# Patient Record
Sex: Female | Born: 1965 | Race: Black or African American | Hispanic: No | Marital: Married | Smoking: Current some day smoker
Health system: Southern US, Community
[De-identification: ages and names within clinical notes are randomized; demographics above are authoritative.]

## PROBLEM LIST (undated history)

## (undated) DIAGNOSIS — E559 Vitamin D deficiency, unspecified: Secondary | ICD-10-CM

## (undated) DIAGNOSIS — F32A Depression, unspecified: Secondary | ICD-10-CM

## (undated) DIAGNOSIS — T7840XA Allergy, unspecified, initial encounter: Secondary | ICD-10-CM

## (undated) DIAGNOSIS — F419 Anxiety disorder, unspecified: Secondary | ICD-10-CM

## (undated) DIAGNOSIS — G43909 Migraine, unspecified, not intractable, without status migrainosus: Secondary | ICD-10-CM

## (undated) DIAGNOSIS — N029 Recurrent and persistent hematuria with unspecified morphologic changes: Secondary | ICD-10-CM

## (undated) DIAGNOSIS — J45909 Unspecified asthma, uncomplicated: Secondary | ICD-10-CM

## (undated) DIAGNOSIS — E785 Hyperlipidemia, unspecified: Secondary | ICD-10-CM

## (undated) DIAGNOSIS — I639 Cerebral infarction, unspecified: Secondary | ICD-10-CM

## (undated) HISTORY — PX: TONSILECTOMY/ADENOIDECTOMY WITH MYRINGOTOMY: SHX6125

## (undated) HISTORY — DX: Allergy, unspecified, initial encounter: T78.40XA

## (undated) HISTORY — DX: Migraine, unspecified, not intractable, without status migrainosus: G43.909

## (undated) HISTORY — DX: Anxiety disorder, unspecified: F41.9

## (undated) HISTORY — DX: Recurrent and persistent hematuria with unspecified morphologic changes: N02.9

## (undated) HISTORY — DX: Unspecified asthma, uncomplicated: J45.909

## (undated) HISTORY — DX: Vitamin D deficiency, unspecified: E55.9

## (undated) HISTORY — DX: Hyperlipidemia, unspecified: E78.5

## (undated) HISTORY — PX: POLYPECTOMY: SHX149

## (undated) HISTORY — PX: LEEP: SHX91

## (undated) HISTORY — DX: Depression, unspecified: F32.A

## (undated) HISTORY — DX: Cerebral infarction, unspecified: I63.9

---

## 1996-07-25 HISTORY — PX: ABDOMINAL HYSTERECTOMY: SHX81

## 2009-07-25 DIAGNOSIS — N029 Recurrent and persistent hematuria with unspecified morphologic changes: Secondary | ICD-10-CM

## 2009-07-25 HISTORY — DX: Recurrent and persistent hematuria with unspecified morphologic changes: N02.9

## 2010-04-23 ENCOUNTER — Other Ambulatory Visit: Admission: RE | Admit: 2010-04-23 | Discharge: 2010-04-23 | Payer: Self-pay | Admitting: Internal Medicine

## 2010-05-25 ENCOUNTER — Ambulatory Visit (HOSPITAL_COMMUNITY): Admission: RE | Admit: 2010-05-25 | Discharge: 2010-05-25 | Payer: Self-pay | Admitting: Internal Medicine

## 2010-05-25 IMAGING — MG MM DIGITAL SCREENING BILAT W/ CAD
4 series · 4 of 4 positions shown · non-contrast
Comparison: none

DG SCREEN MAMMOGRAM BILATERAL
Bilateral CC and MLO view(s) were taken.
Technologist: PETER.(PETER)(PETER)

DIGITAL SCREENING MAMMOGRAM WITH CAD:
There are scattered fibroglandular densities.  Possible abnormality is noted in the right axilla 
and palpable abnormality noted on screening exam.  Spot compression views and possibly sonography 
are recommended for further evaluation.  In the left breast, no masses or malignant type 
calcifications are identified.
Images were processed with CAD.

[R CC]
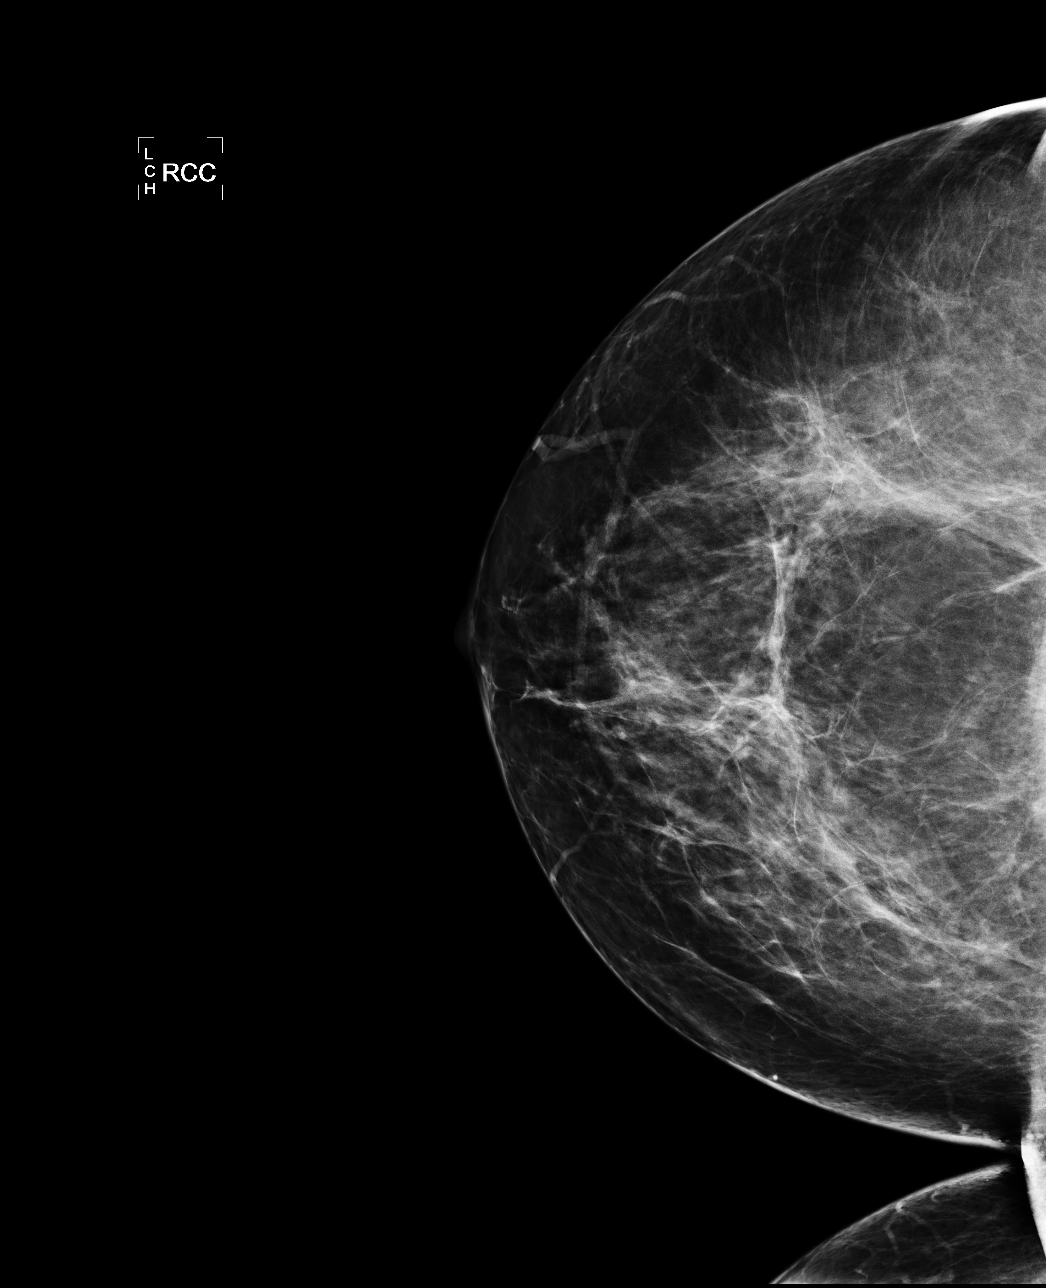

[R MLO]
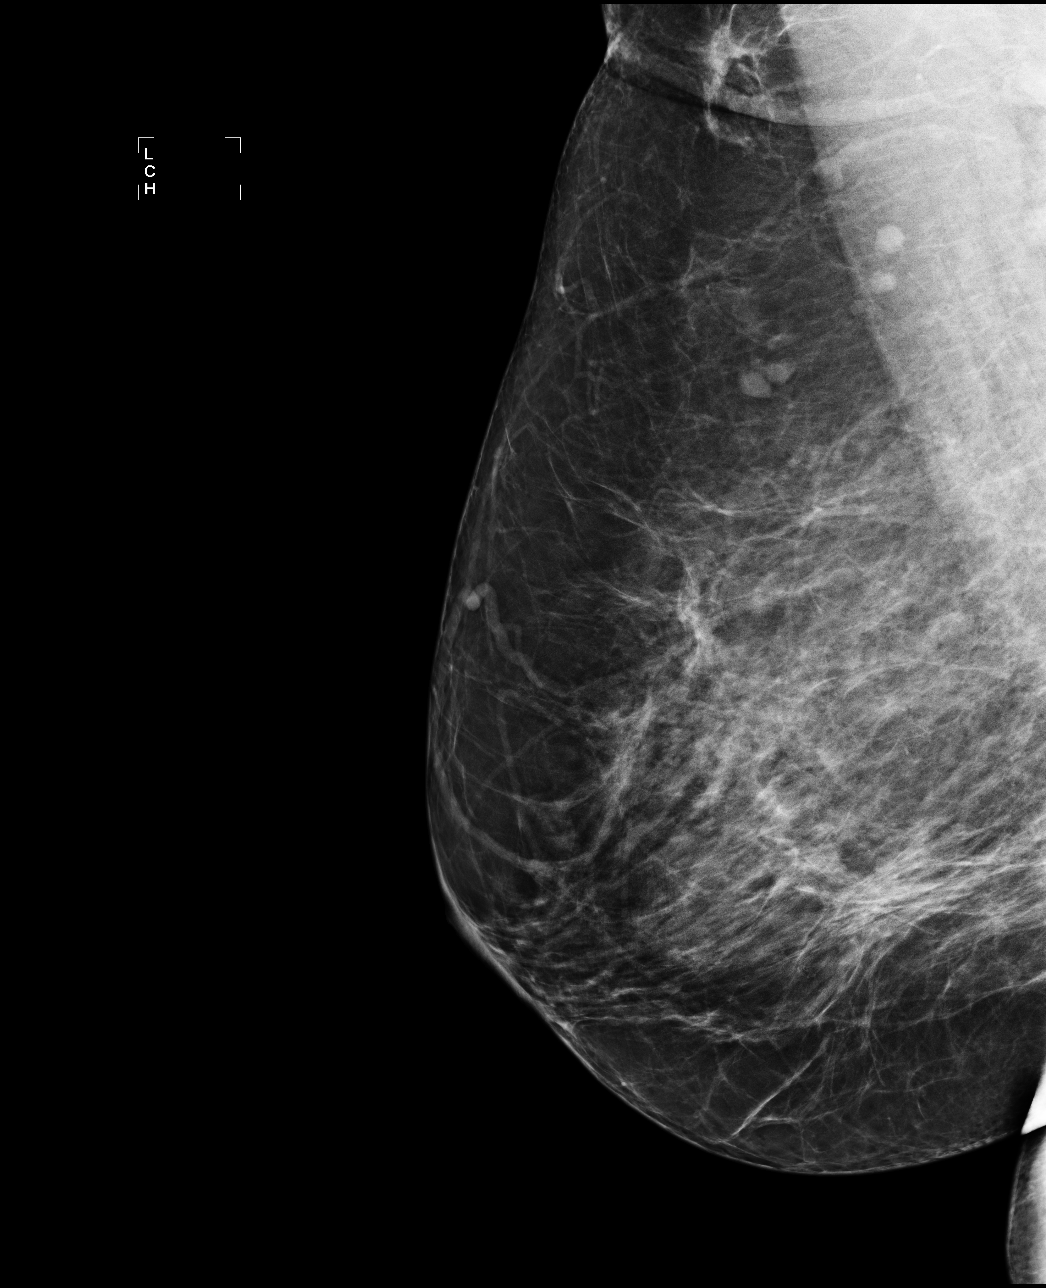

[L CC]
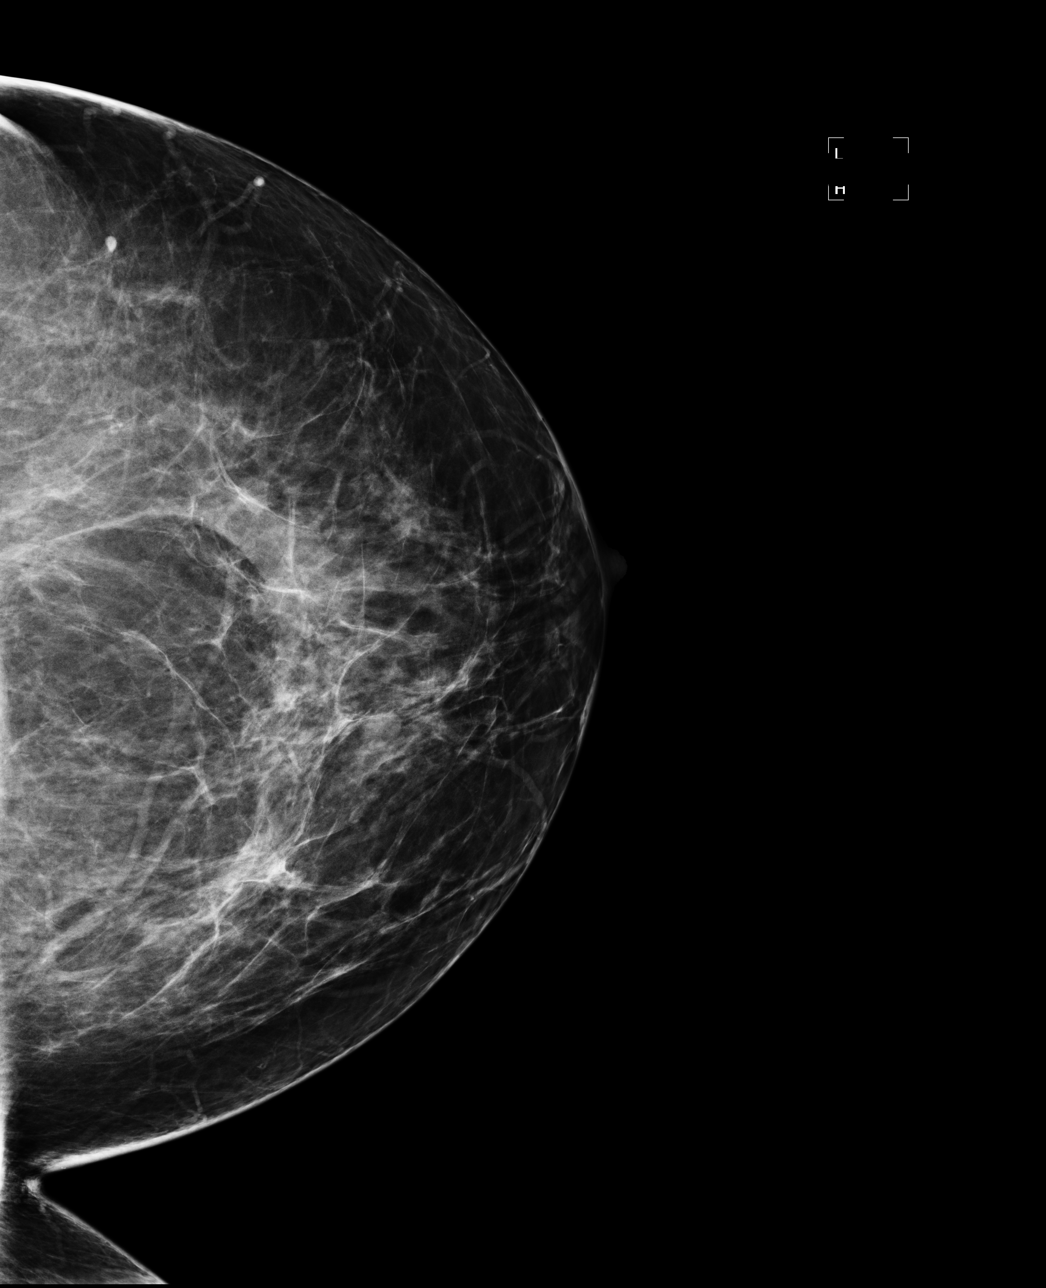

[L MLO]
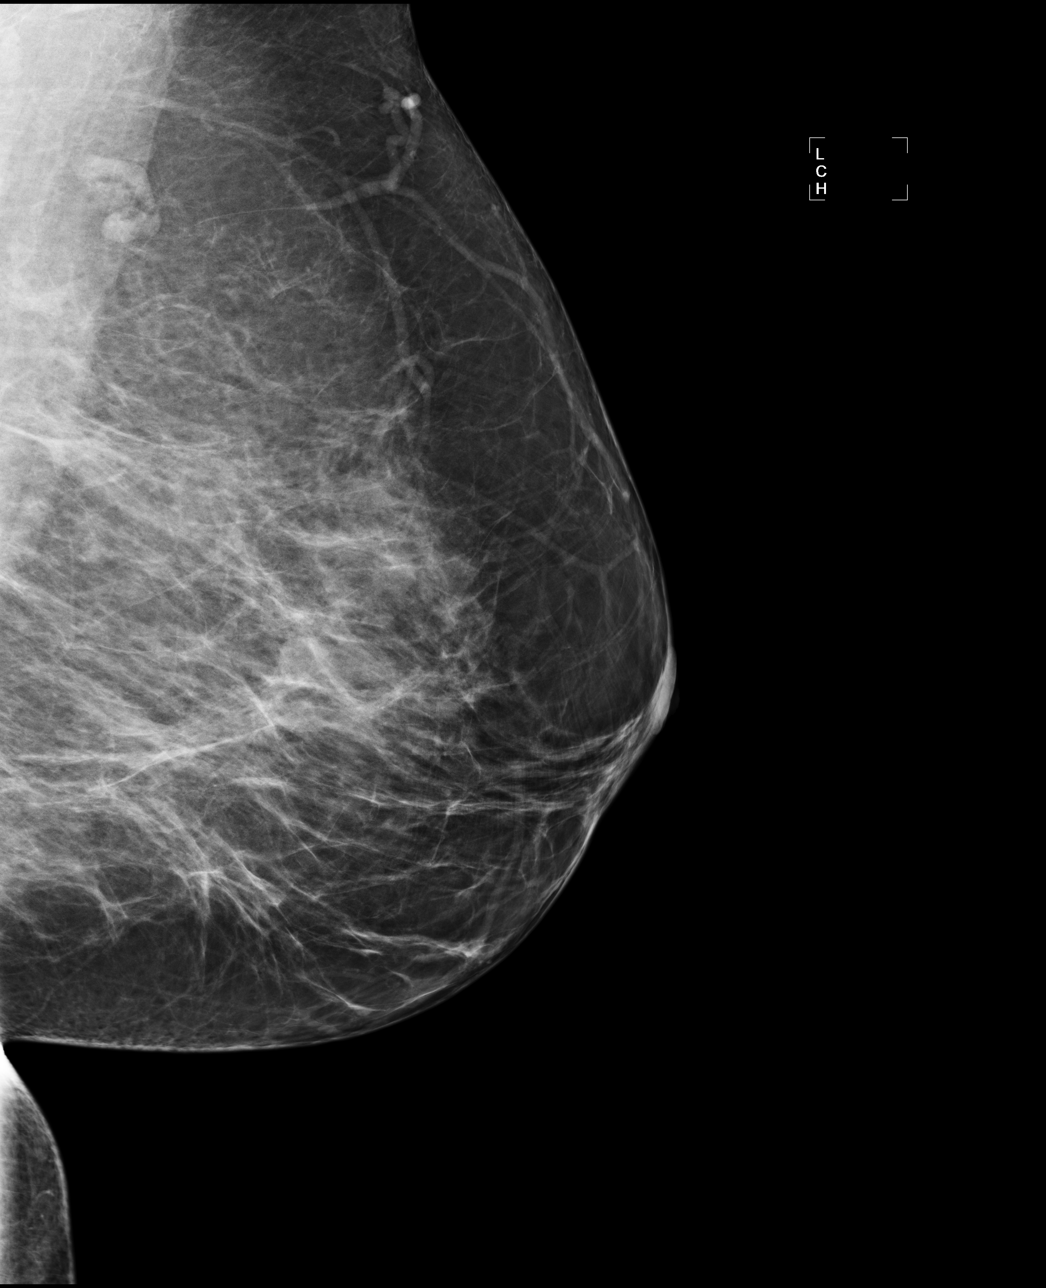

[4 of 4 positions shown; findings below may reference images not displayed]

IMPRESSION: Possible palpable abnormality in the right axilla.  Additional evaluation is indicated.  The 
patient will be contacted for additional studies and a supplementary report will follow.  No 
specific mammographic evidence of malignancy, left breast.

ASSESSMENT: Need additional imaging evaluation and/or prior mammograms for comparison - BI-RADS 0

Further imaging of the right breast.
,

## 2010-06-09 ENCOUNTER — Encounter: Admission: RE | Admit: 2010-06-09 | Discharge: 2010-06-09 | Payer: Self-pay | Admitting: Internal Medicine

## 2010-06-09 IMAGING — MG MM DIGITAL DIAGNOSTIC LIMITED*R*
1 series · 1 of 1 positions shown · non-contrast
Comparison: [DATE]

CLINICAL DATA: Screening call back, right axillary mass.  The
patient states she has had fullness and her right axilla for
multiple years.  She says the area becomes lumpy times.  She
reports no lump is palpated today.

DIGITAL DIAGNOSTIC RIGHT MAMMOGRAM WITHOUT CAD AND RIGHT BREAST
ULTRASOUND:

[R TAN]
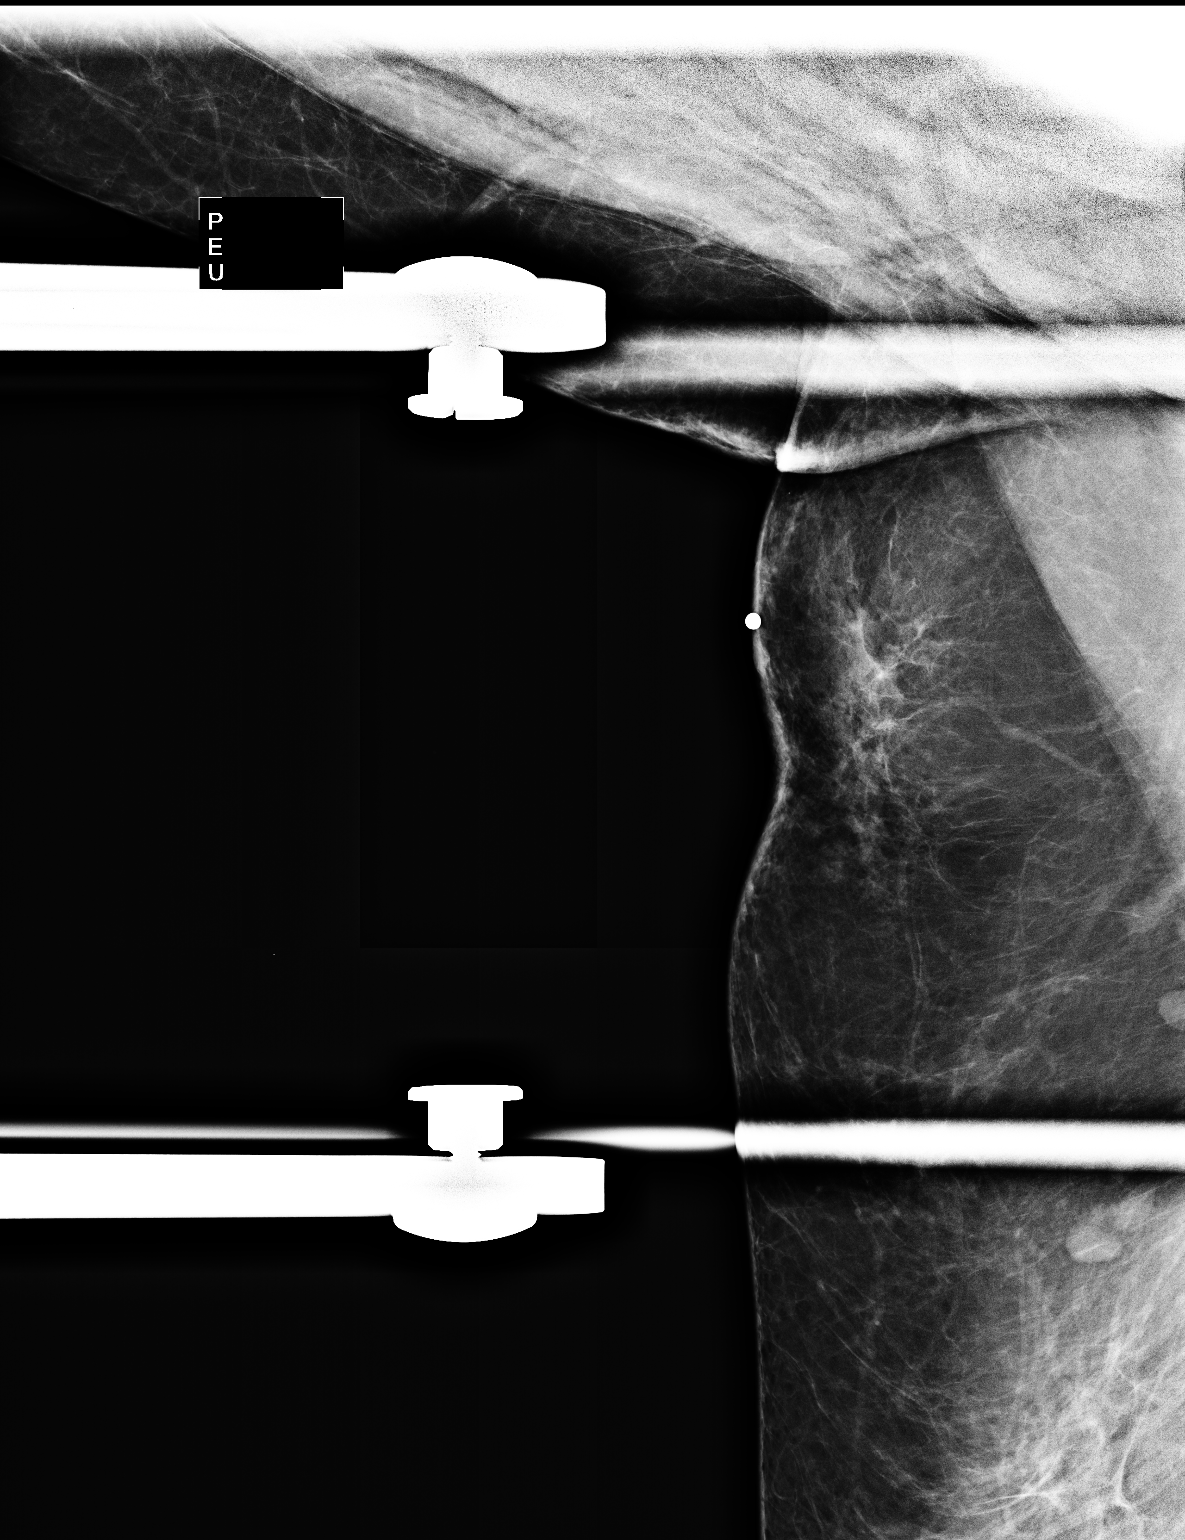

[1 of 1 positions shown; findings below may reference images not displayed]

FINDINGS: Spot compression views in the right axilla confirm the
presence of glandular tissue.  No mass is seen.

On physical exam, I see fullness in the right axilla.  No mass is
palpated in the right axilla.

Ultrasound is performed, showing accessory glandular tissue.
IMPRESSION: Accessory glandular tissue, right axilla.  Recommend routine
screening mammography in 1 year.

BI-RADS CATEGORY 2:  Benign finding(s).

## 2010-06-24 LAB — FECAL OCCULT BLOOD, GUAIAC: Fecal Occult Blood: NEGATIVE

## 2012-04-11 ENCOUNTER — Encounter (HOSPITAL_COMMUNITY): Payer: Self-pay | Admitting: Emergency Medicine

## 2012-04-11 ENCOUNTER — Emergency Department (HOSPITAL_COMMUNITY): Payer: No Typology Code available for payment source

## 2012-04-11 ENCOUNTER — Emergency Department (HOSPITAL_COMMUNITY)
Admission: EM | Admit: 2012-04-11 | Discharge: 2012-04-11 | Disposition: A | Discharge status: Home / Self Care | Payer: No Typology Code available for payment source | Attending: Emergency Medicine | Admitting: Emergency Medicine

## 2012-04-11 DIAGNOSIS — M542 Cervicalgia: Secondary | ICD-10-CM | POA: Insufficient documentation

## 2012-04-11 DIAGNOSIS — S0990XA Unspecified injury of head, initial encounter: Secondary | ICD-10-CM | POA: Insufficient documentation

## 2012-04-11 DIAGNOSIS — Y92410 Unspecified street and highway as the place of occurrence of the external cause: Secondary | ICD-10-CM | POA: Insufficient documentation
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
## 2012-04-11 MED ORDER — IBUPROFEN 800 MG PO TABS: 800.0000 mg | ORAL_TABLET | Freq: Three times a day (TID) | ORAL | Status: DC

## 2012-04-11 MED ORDER — IBUPROFEN 800 MG PO TABS
800.0000 mg | ORAL_TABLET | Freq: Once | ORAL | Status: AC
  Administered 2012-04-11: 800 mg via ORAL
  Filled 2012-04-11: qty 1
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
## 2012-04-11 NOTE — ED Notes (Signed)
Pt taken off backboard with MD, 2 RNs, and 2 NTs.
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
## 2012-04-11 NOTE — ED Notes (Signed)
Bed:WA12<BR> Expected date:<BR> Expected time:<BR> Means of arrival:<BR> Comments:<BR> ems 

## 2012-04-11 NOTE — ED Notes (Signed)
MD at bedside. Collar removed.

## 2012-06-14 ENCOUNTER — Other Ambulatory Visit (HOSPITAL_COMMUNITY)
Admission: RE | Admit: 2012-06-14 | Discharge: 2012-06-14 | Disposition: A | Discharge status: Home / Self Care | Payer: BC Managed Care – PPO | Source: Ambulatory Visit | Attending: Internal Medicine | Admitting: Internal Medicine

## 2012-06-14 ENCOUNTER — Other Ambulatory Visit: Payer: Self-pay | Admitting: Emergency Medicine

## 2012-06-14 DIAGNOSIS — Z01419 Encounter for gynecological examination (general) (routine) without abnormal findings: Secondary | ICD-10-CM | POA: Insufficient documentation

## 2013-05-27 ENCOUNTER — Other Ambulatory Visit: Payer: Self-pay | Admitting: Internal Medicine

## 2013-05-27 LAB — HEPATIC FUNCTION PANEL
AST: 12 U/L (ref 0–37)
Alkaline Phosphatase: 89 U/L (ref 39–117)
Bilirubin, Direct: 0.1 mg/dL (ref 0.0–0.3)
Indirect Bilirubin: 0.5 mg/dL (ref 0.0–0.9)

## 2013-05-27 LAB — CBC WITH DIFFERENTIAL/PLATELET
Eosinophils Absolute: 0.2 10*3/uL (ref 0.0–0.7)
Eosinophils Relative: 2 % (ref 0–5)
HCT: 39.9 % (ref 36.0–46.0)
Hemoglobin: 13.5 g/dL (ref 12.0–15.0)
Lymphs Abs: 3.8 10*3/uL (ref 0.7–4.0)
MCH: 28.6 pg (ref 26.0–34.0)
MCV: 84.5 fL (ref 78.0–100.0)
Monocytes Relative: 9 % (ref 3–12)
Platelets: 262 10*3/uL (ref 150–400)
RBC: 4.72 MIL/uL (ref 3.87–5.11)

## 2013-05-27 LAB — BASIC METABOLIC PANEL WITH GFR
CO2: 25 mEq/L (ref 19–32)
Calcium: 8.9 mg/dL (ref 8.4–10.5)
Creat: 0.65 mg/dL (ref 0.50–1.10)
GFR, Est Non African American: >89 mL/min
Glucose, Bld: 78 mg/dL (ref 70–99)
Potassium: 3.7 mEq/L (ref 3.5–5.3)

## 2013-05-27 LAB — LIPID PANEL
LDL Cholesterol: 151 mg/dL — ABNORMAL HIGH (ref 0–99)
VLDL: 16 mg/dL (ref 0–40)

## 2013-06-16 ENCOUNTER — Encounter: Payer: Self-pay | Admitting: Internal Medicine

## 2013-06-16 DIAGNOSIS — E559 Vitamin D deficiency, unspecified: Secondary | ICD-10-CM | POA: Insufficient documentation

## 2013-06-16 DIAGNOSIS — E785 Hyperlipidemia, unspecified: Secondary | ICD-10-CM | POA: Insufficient documentation

## 2013-06-16 DIAGNOSIS — G43909 Migraine, unspecified, not intractable, without status migrainosus: Secondary | ICD-10-CM | POA: Insufficient documentation

## 2013-06-16 DIAGNOSIS — F419 Anxiety disorder, unspecified: Secondary | ICD-10-CM | POA: Insufficient documentation

## 2013-06-16 DIAGNOSIS — J45909 Unspecified asthma, uncomplicated: Secondary | ICD-10-CM | POA: Insufficient documentation

## 2013-06-17 ENCOUNTER — Encounter: Payer: Self-pay | Admitting: Emergency Medicine

## 2013-08-19 ENCOUNTER — Other Ambulatory Visit: Payer: Self-pay | Admitting: Physician Assistant

## 2013-08-19 ENCOUNTER — Other Ambulatory Visit: Payer: Self-pay | Admitting: Emergency Medicine

## 2013-08-27 ENCOUNTER — Encounter: Payer: Self-pay | Admitting: Emergency Medicine

## 2013-08-27 ENCOUNTER — Ambulatory Visit (INDEPENDENT_AMBULATORY_CARE_PROVIDER_SITE_OTHER): Payer: BC Managed Care – PPO | Admitting: Emergency Medicine

## 2013-08-27 VITALS — BP 122/78 | HR 72 | Temp 98.0°F | Resp 18 | Ht 64.0 in | Wt 170.0 lb

## 2013-08-27 DIAGNOSIS — Z111 Encounter for screening for respiratory tuberculosis: Secondary | ICD-10-CM

## 2013-08-27 DIAGNOSIS — R5381 Other malaise: Secondary | ICD-10-CM

## 2013-08-27 DIAGNOSIS — Z0000 Encounter for general adult medical examination without abnormal findings: Secondary | ICD-10-CM

## 2013-08-27 DIAGNOSIS — R5383 Other fatigue: Secondary | ICD-10-CM

## 2013-08-27 DIAGNOSIS — Z79899 Other long term (current) drug therapy: Secondary | ICD-10-CM

## 2013-08-27 DIAGNOSIS — Z1212 Encounter for screening for malignant neoplasm of rectum: Secondary | ICD-10-CM

## 2013-08-27 DIAGNOSIS — Z23 Encounter for immunization: Secondary | ICD-10-CM

## 2013-08-27 DIAGNOSIS — E559 Vitamin D deficiency, unspecified: Secondary | ICD-10-CM

## 2013-08-27 LAB — HEMOGLOBIN A1C
Hgb A1c MFr Bld: 5.5 % (ref <5.7)
Mean Plasma Glucose: 111 mg/dL (ref <117)

## 2013-08-27 LAB — CBC WITH DIFFERENTIAL/PLATELET
Basophils Absolute: 0 10*3/uL (ref 0.0–0.1)
Basophils Relative: 1 % (ref 0–1)
EOS PCT: 1 % (ref 0–5)
Eosinophils Absolute: 0.1 10*3/uL (ref 0.0–0.7)
HEMATOCRIT: 40 % (ref 36.0–46.0)
Hemoglobin: 13.8 g/dL (ref 12.0–15.0)
LYMPHS ABS: 2 10*3/uL (ref 0.7–4.0)
Lymphocytes Relative: 32 % (ref 12–46)
MCH: 29 pg (ref 26.0–34.0)
MCHC: 34.5 g/dL (ref 30.0–36.0)
MCV: 84 fL (ref 78.0–100.0)
MONO ABS: 0.7 10*3/uL (ref 0.1–1.0)
Monocytes Relative: 11 % (ref 3–12)
Neutro Abs: 3.5 10*3/uL (ref 1.7–7.7)
Neutrophils Relative %: 55 % (ref 43–77)
PLATELETS: 243 10*3/uL (ref 150–400)
RBC: 4.76 MIL/uL (ref 3.87–5.11)
RDW: 13 % (ref 11.5–15.5)
WBC: 6.4 10*3/uL (ref 4.0–10.5)

## 2013-08-27 MED ORDER — ALPRAZOLAM 0.25 MG PO TABS: 0.2500 mg | ORAL_TABLET | Freq: Two times a day (BID) | ORAL | Status: DC | PRN

## 2013-08-27 NOTE — Patient Instructions (Addendum)
Cough, Adult  A cough is a reflex. It helps you clear your throat and airways. A cough can help heal your body. A cough can last 2 or 3 weeks (acute) or may last more than 8 weeks (chronic). Some common causes of a cough can include an infection, allergy, or a cold. HOME CARE  Only take medicine as told by your doctor.  If given, take your medicines (antibiotics) as told. Finish them even if you start to feel better.  Use a cold steam vaporizer or humidier in your home. This can help loosen thick spit (secretions).  Sleep so you are almost sitting up (semi-upright). Use pillows to do this. This helps reduce coughing.  Rest as needed.  Stop smoking if you smoke. GET HELP RIGHT AWAY IF:  You have yellowish-white fluid (pus) in your thick spit.  Your cough gets worse.  Your medicine does not reduce coughing, and you are losing sleep.  You cough up blood.  You have trouble breathing.  Your pain gets worse and medicine does not help.  You have a fever. MAKE SURE YOU:   Understand these instructions.  Will watch your condition.  Will get help right away if you are not doing well or get worse. Document Released: 03/24/2011 Document Revised: 10/03/2011 Document Reviewed: 03/24/2011 [facility] Patient Information 2014 [place].  Pneumococcal Vaccine, Polyvalent solution for injection What is this medicine? PNEUMOCOCCAL VACCINE, POLYVALENT (NEU mo KOK al vak SEEN, pol ee VEY luhnt) is a vaccine to prevent pneumococcus bacteria infection. These bacteria are a major cause of ear infections, Strep throat infections, and serious pneumonia, meningitis, or blood infections worldwide. These vaccines help the body to produce antibodies (protective substances) that help your body defend against these bacteria. This vaccine is recommended for people 13 years of age and older with health problems. It is also recommended for all adults over 51 years old. This vaccine will not treat an  infection. This medicine may be used for other purposes; ask your health care provider or pharmacist if you have questions. COMMON BRAND NAME(S): Pneumovax 23 What should I tell my health care provider before I take this medicine? They need to know if you have any of these conditions: -bleeding problems -bone marrow or organ transplant -cancer, Hodgkin's disease -fever -infection -immune system problems -low platelet count in the blood -seizures -an unusual or allergic reaction to pneumococcal vaccine, diphtheria toxoid, other vaccines, latex, other medicines, foods, dyes, or preservatives -pregnant or trying to get pregnant -breast-feeding How should I use this medicine? This vaccine is for injection into a muscle or under the skin. It is given by a health care professional. A copy of Vaccine Information Statements will be given before each vaccination. Read this sheet carefully each time. The sheet may change frequently. Talk to your pediatrician regarding the use of this medicine in children. While this drug may be prescribed for children as young as 21 years of age for selected conditions, precautions do apply. Overdosage: If you think you have taken too much of this medicine contact a poison control center or emergency room at once. NOTE: This medicine is only for you. Do not share this medicine with others. What if I miss a dose? It is important not to miss your dose. Call your doctor or health care professional if you are unable to keep an appointment. What may interact with this medicine? -medicines for cancer chemotherapy -medicines that suppress your immune function -medicines that treat or prevent blood clots like  warfarin, enoxaparin, and dalteparin -steroid medicines like prednisone or cortisone This list may not describe all possible interactions. Give your health care provider a list of all the medicines, herbs, non-prescription drugs, or dietary supplements you use. Also  tell them if you smoke, drink alcohol, or use illegal drugs. Some items may interact with your medicine. What should I watch for while using this medicine? Mild fever and pain should go away in 3 days or less. Report any unusual symptoms to your doctor or health care professional. What side effects may I notice from receiving this medicine? Side effects that you should report to your doctor or health care professional as soon as possible: -allergic reactions like skin rash, itching or hives, swelling of the face, lips, or tongue -breathing problems -confused -fever over 102 degrees F -pain, tingling, numbness in the hands or feet -seizures -unusual bleeding or bruising -unusual muscle weakness Side effects that usually do not require medical attention (report to your doctor or health care professional if they continue or are bothersome): -aches and pains -diarrhea -fever of 102 degrees F or less -headache -irritable -loss of appetite -pain, tender at site where injected -trouble sleeping This list may not describe all possible side effects. Call your doctor for medical advice about side effects. You may report side effects to FDA at 1-800-FDA-1088. Where should I keep my medicine? This does not apply. This vaccine is given in a clinic, pharmacy, doctor's office, or other health care setting and will not be stored at home. NOTE: This sheet is a summary. It may not cover all possible information. If you have questions about this medicine, talk to your doctor, pharmacist, or health care provider.  2014, [employer]vier/Gold Standard. (2008-02-15 14:32:37) Insomnia Insomnia is frequent trouble falling and/or staying asleep. Insomnia can be a long term problem or a short term problem. Both are common. Insomnia can be a short term problem when the wakefulness is related to a certain stress or worry. Long term insomnia is often related to ongoing stress during waking hours and/or poor sleeping habits.  Overtime, sleep deprivation itself can make the problem worse. Every little thing feels more severe because you are overtired and your ability to cope is decreased. CAUSES   Stress, anxiety, and depression.  Poor sleeping habits.  Distractions such as TV in the bedroom.  Naps close to bedtime.  Engaging in emotionally charged conversations before bed.  Technical reading before sleep.  Alcohol and other sedatives. They may make the problem worse. They can hurt normal sleep patterns and normal dream activity.  Stimulants such as caffeine for several hours prior to bedtime.  Pain syndromes and shortness of breath can cause insomnia.  Exercise late at night.  Changing time zones may cause sleeping problems (jet lag). It is sometimes helpful to have someone observe your sleeping patterns. They should look for periods of not breathing during the night (sleep apnea). They should also look to see how long those periods last. If you live alone or observers are uncertain, you can also be observed at a sleep clinic where your sleep patterns will be professionally monitored. Sleep apnea requires a checkup and treatment. Give your caregivers your medical history. Give your caregivers observations your family has made about your sleep.  SYMPTOMS   Not feeling rested in the morning.  Anxiety and restlessness at bedtime.  Difficulty falling and staying asleep. TREATMENT   Your caregiver may prescribe treatment for an underlying medical disorders. Your caregiver can give advice or  help if you are using alcohol or other drugs for self-medication. Treatment of underlying problems will usually eliminate insomnia problems.  Medications can be prescribed for short time use. They are generally not recommended for lengthy use.  Over-the-counter sleep medicines are not recommended for lengthy use. They can be habit forming.  You can promote easier sleeping by making lifestyle changes such as:  Using  relaxation techniques that help with breathing and reduce muscle tension.  Exercising earlier in the day.  Changing your diet and the time of your last meal. No night time snacks.  Establish a regular time to go to bed.  Counseling can help with stressful problems and worry.  Soothing music and white noise may be helpful if there are background noises you cannot remove.  Stop tedious detailed work at least one hour before bedtime. HOME CARE INSTRUCTIONS   Keep a diary. Inform your caregiver about your progress. This includes any medication side effects. See your caregiver regularly. Take note of:  Times when you are asleep.  Times when you are awake during the night.  The quality of your sleep.  How you feel the next day. This information will help your caregiver care for you.  Get out of bed if you are still awake after 15 minutes. Read or do some quiet activity. Keep the lights down. Wait until you feel sleepy and go back to bed.  Keep regular sleeping and waking hours. Avoid naps.  Exercise regularly.  Avoid distractions at bedtime. Distractions include watching television or engaging in any intense or detailed activity like attempting to balance the household checkbook.  Develop a bedtime ritual. Keep a familiar routine of bathing, brushing your teeth, climbing into bed at the same time each night, listening to soothing music. Routines increase the success of falling to sleep faster.  Use relaxation techniques. This can be using breathing and muscle tension release routines. It can also include visualizing peaceful scenes. You can also help control troubling or intruding thoughts by keeping your mind occupied with boring or repetitive thoughts like the old concept of counting sheep. You can make it more creative like imagining planting one beautiful flower after another in your backyard garden.  During your day, work to eliminate stress. When this is not possible use some of  the previous suggestions to help reduce the anxiety that accompanies stressful situations. MAKE SURE YOU:   Understand these instructions.  Will watch your condition.  Will get help right away if you are not doing well or get worse. Document Released: 07/08/2000 Document Revised: 10/03/2011 Document Reviewed: 08/08/2007 [facility] Patient Information 2014 [place].

## 2013-08-27 NOTE — Progress Notes (Signed)
Subjective:    Patient ID: Kimberly Zhang, female    DOB: 01/27/1966, 48 y.o.   MRN: [id number]  HPI Comments: 48 yo female CPE She is eating healthy, she is eating small portions. She is active. She has been trying to lose weight with short trial of Phentermine x 3 months but has not seen a lot of success. 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 She has had negative workup per Dr.Ottelin in past for Hematuria  And denies any current symptoms.   She has had dry cough x couple weeks with sick babies. She notes mild drainage all clear. She has not tried any OTC.  She is not sleeping well. She wakes 3a.m. and cannot go back to sleep. She notes this has been happening for a long time. She notes mild fatigue and thinks this is the reason. She notes migraines have been better with Topamax and does not think they come from lack of sleep.   She notes anxiety is controlled even though she is mildly stress. She only uses Xanax PRN.   Hyperlipidemia  Anxiety Symptoms include nervous/anxious behavior.     Current Outpatient Prescriptions on File Prior to Visit  Medication Sig Dispense Refill  . Biotin 10 MG CAPS Take by mouth daily.      . Magnesium 500 MG CAPS Take by mouth daily.      . phentermine 37.5 MG capsule Take 37.5 mg by mouth daily.      . rosuvastatin (CRESTOR) 10 MG tablet Take 10 mg by mouth daily. Takes 1/2 daily = 5 mg      . SUMAtriptan (IMITREX) 25 MG tablet Take 25 mg by mouth every 2 (two) hours as needed for migraine or headache. May repeat in 2 hours if headache persists or recurs.      . topiramate (TOPAMAX) 25 MG capsule Take 25 mg by mouth 2 (two) times daily.      Marland Kitchen topiramate (TOPAMAX) 25 MG tablet TAKE 1 TABLET BY MOUTH TWICE DAILY  180 tablet  1  . Vitamin D,  Cholecalciferol, 1000 UNITS TABS Take by mouth daily.      Marland Kitchen albuterol (PROVENTIL HFA;VENTOLIN HFA) 108 (90 BASE) MCG/ACT inhaler Inhale 2 puffs into the lungs every 6 (six) hours as needed.      Marland Kitchen ibuprofen (ADVIL,MOTRIN) 200 MG tablet Take 600 mg by mouth every 6 (six) hours as needed. For pain      . ibuprofen (ADVIL,MOTRIN) 800 MG tablet Take 1 tablet (800 mg total) by mouth 3 (three) times daily.  21 tablet  0  . Multiple Vitamin (MULTIVITAMIN WITH MINERALS) TABS Take 1 tablet by mouth daily.       No current facility-administered medications on file prior to visit.   Allergies  Allergen Reactions  . Shellfish Allergy Anaphylaxis   Past Medical History  Diagnosis Date  . Hyperlipidemia   . Anxiety   . Asthma   . Migraine   . Vitamin D deficiency    Past Surgical History  Procedure Laterality Date  . Leep    . Abdominal hysterectomy  1998  . Tonsilectomy/adenoidectomy with myringotomy     History  Substance Use Topics  . Smoking status: Current Some  Day Smoker  . Smokeless tobacco: Not on file  . Alcohol Use: Not on file   Family History  Problem Relation Age of Onset  . Hyperlipidemia Mother   . Diabetes Paternal Grandmother   . Heart disease Maternal Aunt   . Cancer Maternal Aunt   . Cancer Maternal Uncle   . Depression Other   . Alzheimer's disease Other        Review of Systems  Constitutional: Positive for fatigue.  HENT: Positive for postnasal drip.   Eyes:       [facility] 2014 WNL  Respiratory: Positive for cough.   Genitourinary:       Ottelin NEG workup for hematuria in the past 1st PREG 22 1st Menses 13-14 ANB PAP at age 76  Psychiatric/Behavioral: Positive for sleep disturbance. The patient is nervous/anxious.        Counseling [facility] PRN  All other systems reviewed and are negative.   BP 122/78  Pulse 72  Temp(Src) 98 F (36.7 C) (Temporal)  Resp 18  Ht 5\' 4"  (1.626 m)  Wt 170 lb (77.111 kg)  BMI 29.17  kg/m2     Objective:   Physical Exam  Nursing note and vitals reviewed. Constitutional: She is oriented to person, place, and time. She appears well-developed and well-nourished. No distress.  HENT:  Head: Normocephalic and atraumatic.  Right Ear: External ear normal.  Left Ear: External ear normal.  Nose: Nose normal.  Mouth/Throat: Oropharynx is clear and moist. No oropharyngeal exudate.  Cloudy TM's bilaterally    Eyes: Conjunctivae and EOM are normal. Pupils are equal, round, and reactive to light. Right eye exhibits no discharge. Left eye exhibits no discharge. No scleral icterus.  Neck: Normal range of motion. Neck supple. No JVD present. No tracheal deviation present. No thyromegaly present.  Cardiovascular: Normal rate, regular rhythm, normal heart sounds and intact distal pulses.   Pulmonary/Chest: Effort normal and breath sounds normal.  Abdominal: Soft. Bowel sounds are normal. She exhibits no distension and no mass. There is no tenderness. There is no rebound and no guarding.  Genitourinary:  Breasts WNL GYN def til 2016  Musculoskeletal: Normal range of motion. She exhibits no edema and no tenderness.  Lymphadenopathy:    She has no cervical adenopathy.  Neurological: She is alert and oriented to person, place, and time. She has normal reflexes. She exhibits normal muscle tone. Coordination normal.  Skin: Skin is warm and dry. No rash noted. No erythema. No pallor.  Psychiatric: She has a normal mood and affect. Her behavior is normal. Judgment and thought content normal.      EKG WNL NSCSPT    Assessment & Plan:  1. CPE- Update screening labs/ History/ Immunizations/ Testing as needed. Advised healthy diet, QD exercise, increase H20 and continue RX/ Vitamins AD. Advised needs to update mammogram 2. Insomnia vs Fatigue- check labs, increase activity and H2O, sleep hygiene discussed, consider sleep study if no improvement 3. Cholesterol/ Vit D HX- recheck labs 4.  Anxiety- controlled, continue same, advised increase activity level 5. Mildly overweight- D/c Phentermine, continue to improve diet/ exercise 6. Cough vs Allergic rhinitis- Allegra OTC, increase H2o, allergy hygiene explained, check labs

## 2013-08-28 LAB — HEPATIC FUNCTION PANEL
ALT: 14 U/L (ref 0–35)
AST: 13 U/L (ref 0–37)
Albumin: 3.6 g/dL (ref 3.5–5.2)
Alkaline Phosphatase: 79 U/L (ref 39–117)
Bilirubin, Direct: 0.1 mg/dL (ref 0.0–0.3)
Indirect Bilirubin: 0.3 mg/dL (ref 0.2–1.2)
TOTAL PROTEIN: 5.9 g/dL — AB (ref 6.0–8.3)
Total Bilirubin: 0.4 mg/dL (ref 0.2–1.2)

## 2013-08-28 LAB — VITAMIN B12: VITAMIN B 12: 374 pg/mL (ref 211–911)

## 2013-08-28 LAB — LIPID PANEL
CHOLESTEROL: 186 mg/dL (ref 0–200)
HDL: 34 mg/dL — ABNORMAL LOW (ref >39)
LDL Cholesterol: 127 mg/dL — ABNORMAL HIGH (ref 0–99)
TRIGLYCERIDES: 127 mg/dL (ref <150)
Total CHOL/HDL Ratio: 5.5 Ratio
VLDL: 25 mg/dL (ref 0–40)

## 2013-08-28 LAB — FOLATE RBC: RBC FOLATE: 536 ng/mL (ref >280)

## 2013-08-28 LAB — URINALYSIS, ROUTINE W REFLEX MICROSCOPIC
Bilirubin Urine: NEGATIVE
Glucose, UA: NEGATIVE mg/dL
Hgb urine dipstick: NEGATIVE
KETONES UR: NEGATIVE mg/dL
Leukocytes, UA: NEGATIVE
Nitrite: NEGATIVE
Protein, ur: NEGATIVE mg/dL
SPECIFIC GRAVITY, URINE: 1.022 (ref 1.005–1.030)
UROBILINOGEN UA: 1 mg/dL (ref 0.0–1.0)
pH: 8 (ref 5.0–8.0)

## 2013-08-28 LAB — IRON AND TIBC
%SAT: 42 % (ref 20–55)
IRON: 121 ug/dL (ref 42–145)
TIBC: 289 ug/dL (ref 250–470)
UIBC: 168 ug/dL (ref 125–400)

## 2013-08-28 LAB — BASIC METABOLIC PANEL WITH GFR
BUN: 6 mg/dL (ref 6–23)
CHLORIDE: 103 meq/L (ref 96–112)
CO2: 26 meq/L (ref 19–32)
CREATININE: 0.71 mg/dL (ref 0.50–1.10)
Calcium: 8.7 mg/dL (ref 8.4–10.5)
GFR, Est African American: >89 mL/min
GFR, Est Non African American: >89 mL/min
Glucose, Bld: 79 mg/dL (ref 70–99)
Potassium: 3.8 mEq/L (ref 3.5–5.3)
Sodium: 139 mEq/L (ref 135–145)

## 2013-08-28 LAB — MAGNESIUM: Magnesium: 1.8 mg/dL (ref 1.5–2.5)

## 2013-08-28 LAB — VITAMIN D 25 HYDROXY (VIT D DEFICIENCY, FRACTURES): Vit D, 25-Hydroxy: 40 ng/mL (ref 30–89)

## 2013-08-28 LAB — TSH: TSH: 0.77 u[IU]/mL (ref 0.350–4.500)

## 2013-08-28 LAB — MICROALBUMIN / CREATININE URINE RATIO
CREATININE, URINE: 187.1 mg/dL
MICROALB UR: 1.12 mg/dL (ref 0.00–1.89)
Microalb Creat Ratio: 6 mg/g (ref 0.0–30.0)

## 2013-08-28 LAB — INSULIN, FASTING: Insulin fasting, serum: 7 u[IU]/mL (ref 3–28)

## 2013-08-29 ENCOUNTER — Ambulatory Visit: Payer: Self-pay

## 2013-08-29 ENCOUNTER — Ambulatory Visit (INDEPENDENT_AMBULATORY_CARE_PROVIDER_SITE_OTHER): Payer: BC Managed Care – PPO | Admitting: Emergency Medicine

## 2013-08-29 DIAGNOSIS — L0291 Cutaneous abscess, unspecified: Secondary | ICD-10-CM

## 2013-08-29 DIAGNOSIS — L0390 Cellulitis, unspecified: Secondary | ICD-10-CM

## 2013-08-29 MED ORDER — CEFTRIAXONE SODIUM 1 G IJ SOLR
1.0000 g | Freq: Once | INTRAMUSCULAR | Status: AC
  Administered 2013-08-29: 1 g via INTRAMUSCULAR

## 2013-08-29 MED ORDER — DEXAMETHASONE SODIUM PHOSPHATE 100 MG/10ML IJ SOLN
10.0000 mg | Freq: Once | INTRAMUSCULAR | Status: AC
  Administered 2013-08-29: 10 mg via INTRAMUSCULAR

## 2013-08-29 MED ORDER — PROMETHAZINE-CODEINE 6.25-10 MG/5ML PO SYRP
5.0000 mL | ORAL_SOLUTION | Freq: Three times a day (TID) | ORAL | Status: DC | PRN
Start: 2013-08-29 — End: 2014-09-23

## 2013-08-29 MED ORDER — DOXYCYCLINE HYCLATE 100 MG PO TABS: 100.0000 mg | ORAL_TABLET | Freq: Two times a day (BID) | ORAL | Status: DC

## 2013-08-29 NOTE — Patient Instructions (Signed)
Spider Bite Most spider bites do not cause serious problems. HOME CARE  Do not scratch the bite.  Keep the bite clean and dry. Wash the bite with soap and water as told by your doctor.  Put ice on the bite.  Put ice in a plastic bag.  Place a towel between your skin and the bag.  Leave the ice on for 20 minutes. Do this 4 times a day for the first 2 to 3 days or as told by your doctor.  Raise (elevate) the bite above your heart.  Only take medicine as told by your doctor.  If you are given medicines (antibiotics), take them as told. Finish them even if you start to feel better. You may need a tetanus shot if:  You cannot remember when you had your last tetanus shot.  You have never had a tetanus shot.  The bite broke your skin. If you need a tetanus shot and you choose not to have one, you may get tetanus. Sickness from tetanus can be serious. GET HELP RIGHT AWAY IF:  Your bite turns purple.  Your bite gets more puffy (swollen), painful, or red.  You are short of breath or have chest pain.  You have muscle cramps or painful muscle spasms.  You have belly (abdominal) pain.  You feel sick to your stomach (nauseous) or throw up (vomit).  You feel very tired or sleepy.  Your bite is not better after 3 days of treatment. MAKE SURE YOU:  Understand these instructions.  Will watch your condition.  Will get help right away if you are not doing well or get worse. Document Released: 08/13/2010 Document Revised: 10/03/2011 Document Reviewed: 02/09/2011 [facility] Patient Information 2014 [place], [place]. Cellulitis Cellulitis is an infection of the skin and the tissue under the skin. The infected area is usually red and tender. This happens most often in the arms and lower legs. HOME CARE   Take your antibiotic medicine as told. Finish the medicine even if you start to feel better.  Keep the infected arm or leg raised (elevated).  Put a warm cloth on the area up to 4  times per day.  Only take medicines as told by your doctor.  Keep all doctor visits as told. GET HELP RIGHT AWAY IF:   You have a fever.  You feel very sleepy.  You throw up (vomit) or have watery poop (diarrhea).  You feel sick and have muscle aches and pains.  You see red streaks on the skin coming from the infected area.  Your red area gets bigger or turns a dark color.  Your bone or joint under the infected area is painful after the skin heals.  Your infection comes back in the same area or different area.  You have a puffy (swollen) bump in the infected area.  You have new symptoms. MAKE SURE YOU:   Understand these instructions.  Will watch your condition.  Will get help right away if you are not doing well or get worse. Document Released: 12/28/2007 Document Revised: 01/10/2012 Document Reviewed: 09/26/2011 [facility] Patient Information 2014 [place], [place].

## 2013-08-29 NOTE — Progress Notes (Signed)
Patient ID: Kimberly Zhang, female   DOB: 10/20/1965, 48 y.o.   MRN: [id number]  Patient had Pneumovax in right arm yesterday and she was also exposed to sick kid in dirty house. She has been feeling nauseated/ feverish and fatigued since yesterday evening. She noted redness and swelling around vaccine area this morning.   On exam 2x3 inch area red/ warm/ tender/ edema. Away from immunization dime size area appears to have circular ring with 2 small dots in center  Concern for reaction vs spider bite.  Rocephin 1 GM and Dexamethasone 10 mg given/ Doxy 100 BID sent in. w/c if SX increase or ER.  Warm moist compresses to area

## 2013-08-30 LAB — TB SKIN TEST
Induration: 0 mm
TB SKIN TEST: NEGATIVE

## 2013-09-27 ENCOUNTER — Other Ambulatory Visit (INDEPENDENT_AMBULATORY_CARE_PROVIDER_SITE_OTHER): Payer: BC Managed Care – PPO

## 2013-09-27 DIAGNOSIS — Z1212 Encounter for screening for malignant neoplasm of rectum: Secondary | ICD-10-CM

## 2013-09-27 LAB — POC HEMOCCULT BLD/STL (HOME/3-CARD/SCREEN)
Card #3 Fecal Occult Blood, POC: NEGATIVE
FECAL OCCULT BLD: NEGATIVE
Fecal Occult Blood, POC: NEGATIVE
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
## 2014-06-24 ENCOUNTER — Encounter: Payer: Self-pay | Admitting: Emergency Medicine

## 2014-08-27 ENCOUNTER — Encounter: Payer: Self-pay | Admitting: Emergency Medicine

## 2014-09-23 ENCOUNTER — Ambulatory Visit (INDEPENDENT_AMBULATORY_CARE_PROVIDER_SITE_OTHER): Payer: BC Managed Care – PPO | Admitting: Emergency Medicine

## 2014-09-23 ENCOUNTER — Encounter: Payer: Self-pay | Admitting: Emergency Medicine

## 2014-09-23 VITALS — BP 122/84 | HR 68 | Temp 98.0°F | Resp 18 | Ht 63.0 in | Wt 182.0 lb

## 2014-09-23 DIAGNOSIS — R059 Cough, unspecified: Secondary | ICD-10-CM

## 2014-09-23 DIAGNOSIS — Z79899 Other long term (current) drug therapy: Secondary | ICD-10-CM

## 2014-09-23 DIAGNOSIS — Z0000 Encounter for general adult medical examination without abnormal findings: Secondary | ICD-10-CM

## 2014-09-23 DIAGNOSIS — R6889 Other general symptoms and signs: Secondary | ICD-10-CM

## 2014-09-23 DIAGNOSIS — E559 Vitamin D deficiency, unspecified: Secondary | ICD-10-CM

## 2014-09-23 DIAGNOSIS — R05 Cough: Secondary | ICD-10-CM

## 2014-09-23 DIAGNOSIS — E785 Hyperlipidemia, unspecified: Secondary | ICD-10-CM

## 2014-09-23 DIAGNOSIS — Z0001 Encounter for general adult medical examination with abnormal findings: Secondary | ICD-10-CM

## 2014-09-23 DIAGNOSIS — N029 Recurrent and persistent hematuria with unspecified morphologic changes: Secondary | ICD-10-CM | POA: Insufficient documentation

## 2014-09-23 DIAGNOSIS — Z111 Encounter for screening for respiratory tuberculosis: Secondary | ICD-10-CM

## 2014-09-23 DIAGNOSIS — I10 Essential (primary) hypertension: Secondary | ICD-10-CM

## 2014-09-23 DIAGNOSIS — Z1212 Encounter for screening for malignant neoplasm of rectum: Secondary | ICD-10-CM

## 2014-09-23 LAB — CBC WITH DIFFERENTIAL/PLATELET
BASOS ABS: 0 10*3/uL (ref 0.0–0.1)
Basophils Relative: 0 % (ref 0–1)
EOS ABS: 0.2 10*3/uL (ref 0.0–0.7)
EOS PCT: 2 % (ref 0–5)
HCT: 44.6 % (ref 36.0–46.0)
Hemoglobin: 14.6 g/dL (ref 12.0–15.0)
Lymphocytes Relative: 50 % — ABNORMAL HIGH (ref 12–46)
Lymphs Abs: 4.7 10*3/uL — ABNORMAL HIGH (ref 0.7–4.0)
MCH: 28.8 pg (ref 26.0–34.0)
MCHC: 32.7 g/dL (ref 30.0–36.0)
MCV: 88 fL (ref 78.0–100.0)
MPV: 9.1 fL (ref 8.6–12.4)
Monocytes Absolute: 0.8 10*3/uL (ref 0.1–1.0)
Monocytes Relative: 8 % (ref 3–12)
Neutro Abs: 3.8 10*3/uL (ref 1.7–7.7)
Neutrophils Relative %: 40 % — ABNORMAL LOW (ref 43–77)
PLATELETS: 312 10*3/uL (ref 150–400)
RBC: 5.07 MIL/uL (ref 3.87–5.11)
RDW: 13 % (ref 11.5–15.5)
WBC: 9.4 10*3/uL (ref 4.0–10.5)

## 2014-09-23 MED ORDER — TOPIRAMATE 25 MG PO TABS: 25.0000 mg | ORAL_TABLET | Freq: Two times a day (BID) | ORAL | Status: DC

## 2014-09-23 MED ORDER — ALBUTEROL SULFATE HFA 108 (90 BASE) MCG/ACT IN AERS: 2.0000 | INHALATION_SPRAY | Freq: Four times a day (QID) | RESPIRATORY_TRACT | Status: DC | PRN

## 2014-09-23 MED ORDER — ROSUVASTATIN CALCIUM 10 MG PO TABS
10.0000 mg | ORAL_TABLET | Freq: Every day | ORAL | Status: DC
Start: 2014-09-23 — End: 2014-12-05

## 2014-09-23 MED ORDER — ALPRAZOLAM 0.25 MG PO TABS: 0.2500 mg | ORAL_TABLET | Freq: Two times a day (BID) | ORAL | Status: DC | PRN

## 2014-09-23 NOTE — Patient Instructions (Signed)
Fat and Cholesterol Control Diet Fat and cholesterol levels in your blood and organs are influenced by your diet. High levels of fat and cholesterol may lead to diseases of the heart, small and large blood vessels, gallbladder, liver, and pancreas. CONTROLLING FAT AND CHOLESTEROL WITH DIET Although exercise and lifestyle factors are important, your diet is key. That is because certain foods are known to raise cholesterol and others to lower it. The goal is to balance foods for their effect on cholesterol and more importantly, to replace saturated and trans fat with other types of fat, such as monounsaturated fat, polyunsaturated fat, and omega-3 fatty acids. On average, a person should consume no more than 15 to 17 g of saturated fat daily. Saturated and trans fats are considered "bad" fats, and they will raise LDL cholesterol. Saturated fats are primarily found in animal products such as meats, butter, and cream. However, that does not mean you need to give up all your favorite foods. Today, there are good tasting, low-fat, low-cholesterol substitutes for most of the things you like to eat. Choose low-fat or nonfat alternatives. Choose round or loin cuts of red meat. These types of cuts are lowest in fat and cholesterol. Chicken (without the skin), fish, veal, and ground turkey breast are great choices. Eliminate fatty meats, such as hot dogs and salami. Even shellfish have little or no saturated fat. Have a 3 oz (85 g) portion when you eat lean meat, poultry, or fish. Trans fats are also called "partially hydrogenated oils." They are oils that have been scientifically manipulated so that they are solid at room temperature resulting in a longer shelf life and improved taste and texture of foods in which they are added. Trans fats are found in stick margarine, some tub margarines, cookies, crackers, and baked goods.  When baking and cooking, oils are a great substitute for butter. The monounsaturated oils are  especially beneficial since it is believed they lower LDL and raise HDL. The oils you should avoid entirely are saturated tropical oils, such as coconut and palm.  Remember to eat a lot from food groups that are naturally free of saturated and trans fat, including fish, fruit, vegetables, beans, grains (barley, rice, couscous, bulgur wheat), and pasta (without cream sauces).  IDENTIFYING FOODS THAT LOWER FAT AND CHOLESTEROL  Soluble fiber may lower your cholesterol. This type of fiber is found in fruits such as apples, vegetables such as broccoli, potatoes, and carrots, legumes such as beans, peas, and lentils, and grains such as barley. Foods fortified with plant sterols (phytosterol) may also lower cholesterol. You should eat at least 2 g per day of these foods for a cholesterol lowering effect.  Read package labels to identify low-saturated fats, trans fat free, and low-fat foods at the supermarket. Select cheeses that have only 2 to 3 g saturated fat per ounce. Use a heart-healthy tub margarine that is free of trans fats or partially hydrogenated oil. When buying baked goods (cookies, crackers), avoid partially hydrogenated oils. Breads and muffins should be made from whole grains (whole-wheat or whole oat flour, instead of "flour" or "enriched flour"). Buy non-creamy canned soups with reduced salt and no added fats.  FOOD PREPARATION TECHNIQUES  Never deep-fry. If you must fry, either stir-fry, which uses very little fat, or use non-stick cooking sprays. When possible, broil, bake, or roast meats, and steam vegetables. Instead of putting butter or margarine on vegetables, use lemon and herbs, applesauce, and cinnamon (for squash and sweet potatoes). Use nonfat   yogurt, salsa, and low-fat dressings for salads.  LOW-SATURATED FAT / LOW-FAT FOOD SUBSTITUTES Meats / Saturated Fat (g)  Avoid: Steak, marbled (3 oz/85 g) / 11 g  Choose: Steak, lean (3 oz/85 g) / 4 g  Avoid: Hamburger (3 oz/85 g) / 7  g  Choose: Hamburger, lean (3 oz/85 g) / 5 g  Avoid: Ham (3 oz/85 g) / 6 g  Choose: Ham, lean cut (3 oz/85 g) / 2.4 g  Avoid: Chicken, with skin, dark meat (3 oz/85 g) / 4 g  Choose: Chicken, skin removed, dark meat (3 oz/85 g) / 2 g  Avoid: Chicken, with skin, light meat (3 oz/85 g) / 2.5 g  Choose: Chicken, skin removed, light meat (3 oz/85 g) / 1 g Dairy / Saturated Fat (g)  Avoid: Whole milk (1 cup) / 5 g  Choose: Low-fat milk, 2% (1 cup) / 3 g  Choose: Low-fat milk, 1% (1 cup) / 1.5 g  Choose: Skim milk (1 cup) / 0.3 g  Avoid: Hard cheese (1 oz/28 g) / 6 g  Choose: Skim milk cheese (1 oz/28 g) / 2 to 3 g  Avoid: Cottage cheese, 4% fat (1 cup) / 6.5 g  Choose: Low-fat cottage cheese, 1% fat (1 cup) / 1.5 g  Avoid: Ice cream (1 cup) / 9 g  Choose: Sherbet (1 cup) / 2.5 g  Choose: Nonfat frozen yogurt (1 cup) / 0.3 g  Choose: Frozen fruit bar / trace  Avoid: Whipped cream (1 tbs) / 3.5 g  Choose: Nondairy whipped topping (1 tbs) / 1 g Condiments / Saturated Fat (g)  Avoid: Mayonnaise (1 tbs) / 2 g  Choose: Low-fat mayonnaise (1 tbs) / 1 g  Avoid: Butter (1 tbs) / 7 g  Choose: Extra light margarine (1 tbs) / 1 g  Avoid: Coconut oil (1 tbs) / 11.8 g  Choose: Olive oil (1 tbs) / 1.8 g  Choose: Corn oil (1 tbs) / 1.7 g  Choose: Safflower oil (1 tbs) / 1.2 g  Choose: Sunflower oil (1 tbs) / 1.4 g  Choose: Soybean oil (1 tbs) / 2.4 g  Choose: Canola oil (1 tbs) / 1 g Document Released: 07/11/2005 Document Revised: 11/05/2012 Document Reviewed: 10/09/2013 [employer] Patient Information 2015 [place], [place]. This information is not intended to replace advice given to you by your health care provider. Make sure you discuss any questions you have with your health care provider. Tuberculin Skin Test The PPD skin test is a method used to help with the diagnosis of a disease called tuberculosis (TB). HOW THE TEST IS DONE  The test site (usually the forearm)  is cleansed. The PPD extract is then injected under the top layer of skin, causing a blister to form on the skin. The reaction will take 48 - 72 hours to develop. You must return to your health care provider within that time to have the area checked. This will determine whether you have had a significant reaction to the PPD test. A reaction is measured in millimeters of hard swelling (induration) at the site. PREPARATION FOR TEST  There is no special preparation for this test. People with a skin rash or other skin irritations on their arms may need to have the test performed at a different spot on the body. Tell your health care provider if you have ever had a positive PPD skin test. If so, you should not have a repeat PPD test. Tell your doctor if you have a  medical condition or if you take certain drugs, such as steroids, that can affect your immune system. These situations may lead to inaccurate test results. NORMAL FINDINGS A negative reaction (no induration) or a level of hard swelling that falls below a certain cutoff may mean that a person has not been infected with the bacteria that cause TB. There are different cutoffs for children, people with HIV, and other risk groups. Unfortunately, this is not a perfect test, and up to 20% of people infected with tuberculosis may not have a reaction on the PPD skin test. In addition, certain conditions that affect the immune system (cancer, recent chemotherapy, late-stage AIDS) may cause a false-negative test result.  The reaction will take 48 - 72 hours to develop. You must return to your health care provider within that time to have the area checked. Follow your caregiver's instructions as to where and when to report for this to be done. Ranges for normal findings may vary among different laboratories and hospitals. You should always check with your doctor after having lab work or other tests done to discuss the meaning of your test results and whether  your values are considered within normal limits. WHAT ABNORMAL RESULTS MEAN  The results of the test depend on the size of the skin reaction and on the person being tested.  A small reaction (5 mm of hard swelling at the site) is considered to be positive in people who have HIV, who are taking steroid therapy, or who have been in close contact with a person who has active tuberculosis. Larger reactions (greater than or equal to 10 mm) are considered positive in people with diabetes or kidney failure, and in health care workers, among others. In people with no known risks for tuberculosis, a positive reaction requires 15 mm or more of hard swelling at the site. RISKS AND COMPLICATIONS There is a very small risk of severe redness and swelling of the arm in people who have had a previous positive PPD test and who have the test again. There also have been a few rare cases of this reaction in people who have not been tested before. CONSIDERATIONS  A positive skin test does not necessarily mean that a person has active tuberculosis. More tests will be done to check whether active disease is present. Many people who were born outside the [place] may have had a vaccine called "BCG," which can lead to a false-positive test result. MEANING OF TEST  Your caregiver will go over the test results with you and discuss the importance and meaning of your results, as well as treatment options and the need for additional tests if necessary. OBTAINING THE TEST RESULTS It is your responsibility to obtain your test results. Ask the lab or department performing the test when and how you will get your results. Document Released: 04/20/2005 Document Revised: 10/03/2011 Document Reviewed: 10/18/2013 [facility] Patient Information 2015 [place], [place]. This information is not intended to replace advice given to you by your health care provider. Make sure you discuss any questions you have with your health care provider.

## 2014-09-23 NOTE — Progress Notes (Signed)
Subjective:    Patient ID: Kimberly Zhang, female    DOB: 1966/04/10, 49 y.o.   MRN: [id number]  HPI Comments: 49yo AAF CPE and cholesterol follow up. She did not f/u AD for recheck of labs. She has been out of Crestor x several months. She is eating healthier with less meat. She has not been exercising routinely with busy schedule. She has gained weight.  She notes only has asthma flares with weather changes. She does not use maintenance medication.  She notes migraines were improved with TOPAMAX but has been out of prescription x several weeks and HA have increased again. She would like to restart prescription. She notes HA is her "normal".  She has had increased stress with work and with old partner showing up. She is dealing with both and rarely uses xanax but would like refill in case of emergency.  She has history of Benign Hematuria with NEG CT abd/ pelvis but did not f/u for Cystoscopy per Dr Karsten Ro AD. She rarely smokes but does have + tobacco history in past.   She is overdue for mammogram and CXR.  Lab Results      Component                Value               Date                      WBC                      6.4                 08/27/2013                HGB                      13.8                08/27/2013                HCT                      40.0                08/27/2013                PLT                      243                 08/27/2013                GLUCOSE                  79                  08/27/2013                CHOL                     186                 08/27/2013                TRIG                     127  08/27/2013                HDL                      34*                 08/27/2013                LDLCALC                  127*                08/27/2013                ALT                      14                  08/27/2013                AST                      13                  08/27/2013                NA                        139                 08/27/2013                K                        3.8                 08/27/2013                CL                       103                 08/27/2013                CREATININE               0.71                08/27/2013                BUN                      6                   08/27/2013                CO2                      26                  08/27/2013                TSH                      0.770  08/27/2013                HGBA1C                   5.5                 08/27/2013                MICROALBUR               1.12                08/27/2013               Medication List       This list is accurate as of: 09/23/14 11:59 PM.  Always use your most recent med list.               albuterol 108 (90 BASE) MCG/ACT inhaler  Commonly known as:  PROVENTIL HFA;VENTOLIN HFA  Inhale 2 puffs into the lungs every 6 (six) hours as needed.     ALPRAZolam 0.25 MG tablet  Commonly known as:  XANAX  Take 1 tablet (0.25 mg total) by mouth 2 (two) times daily as needed for anxiety.     Biotin 10 MG Caps  Take by mouth daily.     ibuprofen 800 MG tablet  Commonly known as:  ADVIL,MOTRIN  Take 1 tablet (800 mg total) by mouth 3 (three) times daily.     rosuvastatin 10 MG tablet  Commonly known as:  CRESTOR  Take 1 tablet (10 mg total) by mouth daily. Takes 1/2 daily = 5 mg     topiramate 25 MG tablet  Commonly known as:  TOPAMAX  Take 1 tablet (25 mg total) by mouth 2 (two) times daily.       Allergies  Allergen Reactions  . Shellfish Allergy Anaphylaxis   Past Medical History  Diagnosis Date  . Hyperlipidemia   . Anxiety   . Asthma   . Migraine   . Vitamin D deficiency   . Benign hematuria 2011    Ottelin   Past Surgical History  Procedure Laterality Date  . Leep    . Abdominal hysterectomy  1998  . Tonsilectomy/adenoidectomy with myringotomy     History  Substance Use Topics  . Smoking status: Current Some Day Smoker   . Smokeless tobacco: Not on file  . Alcohol Use: Not on file   Family History  Problem Relation Age of Onset  . Hyperlipidemia Mother   . Diabetes Paternal Grandmother   . Heart disease Maternal Aunt   . Cancer Maternal Aunt   . Cancer Maternal Uncle   . Depression Other   . Alzheimer's disease Other    MAINTENANCE: Colonoscopy:n/a Mammo:OVERDUE BMD:n/a Pap/ Pelvic:2013 WNL due 2018 Manual ZDG:LOVFIEP due Dentist:Q 6 month CXR: Overdue  IMMUNIZATIONS: Tdap:4//1/12 Pneumovax:2015  Zostavax:n/a Influenza:2015 at work  Patient Care Team: Unk Pinto, MD as PCP - General (Internal Medicine) Claybon Jabs, MD as Consulting Physician (Urology) [facility], (Eye)   Review of Systems  Constitutional: Positive for fatigue.  Respiratory: Negative for shortness of breath.   Cardiovascular: Negative for chest pain.  Neurological: Positive for headaches.  Psychiatric/Behavioral: Negative for suicidal ideas. The patient is not nervous/anxious.   All other systems reviewed and are negative.  BP 122/84 mmHg  Pulse 68  Temp(Src) 98 F (36.7 C) (Temporal)  Resp 18  Ht 5\' 3"  (1.6 m)  Wt 182 lb (82.555 kg)  BMI 32.25 kg/m2     Objective:   Physical Exam  Constitutional: She is oriented to person, place, and time. She appears well-developed and well-nourished. No distress.  HENT:  Head: Normocephalic.  Nose: Nose normal.  Mouth/Throat: Oropharynx is clear and moist.  Eyes: Conjunctivae and EOM are normal. Pupils are equal, round, and reactive to light. No scleral icterus.  Neck: Normal range of motion. Neck supple. No JVD present. No tracheal deviation present. No thyromegaly present.  Cardiovascular: Normal rate, regular rhythm, normal heart sounds and intact distal pulses.   Pulmonary/Chest: Effort normal and breath sounds normal.  Abdominal: Soft. Bowel sounds are normal. She exhibits no distension and no mass. There is no tenderness.  Genitourinary:  Breast  WNL GYN-deferred til 2018  Musculoskeletal: Normal range of motion. She exhibits no edema or tenderness.  Lymphadenopathy:    She has no cervical adenopathy.  Neurological: She is alert and oriented to person, place, and time. She has normal reflexes. No cranial nerve deficit. She exhibits normal muscle tone. Coordination normal.  Skin: Skin is warm and dry. No rash noted. No erythema.  Psychiatric: She has a normal mood and affect. Her behavior is normal. Judgment and thought content normal.  Nursing note and vitals reviewed.   EKG NSCSPT WNL     Assessment & Plan:  1. CPE- Update screening labs/ History/ Immunizations/ Testing as needed. Advised healthy diet, QD exercise, increase H20 and continue RX/ Vitamins AD.  2.Cholesterol- recheck labs, Need to eat healthier and exercise AD. Need to refill and restart cholesterol RX  3. Migraine history- Restart Topamax 25 mg QD x 1 week, then increase to BID  4. Anxiety- Controlled currently, continue RX AD w/c if SX increase or ER, recommend counseling if symptoms continue   5. Asthma- controlled without recent flares, continue to monitor  6. PMH Hematuria with tobacco HX- Check labs if moderate hematuria may need referral to Urology since did not have cystoscopy AD in past

## 2014-09-24 LAB — LIPID PANEL
CHOL/HDL RATIO: 8 ratio
CHOLESTEROL: 312 mg/dL — AB (ref 0–200)
HDL: 39 mg/dL — ABNORMAL LOW (ref >=46)
LDL Cholesterol: 237 mg/dL — ABNORMAL HIGH (ref 0–99)
Triglycerides: 180 mg/dL — ABNORMAL HIGH (ref <150)
VLDL: 36 mg/dL (ref 0–40)

## 2014-09-24 LAB — URINALYSIS, ROUTINE W REFLEX MICROSCOPIC
Bilirubin Urine: NEGATIVE
Glucose, UA: NEGATIVE mg/dL
KETONES UR: NEGATIVE mg/dL
Leukocytes, UA: NEGATIVE
NITRITE: NEGATIVE
PH: 6 (ref 5.0–8.0)
Protein, ur: NEGATIVE mg/dL
SPECIFIC GRAVITY, URINE: 1.011 (ref 1.005–1.030)
UROBILINOGEN UA: 0.2 mg/dL (ref 0.0–1.0)

## 2014-09-24 LAB — HEPATIC FUNCTION PANEL
ALK PHOS: 79 U/L (ref 39–117)
ALT: 12 U/L (ref 0–35)
AST: 13 U/L (ref 0–37)
Albumin: 4.1 g/dL (ref 3.5–5.2)
BILIRUBIN TOTAL: 0.3 mg/dL (ref 0.2–1.2)
Bilirubin, Direct: 0.1 mg/dL (ref 0.0–0.3)
Indirect Bilirubin: 0.2 mg/dL (ref 0.2–1.2)
Total Protein: 6.7 g/dL (ref 6.0–8.3)

## 2014-09-24 LAB — VITAMIN D 25 HYDROXY (VIT D DEFICIENCY, FRACTURES): Vit D, 25-Hydroxy: 19 ng/mL — ABNORMAL LOW (ref 30–100)

## 2014-09-24 LAB — BASIC METABOLIC PANEL WITH GFR
BUN: 10 mg/dL (ref 6–23)
CHLORIDE: 103 meq/L (ref 96–112)
CO2: 27 mEq/L (ref 19–32)
CREATININE: 0.64 mg/dL (ref 0.50–1.10)
Calcium: 9.2 mg/dL (ref 8.4–10.5)
GFR, Est African American: >89 mL/min
GFR, Est Non African American: >89 mL/min
Glucose, Bld: 75 mg/dL (ref 70–99)
Potassium: 4.2 mEq/L (ref 3.5–5.3)
Sodium: 140 mEq/L (ref 135–145)

## 2014-09-24 LAB — URINALYSIS, MICROSCOPIC ONLY
CRYSTALS: NONE SEEN
Casts: NONE SEEN

## 2014-09-24 LAB — HEMOGLOBIN A1C
Hgb A1c MFr Bld: 5.6 % (ref <5.7)
Mean Plasma Glucose: 114 mg/dL (ref <117)

## 2014-09-24 LAB — INSULIN, FASTING: Insulin fasting, serum: 3.2 u[IU]/mL (ref 2.0–19.6)

## 2014-09-24 LAB — MAGNESIUM: Magnesium: 1.8 mg/dL (ref 1.5–2.5)

## 2014-09-24 LAB — TSH: TSH: 0.665 u[IU]/mL (ref 0.350–4.500)

## 2014-09-26 LAB — TB SKIN TEST
Induration: 0 mm
TB SKIN TEST: NEGATIVE

## 2014-10-06 ENCOUNTER — Other Ambulatory Visit: Payer: BC Managed Care – PPO

## 2014-10-06 DIAGNOSIS — R319 Hematuria, unspecified: Secondary | ICD-10-CM

## 2014-10-06 DIAGNOSIS — R899 Unspecified abnormal finding in specimens from other organs, systems and tissues: Secondary | ICD-10-CM

## 2014-10-06 LAB — CBC WITH DIFFERENTIAL/PLATELET
BASOS ABS: 0.1 10*3/uL (ref 0.0–0.1)
BASOS PCT: 1 % (ref 0–1)
Eosinophils Absolute: 0.3 10*3/uL (ref 0.0–0.7)
Eosinophils Relative: 3 % (ref 0–5)
HCT: 41.3 % (ref 36.0–46.0)
Hemoglobin: 14.2 g/dL (ref 12.0–15.0)
Lymphocytes Relative: 42 % (ref 12–46)
Lymphs Abs: 3.7 10*3/uL (ref 0.7–4.0)
MCH: 29.8 pg (ref 26.0–34.0)
MCHC: 34.4 g/dL (ref 30.0–36.0)
MCV: 86.6 fL (ref 78.0–100.0)
MPV: 8.7 fL (ref 8.6–12.4)
Monocytes Absolute: 0.9 10*3/uL (ref 0.1–1.0)
Monocytes Relative: 10 % (ref 3–12)
NEUTROS PCT: 44 % (ref 43–77)
Neutro Abs: 3.9 10*3/uL (ref 1.7–7.7)
Platelets: 249 10*3/uL (ref 150–400)
RBC: 4.77 MIL/uL (ref 3.87–5.11)
RDW: 12.8 % (ref 11.5–15.5)
WBC: 8.8 10*3/uL (ref 4.0–10.5)

## 2014-10-06 LAB — URINALYSIS, ROUTINE W REFLEX MICROSCOPIC
BILIRUBIN URINE: NEGATIVE
Glucose, UA: NEGATIVE mg/dL
Ketones, ur: NEGATIVE mg/dL
LEUKOCYTES UA: NEGATIVE
Nitrite: NEGATIVE
PH: 6 (ref 5.0–8.0)
PROTEIN: NEGATIVE mg/dL
SPECIFIC GRAVITY, URINE: 1.012 (ref 1.005–1.030)
Urobilinogen, UA: 0.2 mg/dL (ref 0.0–1.0)

## 2014-10-07 LAB — URINE CULTURE
Colony Count: NO GROWTH
Organism ID, Bacteria: NO GROWTH

## 2014-10-07 LAB — URINALYSIS, MICROSCOPIC ONLY
BACTERIA UA: NONE SEEN
Casts: NONE SEEN
Crystals: NONE SEEN

## 2014-10-08 ENCOUNTER — Other Ambulatory Visit (INDEPENDENT_AMBULATORY_CARE_PROVIDER_SITE_OTHER): Payer: BC Managed Care – PPO

## 2014-10-08 DIAGNOSIS — Z1212 Encounter for screening for malignant neoplasm of rectum: Secondary | ICD-10-CM

## 2014-10-08 DIAGNOSIS — Z0000 Encounter for general adult medical examination without abnormal findings: Secondary | ICD-10-CM

## 2014-10-08 LAB — POC HEMOCCULT BLD/STL (HOME/3-CARD/SCREEN)
Card #2 Fecal Occult Blod, POC: NEGATIVE
Card #3 Fecal Occult Blood, POC: NEGATIVE
Fecal Occult Blood, POC: NEGATIVE

## 2014-10-20 DIAGNOSIS — R319 Hematuria, unspecified: Secondary | ICD-10-CM | POA: Insufficient documentation

## 2014-10-20 DIAGNOSIS — N393 Stress incontinence (female) (male): Secondary | ICD-10-CM | POA: Insufficient documentation

## 2014-12-05 ENCOUNTER — Other Ambulatory Visit: Payer: Self-pay | Admitting: Emergency Medicine

## 2015-02-20 ENCOUNTER — Ambulatory Visit (INDEPENDENT_AMBULATORY_CARE_PROVIDER_SITE_OTHER): Payer: BC Managed Care – PPO | Admitting: Physician Assistant

## 2015-02-20 ENCOUNTER — Encounter: Payer: Self-pay | Admitting: Physician Assistant

## 2015-02-20 VITALS — BP 118/68 | HR 60 | Temp 98.6°F | Resp 18 | Ht 63.0 in | Wt 190.0 lb

## 2015-02-20 DIAGNOSIS — E785 Hyperlipidemia, unspecified: Secondary | ICD-10-CM

## 2015-02-20 DIAGNOSIS — G43809 Other migraine, not intractable, without status migrainosus: Secondary | ICD-10-CM

## 2015-02-20 DIAGNOSIS — M791 Myalgia, unspecified site: Secondary | ICD-10-CM

## 2015-02-20 DIAGNOSIS — Z79899 Other long term (current) drug therapy: Secondary | ICD-10-CM

## 2015-02-20 DIAGNOSIS — E669 Obesity, unspecified: Secondary | ICD-10-CM

## 2015-02-20 DIAGNOSIS — E559 Vitamin D deficiency, unspecified: Secondary | ICD-10-CM

## 2015-02-20 LAB — MAGNESIUM: Magnesium: 1.8 mg/dL (ref 1.5–2.5)

## 2015-02-20 LAB — BASIC METABOLIC PANEL WITH GFR
BUN: 6 mg/dL — ABNORMAL LOW (ref 7–25)
CO2: 25 mmol/L (ref 20–31)
Calcium: 8.5 mg/dL — ABNORMAL LOW (ref 8.6–10.2)
Chloride: 105 mmol/L (ref 98–110)
Creat: 0.53 mg/dL (ref 0.50–1.10)
GFR, Est Non African American: >89 mL/min (ref >=60)
GLUCOSE: 84 mg/dL (ref 65–99)
Potassium: 3.9 mmol/L (ref 3.5–5.3)
SODIUM: 138 mmol/L (ref 135–146)

## 2015-02-20 LAB — LIPID PANEL
CHOL/HDL RATIO: 7.9 ratio — AB (ref <=5.0)
Cholesterol: 275 mg/dL — ABNORMAL HIGH (ref 125–200)
HDL: 35 mg/dL — ABNORMAL LOW (ref >=46)
LDL Cholesterol: 211 mg/dL — ABNORMAL HIGH (ref <130)
TRIGLYCERIDES: 146 mg/dL (ref <150)
VLDL: 29 mg/dL (ref <30)

## 2015-02-20 LAB — HEPATIC FUNCTION PANEL
ALT: 11 U/L (ref 6–29)
AST: 12 U/L (ref 10–35)
Albumin: 3.6 g/dL (ref 3.6–5.1)
Alkaline Phosphatase: 65 U/L (ref 33–115)
Bilirubin, Direct: <0.1 mg/dL (ref <=0.2)
TOTAL PROTEIN: 5.8 g/dL — AB (ref 6.1–8.1)
Total Bilirubin: 0.3 mg/dL (ref 0.2–1.2)

## 2015-02-20 LAB — IRON AND TIBC
%SAT: 18 % — ABNORMAL LOW (ref 20–55)
Iron: 57 ug/dL (ref 42–145)
TIBC: 323 ug/dL (ref 250–470)
UIBC: 266 ug/dL (ref 125–400)

## 2015-02-20 LAB — CBC WITH DIFFERENTIAL/PLATELET
Basophils Absolute: 0.1 10*3/uL (ref 0.0–0.1)
Basophils Relative: 1 % (ref 0–1)
Eosinophils Absolute: 0.3 10*3/uL (ref 0.0–0.7)
Eosinophils Relative: 3 % (ref 0–5)
HCT: 39.7 % (ref 36.0–46.0)
Hemoglobin: 13.5 g/dL (ref 12.0–15.0)
LYMPHS PCT: 49 % — AB (ref 12–46)
Lymphs Abs: 4.2 10*3/uL — ABNORMAL HIGH (ref 0.7–4.0)
MCH: 28.8 pg (ref 26.0–34.0)
MCHC: 34 g/dL (ref 30.0–36.0)
MCV: 84.6 fL (ref 78.0–100.0)
MONO ABS: 0.8 10*3/uL (ref 0.1–1.0)
MONOS PCT: 9 % (ref 3–12)
MPV: 8.5 fL — ABNORMAL LOW (ref 8.6–12.4)
Neutro Abs: 3.2 10*3/uL (ref 1.7–7.7)
Neutrophils Relative %: 38 % — ABNORMAL LOW (ref 43–77)
Platelets: 251 10*3/uL (ref 150–400)
RBC: 4.69 MIL/uL (ref 3.87–5.11)
RDW: 13.5 % (ref 11.5–15.5)
WBC: 8.5 10*3/uL (ref 4.0–10.5)

## 2015-02-20 LAB — FERRITIN: Ferritin: 81 ng/mL (ref 10–291)

## 2015-02-20 LAB — HEMOGLOBIN A1C
HEMOGLOBIN A1C: 5.5 % (ref <5.7)
MEAN PLASMA GLUCOSE: 111 mg/dL (ref <117)

## 2015-02-20 LAB — TSH: TSH: 0.728 u[IU]/mL (ref 0.350–4.500)

## 2015-02-20 LAB — VITAMIN B12: Vitamin B-12: 252 pg/mL (ref 211–911)

## 2015-02-20 MED ORDER — ALPRAZOLAM 0.25 MG PO TABS: 0.2500 mg | ORAL_TABLET | Freq: Two times a day (BID) | ORAL | Status: DC | PRN

## 2015-02-20 MED ORDER — TOPIRAMATE 25 MG PO TABS: 25.0000 mg | ORAL_TABLET | Freq: Two times a day (BID) | ORAL | Status: DC

## 2015-02-20 MED ORDER — VITAMIN D (ERGOCALCIFEROL) 1.25 MG (50000 UNIT) PO CAPS: ORAL_CAPSULE | ORAL | Status: DC

## 2015-02-20 MED ORDER — ROSUVASTATIN CALCIUM 20 MG PO TABS: 10.0000 mg | ORAL_TABLET | Freq: Every day | ORAL | Status: DC

## 2015-02-20 NOTE — Patient Instructions (Addendum)
Start on the vitamin D 50,000 IU daily for 7 days then do 3 x a week.  Do crestor 1/2 pill every other day   Vitamin D goal is between 60-80  Please make sure that you are taking your Vitamin D as directed.   It is very important as a natural anti-inflammatory   helping hair, skin, and nails, as well as reducing stroke and heart attack risk.   It helps your bones and helps with mood.  It also decreases numerous cancer risks so please take it as directed.   Low Vit D is associated with a 200-300% higher risk for CANCER   and 200-300% higher risk for HEART   ATTACK  &  STROKE.    .....................................Marland Kitchen  It is also associated with higher death rate at younger ages,   autoimmune diseases like Rheumatoid arthritis, Lupus, Multiple Sclerosis.     Also many other serious conditions, like depression, Alzheimer's  Dementia, infertility, muscle aches, fatigue, fibromyalgia - just to name a few.  +++++++++++++++++++  What is the TMJ? The temporomandibular (tem-PUH-ro-man-DIB-yoo-ler) joint, or the TMJ, connects the upper and lower jawbones. This joint allows the jaw to open wide and move back and forth when you chew, talk, or yawn.There are also several muscles that help this joint move. There can be muscle tightness and pain in the muscle that can cause several symptoms.  What causes TMJ pain? There are many causes of TMJ pain. Repeated chewing (for example, chewing gum) and clenching your teeth can cause pain in the joint. Some TMJ pain has no obvious cause. What can I do to ease the pain? There are many things you can do to help your pain get better. When you have pain:  Eat soft foods and stay away from chewy foods (for example, taffy) Try to use both sides of your mouth to chew Don't chew gum Massage Don't open your mouth wide (for example, during yawning or singing) Don't bite your cheeks or fingernails Lower your amount of stress and worry Applying a warm,  damp washcloth to the joint may help. Over-the-counter pain medicines such as ibuprofen (one brand: Advil) or acetaminophen (one brand: Tylenol) might also help. Do not use these medicines if you are allergic to them or if your doctor told you not to use them. How can I stop the pain from coming back? When your pain is better, you can do these exercises to make your muscles stronger and to keep the pain from coming back:  Resisted mouth opening: Place your thumb or two fingers under your chin and open your mouth slowly, pushing up lightly on your chin with your thumb. Hold for three to six seconds. Close your mouth slowly. Resisted mouth closing: Place your thumbs under your chin and your two index fingers on the ridge between your mouth and the bottom of your chin. Push down lightly on your chin as you close your mouth. Tongue up: Slowly open and close your mouth while keeping the tongue touching the roof of the mouth. Side-to-side jaw movement: Place an object about one fourth of an inch thick (for example, two tongue depressors) between your front teeth. Slowly move your jaw from side to side. Increase the thickness of the object as the exercise becomes easier Forward jaw movement: Place an object about one fourth of an inch thick between your front teeth and move the bottom jaw forward so that the bottom teeth are in front of the top teeth. Increase the  thickness of the object as the exercise becomes easier. These exercises should not be painful. If it hurts to do these exercises, stop doing them and talk to your family doctor.     Bad carbs also include fruit juice, alcohol, and sweet tea. These are empty calories that do not signal to your brain that you are full.   Please remember the good carbs are still carbs which convert into sugar. So please measure them out no more than 1/2-1 cup of rice, oatmeal, pasta, and beans  Veggies are however free foods! Pile them on.   Not all fruit is  created equal. Please see the list below, the fruit at the bottom is higher in sugars than the fruit at the top. Please avoid all dried fruits.    Before you even begin to attack a weight-loss plan, it pays to remember this: You are not fat. You have fat. Losing weight isn't about blame or shame; it's simply another achievement to accomplish. Dieting is like any other skill-you have to buckle down and work at it. As long as you act in a smart, reasonable way, you'll ultimately get where you want to be. Here are some weight loss pearls for you.  1. It's Not a Diet. It's a Lifestyle Thinking of a diet as something you're on and suffering through only for the short term doesn't work. To shed weight and keep it off, you need to make permanent changes to the way you eat. It's OK to indulge occasionally, of course, but if you cut calories temporarily and then revert to your old way of eating, you'll gain back the weight quicker than you can say yo-yo. Use it to lose it. Research shows that one of the best predictors of long-term weight loss is how many pounds you drop in the first month. For that reason, nutritionists often suggest being stricter for the first two weeks of your new eating strategy to build momentum. Cut out added sugar and alcohol and avoid unrefined carbs. After that, figure out how you can reincorporate them in a way that's healthy and maintainable.  2. There's a Right Way to Exercise Working out burns calories and fat and boosts your metabolism by building muscle. But those trying to lose weight are notorious for overestimating the number of calories they burn and underestimating the amount they take in. Unfortunately, your system is biologically programmed to hold on to extra pounds and that means when you start exercising, your body senses the deficit and ramps up its hunger signals. If you're not diligent, you'll eat everything you burn and then some. Use it to lose it. Cardio gets all the  exercise glory, but strength and interval training are the real heroes. They help you build lean muscle, which in turn increases your metabolism and calorie-burning ability 3. Don't Overreact to Mild Hunger Some people have a hard time losing weight because of hunger anxiety. To them, being hungry is bad-something to be avoided at all costs-so they carry snacks with them and eat when they don't need to. Others eat because they're stressed out or bored. While you never want to get to the point of being ravenous (that's when bingeing is likely to happen), a hunger pang, a craving, or the fact that it's 3:00 p.m. should not send you racing for the vending machine or obsessing about the energy bar in your purse. Ideally, you should put off eating until your stomach is growling and it's difficult to concentrate.  Use it to lose it. When you feel the urge to eat, use the HALT method. Ask yourself, Am I really hungry? Or am I angry or anxious, lonely or bored, or tired? If you're still not certain, try the apple test. If you're truly hungry, an apple should seem delicious; if it doesn't, something else is going on. Or you can try drinking water and making yourself busy, if you are still hungry try a healthy snack.  4. Not All Calories Are Created Equal The mechanics of weight loss are pretty simple: Take in fewer calories than you use for energy. But the kind of food you eat makes all the difference. Processed food that's high in saturated fat and refined starch or sugar can cause inflammation that disrupts the hormone signals that tell your brain you're full. The result: You eat a lot more.  Use it to lose it. Clean up your diet. Swap in whole, unprocessed foods, including vegetables, lean protein, and healthy fats that will fill you up and give you the biggest nutritional bang for your calorie buck. In a few weeks, as your brain starts receiving regular hunger and fullness signals once again, you'll notice that you  feel less hungry overall and naturally start cutting back on the amount you eat.  5. Protein, Produce, and Plant-Based Fats Are Your Weight-Loss Trinity Here's why eating the three Ps regularly will help you drop pounds. Protein fills you up. You need it to build lean muscle, which keeps your metabolism humming so that you can torch more fat. People in a weight-loss program who ate double the recommended daily allowance for protein (about 110 grams for a 150-pound woman) lost 70 percent of their weight from fat, while people who ate the RDA lost only about 40 percent, one study found. Produce is packed with filling fiber. "It's very difficult to consume too many calories if you're eating a lot of vegetables. Example: Three cups of broccoli is a lot of food, yet only 93 calories. (Fruit is another story. It can be easy to overeat and can contain a lot of calories from sugar, so be sure to monitor your intake.) Plant-based fats like olive oil and those in avocados and nuts are healthy and extra satiating.  Use it to lose it. Aim to incorporate each of the three Ps into every meal and snack. People who eat protein throughout the day are able to keep weight off, according to a study in the [employer] of Clinical [employer]. In addition to meat, poultry and seafood, good sources are beans, lentils, eggs, tofu, and yogurt. As for fat, keep portion sizes in check by measuring out salad dressing, oil, and nut butters (shoot for one to two tablespoons). Finally, eat veggies or a little fruit at every meal. People who did that consumed 308 fewer calories but didn't feel any hungrier than when they didn't eat more produce.  7. How You Eat Is As Important As What You Eat In order for your brain to register that you're full, you need to focus on what you're eating. Sit down whenever you eat, preferably at a table. Turn off the TV or computer, put down your phone, and look at your food. Smell it. Chew slowly, and  don't put another bite on your fork until you swallow. When women ate lunch this attentively, they consumed 30 percent less when snacking later than those who listened to an audiobook at lunchtime, according to a study in the [employer] of  Nutrition. 8. Weighing Yourself Really Works The scale provides the best evidence about whether your efforts are paying off. Seeing the numbers tick up or down or stagnate is motivation to keep going-or to rethink your approach. A 2015 study at [facility] found that daily weigh-ins helped people lose more weight, keep it off, and maintain that loss, even after two years. Use it to lose it. Step on the scale at the same time every day for the best results. If your weight shoots up several pounds from one weigh-in to the next, don't freak out. Eating a lot of salt the night before or having your period is the likely culprit. The number should return to normal in a day or two. It's a steady climb that you need to do something about. 9. Too Much Stress and Too Little Sleep Are Your Enemies When you're tired and frazzled, your body cranks up the production of cortisol, the stress hormone that can cause carb cravings. Not getting enough sleep also boosts your levels of ghrelin, a hormone associated with hunger, while suppressing leptin, a hormone that signals fullness and satiety. People on a diet who slept only five and a half hours a night for two weeks lost 55 percent less fat and were hungrier than those who slept eight and a half hours, according to a study in the [place]. Use it to lose it. Prioritize sleep, aiming for seven hours or more a night, which research shows helps lower stress. And make sure you're getting quality zzz's. If a snoring spouse or a fidgety cat wakes you up frequently throughout the night, you may end up getting the equivalent of just four hours of sleep, according to a study from [facility]. Keep pets  out of the bedroom, and use a white-noise app to drown out snoring. 10. You Will Hit a plateau-And You Can Bust Through It As you slim down, your body releases much less leptin, the fullness hormone.  If you're not strength training, start right now. Building muscle can raise your metabolism to help you overcome a plateau. To keep your body challenged and burning calories, incorporate new moves and more intense intervals into your workouts or add another sweat session to your weekly routine. Alternatively, cut an extra 100 calories or so a day from your diet. Now that you've lost weight, your body simply doesn't need as much fuel.  Ways to cut 100 calories  1. Eat your eggs with hot sauce OR salsa instead of cheese.  Eggs are great for breakfast, but many people consider eggs and cheese to be BFFs. Instead of cheese-1 oz. of cheddar has 114 calories-top your eggs with hot sauce, which contains no calories and helps with satiety and metabolism. Salsa is also a great option!!  2. Top your toast, waffles or pancakes with mashed berries instead of jelly or syrup. Half a cup of berries-fresh, frozen or thawed-has about 40 calories, compared with 2 tbsp. of maple syrup or jelly, which both have about 100 calories. The berries will also give you a good punch of fiber, which helps keep you full and satisfied and won't spike blood sugar quickly like the jelly or syrup. 3. Swap the non-fat latte for black coffee with a splash of half-and-half. Contrary to its name, that non-fat latte has 130 calories and a startling 19g of carbohydrates per 16 oz. serving. Replacing that 'light' drinkable dessert with a black coffee with a splash of half-and-half saves you  more than 100 calories per 16 oz. serving. 4. Sprinkle salads with freeze-dried raspberries instead of dried cranberries. If you want a sweet addition to your nutritious salad, stay away from dried cranberries. They have a whopping 130 calories per  cup and 30g  carbohydrates. Instead, sprinkle freeze-dried raspberries guilt-free and save more than 100 calories per  cup serving, adding 3g of belly-filling fiber. 5. Go for mustard in place of mayo on your sandwich. Mustard can add really nice flavor to any sandwich, and there are tons of varieties, from spicy to honey. A serving of mayo is 95 calories, versus 10 calories in a serving of mustard. 6. Choose a DIY salad dressing instead of the store-bought kind. Mix Dijon or whole grain mustard with low-fat Kefir or red wine vinegar and garlic. 7. Use hummus as a spread instead of a dip. Use hummus as a spread on a high-fiber cracker or tortilla with a sandwich and save on calories without sacrificing taste. 8. Pick just one salad "accessory." Salad isn't automatically a calorie winner. It's easy to over-accessorize with toppings. Instead of topping your salad with nuts, avocado and cranberries (all three will clock in at 313 calories), just pick one. The next day, choose a different accessory, which will also keep your salad interesting. You don't wear all your jewelry every day, right? 9. Ditch the white pasta in favor of spaghetti squash. One cup of cooked spaghetti squash has about 40 calories, compared with traditional spaghetti, which comes with more than 200. Spaghetti squash is also nutrient-dense. It's a good source of fiber and Vitamins A and C, and it can be eaten just like you would eat pasta-with a great tomato sauce and [place] meatballs or with pesto, tofu and spinach, for example. 10. Dress up your chili, soups and stews with non-fat [place] yogurt instead of sour cream. Just a 'dollop' of sour cream can set you back 115 calories and a whopping 12g of fat-seven of which are of the artery-clogging variety. Added bonus: [place] yogurt is packed with muscle-building protein, calcium and B Vitamins. 11. Mash cauliflower instead of mashed potatoes. One cup of traditional mashed potatoes-in all their creamy  goodness-has more than 200 calories, compared to mashed cauliflower, which you can typically eat for less than 100 calories per 1 cup serving. Cauliflower is a great source of the antioxidant indole-3-carbinol (I3C), which may help reduce the risk of some cancers, like breast cancer. 12. Ditch the ice cream sundae in favor of a [place] yogurt parfait. Instead of a cup of ice cream or fro-yo for dessert, try 1 cup of nonfat Greek yogurt topped with fresh berries and a sprinkle of cacao nibs. Both toppings are packed with antioxidants, which can help reduce cellular inflammation and oxidative damage. And the comparison is a no-brainer: One cup of ice cream has about 275 calories; one cup of frozen yogurt has about 230; and a cup of Greek yogurt has just 130, plus twice the protein, so you're less likely to return to the freezer for a second helping. 13. Put olive oil in a spray container instead of using it directly from the bottle. Each tablespoon of olive oil is 120 calories and 15g of fat. Use a mister instead of pouring it straight into the pan or onto a salad. This allows for portion control and will save you more than 100 calories. 14. When baking, substitute canned pumpkin for butter or oil. Canned pumpkin-not pumpkin pie mix-is loaded with Vitamin A, which is  important for skin and eye health, as well as immunity. And the comparisons are pretty crazy:  cup of canned pumpkin has about 40 calories, compared to butter or oil, which has more than 800 calories. Yes, 800 calories. Applesauce and mashed banana can also serve as good substitutions for butter or oil, usually in a 1:1 ratio. 15. Top casseroles with high-fiber cereal instead of breadcrumbs. Breadcrumbs are typically made with white bread, while breakfast cereals contain 5-9g of fiber per serving. Not only will you save more than 150 calories per  cup serving, the swap will also keep you more full and you'll get a metabolism boost from the added  fiber. 16. Snack on pistachios instead of macadamia nuts. Believe it or not, you get the same amount of calories from 35 pistachios (100 calories) as you would from only five macadamia nuts. 17. Chow down on kale chips rather than potato chips. This is my favorite 'don't knock it 'till you try it' swap. Kale chips are so easy to make at home, and you can spice them up with a little grated parmesan or chili powder. Plus, they're a mere fraction of the calories of potato chips, but with the same crunch factor we crave so often. 18. Add seltzer and some fruit slices to your cocktail instead of soda or fruit juice. One cup of soda or fruit juice can pack on as much as 140 calories. Instead, use seltzer and fruit slices. The fruit provides valuable phytochemicals, such as flavonoids and anthocyanins, which help to combat cancer and stave off the aging process.

## 2015-02-20 NOTE — Progress Notes (Signed)
Assessment and Plan:  1. Hypertension -Continue medication, monitor blood pressure at home. Continue DASH diet.  Reminder to go to the ER if any CP, SOB, nausea, dizziness, severe HA, changes vision/speech, left arm numbness and tingling and jaw pain.  2. Cholesterol -Continue diet and exercise. Check cholesterol.   3. RLS versus myalgias Check iron, ferritin, magnesium, will recheck chol, lfts, and cpk on crestor 4 weeks.  4. Vitamin D Def - check level and continue medications.   5. Migraines Going to headache clinic  Continue diet and meds as discussed. Further disposition pending results of labs. Over 30 minutes of exam, counseling, chart review, and critical decision making was performed  HPI 49 y.o. female  presents for 4 month follow up on hypertension, cholesterol, migraines, and vitamin D deficiency.   Her blood pressure has been controlled at home, today their BP is BP: 118/68 mmHg  She does not workout. She denies chest pain, shortness of breath, dizziness.  She is not on cholesterol medication but was suppose to restart last visit, started back on crestor 10mg  at night QD but has been out x 1 month, has been having achy legs and tingling at night in her feet. Her cholesterol is not at goal. The cholesterol last visit was:   Lab Results  Component Value Date   CHOL 312* 09/23/2014   HDL 39* 09/23/2014   LDLCALC 237* 09/23/2014   TRIG 180* 09/23/2014   CHOLHDL 8.0 09/23/2014    Last A1C in the office was:  Lab Results  Component Value Date   HGBA1C 5.6 09/23/2014   She is off topamax for migraine prevention, had had low grade HA all of July, going to headache clinic on the 16th and has been off all her meds due ot this.   Patient is not on Vitamin D supplement.   Lab Results  Component Value Date   VD25OH 22* 09/23/2014     In addition she has smoking history with hematuria, sent to urology for referral, with normal evaluation.  BMI is Body mass index is 33.67  kg/(m^2)., she is working on diet and exercise. Wt Readings from Last 3 Encounters:  02/20/15 190 lb (86.183 kg)  09/23/14 182 lb (82.555 kg)  08/27/13 170 lb (77.111 kg)      Current Medications:  Current Outpatient Prescriptions on File Prior to Visit  Medication Sig Dispense Refill  . albuterol (PROVENTIL HFA;VENTOLIN HFA) 108 (90 BASE) MCG/ACT inhaler Inhale 2 puffs into the lungs every 6 (six) hours as needed. 18 g 1  . ALPRAZolam (XANAX) 0.25 MG tablet Take 1 tablet (0.25 mg total) by mouth 2 (two) times daily as needed for anxiety. 60 tablet 1  . Biotin 10 MG CAPS Take by mouth daily.    . CRESTOR 10 MG tablet TAKE 1 TABLET BY MOUTH EVERY DAY. 30 tablet 3  . ibuprofen (ADVIL,MOTRIN) 800 MG tablet Take 1 tablet (800 mg total) by mouth 3 (three) times daily. 21 tablet 0  . topiramate (TOPAMAX) 25 MG tablet Take 1 tablet (25 mg total) by mouth 2 (two) times daily. 180 tablet 1   No current facility-administered medications on file prior to visit.   Medical History:  Past Medical History  Diagnosis Date  . Hyperlipidemia   . Anxiety   . Asthma   . Migraine   . Vitamin D deficiency   . Benign hematuria 2011    [place]   Allergies:  Allergies  Allergen Reactions  . Shellfish Allergy Anaphylaxis  Review of Systems:  Review of Systems  Constitutional: Negative.   HENT: Negative for congestion, ear discharge, ear pain, hearing loss, nosebleeds, sore throat and tinnitus.   Eyes: Negative.   Respiratory: Negative.  Negative for shortness of breath and stridor.   Cardiovascular: Negative.  Negative for chest pain.  Gastrointestinal: Positive for constipation. Negative for heartburn, nausea, vomiting, abdominal pain, diarrhea, blood in stool and melena.  Genitourinary: Negative.   Musculoskeletal: Positive for myalgias. Negative for back pain, joint pain, falls and neck pain.  Skin: Negative.   Neurological: Negative.   Psychiatric/Behavioral: Negative.  Negative for  suicidal ideas. The patient is not nervous/anxious.   All other systems reviewed and are negative.   Family history- Review and unchanged Social history- Review and unchanged Physical Exam: BP 118/68 mmHg  Pulse 60  Temp(Src) 98.6 F (37 C) (Temporal)  Resp 18  Ht 5\' 3"  (1.6 m)  Wt 190 lb (86.183 kg)  BMI 33.67 kg/m2 Wt Readings from Last 3 Encounters:  02/20/15 190 lb (86.183 kg)  09/23/14 182 lb (82.555 kg)  08/27/13 170 lb (77.111 kg)   General Appearance: Well nourished, in no apparent distress. Eyes: PERRLA, EOMs, conjunctiva no swelling or erythema Sinuses: No Frontal/maxillary tenderness ENT/Mouth: Ext aud canals clear, TMs without erythema, bulging. No erythema, swelling, or exudate on post pharynx.  Tonsils not swollen or erythematous. Hearing normal.  Neck: Supple, thyroid normal.  Respiratory: Respiratory effort normal, BS equal bilaterally without rales, rhonchi, wheezing or stridor.  Cardio: RRR with no MRGs. Brisk peripheral pulses without edema.  Abdomen: Soft, + BS,  Non tender, no guarding, rebound, hernias, masses. Lymphatics: Non tender without lymphadenopathy.  Musculoskeletal: Full ROM, 5/5 strength, Normal gait Skin: Warm, dry without rashes, lesions, ecchymosis.  Neuro: Cranial nerves intact. Normal muscle tone, no cerebellar symptoms. Psych: Awake and oriented X 3, normal affect, Insight and Judgment appropriate.    Vicie Mutters, PA-C 8:46 AM [facility]

## 2015-02-21 LAB — VITAMIN D 25 HYDROXY (VIT D DEFICIENCY, FRACTURES): Vit D, 25-Hydroxy: 23 ng/mL — ABNORMAL LOW (ref 30–100)

## 2015-03-23 ENCOUNTER — Encounter: Payer: BC Managed Care – PPO | Admitting: Physician Assistant

## 2015-03-23 DIAGNOSIS — E785 Hyperlipidemia, unspecified: Secondary | ICD-10-CM | POA: Diagnosis not present

## 2015-03-23 DIAGNOSIS — G2581 Restless legs syndrome: Secondary | ICD-10-CM

## 2015-03-23 LAB — HEPATIC FUNCTION PANEL
ALT: 12 U/L (ref 6–29)
AST: 10 U/L (ref 10–35)
Albumin: 3.9 g/dL (ref 3.6–5.1)
Alkaline Phosphatase: 62 U/L (ref 33–115)
Bilirubin, Direct: 0.1 mg/dL (ref <=0.2)
Indirect Bilirubin: 0.2 mg/dL (ref 0.2–1.2)
TOTAL PROTEIN: 6.1 g/dL (ref 6.1–8.1)
Total Bilirubin: 0.3 mg/dL (ref 0.2–1.2)

## 2015-03-23 LAB — LIPID PANEL
CHOLESTEROL: 225 mg/dL — AB (ref 125–200)
HDL: 33 mg/dL — ABNORMAL LOW (ref >=46)
LDL Cholesterol: 154 mg/dL — ABNORMAL HIGH (ref <130)
TRIGLYCERIDES: 190 mg/dL — AB (ref <150)
Total CHOL/HDL Ratio: 6.8 Ratio — ABNORMAL HIGH (ref <=5.0)
VLDL: 38 mg/dL — ABNORMAL HIGH (ref <30)

## 2015-03-23 LAB — CK: Total CK: 51 U/L (ref 7–177)

## 2015-03-24 NOTE — Progress Notes (Signed)
This encounter was created in error - please disregard.

## 2015-06-23 ENCOUNTER — Encounter: Payer: Self-pay | Admitting: Physician Assistant

## 2015-06-23 ENCOUNTER — Ambulatory Visit (INDEPENDENT_AMBULATORY_CARE_PROVIDER_SITE_OTHER): Payer: BC Managed Care – PPO | Admitting: Physician Assistant

## 2015-06-23 VITALS — BP 128/72 | HR 67 | Temp 97.7°F | Resp 16 | Ht 63.0 in | Wt 194.0 lb

## 2015-06-23 DIAGNOSIS — M79671 Pain in right foot: Secondary | ICD-10-CM

## 2015-06-23 DIAGNOSIS — Z79899 Other long term (current) drug therapy: Secondary | ICD-10-CM | POA: Diagnosis not present

## 2015-06-23 DIAGNOSIS — E559 Vitamin D deficiency, unspecified: Secondary | ICD-10-CM | POA: Diagnosis not present

## 2015-06-23 DIAGNOSIS — E785 Hyperlipidemia, unspecified: Secondary | ICD-10-CM | POA: Diagnosis not present

## 2015-06-23 DIAGNOSIS — E669 Obesity, unspecified: Secondary | ICD-10-CM | POA: Diagnosis not present

## 2015-06-23 DIAGNOSIS — G43809 Other migraine, not intractable, without status migrainosus: Secondary | ICD-10-CM | POA: Diagnosis not present

## 2015-06-23 DIAGNOSIS — M79672 Pain in left foot: Secondary | ICD-10-CM | POA: Diagnosis not present

## 2015-06-23 LAB — BASIC METABOLIC PANEL WITH GFR
BUN: 9 mg/dL (ref 7–25)
CHLORIDE: 104 mmol/L (ref 98–110)
CO2: 25 mmol/L (ref 20–31)
Calcium: 9 mg/dL (ref 8.6–10.2)
Creat: 0.66 mg/dL (ref 0.50–1.10)
GFR, Est African American: >89 mL/min (ref >=60)
GFR, Est Non African American: >89 mL/min (ref >=60)
Glucose, Bld: 83 mg/dL (ref 65–99)
POTASSIUM: 4.3 mmol/L (ref 3.5–5.3)
Sodium: 137 mmol/L (ref 135–146)

## 2015-06-23 LAB — CBC WITH DIFFERENTIAL/PLATELET
Basophils Absolute: 0.1 10*3/uL (ref 0.0–0.1)
Basophils Relative: 1 % (ref 0–1)
Eosinophils Absolute: 0.3 10*3/uL (ref 0.0–0.7)
Eosinophils Relative: 4 % (ref 0–5)
HEMATOCRIT: 41.3 % (ref 36.0–46.0)
HEMOGLOBIN: 14.3 g/dL (ref 12.0–15.0)
LYMPHS ABS: 2.8 10*3/uL (ref 0.7–4.0)
LYMPHS PCT: 42 % (ref 12–46)
MCH: 29.2 pg (ref 26.0–34.0)
MCHC: 34.6 g/dL (ref 30.0–36.0)
MCV: 84.5 fL (ref 78.0–100.0)
MONO ABS: 0.7 10*3/uL (ref 0.1–1.0)
MPV: 9 fL (ref 8.6–12.4)
Monocytes Relative: 10 % (ref 3–12)
NEUTROS ABS: 2.8 10*3/uL (ref 1.7–7.7)
Neutrophils Relative %: 43 % (ref 43–77)
Platelets: 286 10*3/uL (ref 150–400)
RBC: 4.89 MIL/uL (ref 3.87–5.11)
RDW: 13.1 % (ref 11.5–15.5)
WBC: 6.6 10*3/uL (ref 4.0–10.5)

## 2015-06-23 LAB — HEPATIC FUNCTION PANEL
ALBUMIN: 3.8 g/dL (ref 3.6–5.1)
ALK PHOS: 76 U/L (ref 33–115)
ALT: 27 U/L (ref 6–29)
AST: 15 U/L (ref 10–35)
BILIRUBIN DIRECT: 0.1 mg/dL (ref <=0.2)
BILIRUBIN TOTAL: 0.4 mg/dL (ref 0.2–1.2)
Indirect Bilirubin: 0.3 mg/dL (ref 0.2–1.2)
Total Protein: 6.2 g/dL (ref 6.1–8.1)

## 2015-06-23 LAB — LIPID PANEL
CHOL/HDL RATIO: 7.1 ratio — AB (ref <=5.0)
Cholesterol: 292 mg/dL — ABNORMAL HIGH (ref 125–200)
HDL: 41 mg/dL — AB (ref >=46)
LDL CALC: 226 mg/dL — AB (ref <130)
Triglycerides: 126 mg/dL (ref <150)
VLDL: 25 mg/dL (ref <30)

## 2015-06-23 LAB — MAGNESIUM: MAGNESIUM: 1.9 mg/dL (ref 1.5–2.5)

## 2015-06-23 LAB — TSH: TSH: 0.782 u[IU]/mL (ref 0.350–4.500)

## 2015-06-23 MED ORDER — ALPRAZOLAM 0.25 MG PO TABS
0.2500 mg | ORAL_TABLET | Freq: Two times a day (BID) | ORAL | Status: DC | PRN
Start: 2015-06-23 — End: 2016-01-06

## 2015-06-23 MED ORDER — SUMATRIPTAN SUCCINATE 25 MG PO TABS: ORAL_TABLET | ORAL | Status: DC

## 2015-06-23 MED ORDER — PHENTERMINE HCL 37.5 MG PO TABS: 37.5000 mg | ORAL_TABLET | Freq: Every day | ORAL | Status: DC

## 2015-06-23 MED ORDER — ROSUVASTATIN CALCIUM 20 MG PO TABS: 20.0000 mg | ORAL_TABLET | Freq: Every day | ORAL | Status: DC

## 2015-06-23 NOTE — Progress Notes (Signed)
Assessment and Plan:  1. Hypertension -Continue medication, monitor blood pressure at home. Continue DASH diet.  Reminder to go to the ER if any CP, SOB, nausea, dizziness, severe HA, changes vision/speech, left arm numbness and tingling and jaw pain.  2. Cholesterol -Continue diet and exercise. Check cholesterol.   3. Feet pain Likely due to flat feet/falling arch Get arch support, stretches given, better shoes, RICE and aleve If not better will send to ortho  4. Vitamin D Def - check level and continue medications.   5. Migraines Going to headache clinic  6. Obesity with co morbidities - long discussion about weight loss, diet, and exercise, will start the patient on phentermine- hand out given and AE's discussed, will do close follow up.    Continue diet and meds as discussed. Further disposition pending results of labs. Over 30 minutes of exam, counseling, chart review, and critical decision making was performed  HPI 49 y.o. female  presents for 4 month follow up on hypertension, cholesterol, migraines, and vitamin D deficiency.   Her blood pressure has been controlled at home, today their BP is BP: 128/72 mmHg  She does not workout. She denies chest pain, shortness of breath, dizziness.  She is on cholesterol medication crestor 10mg  with negative CPK last visit, but has not had since 1 month, needs generic.   Her cholesterol is not at goal. The cholesterol last visit was:   Lab Results  Component Value Date   CHOL 225* 03/23/2015   HDL 33* 03/23/2015   LDLCALC 154* 03/23/2015   TRIG 190* 03/23/2015   CHOLHDL 6.8* 03/23/2015    Last A1C in the office was:  Lab Results  Component Value Date   HGBA1C 5.5 02/20/2015   She is off topamax for migraine prevention going to headache clinic on the 16th and has been off all her meds due ot this. Has not had any headaches, has been doing well.   Patient is on Vitamin D supplement, 5000 IU 2 a day.    Lab Results  Component  Value Date   VD25OH 23* 02/20/2015     In addition she has smoking history with hematuria, sent to urology for referral, with normal evaluation.  BMI is Body mass index is 34.37 kg/(m^2)., she is working on diet and exercise. Wt Readings from Last 3 Encounters:  06/23/15 194 lb (87.998 kg)  02/20/15 190 lb (86.183 kg)  09/23/14 182 lb (82.555 kg)     Current Medications:  Current Outpatient Prescriptions on File Prior to Visit  Medication Sig Dispense Refill  . ALPRAZolam (XANAX) 0.25 MG tablet Take 1 tablet (0.25 mg total) by mouth 2 (two) times daily as needed for anxiety. 60 tablet 1  . Biotin 10 MG CAPS Take by mouth daily.    Marland Kitchen ibuprofen (ADVIL,MOTRIN) 800 MG tablet Take 1 tablet (800 mg total) by mouth 3 (three) times daily. 21 tablet 0  . topiramate (TOPAMAX) 25 MG tablet Take 1 tablet (25 mg total) by mouth 2 (two) times daily. 180 tablet 1   No current facility-administered medications on file prior to visit.   Medical History:  Past Medical History  Diagnosis Date  . Hyperlipidemia   . Anxiety   . Asthma   . Migraine   . Vitamin D deficiency   . Benign hematuria 2011    [place]   Allergies:  Allergies  Allergen Reactions  . Shellfish Allergy Anaphylaxis     Review of Systems:  Review of Systems  Constitutional: Negative.   HENT: Negative for congestion, ear discharge, ear pain, hearing loss, nosebleeds, sore throat and tinnitus.   Eyes: Negative.   Respiratory: Negative.  Negative for shortness of breath and stridor.   Cardiovascular: Negative.  Negative for chest pain.  Gastrointestinal: Positive for constipation. Negative for heartburn, nausea, vomiting, abdominal pain, diarrhea, blood in stool and melena.  Genitourinary: Negative.   Musculoskeletal: Positive for myalgias and back pain. Negative for joint pain, falls and neck pain.  Skin: Negative.   Neurological: Negative.   Psychiatric/Behavioral: Negative.  Negative for suicidal ideas. The patient is  not nervous/anxious.   All other systems reviewed and are negative.   Family history- Review and unchanged Social history- Review and unchanged Physical Exam: BP 128/72 mmHg  Pulse 67  Temp(Src) 97.7 F (36.5 C) (Temporal)  Resp 16  Ht 5\' 3"  (1.6 m)  Wt 194 lb (87.998 kg)  BMI 34.37 kg/m2  SpO2 98% Wt Readings from Last 3 Encounters:  06/23/15 194 lb (87.998 kg)  02/20/15 190 lb (86.183 kg)  09/23/14 182 lb (82.555 kg)   General Appearance: Well nourished, in no apparent distress. Eyes: PERRLA, EOMs, conjunctiva no swelling or erythema Sinuses: No Frontal/maxillary tenderness ENT/Mouth: Ext aud canals clear, TMs without erythema, bulging. No erythema, swelling, or exudate on post pharynx.  Tonsils not swollen or erythematous. Hearing normal.  Neck: Supple, thyroid normal.  Respiratory: Respiratory effort normal, BS equal bilaterally without rales, rhonchi, wheezing or stridor.  Cardio: RRR with no MRGs. Brisk peripheral pulses without edema.  Abdomen: Soft, + BS,  Non tender, no guarding, rebound, hernias, masses. Lymphatics: Non tender without lymphadenopathy.  Musculoskeletal: Full ROM, 5/5 strength, Normal gait, flat feet, bilateral bunions, normal foot exam otherwise.  Skin: Warm, dry without rashes, lesions, ecchymosis.  Neuro: Cranial nerves intact. Normal muscle tone, no cerebellar symptoms. Psych: Awake and oriented X 3, normal affect, Insight and Judgment appropriate.    Vicie Mutters, PA-C 8:41 AM [facility]

## 2015-06-23 NOTE — Patient Instructions (Addendum)
Drink 100 oz of water a day Eat a consistent healthy breakfast, protein shake, yogurt, eggs Avoid nuts or calculate out.   Phentermine  While taking the medication we may ask that you come into the office once a month or once every 2-3 months to monitor your weight, blood pressure, and heart rate. In addition we can help answer your questions about diet, exercise, and help you every step of the way with your weight loss journey. Sometime it is helpful if you bring in a food diary or use an app on your phone such as myfitnesspal to record your calorie intake, especially in the beginning.   You can start out on 1/3 to 1/2 a pill in the morning and if you are tolerating it well you can increase to one pill daily. I also have some patients that take 1/3 or 1/2 at lunch to help prevent night time eating.  This medication is cheapest CASH pay at [facility] is 14-17 dollars and you do NOT need a membership to get meds from there.    What is this medicine? PHENTERMINE (FEN ter meen) decreases your appetite. This medicine is intended to be used in addition to a healthy reduced calorie diet and exercise. The best results are achieved this way. This medicine is only indicated for short-term use. Eventually your weight loss may level out and the medication will no longer be needed.   How should I use this medicine? Take this medicine by mouth. Follow the directions on the prescription label. The tablets should stay in the bottle until immediately before you take your dose. Take your doses at regular intervals. Do not take your medicine more often than directed.  Overdosage: If you think you have taken too much of this medicine contact a poison control center or emergency room at once. NOTE: This medicine is only for you. Do not share this medicine with others.  What if I miss a dose? If you miss a dose, take it as soon as you can. If it is almost time for your next dose, take only that  dose. Do not take double or extra doses. Do not increase or in any way change your dose without consulting your doctor.  What should I watch for while using this medicine? Notify your physician immediately if you become short of breath while doing your normal activities. Do not take this medicine within 6 hours of bedtime. It can keep you from getting to sleep. Avoid drinks that contain caffeine and try to stick to a regular bedtime every night. Do not stand or sit up quickly, especially if you are an older patient. This reduces the risk of dizzy or fainting spells. Avoid alcoholic drinks.  What side effects may I notice from receiving this medicine? Side effects that you should report to your doctor or health care professional as soon as possible: -chest pain, palpitations -depression or severe changes in mood -increased blood pressure -irritability -nervousness or restlessness -severe dizziness -shortness of breath -problems urinating -unusual swelling of the legs -vomiting  Side effects that usually do not require medical attention (report to your doctor or health care professional if they continue or are bothersome): -blurred vision or other eye problems -changes in sexual ability or desire -constipation or diarrhea -difficulty sleeping -dry mouth or unpleasant taste -headache -nausea This list may not describe all possible side effects. Call your doctor for medical advice about side effects. You may report side effects to FDA  at 1-800-FDA-1088.    We want weight loss that will last so you should lose 1-2 pounds a week.  THAT IS IT! Please pick THREE things a month to change. Once it is a habit check off the item. Then pick another three items off the list to become habits.  If you are already doing a habit on the list GREAT!  Cross that item off! o Don't drink your calories. Ie, alcohol, soda, fruit juice, and sweet tea.  o Drink more water. Drink a glass when you feel hungry or  before each meal.  o Eat breakfast - Complex carb and protein (likeDannon light and fit yogurt, oatmeal, fruit, eggs, [place] bacon). o Measure your cereal.  Eat no more than one cup a day. (ie [place]) o Eat an apple a day. o Add a vegetable a day. o Try a new vegetable a month. o Use Pam! Stop using oil or butter to cook. o Don't finish your plate or use smaller plates. o Share your dessert. o Eat sugar free Jello for dessert or frozen grapes. o Don't eat 2-3 hours before bed. o Switch to whole wheat bread, pasta, and brown rice. o Make healthier choices when you eat out. No fries! o Pick baked chicken, NOT fried. o Don't forget to SLOW DOWN when you eat. It is not going anywhere.  o Take the stairs. o Park far away in the parking lot o [employer] (or weights) for 10 minutes while watching TV. o Walk at work for 10 minutes during break. o Walk outside 1 time a week with your friend, kids, dog, or significant other. o Start a walking group at [place] the mall as much as you can tolerate.  o Keep a food diary. o Weigh yourself daily. o Walk for 15 minutes 3 days per week. o Cook at home more often and eat out less.  If life happens and you go back to old habits, it is okay.  Just start over. You can do it!   If you experience chest pain, get short of breath, or tired during the exercise, please stop immediately and inform your doctor.   Before you even begin to attack a weight-loss plan, it pays to remember this: You are not fat. You have fat. Losing weight isn't about blame or shame; it's simply another achievement to accomplish. Dieting is like any other skill-you have to buckle down and work at it. As long as you act in a smart, reasonable way, you'll ultimately get where you want to be. Here are some weight loss pearls for you.  1. It's Not a Diet. It's a Lifestyle Thinking of a diet as something you're on and suffering through only for the short term doesn't work. To shed  weight and keep it off, you need to make permanent changes to the way you eat. It's OK to indulge occasionally, of course, but if you cut calories temporarily and then revert to your old way of eating, you'll gain back the weight quicker than you can say yo-yo. Use it to lose it. Research shows that one of the best predictors of long-term weight loss is how many pounds you drop in the first month. For that reason, nutritionists often suggest being stricter for the first two weeks of your new eating strategy to build momentum. Cut out added sugar and alcohol and avoid unrefined carbs. After that, figure out how you can reincorporate them in a way that's healthy and  maintainable.  2. There's a Right Way to Exercise Working out burns calories and fat and boosts your metabolism by building muscle. But those trying to lose weight are notorious for overestimating the number of calories they burn and underestimating the amount they take in. Unfortunately, your system is biologically programmed to hold on to extra pounds and that means when you start exercising, your body senses the deficit and ramps up its hunger signals. If you're not diligent, you'll eat everything you burn and then some. Use it to lose it. Cardio gets all the exercise glory, but strength and interval training are the real heroes. They help you build lean muscle, which in turn increases your metabolism and calorie-burning ability 3. Don't Overreact to Mild Hunger Some people have a hard time losing weight because of hunger anxiety. To them, being hungry is bad-something to be avoided at all costs-so they carry snacks with them and eat when they don't need to. Others eat because they're stressed out or bored. While you never want to get to the point of being ravenous (that's when bingeing is likely to happen), a hunger pang, a craving, or the fact that it's 3:00 p.m. should not send you racing for the vending machine or obsessing about the energy  bar in your purse. Ideally, you should put off eating until your stomach is growling and it's difficult to concentrate.  Use it to lose it. When you feel the urge to eat, use the HALT method. Ask yourself, Am I really hungry? Or am I angry or anxious, lonely or bored, or tired? If you're still not certain, try the apple test. If you're truly hungry, an apple should seem delicious; if it doesn't, something else is going on. Or you can try drinking water and making yourself busy, if you are still hungry try a healthy snack.  4. Not All Calories Are Created Equal The mechanics of weight loss are pretty simple: Take in fewer calories than you use for energy. But the kind of food you eat makes all the difference. Processed food that's high in saturated fat and refined starch or sugar can cause inflammation that disrupts the hormone signals that tell your brain you're full. The result: You eat a lot more.  Use it to lose it. Clean up your diet. Swap in whole, unprocessed foods, including vegetables, lean protein, and healthy fats that will fill you up and give you the biggest nutritional bang for your calorie buck. In a few weeks, as your brain starts receiving regular hunger and fullness signals once again, you'll notice that you feel less hungry overall and naturally start cutting back on the amount you eat.  5. Protein, Produce, and Plant-Based Fats Are Your Weight-Loss [place] Here's why eating the three Ps regularly will help you drop pounds. Protein fills you up. You need it to build lean muscle, which keeps your metabolism humming so that you can torch more fat. People in a weight-loss program who ate double the recommended daily allowance for protein (about 110 grams for a 150-pound woman) lost 70 percent of their weight from fat, while people who ate the RDA lost only about 40 percent, one study found. Produce is packed with filling fiber. "It's very difficult to consume too many calories if you're eating  a lot of vegetables. Example: Three cups of broccoli is a lot of food, yet only 93 calories. (Fruit is another story. It can be easy to overeat and can contain a lot of calories  from sugar, so be sure to monitor your intake.) Plant-based fats like olive oil and those in avocados and nuts are healthy and extra satiating.  Use it to lose it. Aim to incorporate each of the three Ps into every meal and snack. People who eat protein throughout the day are able to keep weight off, according to a study in the [employer]. In addition to meat, poultry and seafood, good sources are beans, lentils, eggs, tofu, and yogurt. As for fat, keep portion sizes in check by measuring out salad dressing, oil, and nut butters (shoot for one to two tablespoons). Finally, eat veggies or a little fruit at every meal. People who did that consumed 308 fewer calories but didn't feel any hungrier than when they didn't eat more produce.  7. How You Eat Is As Important As What You Eat In order for your brain to register that you're full, you need to focus on what you're eating. Sit down whenever you eat, preferably at a table. Turn off the TV or computer, put down your phone, and look at your food. Smell it. Chew slowly, and don't put another bite on your fork until you swallow. When women ate lunch this attentively, they consumed 30 percent less when snacking later than those who listened to an audiobook at lunchtime, according to a study in the [place] of Nutrition. 8. Weighing Yourself Really Works The scale provides the best evidence about whether your efforts are paying off. Seeing the numbers tick up or down or stagnate is motivation to keep going-or to rethink your approach. A 2015 study at [facility] found that daily weigh-ins helped people lose more weight, keep it off, and maintain that loss, even after two years. Use it to lose it. Step on the scale at the same time every day for the  best results. If your weight shoots up several pounds from one weigh-in to the next, don't freak out. Eating a lot of salt the night before or having your period is the likely culprit. The number should return to normal in a day or two. It's a steady climb that you need to do something about. 9. Too Much Stress and Too Little Sleep Are Your Enemies When you're tired and frazzled, your body cranks up the production of cortisol, the stress hormone that can cause carb cravings. Not getting enough sleep also boosts your levels of ghrelin, a hormone associated with hunger, while suppressing leptin, a hormone that signals fullness and satiety. People on a diet who slept only five and a half hours a night for two weeks lost 55 percent less fat and were hungrier than those who slept eight and a half hours, according to a study in the [place]. Use it to lose it. Prioritize sleep, aiming for seven hours or more a night, which research shows helps lower stress. And make sure you're getting quality zzz's. If a snoring spouse or a fidgety cat wakes you up frequently throughout the night, you may end up getting the equivalent of just four hours of sleep, according to a study from [facility]. Keep pets out of the bedroom, and use a white-noise app to drown out snoring. 10. You Will Hit a plateau-And You Can Bust Through It As you slim down, your body releases much less leptin, the fullness hormone.  If you're not strength training, start right now. Building muscle can raise your metabolism to help you overcome a plateau.  To keep your body challenged and burning calories, incorporate new moves and more intense intervals into your workouts or add another sweat session to your weekly routine. Alternatively, cut an extra 100 calories or so a day from your diet. Now that you've lost weight, your body simply doesn't need as much fuel.   Tarsal Tunnel Syndrome With Rehab Tarsal tunnel  syndrome is a condition that involves pressure (compression) on the nerve in the ankle (posterior tibial nerve) and results in pain and loss of feeling on the bottom of the foot. The nerve is usually compressed by other structures within the ankle. SYMPTOMS   Signs of nerve damage: pain, numbness, tingling, and loss of feeling along the bottom of the foot.  Pain that worsens with activity.  Feeling a lack of stability in the ankle. CAUSES  Tarsal tunnel syndrome is caused by structures within the ankle placing pressure on the nerve inside the ankle, which causes sensations in the bottom of the foot. Common sources of pressure include:  Ligament-like tissue (retinaculum) that covers the nerve area in the ankle (tarsal tunnel).  Bony spurs or bumps.  Inflamed tendons (tendonitis). RISK INCREASES WITH:  Stretched ankle ligaments, which create a loose joint.  Flat feet.  Arthritis of the ankle.  Inflammation of tendons in the foot and ankle.  Previous foot or ankle injury. PREVENTION  Warm up and stretch properly before activity.  Maintain physical fitness:  Strength, flexibility, and endurance.  Cardiovascular fitness (increases heart rate).  Wear properly fitted shoes.  Wear arch supports (orthotics), if you have flat feet.  Protect the ankle with taping, braces, or compression bandages. PROGNOSIS  If treated properly, the symptoms of tarsal tunnel syndrome usually go away with non-surgical treatment. Occasionally, surgery is necessary to free the compressed nerve.  RELATED COMPLICATIONS  Permanent nerve damage, including pain, numbness, tingling, or weakness in the ankle. TREATMENT Treatment first involves resting from any activities that aggravate the symptoms, as well as the use of ice and medicine to reduce pain and inflammation. The use of strengthening and stretching exercises may help reduce pain from activity. Other treatments include wearing arch supports, if you  have flat feet, and cross training (training in various physical activities) to reduce stress on the foot and ankle. If symptoms persist, despite non-surgical treatment, then surgery may be recommended. Surgery usually provides full relief from symptoms.  MEDICATION   If pain medicine is necessary, nonsteroidal anti-inflammatory medicines (aspirin and ibuprofen), or other minor pain relievers (acetaminophen), are often recommended.  Do not take pain medication for 7 days before surgery.  Prescription pain relievers may be given if your caregiver thinks they are necessary. Use only as directed and only as much as you need. HEAT AND COLD  Cold treatment (icing) relieves pain and reduces inflammation. Cold treatment should be applied for 10 to 15 minutes every 2 to 3 hours, and immediately after any activity that aggravates your symptoms. Use ice packs or an ice massage.  Heat treatment may be used prior to performing stretching and strengthening activities prescribed by your caregiver, physical therapist, or athletic trainer. Use a heat pack or a warm water soak. SEEK MEDICAL CARE IF:  Treatment does not seem to help, or the condition worsens.  Any medicines produce negative side effects.  Any complications from surgery occur:  Pain, numbness, or coldness in the affected foot.  Discoloration beneath the toenails (blue or gray) of the affected foot.  Signs of infections (fever, pain, inflammation, redness, or  persistent bleeding). EXERCISES RANGE OF MOTION (ROM) AND STRETCHING EXERCISES - Tarsal Tunnel Syndrome (Posterior Tibial Nerve Compression) These exercises may help you when beginning to restore activity to your injured foot. Complete these exercises with caution. Nerves are very sensitive tissue. They must be exercised gently. Never force a motion and do not push through discomfort. Notify your caregiver of any exercises which increase your pain or worsen your symptoms. Your symptoms  may go away with or without further involvement from your physician, physical therapist or athletic trainer. While completing these exercises, remember:   Restoring tissue flexibility helps normal motion to return to the joints. This allows healthier, less painful movement and activity.  An effective stretch should be held for at least 30 seconds.  A stretch should never be painful. You should only feel a gentle lengthening or release in the stretched tissue. STRETCH - Gastroc, Standing  Place hands on wall.  Extend right / left leg behind you and place a folded washcloth under the arch of your foot for support. Keep the front knee somewhat bent.  Slightly point your toes inward on your back foot.  Keeping your right / left heel on the floor and your knee straight, shift your weight toward the wall, not allowing your back to arch.  You should feel a gentle stretch in the right / left calf. Hold this position for __________ seconds. Repeat __________ times. Complete this stretch __________ times per day. STRETCH - Soleus, Standing  Place hands on wall.  Extend right / left leg behind you and place a folded washcloth under the arch of your foot for support. Keep the front knee somewhat bent.  Slightly point your toes inward on your back foot.  Keep your right / left heel on the floor, bend your back knee, and slightly shift your weight over the back leg so that you feel a gentle stretch deep in your back calf.  Hold this position for __________ seconds. Repeat __________ times. Complete this stretch __________ times per day. RANGE OF MOTION - Toe Extension, Flexion  Sit with your right / left leg crossed over your opposite knee.  Grasp your toes and gently pull them back toward the top of your foot. You should feel a stretch on the bottom of your toes and foot.  Hold this stretch for __________ seconds.  Now, gently pull your toes toward the bottom of your foot. You should feel a  stretch on the top of your toes and foot.  Hold this stretch for __________ seconds. Repeat __________ times. Complete this stretch __________ times per day.  RANGE OF MOTION - Ankle Eversion  Sit with your right / left ankle crossed over your opposite knee.  Grip your foot with your opposite hand, placing your thumb on the top of your foot and your fingers across the bottom of your foot.  Gently push your foot downward with a slight rotation so your littlest toes rise slightly toward the ceiling.  You should feel a gentle stretch on the inside of your ankle. Hold the stretch for __________ seconds. Repeat __________ times. Complete this exercise __________ times per day.  RANGE OF MOTION - Ankle Dorsiflexion, Active Assisted  Remove shoes and sit on a chair, preferably not on a carpeted surface.  Place right / left foot on the floor, directly under knee. Extend your opposite leg for support.  Keeping your heel down, slide your right / left foot back toward the chair until you feel a stretch  at your ankle or calf. If you do not feel a stretch, slide your bottom forward to the edge of the chair while still keeping your heel down.  Hold this stretch for __________ seconds. Repeat __________ times. Complete this stretch __________ times per day.  STRETCH - Hamstrings, Supine  Lie on your back. Loop a belt or towel over the ball of your right / left foot.  Straighten your right / left knee and slowly pull on the belt to raise your leg. Do not allow the right / left knee to bend. Keep your opposite leg flat on the floor.  Raise the leg until you feel a gentle stretch behind your right / left knee or thigh. Hold this position for __________ seconds. Repeat __________ times. Complete this stretch __________ times per day.  STRETCH - Hamstrings, Doorway  Lie on your back with your right / left leg extended and resting on the wall and the opposite leg flat on the ground through the door.  Initially, position your bottom farther away from the wall than the illustration shows.  Keep your right / left knee straight. If you feel a stretch behind your knee or thigh, hold this position for __________ seconds.  If you do not feel a stretch, scoot your bottom closer to the door and hold __________ seconds. Repeat __________ times. Complete this stretch __________ times per day.  STRETCH - Hamstrings, Standing  Stand or sit, and extend your right / left leg, placing your foot on a chair or foot stool  Keep a slight arch in your low back, and keep your hips straight forward.  Lead with your chest and lean forward at the waist, until you feel a gentle stretch in the back of your right / left knee or thigh. (When done correctly, this exercise requires leaning only a small distance.)  Hold this position for __________ seconds. Repeat __________ times. Complete this stretch __________ times per day. STRENGTHENING EXERCISES - Tarsal Tunnel Syndrome (Posterior Tibial Nerve Compression) These exercises may help you when beginning to restore activity to your injured foot. Your symptoms may go away with or without further involvement from your physician, physical therapist or athletic trainer. While completing these exercises, remember:   Muscles can gain both the endurance and the strength needed for everyday activities through controlled exercises.  Complete these exercises as instructed by your physician, physical therapist or athletic trainer. Increase the resistance and repetitions only as guided.  You may experience muscle soreness or fatigue, but the pain or discomfort you are trying to eliminate should never worsen during these exercises. If this pain does worsen, stop and make certain you are following the directions exactly. If the pain is still present after adjustments, discontinue the exercise until you can discuss the trouble with your caregiver. STRENGTH - Dorsiflexors  Secure a  rubber exercise band or tubing to a fixed object (table, pole) and loop the other end around your right / left foot.  Sit on the floor facing the fixed object. The band should be slightly tense when your foot is relaxed.  Slowly draw your foot back toward you, using your ankle and toes.  Hold this position for __________ seconds. Slowly release the tension in the band and return your foot to the starting position. Repeat __________ times. Complete this exercise __________ times per day.  STRENGTH - Plantar-flexors  Sit with your right / left leg extended. Holding onto both ends of a rubber exercise band or tubing, loop it  around the ball of your foot. Keep a slight tension in the band.  Slowly push your toes away from you, pointing them downward.  Hold this position for __________ seconds. Return to the starting position slowly, controlling the tension in the band. Repeat __________ times. Complete this exercise __________ times per day.  STRENGTH - Plantar-flexors, Standing  Stand with your feet shoulder width apart. Place your hands on a wall or table to steady yourself, using as little support as needed.  Keeping your weight evenly spread over the width of your feet, rise up on your toes.*  Hold this position for __________ seconds. Repeat __________ times. Complete this exercise __________ times per day.  *If this is too easy, shift your weight toward your right / left leg until you feel challenged. Ultimately, you may be asked to do this exercise while standing on your right / left foot only. STRENGTH - Towel Curls  Sit in a chair, on a non-carpeted surface.  Place your foot on a towel, keeping your heel on the floor.  Pull the towel toward your heel only by curling your toes. Keep your heel on the floor.  If instructed by your physician, physical therapist or athletic trainer, add ____________________ at the end of the towel. Repeat __________ times. Complete this exercise  __________ times per day. STRENGTH - Ankle Eversion  Secure one end of a rubber exercise band or tubing to a fixed object (table, pole). Loop the other end around your foot, just before your toes.  Place your fists between your knees. This will focus your strengthening at your ankle.  Drawing the band across your opposite foot, away from the pole, slowly pull your little toe out and up. Make sure the band is positioned to resist the entire motion.  Hold this position for __________ seconds.  Return to the starting position slowly, controlling the tension in the band. Repeat __________ times. Complete this exercise __________ times per day.  STRENGTH - Ankle Inversion  Secure one end of a rubber exercise band or tubing to a fixed object (table, pole). Loop the other end around your foot, just before your toes.  Place your fists between your knees. This will focus your strengthening at your ankle.  Slowly, pull your big toe up and in, making sure the band is positioned to resist the entire motion.  Hold this position for __________ seconds.  Return to the starting position slowly, controlling the tension in the band. Repeat __________ times. Complete this exercises __________ times per day.    This information is not intended to replace advice given to you by your health care provider. Make sure you discuss any questions you have with your health care provider.   Document Released: 07/11/2005 Document Revised: 11/25/2014 Document Reviewed: 10/23/2008 Elsevier Interactive Patient Education [employer].

## 2015-06-24 LAB — VITAMIN D 25 HYDROXY (VIT D DEFICIENCY, FRACTURES): Vit D, 25-Hydroxy: 32 ng/mL (ref 30–100)

## 2015-07-28 ENCOUNTER — Encounter: Payer: Self-pay | Admitting: Physician Assistant

## 2015-07-28 ENCOUNTER — Ambulatory Visit (INDEPENDENT_AMBULATORY_CARE_PROVIDER_SITE_OTHER): Payer: BC Managed Care – PPO | Admitting: Physician Assistant

## 2015-07-28 VITALS — BP 126/60 | HR 85 | Temp 97.5°F | Resp 16 | Ht 63.0 in | Wt 195.0 lb

## 2015-07-28 DIAGNOSIS — E669 Obesity, unspecified: Secondary | ICD-10-CM

## 2015-07-28 NOTE — Progress Notes (Signed)
Patient was suppose to start phentermine, has not started. Will get filled and then follow up in 1 month.

## 2015-09-07 ENCOUNTER — Other Ambulatory Visit: Payer: Self-pay

## 2015-09-07 ENCOUNTER — Ambulatory Visit (INDEPENDENT_AMBULATORY_CARE_PROVIDER_SITE_OTHER): Payer: BC Managed Care – PPO | Admitting: Physician Assistant

## 2015-09-07 ENCOUNTER — Encounter: Payer: Self-pay | Admitting: Physician Assistant

## 2015-09-07 VITALS — BP 130/80 | HR 78 | Temp 97.9°F | Resp 16 | Ht 63.0 in | Wt 194.8 lb

## 2015-09-07 DIAGNOSIS — E669 Obesity, unspecified: Secondary | ICD-10-CM

## 2015-09-07 DIAGNOSIS — E785 Hyperlipidemia, unspecified: Secondary | ICD-10-CM

## 2015-09-07 MED ORDER — PHENTERMINE HCL 37.5 MG PO TABS: 37.5000 mg | ORAL_TABLET | Freq: Every day | ORAL | Status: DC

## 2015-09-07 NOTE — Patient Instructions (Signed)
Can try melatonin 5mg-15 mg at night for sleep, can also do benadryl 25-50mg at night for sleep.  If this does not help we can try prescription medication.  Also here is some information about good sleep hygiene.   Insomnia Insomnia is frequent trouble falling and/or staying asleep. Insomnia can be a long term problem or a short term problem. Both are common. Insomnia can be a short term problem when the wakefulness is related to a certain stress or worry. Long term insomnia is often related to ongoing stress during waking hours and/or poor sleeping habits. Overtime, sleep deprivation itself can make the problem worse. Every little thing feels more severe because you are overtired and your ability to cope is decreased. CAUSES  Stress, anxiety, and depression. Poor sleeping habits. Distractions such as TV in the bedroom. Naps close to bedtime. Engaging in emotionally charged conversations before bed. Technical reading before sleep. Alcohol and other sedatives. They may make the problem worse. They can hurt normal sleep patterns and normal dream activity. Stimulants such as caffeine for several hours prior to bedtime. Pain syndromes and shortness of breath can cause insomnia. Exercise late at night. Changing time zones may cause sleeping problems (jet lag). It is sometimes helpful to have someone observe your sleeping patterns. They should look for periods of not breathing during the night (sleep apnea). They should also look to see how long those periods last. If you live alone or observers are uncertain, you can also be observed at a sleep clinic where your sleep patterns will be professionally monitored. Sleep apnea requires a checkup and treatment. Give your caregivers your medical history. Give your caregivers observations your family has made about your sleep.  SYMPTOMS  Not feeling rested in the morning. Anxiety and restlessness at bedtime. Difficulty falling and staying asleep. TREATMENT   Your caregiver may prescribe treatment for an underlying medical disorders. Your caregiver can give advice or help if you are using alcohol or other drugs for self-medication. Treatment of underlying problems will usually eliminate insomnia problems. Medications can be prescribed for short time use. They are generally not recommended for lengthy use. Over-the-counter sleep medicines are not recommended for lengthy use. They can be habit forming. You can promote easier sleeping by making lifestyle changes such as: Using relaxation techniques that help with breathing and reduce muscle tension. Exercising earlier in the day. Changing your diet and the time of your last meal. No night time snacks. Establish a regular time to go to bed. Counseling can help with stressful problems and worry. Soothing music and white noise may be helpful if there are background noises you cannot remove. Stop tedious detailed work at least one hour before bedtime. HOME CARE INSTRUCTIONS  Keep a diary. Inform your caregiver about your progress. This includes any medication side effects. See your caregiver regularly. Take note of: Times when you are asleep. Times when you are awake during the night. The quality of your sleep. How you feel the next day. This information will help your caregiver care for you. Get out of bed if you are still awake after 15 minutes. Read or do some quiet activity. Keep the lights down. Wait until you feel sleepy and go back to bed. Keep regular sleeping and waking hours. Avoid naps. Exercise regularly. Avoid distractions at bedtime. Distractions include watching television or engaging in any intense or detailed activity like attempting to balance the household checkbook. Develop a bedtime ritual. Keep a familiar routine of bathing, brushing your teeth,   at bedtime. Distractions include watching television or engaging in any intense or detailed activity like attempting to balance the household checkbook.  Develop a bedtime ritual. Keep a familiar routine of bathing, brushing your teeth, climbing into bed at the same time each night, listening to  soothing music. Routines increase the success of falling to sleep faster.  Use relaxation techniques. This can be using breathing and muscle tension release routines. It can also include visualizing peaceful scenes. You can also help control troubling or intruding thoughts by keeping your mind occupied with boring or repetitive thoughts like the old concept of counting sheep. You can make it more creative like imagining planting one beautiful flower after another in your backyard garden.  During your day, work to eliminate stress. When this is not possible use some of the previous suggestions to help reduce the anxiety that accompanies stressful situations. MAKE SURE YOU:   Understand these instructions.  Will watch your condition.  Will get help right away if you are not doing well or get worse. Document Released: 07/08/2000 Document Revised: 10/03/2011 Document Reviewed: 08/08/2007 [facility] Patient Information 2015 [place], [place]. This information is not intended to replace advice given to you by your health care provider. Make sure you discuss any questions you have with your health care provider.  About Constipation  Constipation Overview Constipation is the most common gastrointestinal complaint - about 4 million Americans experience constipation and make 2.5 million physician visits a year to get help for the problem.  Constipation can occur when the colon absorbs too much water, the colon's muscle contraction is slow or sluggish, and/or there is delayed transit time through the colon.  The result is stool that is hard and dry.  Indicators of constipation include straining during bowel movements greater than 25% of the time, having fewer than three bowel movements per week, and/or the feeling of incomplete evacuation.  There are established guidelines ([place] II ) for defining constipation. A person needs to have two or more of the following symptoms for at least 12 weeks (not necessarily  consecutive) in the preceding 12 months: . Straining in  greater than 25% of bowel movements . Lumpy or hard stools in greater than 25% of bowel movements . Sensation of incomplete emptying in greater than 25% of bowel movements . Sensation of anorectal obstruction/blockade in greater than 25% of bowel movements . Manual maneuvers to help empty greater than 25% of bowel movements (e.g., digital evacuation, support of the pelvic floor)  . Less than  3 bowel movements/week . Loose stools are not present, and criteria for irritable bowel syndrome are insufficient  Common Causes of Constipation . Lack of fiber in your diet . Lack of physical activity . Medications, including iron and calcium supplements  . Dairy intake . Dehydration . Abuse of laxatives  Travel  Irritable Bowel Syndrome  Pregnancy  Luteal phase of menstruation (after ovulation and before menses)  Colorectal problems  Intestinal Dysfunction  Treating Constipation  There are several ways of treating constipation, including changes to diet and exercise, use of laxatives, adjustments to the pelvic floor, and scheduled toileting.  These treatments include: . increasing fiber and fluids in the diet  . increasing physical activity . learning muscle coordination   learning proper toileting techniques and toileting modifications   designing and sticking  to a toileting schedule     2007, [employer] Doc.22

## 2015-09-07 NOTE — Progress Notes (Signed)
50 y.o.female presents for a follow up after being on phentermine for weight loss for 1 month. Patient states they have increased working out more, walking 30 mins and doing weights. While on the phentermine they have lost 1 lbs since last visit. They deny palpitations, anxiety, trouble sleeping, elevated BP, she has had some constipation and dry mouth. She works out in Hospital doctor.   Morning: yogurt triple zero, toast Lunch: salad/sandwich Dinner: baked chicken, veggies Snacks: oreo packs  Wt Readings from Last 3 Encounters:  09/07/15 194 lb 12.8 oz (88.361 kg)  07/28/15 195 lb (88.451 kg)  06/23/15 194 lb (87.998 kg)    Medications: Current Outpatient Prescriptions on File Prior to Visit  Medication Sig Dispense Refill  . ALPRAZolam (XANAX) 0.25 MG tablet Take 1 tablet (0.25 mg total) by mouth 2 (two) times daily as needed for anxiety. 60 tablet 1  . Biotin 10 MG CAPS Take by mouth daily.    Marland Kitchen ibuprofen (ADVIL,MOTRIN) 800 MG tablet Take 1 tablet (800 mg total) by mouth 3 (three) times daily. 21 tablet 0  . phentermine (ADIPEX-P) 37.5 MG tablet Take 1 tablet (37.5 mg total) by mouth daily before breakfast. 30 tablet 0  . rosuvastatin (CRESTOR) 20 MG tablet Take 1 tablet (20 mg total) by mouth daily. 30 tablet 3  . SUMAtriptan (IMITREX) 25 MG tablet Take once for headache, May repeat in 2 hours if headache persists or recurs. 10 tablet 2  . topiramate (TOPAMAX) 25 MG tablet Take 1 tablet (25 mg total) by mouth 2 (two) times daily. 180 tablet 1   No current facility-administered medications on file prior to visit.    ROS: All negative except for above  Physical exam: Filed Vitals:   09/07/15 0928  BP: 130/80  Pulse: 78  Temp: 97.9 F (36.6 C)  Resp: 16   BP 130/80 mmHg  Pulse 78  Temp(Src) 97.9 F (36.6 C)  Resp 16  Ht 5\' 3"  (1.6 m)  Wt 194 lb 12.8 oz (88.361 kg)  BMI 34.52 kg/m2  SpO2 98% General appearance: alert and mildly obese Ears: normal TM's and external ear  canals both ears Throat: lips, mucosa, and tongue normal; teeth and gums normal Abdomen: soft, non-tender; bowel sounds normal; no masses,  no organomegaly  Assessment: Obesity with co morbid conditions.   Plan: General weight loss/lifestyle modification strategies discussed (elicit support from others; identify saboteurs; non-food rewards, etc). Regular aerobic exercise program discussed. Medication: phentermine. Follow up in: 2 months and as needed.  No future appointments.

## 2015-10-01 ENCOUNTER — Encounter: Payer: Self-pay | Admitting: Emergency Medicine

## 2015-11-09 ENCOUNTER — Ambulatory Visit: Payer: Self-pay | Admitting: Physician Assistant

## 2015-11-29 ENCOUNTER — Encounter (HOSPITAL_COMMUNITY): Payer: Self-pay | Admitting: Emergency Medicine

## 2015-11-29 ENCOUNTER — Emergency Department (HOSPITAL_COMMUNITY)
Admission: EM | Admit: 2015-11-29 | Discharge: 2015-11-29 | Disposition: A | Discharge status: Home / Self Care | Payer: BC Managed Care – PPO | Attending: Emergency Medicine | Admitting: Emergency Medicine

## 2015-11-29 DIAGNOSIS — R519 Headache, unspecified: Secondary | ICD-10-CM

## 2015-11-29 DIAGNOSIS — J45909 Unspecified asthma, uncomplicated: Secondary | ICD-10-CM | POA: Diagnosis not present

## 2015-11-29 DIAGNOSIS — Z87891 Personal history of nicotine dependence: Secondary | ICD-10-CM | POA: Insufficient documentation

## 2015-11-29 DIAGNOSIS — Z87448 Personal history of other diseases of urinary system: Secondary | ICD-10-CM | POA: Diagnosis not present

## 2015-11-29 DIAGNOSIS — F419 Anxiety disorder, unspecified: Secondary | ICD-10-CM | POA: Diagnosis not present

## 2015-11-29 DIAGNOSIS — R51 Headache: Secondary | ICD-10-CM | POA: Diagnosis present

## 2015-11-29 DIAGNOSIS — R110 Nausea: Secondary | ICD-10-CM | POA: Diagnosis not present

## 2015-11-29 DIAGNOSIS — Z791 Long term (current) use of non-steroidal anti-inflammatories (NSAID): Secondary | ICD-10-CM | POA: Diagnosis not present

## 2015-11-29 DIAGNOSIS — H53149 Visual discomfort, unspecified: Secondary | ICD-10-CM | POA: Insufficient documentation

## 2015-11-29 DIAGNOSIS — Z79899 Other long term (current) drug therapy: Secondary | ICD-10-CM | POA: Insufficient documentation

## 2015-11-29 DIAGNOSIS — E785 Hyperlipidemia, unspecified: Secondary | ICD-10-CM | POA: Insufficient documentation

## 2015-11-29 MED ORDER — DEXAMETHASONE SODIUM PHOSPHATE 10 MG/ML IJ SOLN
10.0000 mg | Freq: Once | INTRAMUSCULAR | Status: AC
  Administered 2015-11-29: 10 mg via INTRAVENOUS
  Filled 2015-11-29: qty 1

## 2015-11-29 MED ORDER — SODIUM CHLORIDE 0.9 % IV SOLN
INTRAVENOUS | Status: DC
  Administered 2015-11-29: 21:00:00 via INTRAVENOUS

## 2015-11-29 MED ORDER — DIPHENHYDRAMINE HCL 50 MG/ML IJ SOLN
25.0000 mg | Freq: Once | INTRAMUSCULAR | Status: AC
  Administered 2015-11-29: 25 mg via INTRAVENOUS
  Filled 2015-11-29: qty 1

## 2015-11-29 MED ORDER — METOCLOPRAMIDE HCL 5 MG/ML IJ SOLN
10.0000 mg | Freq: Once | INTRAMUSCULAR | Status: AC
  Administered 2015-11-29: 10 mg via INTRAVENOUS
  Filled 2015-11-29: qty 2

## 2015-11-29 NOTE — ED Provider Notes (Signed)
CSN: [id number]     Arrival date & time 11/29/15  1747 History   First MD Initiated Contact with Patient 11/29/15 2004     Chief Complaint  Patient presents with  . Migraine     (Consider location/radiation/quality/duration/timing/severity/associated sxs/prior Treatment) Patient is a 50 y.o. female presenting with migraines. The history is provided by the patient.  Migraine This is a new problem. The current episode started in the past 7 days. The problem occurs constantly. The problem has been gradually worsening. Associated symptoms include headaches and nausea. Treatments tried: migraine meds at home. The treatment provided no relief.   Daleisa BRIANE FALLIN is a 50 y.o. female who presents to the ED with a headache that started 3 days ago. She reports that the headache started on the right side around her eye as her usual migraine. She took her medications that she has but they have not helped. She has not been able to eat due to nausea and is drinking a little.   Past Medical History  Diagnosis Date  . Hyperlipidemia   . Anxiety   . Asthma   . Migraine   . Vitamin D deficiency   . Benign hematuria 2011    [place]   Past Surgical History  Procedure Laterality Date  . Leep    . Abdominal hysterectomy  1998  . Tonsilectomy/adenoidectomy with myringotomy     Family History  Problem Relation Age of Onset  . Hyperlipidemia Mother   . Diabetes Paternal Grandmother   . Heart disease Maternal Aunt   . Cancer Maternal Aunt   . Cancer Maternal Uncle   . Depression Other   . Alzheimer's disease Other    Social History  Substance Use Topics  . Smoking status: Former Research scientist (life sciences)  . Smokeless tobacco: None  . Alcohol Use: No   OB History    No data available     Review of Systems  Eyes: Positive for photophobia.  Gastrointestinal: Positive for nausea.  Neurological: Positive for headaches.  all other systems negative    Allergies  Shellfish allergy and Shellfish-derived  products  Home Medications   Prior to Admission medications   Medication Sig Start Date End Date Taking? Authorizing Provider  albuterol (PROVENTIL HFA) 108 (90 Base) MCG/ACT inhaler 2 puffs. 09/23/14   Historical Provider, MD  ALPRAZolam Duanne Moron) 0.25 MG tablet Take 1 tablet (0.25 mg total) by mouth 2 (two) times daily as needed for anxiety. 06/23/15   Vicie Mutters, PA-C  B Complex Vitamins (VITAMIN-B COMPLEX) TABS Take 1 tablet by mouth.    Historical Provider, MD  Biotin 10 MG CAPS Take by mouth daily.    Historical Provider, MD  ibuprofen (ADVIL,MOTRIN) 800 MG tablet Take 1 tablet (800 mg total) by mouth 3 (three) times daily. 04/11/12   Ezequiel Essex, MD  Magnesium Sulfate 70 MG CAPS Take 250 mg by mouth.    Historical Provider, MD  phentermine (ADIPEX-P) 37.5 MG tablet Take 1 tablet (37.5 mg total) by mouth daily before breakfast. 09/07/15   Vicie Mutters, PA-C  pravastatin (PRAVACHOL) 40 MG tablet Take 40 mg by mouth.    Historical Provider, MD  rosuvastatin (CRESTOR) 20 MG tablet Take 1 tablet (20 mg total) by mouth daily. 06/23/15   Vicie Mutters, PA-C  SUMAtriptan (IMITREX) 25 MG tablet Take once for headache, May repeat in 2 hours if headache persists or recurs. 06/23/15   Vicie Mutters, PA-C  topiramate (TOPAMAX) 25 MG tablet Take 1 tablet (25 mg total) by mouth  2 (two) times daily. 02/20/15   Vicie Mutters, PA-C   BP 131/80 mmHg  Pulse 70  Temp(Src) 98.9 F (37.2 C) (Oral)  Resp 18  Ht 5\' 3"  (1.6 m)  Wt 86.183 kg  BMI 33.67 kg/m2  SpO2 99% Physical Exam  Constitutional: She is oriented to person, place, and time. She appears well-developed and well-nourished. No distress.  HENT:  Head: Normocephalic and atraumatic.  Right Ear: Tympanic membrane normal.  Left Ear: Tympanic membrane normal.  Nose: Nose normal.  Mouth/Throat: Uvula is midline, oropharynx is clear and moist and mucous membranes are normal.  Eyes: Conjunctivae and EOM are normal.  Neck: Normal range of  motion. Neck supple.  Cardiovascular: Normal rate and regular rhythm.   Pulmonary/Chest: Effort normal. She has no wheezes. She has no rales.  Abdominal: Soft. Bowel sounds are normal. She exhibits no mass. There is no tenderness.  Musculoskeletal: She exhibits no edema.  Radial and pedal pulses strong, adequate circulation, good touch sensation.  Neurological: She is alert and oriented to person, place, and time. She has normal strength. No cranial nerve deficit or sensory deficit. She displays a negative Romberg sign. Gait normal.  Reflex Scores:      Bicep reflexes are 2+ on the right side and 2+ on the left side.      Brachioradialis reflexes are 2+ on the right side and 2+ on the left side.      Patellar reflexes are 2+ on the right side and 2+ on the left side.      Achilles reflexes are 2+ on the right side and 2+ on the left side. Rapid alternating movement without difficulty. Stands on one foot without difficulty.  Skin: Skin is warm and dry.  Psychiatric: She has a normal mood and affect. Her behavior is normal.  Nursing note and vitals reviewed.   ED Course  Procedures (including critical care time) Labs Review Labs Reviewed - No data to display  Imaging   MDM  After IV fluids and migraine cocktail patient reports her headache has resolved. Able to take PO fluids without n/v. Stable for d/c without focal neuro deficits.   Final diagnoses:  Headache, unspecified headache type       [facility], NP 11/29/15 2206  Orlie Dakin, MD 11/30/15 [phone]

## 2015-11-29 NOTE — ED Notes (Signed)
C/o migraine with nausea and light sensitivity x 3 days.  Taking Topiramate 25 mg BID without relief.

## 2015-11-29 NOTE — Discharge Instructions (Signed)
Follow up with your doctor.  Be sure you are drinking plenty of fluids so you don't get dehydrated.  Return as needed for any problems.

## 2015-12-01 ENCOUNTER — Ambulatory Visit (INDEPENDENT_AMBULATORY_CARE_PROVIDER_SITE_OTHER): Payer: BC Managed Care – PPO | Admitting: Physician Assistant

## 2015-12-01 ENCOUNTER — Encounter: Payer: Self-pay | Admitting: Physician Assistant

## 2015-12-01 VITALS — BP 118/66 | HR 68 | Temp 97.5°F | Resp 16 | Ht 63.0 in | Wt 192.4 lb

## 2015-12-01 DIAGNOSIS — E785 Hyperlipidemia, unspecified: Secondary | ICD-10-CM

## 2015-12-01 DIAGNOSIS — G43809 Other migraine, not intractable, without status migrainosus: Secondary | ICD-10-CM

## 2015-12-01 DIAGNOSIS — Z79899 Other long term (current) drug therapy: Secondary | ICD-10-CM | POA: Insufficient documentation

## 2015-12-01 DIAGNOSIS — E559 Vitamin D deficiency, unspecified: Secondary | ICD-10-CM

## 2015-12-01 DIAGNOSIS — E669 Obesity, unspecified: Secondary | ICD-10-CM | POA: Insufficient documentation

## 2015-12-01 LAB — BASIC METABOLIC PANEL WITH GFR
BUN: 12 mg/dL (ref 7–25)
CHLORIDE: 104 mmol/L (ref 98–110)
CO2: 25 mmol/L (ref 20–31)
Calcium: 9.1 mg/dL (ref 8.6–10.4)
Creat: 0.76 mg/dL (ref 0.50–1.05)
GFR, Est African American: >89 mL/min (ref >=60)
Glucose, Bld: 86 mg/dL (ref 65–99)
Potassium: 3.8 mmol/L (ref 3.5–5.3)
SODIUM: 138 mmol/L (ref 135–146)

## 2015-12-01 LAB — CBC WITH DIFFERENTIAL/PLATELET
BASOS ABS: 0 {cells}/uL (ref 0–200)
Basophils Relative: 0 %
EOS PCT: 2 %
Eosinophils Absolute: 244 cells/uL (ref 15–500)
HCT: 44 % (ref 35.0–45.0)
Hemoglobin: 14.9 g/dL (ref 11.7–15.5)
LYMPHS ABS: 5734 {cells}/uL — AB (ref 850–3900)
Lymphocytes Relative: 47 %
MCH: 28.5 pg (ref 27.0–33.0)
MCHC: 33.9 g/dL (ref 32.0–36.0)
MCV: 84.1 fL (ref 80.0–100.0)
MONO ABS: 732 {cells}/uL (ref 200–950)
MPV: 8.7 fL (ref 7.5–12.5)
Monocytes Relative: 6 %
NEUTROS ABS: 5490 {cells}/uL (ref 1500–7800)
Neutrophils Relative %: 45 %
Platelets: 349 10*3/uL (ref 140–400)
RBC: 5.23 MIL/uL — AB (ref 3.80–5.10)
RDW: 13.2 % (ref 11.0–15.0)
WBC: 12.2 10*3/uL — ABNORMAL HIGH (ref 3.8–10.8)

## 2015-12-01 LAB — LIPID PANEL
CHOL/HDL RATIO: 6.5 ratio — AB (ref <=5.0)
Cholesterol: 260 mg/dL — ABNORMAL HIGH (ref 125–200)
HDL: 40 mg/dL — ABNORMAL LOW (ref >=46)
LDL CALC: 180 mg/dL — AB (ref <130)
Triglycerides: 201 mg/dL — ABNORMAL HIGH (ref <150)
VLDL: 40 mg/dL — AB (ref <30)

## 2015-12-01 LAB — HEPATIC FUNCTION PANEL
ALK PHOS: 77 U/L (ref 33–130)
ALT: 18 U/L (ref 6–29)
AST: 13 U/L (ref 10–35)
Albumin: 4 g/dL (ref 3.6–5.1)
BILIRUBIN DIRECT: 0.1 mg/dL (ref <=0.2)
BILIRUBIN INDIRECT: 0.3 mg/dL (ref 0.2–1.2)
Total Bilirubin: 0.4 mg/dL (ref 0.2–1.2)
Total Protein: 6.8 g/dL (ref 6.1–8.1)

## 2015-12-01 LAB — TSH: TSH: 1.19 mIU/L

## 2015-12-01 LAB — HEMOGLOBIN A1C
HEMOGLOBIN A1C: 5.6 % (ref <5.7)
MEAN PLASMA GLUCOSE: 114 mg/dL

## 2015-12-01 LAB — MAGNESIUM: Magnesium: 2 mg/dL (ref 1.5–2.5)

## 2015-12-01 MED ORDER — ONDANSETRON HCL 4 MG PO TABS: 4.0000 mg | ORAL_TABLET | Freq: Three times a day (TID) | ORAL | Status: DC | PRN

## 2015-12-01 MED ORDER — PREDNISONE 20 MG PO TABS: ORAL_TABLET | ORAL | Status: DC

## 2015-12-01 MED ORDER — SUMATRIPTAN 20 MG/ACT NA SOLN: 20.0000 mg | NASAL | Status: DC | PRN

## 2015-12-01 MED ORDER — VERAPAMIL HCL ER 120 MG PO TBCR: 120.0000 mg | EXTENDED_RELEASE_TABLET | Freq: Every day | ORAL | Status: DC

## 2015-12-01 MED ORDER — AMOXICILLIN-POT CLAVULANATE 875-125 MG PO TABS: 1.0000 | ORAL_TABLET | Freq: Two times a day (BID) | ORAL | Status: DC

## 2015-12-01 NOTE — Progress Notes (Signed)
Assessment and Plan:  1. Hypertension -Continue medication, monitor blood pressure at home. Continue DASH diet.  Reminder to go to the ER if any CP, SOB, nausea, dizziness, severe HA, changes vision/speech, left arm numbness and tingling and jaw pain.  2. Cholesterol -Continue diet and exercise. Check cholesterol.   3. Obesity with co morbidities - long discussion about weight loss, diet, and exercise, will start the patient on phentermine- hand out given and AE's discussed, will do close follow up.   4. Vitamin D Def - check level and continue medications.   5. Migraines Supitriptan nasal since can not tolerate pill for nausea, ABX for possible abscess and follow up dentist, prednisone, zofran.  Stop tomapax and start on verapamil 120 at night Will refer to neurology- ? Cluster versus migraines  Continue diet and meds as discussed. Further disposition pending results of labs. Over 30 minutes of exam, counseling, chart review, and critical decision making was performed  No future appointments. NEEDS CPE  HPI 50 y.o. female  presents for 4 month follow up on hypertension, cholesterol, migraines, and vitamin D deficiency.   Her blood pressure has been controlled at home, today their BP is BP: 118/66 mmHg  She does not workout. She denies chest pain, shortness of breath, dizziness.  She is on cholesterol medication crestor 10mg  with negative CPK last visit, but has not had since 1 month, needs generic.   Her cholesterol is not at goal. The cholesterol last visit was:   Lab Results  Component Value Date   CHOL 292* 06/23/2015   HDL 41* 06/23/2015   LDLCALC 226* 06/23/2015   TRIG 126 06/23/2015   CHOLHDL 7.1* 06/23/2015    Last A1C in the office was:  Lab Results  Component Value Date   HGBA1C 5.5 02/20/2015   She was going to headache clinic x 6 months but states that they did not help, had worsening with weather change, has had HA x thursday,  went to ER 11/29/2015 for headache  that was better with medications, normal neuro however has returned. She has right sided HA behind eye/temple, has had eye watering, ear popping, sensitivity to light and sound with nausea. Imitrex causes nausea/vomiting.   Patient is on Vitamin D supplement, 5000 IU 2 a day.    Lab Results  Component Value Date   VD25OH 32 06/23/2015     In addition she has smoking history with hematuria, sent to urology for referral, with normal evaluation.  BMI is Body mass index is 34.09 kg/(m^2)., she is working on diet and exercise. Wt Readings from Last 3 Encounters:  12/01/15 192 lb 6.4 oz (87.272 kg)  11/29/15 190 lb (86.183 kg)  09/07/15 194 lb 12.8 oz (88.361 kg)     Current Medications:  Current Outpatient Prescriptions on File Prior to Visit  Medication Sig Dispense Refill  . albuterol (PROVENTIL HFA) 108 (90 Base) MCG/ACT inhaler 2 puffs.    . ALPRAZolam (XANAX) 0.25 MG tablet Take 1 tablet (0.25 mg total) by mouth 2 (two) times daily as needed for anxiety. 60 tablet 1  . B Complex Vitamins (VITAMIN-B COMPLEX) TABS Take 1 tablet by mouth.    . Biotin 10 MG CAPS Take by mouth daily.    Marland Kitchen ibuprofen (ADVIL,MOTRIN) 800 MG tablet Take 1 tablet (800 mg total) by mouth 3 (three) times daily. 21 tablet 0  . Magnesium Sulfate 70 MG CAPS Take 250 mg by mouth.    . pravastatin (PRAVACHOL) 40 MG tablet Take 40  mg by mouth.    . rosuvastatin (CRESTOR) 20 MG tablet Take 1 tablet (20 mg total) by mouth daily. 30 tablet 3  . topiramate (TOPAMAX) 25 MG tablet Take 1 tablet (25 mg total) by mouth 2 (two) times daily. 180 tablet 1   No current facility-administered medications on file prior to visit.   Medical History:  Past Medical History  Diagnosis Date  . Hyperlipidemia   . Anxiety   . Asthma   . Migraine   . Vitamin D deficiency   . Benign hematuria 2011    Ottelin   Allergies:  Allergies  Allergen Reactions  . Shellfish Allergy Anaphylaxis  . Shellfish-Derived Products Anaphylaxis      Review of Systems:  Review of Systems  Constitutional: Negative.  Negative for fever, chills and malaise/fatigue.  HENT: Positive for congestion and ear pain (right). Negative for ear discharge, hearing loss, nosebleeds, sore throat and tinnitus.   Eyes: Positive for blurred vision, photophobia, discharge and redness. Negative for double vision and pain.  Respiratory: Negative.  Negative for shortness of breath and stridor.   Cardiovascular: Negative.  Negative for chest pain.  Gastrointestinal: Positive for constipation. Negative for heartburn, nausea, vomiting, abdominal pain, diarrhea, blood in stool and melena.  Genitourinary: Negative.   Musculoskeletal: Positive for myalgias and back pain. Negative for joint pain, falls and neck pain.  Skin: Negative.   Neurological: Positive for headaches. Negative for dizziness, tingling, tremors, sensory change, speech change, focal weakness, seizures and loss of consciousness.  Psychiatric/Behavioral: Negative.  Negative for suicidal ideas. The patient is not nervous/anxious.   All other systems reviewed and are negative.   Family history- Review and unchanged Social history- Review and unchanged Physical Exam: BP 118/66 mmHg  Pulse 68  Temp(Src) 97.5 F (36.4 C) (Temporal)  Resp 16  Ht 5\' 3"  (1.6 m)  Wt 192 lb 6.4 oz (87.272 kg)  BMI 34.09 kg/m2  SpO2 98% Wt Readings from Last 3 Encounters:  12/01/15 192 lb 6.4 oz (87.272 kg)  11/29/15 190 lb (86.183 kg)  09/07/15 194 lb 12.8 oz (88.361 kg)   General Appearance: Well nourished, in no apparent distress. Eyes: PERRLA, EOMs, conjunctiva no swelling or erythema,  Right lacrimal tearing.  Sinuses: No Frontal/maxillary tenderness ENT/Mouth: Ext aud canals clear, TMs without erythema, bulging. No erythema, swelling, or exudate on post pharynx. Right gums with mild swelling/erythema and tenderenss to palpation, no palptaed fluctuance. Tonsils not swollen or erythematous. Hearing normal.   Neck: Supple, thyroid normal.  Respiratory: Respiratory effort normal, BS equal bilaterally without rales, rhonchi, wheezing or stridor.  Cardio: RRR with no MRGs. Brisk peripheral pulses without edema.  Abdomen: Soft, + BS,  Non tender, no guarding, rebound, hernias, masses. Lymphatics: Non tender without lymphadenopathy.  Musculoskeletal: Full ROM, 5/5 strength, Normal gait. Skin: Warm, dry without rashes, lesions, ecchymosis.  Neuro: Cranial nerves intact. Normal muscle tone, no cerebellar symptoms. Psych: Awake and oriented X 3, normal affect, Insight and Judgment appropriate.    Vicie Mutters, PA-C 11:02 AM [facility]

## 2015-12-01 NOTE — Patient Instructions (Signed)
Stop tomapax Stop chewing gum  Start on verapamil 120 at night to prevent migraines  Take the prednisone and can take zantac if your stomach hurts with it.   Go see your dentist  zofran as needed for nausea  Put in referral for neuro.

## 2015-12-02 LAB — VITAMIN D 25 HYDROXY (VIT D DEFICIENCY, FRACTURES): Vit D, 25-Hydroxy: 18 ng/mL — ABNORMAL LOW (ref 30–100)

## 2015-12-11 ENCOUNTER — Encounter: Payer: Self-pay | Admitting: Neurology

## 2015-12-11 ENCOUNTER — Ambulatory Visit (INDEPENDENT_AMBULATORY_CARE_PROVIDER_SITE_OTHER): Payer: BC Managed Care – PPO | Admitting: Neurology

## 2015-12-11 VITALS — BP 132/70 | HR 76 | Ht 64.0 in | Wt 196.0 lb

## 2015-12-11 DIAGNOSIS — R519 Headache, unspecified: Secondary | ICD-10-CM

## 2015-12-11 DIAGNOSIS — R51 Headache: Secondary | ICD-10-CM

## 2015-12-11 DIAGNOSIS — G43009 Migraine without aura, not intractable, without status migrainosus: Secondary | ICD-10-CM | POA: Diagnosis not present

## 2015-12-11 NOTE — Progress Notes (Signed)
[facility] Clinic Note - Initial Visit   Date: 12/11/2015  CHESTERINE KREMER MRN: [id number] DOB: 1965-07-29   Dear Vicie Mutters, PA:  Thank you for your kind referral of Kimberly Zhang for consultation of migraines. Although her history is well known to you, please allow [place] to reiterate it for the purpose of our medical record. The patient was accompanied to the clinic by self.   History of Present Illness: Kimberly Zhang is a 50 y.o. right-handed African American female with asthma, hyperlipidemia, vitamin D deficiency, and asthma presenting for evaluation of migraines.    She reports having headaches since her 70s which were tolerable and did not require preventative medication. Her headaches gradually worsened in her 52s and since then have become more intense.  She was taking various preventative medications, but does not recall the names.  For the past several years, she was on topiramate 25mg  daily.  Over the past year, she was getting severe migraine twice per month.  Barometric pressure changes trigger headaches.  Pain involves the right frontal, temporal region described as throbbing and pounding.  Duration is usually 1-3 days, occuring about twice per week.  She endorses nausea and photophobia.    She was doing well with respect to her migraines until early May, when she developed severe intense migraine which did not respond to her usual medications so went to the emergency department.  She was given headache cocktail alleviated her pain. She reports having conjunctival injection often, even without headaches.  She saw her PCP after her ER visit who stopped her topiramate and started verapamil 120mg . It is too early to appreciate any effects of verapamil since she has only been on this for a week.  She has taken imitrex nasal spray twice over the past week, which alleviates the headaches.  She had a dull daily headache about 3-4 days per week,  which responds to execdrin migraine.     Out-side paper records, electronic medical record, and images have been reviewed where available and summarized as:  CT head 04/11/2012:  Unremarkable CT cervical spine 04/11/2012:  Unremarkable   Lab Results  Component Value Date   TSH 1.19 12/01/2015   Lab Results  Component Value Date   HGBA1C 5.6 12/01/2015   Lab Results  Component Value Date   VITAMINB12 252 02/20/2015    Past Medical History  Diagnosis Date  . Hyperlipidemia   . Anxiety   . Asthma   . Migraine   . Vitamin D deficiency   . Benign hematuria 2011    Ottelin    Past Surgical History  Procedure Laterality Date  . Leep    . Abdominal hysterectomy  1998  . Tonsilectomy/adenoidectomy with myringotomy       Medications:  Outpatient Encounter Prescriptions as of 12/11/2015  Medication Sig Note  . albuterol (PROVENTIL HFA) 108 (90 Base) MCG/ACT inhaler 2 puffs. 09/07/2015: Received from: [facility]  . ALPRAZolam (XANAX) 0.25 MG tablet Take 1 tablet (0.25 mg total) by mouth 2 (two) times daily as needed for anxiety.   Marland Kitchen aspirin-acetaminophen-caffeine (EXCEDRIN MIGRAINE) 250-250-65 MG tablet Take by mouth every 6 (six) hours as needed for headache.   . B Complex Vitamins (VITAMIN-B COMPLEX) TABS Take 1 tablet by mouth. 09/07/2015: Received from: [facility]  . Biotin 10 MG CAPS Take by mouth daily.   Marland Kitchen ibuprofen (ADVIL,MOTRIN) 800 MG tablet Take 1 tablet (800 mg total) by mouth 3 (  three) times daily.   . ondansetron (ZOFRAN) 4 MG tablet Take 1 tablet (4 mg total) by mouth every 8 (eight) hours as needed for nausea or vomiting.   . pravastatin (PRAVACHOL) 40 MG tablet Take 40 mg by mouth. 09/07/2015: Received from: [facility]  . SUMAtriptan (IMITREX) 20 MG/ACT nasal spray Place 1 spray (20 mg total) into the nose every 2 (two) hours as needed for migraine or headache (if head persist).   . verapamil  (CALAN-SR) 120 MG CR tablet Take 1 tablet (120 mg total) by mouth at bedtime.   . predniSONE (DELTASONE) 20 MG tablet 2 tablets daily for 3 days, 1 tablet daily for 4 days. (Patient not taking: Reported on 12/11/2015)   . [DISCONTINUED] amoxicillin-clavulanate (AUGMENTIN) 875-125 MG tablet Take 1 tablet by mouth 2 (two) times daily.   . [DISCONTINUED] Magnesium Sulfate 70 MG CAPS Take 250 mg by mouth. 09/07/2015: Received from: [facility]  . [DISCONTINUED] rosuvastatin (CRESTOR) 20 MG tablet Take 1 tablet (20 mg total) by mouth daily.    No facility-administered encounter medications on file as of 12/11/2015.     Allergies:  Allergies  Allergen Reactions  . Shellfish Allergy Anaphylaxis  . Shellfish-Derived Products Anaphylaxis    Family History: Family History  Problem Relation Age of Onset  . Hyperlipidemia Mother   . Diabetes Paternal Grandmother   . Heart disease Maternal Aunt   . Cancer Maternal Aunt   . Cancer Maternal Uncle   . Depression Other   . Alzheimer's disease Other   . Migraines Mother   . Liver disease Father   . Healthy Sister   . Healthy Son   . Healthy Daughter     Social History: Social History  Substance Use Topics  . Smoking status: Former Smoker    Quit date: 09/23/2015  . Smokeless tobacco: Not on file  . 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      Review of Systems:  CONSTITUTIONAL: No fevers, chills, night sweats, or weight loss.   EYES: No visual changes or eye pain ENT: No hearing changes.  No history of nose bleeds.   RESPIRATORY: No cough, wheezing and shortness of breath.   CARDIOVASCULAR: Negative for chest pain, and palpitations.   GI: Negative for abdominal discomfort, blood in stools or black stools.  No recent change in bowel habits.   GU:  No history of  incontinence.   MUSCLOSKELETAL: No history of joint pain or swelling.  No myalgias.   SKIN: Negative for lesions, rash, and itching.   HEMATOLOGY/ONCOLOGY: Negative for prolonged bleeding, bruising easily, and swollen nodes.  No history of cancer.   ENDOCRINE: Negative for cold or heat intolerance, polydipsia or goiter.   PSYCH:  + depression or anxiety symptoms.   NEURO: As Above.   Vital Signs:  BP 132/70 mmHg  Pulse 76  Ht 5\' 4"  (1.626 m)  Wt 196 lb (88.905 kg)  BMI 33.63 kg/m2 Pain Scale: 0 on a scale of 0-10   General Medical Exam:   General:  Well appearing, comfortable.   Eyes/ENT: see cranial nerve examination.   Neck: No masses appreciated.  Full range of motion without tenderness.  No carotid bruits. Respiratory:  Clear to auscultation, good air entry  bilaterally.   Cardiac:  Regular rate and rhythm, no murmur.   Extremities:  No deformities, edema, or skin discoloration.  Skin:  No rashes or lesions.  Neurological Exam: MENTAL STATUS including orientation to time, place, person, recent and remote memory, attention span and concentration, language, and fund of knowledge is normal.  Speech is not dysarthric.  CRANIAL NERVES: II:  No visual field defects.  Unremarkable fundi.   III-IV-VI: Pupils equal round and reactive to light.  Normal conjugate, extra-ocular eye movements in all directions of gaze.  No nystagmus.  No ptosis.   V:  Normal facial sensation.     VII:  Normal facial symmetry and movements.  VIII:  Normal hearing and vestibular function.   IX-X:  Normal palatal movement.   XI:  Normal shoulder shrug and head rotation.   XII:  Normal tongue strength and range of motion, no deviation or fasciculation.  MOTOR:  No atrophy, fasciculations or abnormal movements.  No pronator drift.  Tone is normal.    Right Upper Extremity:    Left Upper Extremity:    Deltoid  5/5   Deltoid  5/5   Biceps  5/5   Biceps  5/5   Triceps  5/5   Triceps  5/5   Wrist extensors   5/5   Wrist extensors  5/5   Wrist flexors  5/5   Wrist flexors  5/5   Finger extensors  5/5   Finger extensors  5/5   Finger flexors  5/5   Finger flexors  5/5   Dorsal interossei  5/5   Dorsal interossei  5/5   Abductor pollicis  5/5   Abductor pollicis  5/5   Tone (Ashworth scale)  0  Tone (Ashworth scale)  0   Right Lower Extremity:    Left Lower Extremity:    Hip flexors  5/5   Hip flexors  5/5   Hip extensors  5/5   Hip extensors  5/5   Knee flexors  5/5   Knee flexors  5/5   Knee extensors  5/5   Knee extensors  5/5   Dorsiflexors  5/5   Dorsiflexors  5/5   Plantarflexors  5/5   Plantarflexors  5/5   Toe extensors  5/5   Toe extensors  5/5   Toe flexors  5/5   Toe flexors  5/5   Tone (Ashworth scale)  0  Tone (Ashworth scale)  0   MSRs:  Right                                                                 Left brachioradialis 2+  brachioradialis 2+  biceps 2+  biceps 2+  triceps 2+  triceps 2+  patellar 2+  patellar 2+  ankle jerk 2+  ankle jerk 2+  Hoffman no  Hoffman no  plantar response down  plantar response down   SENSORY:  Normal and symmetric perception of light touch, pinprick, vibration, and proprioception.  Romberg's sign absent.   COORDINATION/GAIT: Normal finger-to- nose-finger and heel-to-shin.  Intact rapid alternating movements bilaterally.  Able to rise from a chair without using arms.  Gait narrow based and stable. Tandem and stressed gait intact.    IMPRESSION/PLAN: 1.  Chronic daily headaches 2.  Episodic  migraine without aura, less likely cluster headaches due to lack of autonomic features  Patient recently was started on verapamil 120mg  daily as a daily preventative medication, which will be continued.  If headaches do not respond to this, I would like to try her on a higher dose of topiramate as she may have been subtherapeutic on the dose. She may also try magnesium oxide 400-600mg  daily as a preventative agent.  She has getting significant  relief with imitex nasal spray.  I stressed the importance of limiting all rescue medications to twice per week.  Return to clinic in 3 months.   The duration of this appointment visit was 35 minutes of face-to-face time with the patient.  Greater than 50% of this time was spent in counseling, explanation of diagnosis, planning of further management, and coordination of care.   Thank you for allowing me to participate in patient's care.  If I can answer any additional questions, I would be pleased to do so.    Sincerely,    Donika K. Posey Pronto, DO

## 2015-12-11 NOTE — Patient Instructions (Addendum)
You can try magnesium 400-600mg  daily Continue verapamil as you are taking Continue imitrex but limit to twice per week  Return to clinic in 27-months

## 2015-12-14 ENCOUNTER — Ambulatory Visit: Payer: BC Managed Care – PPO | Admitting: Neurology

## 2016-01-06 ENCOUNTER — Other Ambulatory Visit: Payer: Self-pay | Admitting: Physician Assistant

## 2016-03-09 ENCOUNTER — Ambulatory Visit: Payer: BC Managed Care – PPO | Admitting: Neurology

## 2016-03-17 ENCOUNTER — Ambulatory Visit: Payer: BC Managed Care – PPO | Admitting: Neurology

## 2016-03-21 ENCOUNTER — Encounter: Payer: Self-pay | Admitting: Physician Assistant

## 2016-03-21 ENCOUNTER — Ambulatory Visit (INDEPENDENT_AMBULATORY_CARE_PROVIDER_SITE_OTHER): Payer: BC Managed Care – PPO | Admitting: Physician Assistant

## 2016-03-21 VITALS — BP 128/74 | HR 72 | Temp 97.9°F | Resp 16 | Ht 64.0 in | Wt 194.2 lb

## 2016-03-21 DIAGNOSIS — E559 Vitamin D deficiency, unspecified: Secondary | ICD-10-CM | POA: Diagnosis not present

## 2016-03-21 DIAGNOSIS — J45909 Unspecified asthma, uncomplicated: Secondary | ICD-10-CM

## 2016-03-21 DIAGNOSIS — D649 Anemia, unspecified: Secondary | ICD-10-CM | POA: Diagnosis not present

## 2016-03-21 DIAGNOSIS — Z0001 Encounter for general adult medical examination with abnormal findings: Secondary | ICD-10-CM | POA: Diagnosis not present

## 2016-03-21 DIAGNOSIS — Z136 Encounter for screening for cardiovascular disorders: Secondary | ICD-10-CM | POA: Diagnosis not present

## 2016-03-21 DIAGNOSIS — I10 Essential (primary) hypertension: Secondary | ICD-10-CM | POA: Diagnosis not present

## 2016-03-21 DIAGNOSIS — Z79899 Other long term (current) drug therapy: Secondary | ICD-10-CM | POA: Diagnosis not present

## 2016-03-21 DIAGNOSIS — Z1211 Encounter for screening for malignant neoplasm of colon: Secondary | ICD-10-CM

## 2016-03-21 DIAGNOSIS — E785 Hyperlipidemia, unspecified: Secondary | ICD-10-CM

## 2016-03-21 DIAGNOSIS — Z1389 Encounter for screening for other disorder: Secondary | ICD-10-CM

## 2016-03-21 DIAGNOSIS — R6889 Other general symptoms and signs: Secondary | ICD-10-CM

## 2016-03-21 DIAGNOSIS — F419 Anxiety disorder, unspecified: Secondary | ICD-10-CM | POA: Diagnosis not present

## 2016-03-21 DIAGNOSIS — Z131 Encounter for screening for diabetes mellitus: Secondary | ICD-10-CM

## 2016-03-21 DIAGNOSIS — E669 Obesity, unspecified: Secondary | ICD-10-CM

## 2016-03-21 DIAGNOSIS — N029 Recurrent and persistent hematuria with unspecified morphologic changes: Secondary | ICD-10-CM

## 2016-03-21 DIAGNOSIS — N393 Stress incontinence (female) (male): Secondary | ICD-10-CM | POA: Diagnosis not present

## 2016-03-21 DIAGNOSIS — G43809 Other migraine, not intractable, without status migrainosus: Secondary | ICD-10-CM

## 2016-03-21 LAB — HEPATIC FUNCTION PANEL
ALT: 18 U/L (ref 6–29)
AST: 12 U/L (ref 10–35)
Albumin: 4 g/dL (ref 3.6–5.1)
Alkaline Phosphatase: 79 U/L (ref 33–130)
BILIRUBIN DIRECT: 0.1 mg/dL (ref <=0.2)
BILIRUBIN INDIRECT: 0.3 mg/dL (ref 0.2–1.2)
BILIRUBIN TOTAL: 0.4 mg/dL (ref 0.2–1.2)
Total Protein: 6.5 g/dL (ref 6.1–8.1)

## 2016-03-21 LAB — FERRITIN: Ferritin: 61 ng/mL (ref 10–232)

## 2016-03-21 LAB — LIPID PANEL
CHOL/HDL RATIO: 9.1 ratio — AB (ref <=5.0)
CHOLESTEROL: 311 mg/dL — AB (ref 125–200)
HDL: 34 mg/dL — AB (ref >=46)
LDL Cholesterol: 234 mg/dL — ABNORMAL HIGH (ref <130)
Triglycerides: 213 mg/dL — ABNORMAL HIGH (ref <150)
VLDL: 43 mg/dL — ABNORMAL HIGH (ref <30)

## 2016-03-21 LAB — CBC WITH DIFFERENTIAL/PLATELET
Basophils Absolute: 77 cells/uL (ref 0–200)
Basophils Relative: 1 %
EOS PCT: 2 %
Eosinophils Absolute: 154 cells/uL (ref 15–500)
HCT: 43.3 % (ref 35.0–45.0)
Hemoglobin: 14.6 g/dL (ref 11.7–15.5)
LYMPHS PCT: 49 %
Lymphs Abs: 3773 cells/uL (ref 850–3900)
MCH: 28.7 pg (ref 27.0–33.0)
MCHC: 33.7 g/dL (ref 32.0–36.0)
MCV: 85.2 fL (ref 80.0–100.0)
MPV: 9.2 fL (ref 7.5–12.5)
Monocytes Absolute: 539 cells/uL (ref 200–950)
Monocytes Relative: 7 %
NEUTROS PCT: 41 %
Neutro Abs: 3157 cells/uL (ref 1500–7800)
Platelets: 304 10*3/uL (ref 140–400)
RBC: 5.08 MIL/uL (ref 3.80–5.10)
RDW: 13.1 % (ref 11.0–15.0)
WBC: 7.7 10*3/uL (ref 3.8–10.8)

## 2016-03-21 LAB — TSH: TSH: 0.83 m[IU]/L

## 2016-03-21 LAB — IRON AND TIBC
%SAT: 33 % (ref 11–50)
IRON: 124 ug/dL (ref 45–160)
TIBC: 374 ug/dL (ref 250–450)
UIBC: 250 ug/dL (ref 125–400)

## 2016-03-21 LAB — BASIC METABOLIC PANEL WITH GFR
BUN: 8 mg/dL (ref 7–25)
CALCIUM: 9 mg/dL (ref 8.6–10.4)
CO2: 24 mmol/L (ref 20–31)
CREATININE: 0.64 mg/dL (ref 0.50–1.05)
Chloride: 105 mmol/L (ref 98–110)
GFR, Est African American: >89 mL/min (ref >=60)
Glucose, Bld: 85 mg/dL (ref 65–99)
Potassium: 4.1 mmol/L (ref 3.5–5.3)
SODIUM: 140 mmol/L (ref 135–146)

## 2016-03-21 LAB — HEMOGLOBIN A1C
Hgb A1c MFr Bld: 5.1 % (ref <5.7)
Mean Plasma Glucose: 100 mg/dL

## 2016-03-21 LAB — MAGNESIUM: Magnesium: 1.8 mg/dL (ref 1.5–2.5)

## 2016-03-21 MED ORDER — CYCLOBENZAPRINE HCL 10 MG PO TABS: 10.0000 mg | ORAL_TABLET | Freq: Every day | ORAL | 0 refills | Status: DC

## 2016-03-21 MED ORDER — VERAPAMIL HCL ER 120 MG PO TBCR: 120.0000 mg | EXTENDED_RELEASE_TABLET | Freq: Every day | ORAL | 11 refills | Status: DC

## 2016-03-21 MED ORDER — SUMATRIPTAN SUCCINATE 100 MG PO TABS: 100.0000 mg | ORAL_TABLET | Freq: Once | ORAL | 2 refills | Status: DC | PRN

## 2016-03-21 NOTE — Progress Notes (Signed)
Complete Physical  Assessment and Plan:  Hyperlipidemia - will likely need 3 month follow up get on cholesterol medication daily -     CBC with Differential/Platelet -     BASIC METABOLIC PANEL WITH GFR -     Hepatic function panel -     Lipid panel -     EKG 12-Lead  Obesity -     TSH  Benign hematuria  Other migraine without status migrainosus, not intractable ? TMJ- will add on flexeril at night, do massage, normal neuro -     SUMAtriptan (IMITREX) 100 MG tablet; Take 1 tablet (100 mg total) by mouth once as needed for migraine. May repeat in 2 hours if headache persists or recurs. -     cyclobenzaprine (FLEXERIL) 10 MG tablet; Take 1 tablet (10 mg total) by mouth at bedtime. -     verapamil (CALAN-SR) 120 MG CR tablet; Take 1 tablet (120 mg total) by mouth at bedtime.  Asthma, unspecified asthma severity, uncomplicated  Vitamin D deficiency -     VITAMIN D 25 Hydroxy (Vit-D Deficiency, Fractures)  Medication management -     Magnesium  Female genuine stress incontinence  Anxiety  Special screening for malignant neoplasms, colon -     Ambulatory referral to Gastroenterology  Screening for diabetes mellitus -     Hemoglobin A1c  Screening for hematuria or proteinuria -     Urinalysis, Routine w reflex microscopic (not at [facility]) -     Microalbumin / creatinine urine ratio  Anemia, unspecified anemia type -     Iron and TIBC -     Ferritin  Encounter for general adult medical examination with abnormal findings -     Ambulatory referral to Gastroenterology -     CBC with Differential/Platelet -     BASIC METABOLIC PANEL WITH GFR -     Hepatic function panel -     TSH -     Lipid panel -     Hemoglobin A1c -     Magnesium -     VITAMIN D 25 Hydroxy (Vit-D Deficiency, Fractures) -     Urinalysis, Routine w reflex microscopic (not at [facility]) -     Microalbumin / creatinine urine ratio -     Iron and TIBC -     Ferritin -     EKG 12-Lead    Discussed med's  effects and SE's. Screening labs and tests as requested with regular follow-up as recommended. Over 40 minutes of exam, counseling, chart review, and complex, high level critical decision making was performed this visit.   HPI  50 y.o. 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  Her blood pressure has been controlled at home, today their BP is BP: 128/74 She does workout, she walks. She denies chest pain, shortness of breath, dizziness.   Left shoulder pain while lying down at night, some pain internal rotation.  She has headaches daily and continues to have migraines but states that they are not as often, have about 1-2 x a month.   She is on cholesterol medication but admits that she does not take it consistently, her cholesterol is not at goal. The cholesterol last visit was:   Lab Results  Component Value Date   CHOL 260 (H) 12/01/2015   HDL 40 (L) 12/01/2015  LDLCALC 180 (H) 12/01/2015   TRIG 201 (H) 12/01/2015   CHOLHDL 6.5 (H) 12/01/2015    Last A1C in the office was:  Lab Results  Component Value Date   HGBA1C 5.6 12/01/2015   Patient is on Vitamin D supplement.   Lab Results  Component Value Date   VD25OH 18 (L) 12/01/2015     BMI is Body mass index is 33.33 kg/m., she is working on diet and exercise. Wt Readings from Last 3 Encounters:  03/21/16 194 lb 3.2 oz (88.1 kg)  12/11/15 196 lb (88.9 kg)  12/01/15 192 lb 6.4 oz (87.3 kg)    Current Medications:  Current Outpatient Prescriptions on File Prior to Visit  Medication Sig Dispense Refill  . ondansetron (ZOFRAN) 4 MG tablet Take 1 tablet (4 mg total) by mouth every 8 (eight) hours as needed for nausea or vomiting. 60 tablet 0  . albuterol (PROVENTIL HFA) 108 (90 Base) MCG/ACT inhaler 2 puffs.    . ALPRAZolam (XANAX)  0.25 MG tablet TAKE ONE TABLET BY MOUTH TWICE DAILY AS NEEDED FOR ANXIETY 60 tablet 0  . aspirin-acetaminophen-caffeine (EXCEDRIN MIGRAINE) 250-250-65 MG tablet Take by mouth every 6 (six) hours as needed for headache.    . B Complex Vitamins (VITAMIN-B COMPLEX) TABS Take 1 tablet by mouth.    . Biotin 10 MG CAPS Take by mouth daily.    Marland Kitchen ibuprofen (ADVIL,MOTRIN) 800 MG tablet Take 1 tablet (800 mg total) by mouth 3 (three) times daily. 21 tablet 0  . pravastatin (PRAVACHOL) 40 MG tablet Take 40 mg by mouth.    . SUMAtriptan (IMITREX) 20 MG/ACT nasal spray Place 1 spray (20 mg total) into the nose every 2 (two) hours as needed for migraine or headache (if head persist). 2 Inhaler 4  . verapamil (CALAN-SR) 120 MG CR tablet Take 1 tablet (120 mg total) by mouth at bedtime. 30 tablet 11   No current facility-administered medications on file prior to visit.    Allergies:  Allergies  Allergen Reactions  . Shellfish Allergy Anaphylaxis  . 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   Health Maintenance:   Immunization History  Administered Date(s) Administered  . DTaP 10/24/2010  . Influenza Split 05/11/2012, 05/13/2013  . Influenza-Unspecified 05/11/2015  . PPD Test 08/27/2013, 09/23/2014  . Pneumococcal Polysaccharide-23 08/27/2013  . Tdap 10/24/2010   Tetanus: 2012 Pneumovax: 2015 Prevnar 13: due age 65 Flu vaccine: 2016 Zostavax: N/A LMP: s/p TAH Pap: 2013 MGM: 2012 DUE  DEXA: N/A Colonoscopy: DUE  Patient Care Team: Unk Pinto, MD as PCP - General (Internal Medicine) Kathie Rhodes, MD as Consulting Physician (Urology)  Surgical History:  She has a past surgical history that includes LEEP; Abdominal hysterectomy (1998); and Tonsilectomy/adenoidectomy with  myringotomy. Family History:  Herfamily history includes Alzheimer's disease in her other; Cancer in her maternal aunt and maternal uncle; Depression in her other; Diabetes in her paternal grandmother; Healthy in her daughter, sister, and son; Heart disease in her maternal aunt; Hyperlipidemia in her mother; Liver disease in her father; Migraines in her mother. Social History:  She reports that she quit smoking about 5 months ago. She does not have any smokeless tobacco history on file. She reports that she does not drink alcohol or use drugs.  Review of Systems: Review of Systems  Constitutional: Negative.   Eyes: Negative.  Respiratory: Negative.   Cardiovascular: Negative.   Gastrointestinal: Negative.   Genitourinary: Negative.   Musculoskeletal: Positive for joint pain (left shoulder).  Skin: Negative.   Neurological: Positive for headaches. Negative for dizziness, tingling, tremors, sensory change, speech change, focal weakness, seizures and loss of consciousness.  Endo/Heme/Allergies: Negative.   Psychiatric/Behavioral: Negative.     Physical Exam: Estimated body mass index is 33.33 kg/m as calculated from the following:   Height as of this encounter: 5\' 4"  (1.626 m).   Weight as of this encounter: 194 lb 3.2 oz (88.1 kg). BP 128/74   Pulse 72   Temp 97.9 F (36.6 C)   Resp 16   Ht 5\' 4"  (1.626 m)   Wt 194 lb 3.2 oz (88.1 kg)   SpO2 99%   BMI 33.33 kg/m  General Appearance: Well nourished, in no apparent distress.  Eyes: PERRLA, EOMs, conjunctiva no swelling or erythema, normal fundi and vessels.  Sinuses: No Frontal/maxillary tenderness  ENT/Mouth: Ext aud canals clear, normal light reflex with TMs without erythema, bulging. Good dentition. No erythema, swelling, or exudate on post pharynx. Tonsils not swollen or erythematous. Hearing normal.  Neck: Supple, thyroid normal. No bruits  Respiratory: Respiratory effort normal, BS equal bilaterally without rales, rhonchi,  wheezing or stridor.  Cardio: RRR without murmurs, rubs or gallops. Brisk peripheral pulses without edema.  Chest: symmetric, with normal excursions and percussion.  Breasts: Symmetric, without lumps, nipple discharge, retractions.  Abdomen: Soft, nontender, no guarding, rebound, hernias, masses, or organomegaly.  Lymphatics: Non tender without lymphadenopathy.  Genitourinary: defer Musculoskeletal: Full ROM all peripheral extremities,5/5 strength, and normal gait.  Skin: Warm, dry without rashes, lesions, ecchymosis. Neuro: Cranial nerves intact, reflexes equal bilaterally. Normal muscle tone, no cerebellar symptoms. Sensation intact.  Psych: Awake and oriented X 3, normal affect, Insight and Judgment appropriate.   EKG: defer AORTA SCAN: WNL   Vicie Mutters 9:41 AM [facility]

## 2016-03-21 NOTE — Patient Instructions (Addendum)
[facility]  7 a.m.-6:30 p.m., Monday 7 a.m.-5 p.m., Tuesday-Friday Schedule an appointment by calling [phone].  [facility] Schedule an appointment by calling [phone].   Colon cancer is 3rd most diagnosed cancer and 2nd leading cause of death in both men and women 51 years of age and older despite being one of the most preventable and treatable cancers if found early.  4 of out 5 people diagnosed with colon cancer have NO prior family history.  When caught EARLY 90% of colon cancer is curable.    Impingement Syndrome, Rotator Cuff, Bursitis With Rehab Impingement syndrome is a condition that involves inflammation of the tendons of the rotator cuff and the subacromial bursa, that causes pain in the shoulder. The rotator cuff consists of four tendons and muscles that control much of the shoulder and upper arm function. The subacromial bursa is a fluid filled sac that helps reduce friction between the rotator cuff and one of the bones of the shoulder (acromion). Impingement syndrome is usually an overuse injury that causes swelling of the bursa (bursitis), swelling of the tendon (tendonitis), and/or a tear of the tendon (strain). Strains are classified into three categories. Grade 1 strains cause pain, but the tendon is not lengthened. Grade 2 strains include a lengthened ligament, due to the ligament being stretched or partially ruptured. With grade 2 strains there is still function, although the function may be decreased. Grade 3 strains include a complete tear of the tendon or muscle, and function is usually impaired. SYMPTOMS   Pain around the shoulder, often at the outer portion of the upper arm.  Pain that gets worse with shoulder function, especially when reaching overhead or lifting.  Sometimes, aching when not using the arm.  Pain that wakes you up at night.  Sometimes, tenderness, swelling, warmth, or redness over the affected  area.  Loss of strength.  Limited motion of the shoulder, especially reaching behind the back (to the back pocket or to unhook bra) or across your body.  Crackling sound (crepitation) when moving the arm.  Biceps tendon pain and inflammation (in the front of the shoulder). Worse when bending the elbow or lifting. CAUSES  Impingement syndrome is often an overuse injury, in which chronic (repetitive) motions cause the tendons or bursa to become inflamed. A strain occurs when a force is paced on the tendon or muscle that is greater than it can withstand. Common mechanisms of injury include: Stress from sudden increase in duration, frequency, or intensity of training.  Direct hit (trauma) to the shoulder.  Aging, erosion of the tendon with normal use.  Bony bump on shoulder (acromial spur). RISK INCREASES WITH:  Contact sports (football, wrestling, boxing).  Throwing sports (baseball, tennis, volleyball).  Weightlifting and bodybuilding.  Heavy labor.  Previous injury to the rotator cuff, including impingement.  Poor shoulder strength and flexibility.  Failure to warm up properly before activity.  Inadequate protective equipment.  Old age.  Bony bump on shoulder (acromial spur). PREVENTION   Warm up and stretch properly before activity.  Allow for adequate recovery between workouts.  Maintain physical fitness:  Strength, flexibility, and endurance.  Cardiovascular fitness.  Learn and use proper exercise technique. PROGNOSIS  If treated properly, impingement syndrome usually goes away within 6 weeks. Sometimes surgery is required.  RELATED COMPLICATIONS   Longer healing time if not properly treated, or if not given enough time to heal.  Recurring symptoms, that result in a chronic condition.  Shoulder  stiffness, frozen shoulder, or loss of motion.  Rotator cuff tendon tear.  Recurring symptoms, especially if activity is resumed too soon, with overuse, with a  direct blow, or when using poor technique. TREATMENT  Treatment first involves the use of ice and medicine, to reduce pain and inflammation. The use of strengthening and stretching exercises may help reduce pain with activity. These exercises may be performed at home or with a therapist. If non-surgical treatment is unsuccessful after more than 6 months, surgery may be advised. After surgery and rehabilitation, activity is usually possible in 3 months.  MEDICATION  If pain medicine is needed, nonsteroidal anti-inflammatory medicines (aspirin and ibuprofen), or other minor pain relievers (acetaminophen), are often advised.  Do not take pain medicine for 7 days before surgery.  Prescription pain relievers may be given, if your caregiver thinks they are needed. Use only as directed and only as much as you need.  Corticosteroid injections may be given by your caregiver. These injections should be reserved for the most serious cases, because they may only be given a certain number of times. HEAT AND COLD  Cold treatment (icing) should be applied for 10 to 15 minutes every 2 to 3 hours for inflammation and pain, and immediately after activity that aggravates your symptoms. Use ice packs or an ice massage.  Heat treatment may be used before performing stretching and strengthening activities prescribed by your caregiver, physical therapist, or athletic trainer. Use a heat pack or a warm water soak. SEEK MEDICAL CARE IF:   Symptoms get worse or do not improve in 4 to 6 weeks, despite treatment.  New, unexplained symptoms develop. (Drugs used in treatment may produce side effects.) EXERCISES  RANGE OF MOTION (ROM) AND STRETCHING EXERCISES - Impingement Syndrome (Rotator Cuff  Tendinitis, Bursitis) These exercises may help you when beginning to rehabilitate your injury. Your symptoms may go away with or without further involvement from your physician, physical therapist or athletic trainer. While  completing these exercises, remember:   Restoring tissue flexibility helps normal motion to return to the joints. This allows healthier, less painful movement and activity.  An effective stretch should be held for at least 30 seconds.  A stretch should never be painful. You should only feel a gentle lengthening or release in the stretched tissue. STRETCH - Flexion, Standing  Stand with good posture. With an underhand grip on your right / left hand, and an overhand grip on the opposite hand, grasp a broomstick or cane so that your hands are a little more than shoulder width apart.  Keeping your right / left elbow straight and shoulder muscles relaxed, push the stick with your opposite hand, to raise your right / left arm in front of your body and then overhead. Raise your arm until you feel a stretch in your right / left shoulder, but before you have increased shoulder pain.  Try to avoid shrugging your right / left shoulder as your arm rises, by keeping your shoulder blade tucked down and toward your mid-back spine. Hold for __________ seconds.  Slowly return to the starting position. Repeat __________ times. Complete this exercise __________ times per day. STRETCH - Abduction, Supine  Lie on your back. With an underhand grip on your right / left hand and an overhand grip on the opposite hand, grasp a broomstick or cane so that your hands are a little more than shoulder width apart.  Keeping your right / left elbow straight and your shoulder muscles relaxed, push the  stick with your opposite hand, to raise your right / left arm out to the side of your body and then overhead. Raise your arm until you feel a stretch in your right / left shoulder, but before you have increased shoulder pain.  Try to avoid shrugging your right / left shoulder as your arm rises, by keeping your shoulder blade tucked down and toward your mid-back spine. Hold for __________ seconds.  Slowly return to the starting  position. Repeat __________ times. Complete this exercise __________ times per day. ROM - Flexion, Active-Assisted  Lie on your back. You may bend your knees for comfort.  Grasp a broomstick or cane so your hands are about shoulder width apart. Your right / left hand should grip the end of the stick, so that your hand is positioned "thumbs-up," as if you were about to shake hands.  Using your healthy arm to lead, raise your right / left arm overhead, until you feel a gentle stretch in your shoulder. Hold for __________ seconds.  Use the stick to assist in returning your right / left arm to its starting position. Repeat __________ times. Complete this exercise __________ times per day.  ROM - Internal Rotation, Supine   Lie on your back on a firm surface. Place your right / left elbow about 60 degrees away from your side. Elevate your elbow with a folded towel, so that the elbow and shoulder are the same height.  Using a broomstick or cane and your strong arm, pull your right / left hand toward your body until you feel a gentle stretch, but no increase in your shoulder pain. Keep your shoulder and elbow in place throughout the exercise.  Hold for __________ seconds. Slowly return to the starting position. Repeat __________ times. Complete this exercise __________ times per day. STRETCH - Internal Rotation  Place your right / left hand behind your back, palm up.  Throw a towel or belt over your opposite shoulder. Grasp the towel with your right / left hand.  While keeping an upright posture, gently pull up on the towel, until you feel a stretch in the front of your right / left shoulder.  Avoid shrugging your right / left shoulder as your arm rises, by keeping your shoulder blade tucked down and toward your mid-back spine.  Hold for __________ seconds. Release the stretch, by lowering your healthy hand. Repeat __________ times. Complete this exercise __________ times per day. ROM -  Internal Rotation   Using an underhand grip, grasp a stick behind your back with both hands.  While standing upright with good posture, slide the stick up your back until you feel a mild stretch in the front of your shoulder.  Hold for __________ seconds. Slowly return to your starting position. Repeat __________ times. Complete this exercise __________ times per day.  STRETCH - Posterior Shoulder Capsule   Stand or sit with good posture. Grasp your right / left elbow and draw it across your chest, keeping it at the same height as your shoulder.  Pull your elbow, so your upper arm comes in closer to your chest. Pull until you feel a gentle stretch in the back of your shoulder.  Hold for __________ seconds. Repeat __________ times. Complete this exercise __________ times per day. STRENGTHENING EXERCISES - Impingement Syndrome (Rotator Cuff Tendinitis, Bursitis) These exercises may help you when beginning to rehabilitate your injury. They may resolve your symptoms with or without further involvement from your physician, physical therapist or athletic trainer.  While completing these exercises, remember:  Muscles can gain both the endurance and the strength needed for everyday activities through controlled exercises.  Complete these exercises as instructed by your physician, physical therapist or athletic trainer. Increase the resistance and repetitions only as guided.  You may experience muscle soreness or fatigue, but the pain or discomfort you are trying to eliminate should never worsen during these exercises. If this pain does get worse, stop and make sure you are following the directions exactly. If the pain is still present after adjustments, discontinue the exercise until you can discuss the trouble with your clinician.  During your recovery, avoid activity or exercises which involve actions that place your injured hand or elbow above your head or behind your back or head. These positions  stress the tissues which you are trying to heal. STRENGTH - Scapular Depression and Adduction   With good posture, sit on a firm chair. Support your arms in front of you, with pillows, arm rests, or on a table top. Have your elbows in line with the sides of your body.  Gently draw your shoulder blades down and toward your mid-back spine. Gradually increase the tension, without tensing the muscles along the top of your shoulders and the back of your neck.  Hold for __________ seconds. Slowly release the tension and relax your muscles completely before starting the next repetition.  After you have practiced this exercise, remove the arm support and complete the exercise in standing as well as sitting position. Repeat __________ times. Complete this exercise __________ times per day.  STRENGTH - Shoulder Abductors, Isometric  With good posture, stand or sit about 4-6 inches from a wall, with your right / left side facing the wall.  Bend your right / left elbow. Gently press your right / left elbow into the wall. Increase the pressure gradually, until you are pressing as hard as you can, without shrugging your shoulder or increasing any shoulder discomfort.  Hold for __________ seconds.  Release the tension slowly. Relax your shoulder muscles completely before you begin the next repetition. Repeat __________ times. Complete this exercise __________ times per day.  STRENGTH - External Rotators, Isometric  Keep your right / left elbow at your side and bend it 90 degrees.  Step into a door frame so that the outside of your right / left wrist can press against the door frame without your upper arm leaving your side.  Gently press your right / left wrist into the door frame, as if you were trying to swing the back of your hand away from your stomach. Gradually increase the tension, until you are pressing as hard as you can, without shrugging your shoulder or increasing any shoulder  discomfort.  Hold for __________ seconds.  Release the tension slowly. Relax your shoulder muscles completely before you begin the next repetition. Repeat __________ times. Complete this exercise __________ times per day.  STRENGTH - Supraspinatus   Stand or sit with good posture. Grasp a __________ weight, or an exercise band or tubing, so that your hand is "thumbs-up," like you are shaking hands.  Slowly lift your right / left arm in a "V" away from your thigh, diagonally into the space between your side and straight ahead. Lift your hand to shoulder height or as far as you can, without increasing any shoulder pain. At first, many people do not lift their hands above shoulder height.  Avoid shrugging your right / left shoulder as your arm rises, by keeping  your shoulder blade tucked down and toward your mid-back spine.  Hold for __________ seconds. Control the descent of your hand, as you slowly return to your starting position. Repeat __________ times. Complete this exercise __________ times per day.  STRENGTH - External Rotators  Secure a rubber exercise band or tubing to a fixed object (table, pole) so that it is at the same height as your right / left elbow when you are standing or sitting on a firm surface.  Stand or sit so that the secured exercise band is at your uninjured side.  Bend your right / left elbow 90 degrees. Place a folded towel or small pillow under your right / left arm, so that your elbow is a few inches away from your side.  Keeping the tension on the exercise band, pull it away from your body, as if pivoting on your elbow. Be sure to keep your body steady, so that the movement is coming only from your rotating shoulder.  Hold for __________ seconds. Release the tension in a controlled manner, as you return to the starting position. Repeat __________ times. Complete this exercise __________ times per day.  STRENGTH - Internal Rotators   Secure a rubber exercise  band or tubing to a fixed object (table, pole) so that it is at the same height as your right / left elbow when you are standing or sitting on a firm surface.  Stand or sit so that the secured exercise band is at your right / left side.  Bend your elbow 90 degrees. Place a folded towel or small pillow under your right / left arm so that your elbow is a few inches away from your side.  Keeping the tension on the exercise band, pull it across your body, toward your stomach. Be sure to keep your body steady, so that the movement is coming only from your rotating shoulder.  Hold for __________ seconds. Release the tension in a controlled manner, as you return to the starting position. Repeat __________ times. Complete this exercise __________ times per day.  STRENGTH - Scapular Protractors, Standing   Stand arms length away from a wall. Place your hands on the wall, keeping your elbows straight.  Begin by dropping your shoulder blades down and toward your mid-back spine.  To strengthen your protractors, keep your shoulder blades down, but slide them forward on your rib cage. It will feel as if you are lifting the back of your rib cage away from the wall. This is a subtle motion and can be challenging to complete. Ask your caregiver for further instruction, if you are not sure you are doing the exercise correctly.  Hold for __________ seconds. Slowly return to the starting position, resting the muscles completely before starting the next repetition. Repeat __________ times. Complete this exercise __________ times per day. STRENGTH - Scapular Protractors, Supine  Lie on your back on a firm surface. Extend your right / left arm straight into the air while holding a __________ weight in your hand.  Keeping your head and back in place, lift your shoulder off the floor.  Hold for __________ seconds. Slowly return to the starting position, and allow your muscles to relax completely before starting the  next repetition. Repeat __________ times. Complete this exercise __________ times per day. STRENGTH - Scapular Protractors, Quadruped  Get onto your hands and knees, with your shoulders directly over your hands (or as close as you can be, comfortably).  Keeping your elbows locked, lift the  back of your rib cage up into your shoulder blades, so your mid-back rounds out. Keep your neck muscles relaxed.  Hold this position for __________ seconds. Slowly return to the starting position and allow your muscles to relax completely before starting the next repetition. Repeat __________ times. Complete this exercise __________ times per day.  STRENGTH - Scapular Retractors  Secure a rubber exercise band or tubing to a fixed object (table, pole), so that it is at the height of your shoulders when you are either standing, or sitting on a firm armless chair.  With a palm down grip, grasp an end of the band in each hand. Straighten your elbows and lift your hands straight in front of you, at shoulder height. Step back, away from the secured end of the band, until it becomes tense.  Squeezing your shoulder blades together, draw your elbows back toward your sides, as you bend them. Keep your upper arms lifted away from your body throughout the exercise.  Hold for __________ seconds. Slowly ease the tension on the band, as you reverse the directions and return to the starting position. Repeat __________ times. Complete this exercise __________ times per day. STRENGTH - Shoulder Extensors   Secure a rubber exercise band or tubing to a fixed object (table, pole) so that it is at the height of your shoulders when you are either standing, or sitting on a firm armless chair.  With a thumbs-up grip, grasp an end of the band in each hand. Straighten your elbows and lift your hands straight in front of you, at shoulder height. Step back, away from the secured end of the band, until it becomes tense.  Squeezing  your shoulder blades together, pull your hands down to the sides of your thighs. Do not allow your hands to go behind you.  Hold for __________ seconds. Slowly ease the tension on the band, as you reverse the directions and return to the starting position. Repeat __________ times. Complete this exercise __________ times per day.  STRENGTH - Scapular Retractors and External Rotators   Secure a rubber exercise band or tubing to a fixed object (table, pole) so that it is at the height as your shoulders, when you are either standing, or sitting on a firm armless chair.  With a palm down grip, grasp an end of the band in each hand. Bend your elbows 90 degrees and lift your elbows to shoulder height, at your sides. Step back, away from the secured end of the band, until it becomes tense.  Squeezing your shoulder blades together, rotate your shoulders so that your upper arms and elbows remain stationary, but your fists travel upward to head height.  Hold for __________ seconds. Slowly ease the tension on the band, as you reverse the directions and return to the starting position. Repeat __________ times. Complete this exercise __________ times per day.  STRENGTH - Scapular Retractors and External Rotators, Rowing   Secure a rubber exercise band or tubing to a fixed object (table, pole) so that it is at the height of your shoulders, when you are either standing, or sitting on a firm armless chair.  With a palm down grip, grasp an end of the band in each hand. Straighten your elbows and lift your hands straight in front of you, at shoulder height. Step back, away from the secured end of the band, until it becomes tense.  Step 1: Squeeze your shoulder blades together. Bending your elbows, draw your hands to your chest,  as if you are rowing a boat. At the end of this motion, your hands and elbow should be at shoulder height and your elbows should be out to your sides.  Step 2: Rotate your shoulders, to  raise your hands above your head. Your forearms should be vertical and your upper arms should be horizontal.  Hold for __________ seconds. Slowly ease the tension on the band, as you reverse the directions and return to the starting position. Repeat __________ times. Complete this exercise __________ times per day.  STRENGTH - Scapular Depressors  Find a sturdy chair without wheels, such as a dining room chair.  Keeping your feet on the floor, and your hands on the chair arms, lift your bottom up from the seat, and lock your elbows.  Keeping your elbows straight, allow gravity to pull your body weight down. Your shoulders will rise toward your ears.  Raise your body against gravity by drawing your shoulder blades down your back, shortening the distance between your shoulders and ears. Although your feet should always maintain contact with the floor, your feet should progressively support less body weight, as you get stronger.  Hold for __________ seconds. In a controlled and slow manner, lower your body weight to begin the next repetition. Repeat __________ times. Complete this exercise __________ times per day.    This information is not intended to replace advice given to you by your health care provider. Make sure you discuss any questions you have with your health care provider.   Document Released: 07/11/2005 Document Revised: 08/01/2014 Document Reviewed: 10/23/2008 Elsevier Interactive Patient Education [employer].  What is the TMJ? The temporomandibular ([id number]-ler) joint, or the TMJ, connects the upper and lower jawbones. This joint allows the jaw to open wide and move back and forth when you chew, talk, or yawn.There are also several muscles that help this joint move. There can be muscle tightness and pain in the muscle that can cause several symptoms.  What causes TMJ pain? There are many causes of TMJ pain. Repeated chewing (for example, chewing gum) and  clenching your teeth can cause pain in the joint. Some TMJ pain has no obvious cause. What can I do to ease the pain? There are many things you can do to help your pain get better. When you have pain:  Eat soft foods and stay away from chewy foods (for example, taffy) Try to use both sides of your mouth to chew Don't chew gum Massage Don't open your mouth wide (for example, during yawning or singing) Don't bite your cheeks or fingernails Lower your amount of stress and worry Applying a warm, damp washcloth to the joint may help. Over-the-counter pain medicines such as ibuprofen (one brand: Advil) or acetaminophen (one brand: Tylenol) might also help. Do not use these medicines if you are allergic to them or if your doctor told you not to use them. How can I stop the pain from coming back? When your pain is better, you can do these exercises to make your muscles stronger and to keep the pain from coming back:  Resisted mouth opening: Place your thumb or two fingers under your chin and open your mouth slowly, pushing up lightly on your chin with your thumb. Hold for three to six seconds. Close your mouth slowly. Resisted mouth closing: Place your thumbs under your chin and your two index fingers on the ridge between your mouth and the bottom of your chin. Push down lightly on your chin as  you close your mouth. Tongue up: Slowly open and close your mouth while keeping the tongue touching the roof of the mouth. Side-to-side jaw movement: Place an object about one fourth of an inch thick (for example, two tongue depressors) between your front teeth. Slowly move your jaw from side to side. Increase the thickness of the object as the exercise becomes easier Forward jaw movement: Place an object about one fourth of an inch thick between your front teeth and move the bottom jaw forward so that the bottom teeth are in front of the top teeth. Increase the thickness of the object as the exercise becomes  easier. These exercises should not be painful. If it hurts to do these exercises, stop doing them and talk to your family doctor.

## 2016-03-22 LAB — URINALYSIS, ROUTINE W REFLEX MICROSCOPIC
Bilirubin Urine: NEGATIVE
Glucose, UA: NEGATIVE
Ketones, ur: NEGATIVE
LEUKOCYTES UA: NEGATIVE
NITRITE: NEGATIVE
Protein, ur: NEGATIVE
SPECIFIC GRAVITY, URINE: 1.021 (ref 1.001–1.035)
pH: 6.5 (ref 5.0–8.0)

## 2016-03-22 LAB — VITAMIN D 25 HYDROXY (VIT D DEFICIENCY, FRACTURES): Vit D, 25-Hydroxy: 20 ng/mL — ABNORMAL LOW (ref 30–100)

## 2016-03-22 LAB — URINALYSIS, MICROSCOPIC ONLY
Bacteria, UA: NONE SEEN [HPF]
CASTS: NONE SEEN [LPF]
Crystals: NONE SEEN [HPF]
YEAST: NONE SEEN [HPF]

## 2016-03-22 LAB — MICROALBUMIN / CREATININE URINE RATIO
CREATININE, URINE: 172 mg/dL (ref 20–320)
MICROALB UR: 1.2 mg/dL
MICROALB/CREAT RATIO: 7 ug/mg{creat} (ref <30)

## 2016-03-23 ENCOUNTER — Encounter: Payer: Self-pay | Admitting: Internal Medicine

## 2016-04-29 ENCOUNTER — Ambulatory Visit (AMBULATORY_SURGERY_CENTER): Payer: Self-pay

## 2016-04-29 VITALS — Ht 63.0 in | Wt 194.0 lb

## 2016-04-29 DIAGNOSIS — Z1211 Encounter for screening for malignant neoplasm of colon: Secondary | ICD-10-CM

## 2016-04-29 MED ORDER — SUPREP BOWEL PREP KIT 17.5-3.13-1.6 GM/177ML PO SOLN: 1.0000 | Freq: Once | ORAL | 0 refills | Status: AC

## 2016-04-29 NOTE — Progress Notes (Signed)
No allergies to eggs or soy No past problems with anesthesia No diet meds No home oxygen  Registered for emmi 

## 2016-05-03 ENCOUNTER — Encounter: Payer: Self-pay | Admitting: Internal Medicine

## 2016-05-13 ENCOUNTER — Ambulatory Visit (AMBULATORY_SURGERY_CENTER): Payer: BC Managed Care – PPO | Admitting: Internal Medicine

## 2016-05-13 ENCOUNTER — Encounter: Payer: Self-pay | Admitting: Internal Medicine

## 2016-05-13 VITALS — BP 148/82 | HR 68 | Temp 96.6°F | Resp 16 | Ht 63.0 in | Wt 194.0 lb

## 2016-05-13 DIAGNOSIS — Z1212 Encounter for screening for malignant neoplasm of rectum: Secondary | ICD-10-CM

## 2016-05-13 DIAGNOSIS — Z1211 Encounter for screening for malignant neoplasm of colon: Secondary | ICD-10-CM | POA: Diagnosis present

## 2016-05-13 DIAGNOSIS — D122 Benign neoplasm of ascending colon: Secondary | ICD-10-CM | POA: Diagnosis not present

## 2016-05-13 MED ORDER — SODIUM CHLORIDE 0.9 % IV SOLN: 500.0000 mL | INTRAVENOUS | Status: DC

## 2016-05-13 NOTE — Patient Instructions (Signed)
Impression/Recommendation:  Polyp handout given.  Avoid Aspirin and NSAIDS   (motrin, Ibuprofen, naproxen, Advil)  For 2 weeks.  Repeat colonoscopy in 3 months for surveillance pending pathology results.  YOU HAD AN ENDOSCOPIC PROCEDURE TODAY AT [facility]:   Refer to the procedure report that was given to you for any specific questions about what was found during the examination.  If the procedure report does not answer your questions, please call your gastroenterologist to clarify.  If you requested that your care partner not be given the details of your procedure findings, then the procedure report has been included in a sealed envelope for you to review at your convenience later.  YOU SHOULD EXPECT: Some feelings of bloating in the abdomen. Passage of more gas than usual.  Walking can help get rid of the air that was put into your GI tract during the procedure and reduce the bloating. If you had a lower endoscopy (such as a colonoscopy or flexible sigmoidoscopy) you may notice spotting of blood in your stool or on the toilet paper. If you underwent a bowel prep for your procedure, you may not have a normal bowel movement for a few days.  Please Note:  You might notice some irritation and congestion in your nose or some drainage.  This is from the oxygen used during your procedure.  There is no need for concern and it should clear up in a day or so.  SYMPTOMS TO REPORT IMMEDIATELY:   Following lower endoscopy (colonoscopy or flexible sigmoidoscopy):  Excessive amounts of blood in the stool  Significant tenderness or worsening of abdominal pains  Swelling of the abdomen that is new, acute  Fever of 100F or higher   For urgent or emergent issues, a gastroenterologist can be reached at any hour by calling [phone].   DIET:  We do recommend a small meal at first, but then you may proceed to your regular diet.  Drink plenty of fluids but you should avoid alcoholic  beverages for 24 hours.  ACTIVITY:  You should plan to take it easy for the rest of today and you should NOT DRIVE or use heavy machinery until tomorrow (because of the sedation medicines used during the test).    FOLLOW UP: Our staff will call the number listed on your records the next business day following your procedure to check on you and address any questions or concerns that you may have regarding the information given to you following your procedure. If we do not reach you, we will leave a message.  However, if you are feeling well and you are not experiencing any problems, there is no need to return our call.  We will assume that you have returned to your regular daily activities without incident.  If any biopsies were taken you will be contacted by phone or by letter within the next 1-3 weeks.  Please call us at [phone] if you have not heard about the biopsies in 3 weeks.    SIGNATURES/CONFIDENTIALITY: You and/or your care partner have signed paperwork which will be entered into your electronic medical record.  These signatures attest to the fact that that the information above on your After Visit Summary has been reviewed and is understood.  Full responsibility of the confidentiality of this discharge information lies with you and/or your care-partner.

## 2016-05-13 NOTE — Progress Notes (Signed)
To recovery, report to RN, VSS. 

## 2016-05-13 NOTE — Progress Notes (Signed)
Called to room to assist during endoscopic procedure.  Patient ID and intended procedure confirmed with present staff. Received instructions for my participation in the procedure from the performing physician.  

## 2016-05-13 NOTE — Op Note (Addendum)
[facility] Patient Name: Kimberly Zhang Procedure Date: 05/13/2016 9:13 AM MRN: [id number] Endoscopist: Docia Chuck. Henrene Pastor , MD Age: 50 Referring MD:  Date of Birth: Jun 11, 1966 Gender: Female Account #: [id number] Procedure:                Colonoscopy, with submucosal injection, hot snare                            polypectomy, ink tattoo placmt Indications:              Screening for colorectal malignant neoplasm Medicines:                Monitored Anesthesia Care Procedure:                Pre-Anesthesia Assessment:                           - Prior to the procedure, a History and Physical                            was performed, and patient medications and                            allergies were reviewed. The patient's tolerance of                            previous anesthesia was also reviewed. The risks                            and benefits of the procedure and the sedation                            options and risks were discussed with the patient.                            All questions were answered, and informed consent                            was obtained. Prior Anticoagulants: The patient has                            taken no previous anticoagulant or antiplatelet                            agents. ASA Grade Assessment: II - A patient with                            mild systemic disease. After reviewing the risks                            and benefits, the patient was deemed in                            satisfactory condition to undergo the procedure.  After obtaining informed consent, the colonoscope                            was passed under direct vision. Throughout the                            procedure, the patient's blood pressure, pulse, and                            oxygen saturations were monitored continuously. The                            Model CF-HQ190L [id number]) scope was introduced        through the anus and advanced to the the cecum,                            identified by appendiceal orifice and ileocecal                            valve. The ileocecal valve, appendiceal orifice,                            and rectum were photographed. The quality of the                            bowel preparation was excellent. The colonoscopy                            was performed without difficulty. The patient                            tolerated the procedure well. The bowel preparation                            used was SUPREP. Scope In: 9:18:03 AM Scope Out: 9:47:53 AM Scope Withdrawal Time: 0 hours 27 minutes 4 seconds  Total Procedure Duration: 0 hours 29 minutes 50 seconds  Findings:                 A 35 mm polyp was found in the ascending colon. The                            polyp was multi-lobulated. The polyp was removed                            with a saline injection-lift technique using a hot                            snare. Resection, piecemeal, and retrieval were                            complete. Polypoid tissue was retrieved with Jabier Mutton  net. Subsequently, ink tattoo was placed just                            distal to the resected lesion (see photograph).                           The exam was otherwise without abnormality on                            direct and retroflexion views. Complications:            No immediate complications. Estimated blood loss:                            None. Estimated Blood Loss:     Estimated blood loss: none. Impression:               - One large 35 mm polyp in the ascending colon,                            removed using injection-lift and a hot snare.                            Resected and retrieved.                           - The examination was otherwise normal on direct                            and retroflexion views. Recommendation:           - Repeat colonoscopy in 3 months for  surveillance                            if no invasive cancer in resection specimen.                           - Patient has a contact number available for                            emergencies. The signs and symptoms of potential                            delayed complications were discussed with the                            patient. Return to normal activities tomorrow.                            Written discharge instructions were provided to the                            patient.                           - Resume previous diet.                           -  Continue present medications, but avoid aspirin                            and NSAIDs for 2 weeks.                           - Await pathology results. Docia Chuck. Henrene Pastor, MD 05/13/2016 10:00:00 AM This report has been signed electronically.

## 2016-05-16 ENCOUNTER — Telehealth: Payer: Self-pay

## 2016-05-16 ENCOUNTER — Encounter: Payer: Self-pay | Admitting: Internal Medicine

## 2016-05-16 NOTE — Telephone Encounter (Signed)
  Follow up Call-  Call back number 05/13/2016  Post procedure Call Back phone  # [phone]  Permission to leave phone message Yes  Some recent data might be hidden     Patient questions:  Do you have a fever, pain , or abdominal swelling? No. Pain Score  0 *  Have you tolerated food without any problems? Yes.    Have you been able to return to your normal activities? Yes.    Do you have any questions about your discharge instructions: Diet   No. Medications  No. Follow up visit  No.  Do you have questions or concerns about your Care? No.  Actions: * If pain score is 4 or above: No action needed, pain <4.

## 2016-05-27 ENCOUNTER — Other Ambulatory Visit: Payer: Self-pay | Admitting: Physician Assistant

## 2016-06-01 ENCOUNTER — Encounter: Payer: Self-pay | Admitting: Physician Assistant

## 2016-06-03 ENCOUNTER — Other Ambulatory Visit: Payer: Self-pay | Admitting: Physician Assistant

## 2016-06-23 ENCOUNTER — Encounter: Payer: Self-pay | Admitting: Physician Assistant

## 2016-06-23 ENCOUNTER — Ambulatory Visit (INDEPENDENT_AMBULATORY_CARE_PROVIDER_SITE_OTHER): Payer: BC Managed Care – PPO | Admitting: Physician Assistant

## 2016-06-23 VITALS — BP 118/60 | HR 87 | Temp 97.7°F | Resp 14 | Ht 64.0 in | Wt 192.0 lb

## 2016-06-23 DIAGNOSIS — R202 Paresthesia of skin: Secondary | ICD-10-CM | POA: Diagnosis not present

## 2016-06-23 DIAGNOSIS — R7309 Other abnormal glucose: Secondary | ICD-10-CM | POA: Diagnosis not present

## 2016-06-23 DIAGNOSIS — E559 Vitamin D deficiency, unspecified: Secondary | ICD-10-CM

## 2016-06-23 DIAGNOSIS — G43809 Other migraine, not intractable, without status migrainosus: Secondary | ICD-10-CM | POA: Diagnosis not present

## 2016-06-23 DIAGNOSIS — E785 Hyperlipidemia, unspecified: Secondary | ICD-10-CM

## 2016-06-23 DIAGNOSIS — R5383 Other fatigue: Secondary | ICD-10-CM | POA: Diagnosis not present

## 2016-06-23 DIAGNOSIS — Z79899 Other long term (current) drug therapy: Secondary | ICD-10-CM | POA: Diagnosis not present

## 2016-06-23 LAB — CBC WITH DIFFERENTIAL/PLATELET
BASOS PCT: 0 %
Basophils Absolute: 0 cells/uL (ref 0–200)
EOS ABS: 172 {cells}/uL (ref 15–500)
Eosinophils Relative: 2 %
HEMATOCRIT: 42.3 % (ref 35.0–45.0)
HEMOGLOBIN: 14.2 g/dL (ref 11.7–15.5)
LYMPHS ABS: 3956 {cells}/uL — AB (ref 850–3900)
Lymphocytes Relative: 46 %
MCH: 28.7 pg (ref 27.0–33.0)
MCHC: 33.6 g/dL (ref 32.0–36.0)
MCV: 85.6 fL (ref 80.0–100.0)
MONO ABS: 602 {cells}/uL (ref 200–950)
MPV: 8.9 fL (ref 7.5–12.5)
Monocytes Relative: 7 %
NEUTROS ABS: 3870 {cells}/uL (ref 1500–7800)
Neutrophils Relative %: 45 %
PLATELETS: 314 10*3/uL (ref 140–400)
RBC: 4.94 MIL/uL (ref 3.80–5.10)
RDW: 13.4 % (ref 11.0–15.0)
WBC: 8.6 10*3/uL (ref 3.8–10.8)

## 2016-06-23 LAB — HEPATIC FUNCTION PANEL
ALK PHOS: 85 U/L (ref 33–130)
ALT: 12 U/L (ref 6–29)
AST: 13 U/L (ref 10–35)
Albumin: 3.9 g/dL (ref 3.6–5.1)
BILIRUBIN TOTAL: 0.4 mg/dL (ref 0.2–1.2)
Bilirubin, Direct: 0.1 mg/dL (ref <=0.2)
Indirect Bilirubin: 0.3 mg/dL (ref 0.2–1.2)
Total Protein: 6.3 g/dL (ref 6.1–8.1)

## 2016-06-23 LAB — LIPID PANEL
Cholesterol: 219 mg/dL — ABNORMAL HIGH (ref <200)
HDL: 29 mg/dL — ABNORMAL LOW (ref >50)
LDL CALC: 147 mg/dL — AB (ref <100)
TRIGLYCERIDES: 217 mg/dL — AB (ref <150)
Total CHOL/HDL Ratio: 7.6 Ratio — ABNORMAL HIGH (ref <5.0)
VLDL: 43 mg/dL — AB (ref <30)

## 2016-06-23 LAB — BASIC METABOLIC PANEL WITH GFR
BUN: 7 mg/dL (ref 7–25)
CHLORIDE: 106 mmol/L (ref 98–110)
CO2: 23 mmol/L (ref 20–31)
CREATININE: 0.67 mg/dL (ref 0.50–1.05)
Calcium: 9.5 mg/dL (ref 8.6–10.4)
GFR, Est African American: >89 mL/min (ref >=60)
GFR, Est Non African American: >89 mL/min (ref >=60)
Glucose, Bld: 87 mg/dL (ref 65–99)
POTASSIUM: 3.7 mmol/L (ref 3.5–5.3)
Sodium: 139 mmol/L (ref 135–146)

## 2016-06-23 LAB — IRON AND TIBC
%SAT: 24 % (ref 11–50)
Iron: 82 ug/dL (ref 45–160)
TIBC: 343 ug/dL (ref 250–450)
UIBC: 261 ug/dL (ref 125–400)

## 2016-06-23 LAB — MAGNESIUM: Magnesium: 1.7 mg/dL (ref 1.5–2.5)

## 2016-06-23 MED ORDER — SUMATRIPTAN SUCCINATE 100 MG PO TABS: 100.0000 mg | ORAL_TABLET | Freq: Once | ORAL | 2 refills | Status: DC | PRN

## 2016-06-23 NOTE — Progress Notes (Signed)
Assessment and Plan:  1. Hypertension -Continue medication, monitor blood pressure at home. Continue DASH diet.  Reminder to go to the ER if any CP, SOB, nausea, dizziness, severe HA, changes vision/speech, left arm numbness and tingling and jaw pain.  2. Cholesterol -Continue diet and exercise. Check cholesterol.   3. Obesity with co morbidities - long discussion about weight loss, diet, and exercise   4. Vitamin D Def - check level and continue medications.   5. Migraines Better controlled, continue the same  Continue diet and meds as discussed. Further disposition pending results of labs. Over 30 minutes of exam, counseling, chart review, and critical decision making was performed  Future Appointments Date Time Provider [place]  03/21/2017 10:00 AM Vicie Mutters, PA-C [id number] None    HPI 50 y.o. female  presents for 4 month follow up on hypertension, cholesterol, migraines, and vitamin D deficiency.   Her blood pressure has been controlled at home, today their BP is    She does not workout. She denies chest pain, shortness of breath, dizziness.  She is on cholesterol medication pravastatin with negative CPK last visit.  Her cholesterol is not at goal. The cholesterol last visit was:   Lab Results  Component Value Date   CHOL 311 (H) 03/21/2016   HDL 34 (L) 03/21/2016   LDLCALC 234 (H) 03/21/2016   TRIG 213 (H) 03/21/2016   CHOLHDL 9.1 (H) 03/21/2016    Last A1C in the office was:  Lab Results  Component Value Date   HGBA1C 5.1 03/21/2016   She states that her migraines are doing well with the flexeril and verapamil, have migraines every few months.   Patient is on Vitamin D supplement, 10,000   Lab Results  Component Value Date   VD25OH 20 (L) 03/21/2016     In addition she has smoking history with hematuria, sent to urology for referral, with normal evaluation.  BMI is Body mass index is 32.96 kg/m., she is working on diet and exercise. Wt Readings  from Last 3 Encounters:  06/23/16 192 lb (87.1 kg)  05/13/16 194 lb (88 kg)  04/29/16 194 lb (88 kg)     Current Medications:  Current Outpatient Prescriptions on File Prior to Visit  Medication Sig Dispense Refill  . albuterol (PROVENTIL HFA) 108 (90 Base) MCG/ACT inhaler 2 puffs.    . ALPRAZolam (XANAX) 0.25 MG tablet TAKE ONE TABLET BY MOUTH TWICE DAILY AS NEEDED FOR ANXIETY 60 tablet 0  . aspirin-acetaminophen-caffeine (EXCEDRIN MIGRAINE) 250-250-65 MG tablet Take by mouth every 6 (six) hours as needed for headache.    . B Complex Vitamins (VITAMIN-B COMPLEX) TABS Take 1 tablet by mouth.    . Biotin 10 MG CAPS Take by mouth daily.    . cyclobenzaprine (FLEXERIL) 10 MG tablet Take 1 tablet (10 mg total) by mouth at bedtime. 90 tablet 0  . pravastatin (PRAVACHOL) 40 MG tablet Take 40 mg by mouth.    . SUMAtriptan (IMITREX) 100 MG tablet Take 1 tablet (100 mg total) by mouth once as needed for migraine. May repeat in 2 hours if headache persists or recurs. 30 tablet 2  . verapamil (CALAN-SR) 120 MG CR tablet Take 1 tablet (120 mg total) by mouth at bedtime. 30 tablet 11   Current Facility-Administered Medications on File Prior to Visit  Medication Dose Route Frequency Provider Last Rate Last Dose  . 0.9 %  sodium chloride infusion  500 mL Intravenous Continuous Irene Shipper, MD  Medical History:  Past Medical History:  Diagnosis Date  . Anxiety   . Asthma   . Benign hematuria 2011   [place]  . Hyperlipidemia   . Migraine   . Vitamin D deficiency    Allergies:  Allergies  Allergen Reactions  . Shellfish Allergy Anaphylaxis  . Shellfish-Derived Products Anaphylaxis     Review of Systems:  Review of Systems  Constitutional: Negative.  Negative for chills, fever and malaise/fatigue.  HENT: Negative for congestion, ear discharge, ear pain, hearing loss, nosebleeds, sore throat and tinnitus.   Eyes: Negative for blurred vision, double vision, photophobia, pain, discharge  and redness.  Respiratory: Negative.  Negative for shortness of breath and stridor.   Cardiovascular: Negative.  Negative for chest pain.  Gastrointestinal: Positive for constipation. Negative for abdominal pain, blood in stool, diarrhea, heartburn, melena, nausea and vomiting.  Genitourinary: Negative.   Musculoskeletal: Positive for back pain and myalgias. Negative for falls, joint pain and neck pain.  Skin: Negative.   Neurological: Positive for sensory change (intermittent hands and feet at night). Negative for dizziness, tingling, tremors, speech change, focal weakness, seizures, loss of consciousness and headaches.  Psychiatric/Behavioral: Negative.  Negative for suicidal ideas. The patient is not nervous/anxious.   All other systems reviewed and are negative.   Family history- Review and unchanged Social history- Review and unchanged Physical Exam: Pulse 87   Temp 97.7 F (36.5 C) (Temporal)   Resp 14   Ht 5\' 4"  (1.626 m)   Wt 192 lb (87.1 kg)   BMI 32.96 kg/m  Wt Readings from Last 3 Encounters:  06/23/16 192 lb (87.1 kg)  05/13/16 194 lb (88 kg)  04/29/16 194 lb (88 kg)   General Appearance: Well nourished, in no apparent distress. Eyes: PERRLA, EOMs, conjunctiva no swelling or erythema,  Right lacrimal tearing.  Sinuses: No Frontal/maxillary tenderness ENT/Mouth: Ext aud canals clear, TMs without erythema, bulging. No erythema, swelling, or exudate on post pharynx. Right gums with mild swelling/erythema and tenderenss to palpation, no palptaed fluctuance. Tonsils not swollen or erythematous. Hearing normal.  Neck: Supple, thyroid normal.  Respiratory: Respiratory effort normal, BS equal bilaterally without rales, rhonchi, wheezing or stridor.  Cardio: RRR with no MRGs. Brisk peripheral pulses without edema.  Abdomen: Soft, + BS,  Non tender, no guarding, rebound, hernias, masses. Lymphatics: Non tender without lymphadenopathy.  Musculoskeletal: Full ROM, 5/5 strength,  Normal gait. Skin: Warm, dry without rashes, lesions, ecchymosis.  Neuro: Cranial nerves intact. Normal muscle tone, no cerebellar symptoms. Psych: Awake and oriented X 3, normal affect, Insight and Judgment appropriate.    Vicie Mutters, PA-C 11:20 AM [facility]

## 2016-06-24 LAB — HEMOGLOBIN A1C
Hgb A1c MFr Bld: 5.1 % (ref <5.7)
Mean Plasma Glucose: 100 mg/dL

## 2016-06-24 LAB — TSH: TSH: 0.81 m[IU]/L

## 2016-06-24 LAB — VITAMIN B12: VITAMIN B 12: 421 pg/mL (ref 200–1100)

## 2016-06-24 LAB — HIV ANTIBODY (ROUTINE TESTING W REFLEX): HIV: NONREACTIVE

## 2016-06-24 LAB — RPR

## 2016-06-24 LAB — VITAMIN D 25 HYDROXY (VIT D DEFICIENCY, FRACTURES): Vit D, 25-Hydroxy: 33 ng/mL (ref 30–100)

## 2016-07-25 HISTORY — PX: COLONOSCOPY: SHX174

## 2016-09-16 ENCOUNTER — Encounter: Payer: Self-pay | Admitting: Internal Medicine

## 2016-09-19 ENCOUNTER — Encounter: Payer: Self-pay | Admitting: Internal Medicine

## 2016-10-19 ENCOUNTER — Other Ambulatory Visit: Payer: Self-pay

## 2016-10-19 ENCOUNTER — Ambulatory Visit (INDEPENDENT_AMBULATORY_CARE_PROVIDER_SITE_OTHER): Payer: BC Managed Care – PPO | Admitting: Physician Assistant

## 2016-10-19 ENCOUNTER — Encounter: Payer: Self-pay | Admitting: Physician Assistant

## 2016-10-19 VITALS — BP 120/76 | HR 74 | Temp 97.2°F | Resp 14 | Ht 64.0 in | Wt 190.8 lb

## 2016-10-19 DIAGNOSIS — E559 Vitamin D deficiency, unspecified: Secondary | ICD-10-CM

## 2016-10-19 DIAGNOSIS — E785 Hyperlipidemia, unspecified: Secondary | ICD-10-CM | POA: Diagnosis not present

## 2016-10-19 DIAGNOSIS — E6609 Other obesity due to excess calories: Secondary | ICD-10-CM

## 2016-10-19 DIAGNOSIS — G43809 Other migraine, not intractable, without status migrainosus: Secondary | ICD-10-CM | POA: Diagnosis not present

## 2016-10-19 DIAGNOSIS — Z6832 Body mass index (BMI) 32.0-32.9, adult: Secondary | ICD-10-CM | POA: Diagnosis not present

## 2016-10-19 DIAGNOSIS — Z79899 Other long term (current) drug therapy: Secondary | ICD-10-CM

## 2016-10-19 LAB — CBC WITH DIFFERENTIAL/PLATELET
BASOS ABS: 0 {cells}/uL (ref 0–200)
Basophils Relative: 0 %
Eosinophils Absolute: 154 cells/uL (ref 15–500)
Eosinophils Relative: 2 %
HCT: 42.6 % (ref 35.0–45.0)
HEMOGLOBIN: 14.1 g/dL (ref 11.7–15.5)
Lymphocytes Relative: 45 %
Lymphs Abs: 3465 cells/uL (ref 850–3900)
MCH: 28.4 pg (ref 27.0–33.0)
MCHC: 33.1 g/dL (ref 32.0–36.0)
MCV: 85.7 fL (ref 80.0–100.0)
MONOS PCT: 9 %
MPV: 8.8 fL (ref 7.5–12.5)
Monocytes Absolute: 693 cells/uL (ref 200–950)
NEUTROS ABS: 3388 {cells}/uL (ref 1500–7800)
NEUTROS PCT: 44 %
PLATELETS: 263 10*3/uL (ref 140–400)
RBC: 4.97 MIL/uL (ref 3.80–5.10)
RDW: 13.5 % (ref 11.0–15.0)
WBC: 7.7 10*3/uL (ref 3.8–10.8)

## 2016-10-19 LAB — BASIC METABOLIC PANEL WITH GFR
BUN: 6 mg/dL — AB (ref 7–25)
CALCIUM: 9 mg/dL (ref 8.6–10.4)
CHLORIDE: 108 mmol/L (ref 98–110)
CO2: 25 mmol/L (ref 20–31)
CREATININE: 0.71 mg/dL (ref 0.50–1.05)
GFR, Est African American: >89 mL/min (ref >=60)
GFR, Est Non African American: >89 mL/min (ref >=60)
Glucose, Bld: 97 mg/dL (ref 65–99)
Potassium: 4 mmol/L (ref 3.5–5.3)
Sodium: 139 mmol/L (ref 135–146)

## 2016-10-19 LAB — HEPATIC FUNCTION PANEL
ALK PHOS: 82 U/L (ref 33–130)
ALT: 16 U/L (ref 6–29)
AST: 12 U/L (ref 10–35)
Albumin: 3.7 g/dL (ref 3.6–5.1)
BILIRUBIN DIRECT: 0.1 mg/dL (ref <=0.2)
BILIRUBIN INDIRECT: 0.3 mg/dL (ref 0.2–1.2)
BILIRUBIN TOTAL: 0.4 mg/dL (ref 0.2–1.2)
Total Protein: 5.9 g/dL — ABNORMAL LOW (ref 6.1–8.1)

## 2016-10-19 LAB — LIPID PANEL
CHOL/HDL RATIO: 5.8 ratio — AB (ref <5.0)
Cholesterol: 203 mg/dL — ABNORMAL HIGH (ref <200)
HDL: 35 mg/dL — AB (ref >50)
LDL Cholesterol: 145 mg/dL — ABNORMAL HIGH (ref <100)
Triglycerides: 115 mg/dL (ref <150)
VLDL: 23 mg/dL (ref <30)

## 2016-10-19 MED ORDER — VERAPAMIL HCL ER 120 MG PO TBCR: 120.0000 mg | EXTENDED_RELEASE_TABLET | Freq: Every day | ORAL | 11 refills | Status: DC

## 2016-10-19 NOTE — Patient Instructions (Addendum)
Your ears and sinuses are connected by the eustachian tube. When your sinuses are inflamed, this can close off the tube and cause fluid to collect in your middle ear. This can then cause dizziness, popping, clicking, ringing, and echoing in your ears. This is often NOT an infection and does NOT require antibiotics, it is caused by inflammation so the treatments help the inflammation. This can take a long time to get better so please be patient.  Here are things you can do to help with this: - Try the Flonase or Nasonex. Remember to spray each nostril twice towards the outer part of your eye.  Do not sniff but instead pinch your nose and tilt your head back to help the medicine get into your sinuses.  The best time to do this is at bedtime.Stop if you get blurred vision or nose bleeds.  -While drinking fluids, pinch and hold nose close and swallow, to help open eustachian tubes to drain fluid behind ear drums. -Please pick one of the over the counter allergy medications below and take it once daily for allergies.  It will also help with fluid behind ear drums. Claritin or loratadine cheapest but likely the weakest  Zyrtec or certizine at night because it can make you sleepy The strongest is allegra or fexafinadine  Cheapest at [employer], [employer], [employer] -can use decongestant over the counter, please do not use if you have high blood pressure or certain heart conditions.   if worsening HA, changes vision/speech, imbalance, weakness go to the ER   Simple math prevails.    1st - exercise does not produce significant weight loss - at best one converts fat into muscle , "bulks up", loses inches, but usually stays "weight neutral"     2nd - think of your body weightas a check book: If you eat more calories than you burn up - you save money or gain weight .... Or if you spend more money than you put in the check book, ie burn up more calories than you eat, then you lose weight     3rd - if you walk or run 1  mile, you burn up 100 calories - you have to burn up 3,500 calories to lose 1 pound, ie you have to walk/run 35 miles to lose 1 measly pound. So if you want to lose 10 #, then you have to walk/run 350 miles, so.... clearly exercise is not the solution.     4. So if you consume 1,500 calories, then you have to burn up the equivalent of 15 miles to stay weight neutral - It also stands to reason that if you consume 1,500 cal/day and don't lose weight, then you must be burning up about 1,500 cals/day to stay weight neutral.     5. If you really want to lose weight, you must cut your calorie intake 300 calories /day and at that rate you should lose about 1 # every 3 days.   6. Please purchase Dr Fara Olden Fuhrman's book(s) "The End of Dieting" & "Eat to Live" . It has some great concepts and recipes.       Shoulder Impingement Syndrome Rehab Ask your health care provider which exercises are safe for you. Do exercises exactly as told by your health care provider and adjust them as directed. It is normal to feel mild stretching, pulling, tightness, or discomfort as you do these exercises, but you should stop right away if you feel sudden pain or your pain gets  worse.Do not begin these exercises until told by your health care provider. Stretching and range of motion exercise This exercise warms up your muscles and joints and improves the movement and flexibility of your shoulder. This exercise also helps to relieve pain and stiffness. Exercise A: Passive horizontal adduction   1. Sit or stand and pull your left / right elbow across your chest, toward your other shoulder. Stop when you feel a gentle stretch in the back of your shoulder and upper arm.  Keep your arm at shoulder height.  Keep your arm as close to your body as you comfortably can. 2. Hold for __________ seconds. 3. Slowly return to the starting position. Repeat __________ times. Complete this exercise __________ times a day. Strengthening  exercises These exercises build strength and endurance in your shoulder. Endurance is the ability to use your muscles for a long time, even after they get tired. Exercise B: External rotation, isometric  1. Stand or sit in a doorway, facing the door frame. 2. Bend your left / right elbow and place the back of your wrist against the door frame. Only your wrist should be touching the frame. Keep your upper arm at your side. 3. Gently press your wrist against the door frame, as if you are trying to push your arm away from your abdomen.  Avoid shrugging your shoulder while you press your hand against the door frame. Keep your shoulder blade tucked down toward the middle of your back. 4. Hold for __________ seconds. 5. Slowly release the tension, and relax your muscles completely before you do the exercise again. Repeat __________ times. Complete this exercise __________ times a day. Exercise C: Internal rotation, isometric   1. Stand or sit in a doorway, facing the door frame. 2. Bend your left / right elbow and place the inside of your wrist against the door frame. Only your wrist should be touching the frame. Keep your upper arm at your side. 3. Gently press your wrist against the door frame, as if you are trying to push your arm toward your abdomen.  Avoid shrugging your shoulder while you press your hand against the door frame. Keep your shoulder blade tucked down toward the middle of your back. 4. Hold for __________ seconds. 5. Slowly release the tension, and relax your muscles completely before you do the exercise again. Repeat __________ times. Complete this exercise __________ times a day. Exercise D: Scapular protraction, supine   1. Lie on your back on a firm surface. Hold a __________ weight in your left / right hand. 2. Raise your left / right arm straight into the air so your hand is directly above your shoulder joint. 3. Push the weight into the air so your shoulder lifts off of  the surface that you are lying on. Do not move your head, neck, or back. 4. Hold for __________ seconds. 5. Slowly return to the starting position. Let your muscles relax completely before you repeat this exercise. Repeat __________ times. Complete this exercise __________ times a day. Exercise E: Scapular retraction   1. Sit in a stable chair without armrests, or stand. 2. Secure an exercise band to a stable object in front of you so the band is at shoulder height. 3. Hold one end of the exercise band in each hand. Your palms should face down. 4. Squeeze your shoulder blades together and move your elbows slightly behind you. Do not shrug your shoulders while you do this. 5. Hold for __________ seconds.  6. Slowly return to the starting position. Repeat __________ times. Complete this exercise __________ times a day. Exercise F: Shoulder extension   1. Sit in a stable chair without armrests, or stand. 2. Secure an exercise band to a stable object in front of you where the band is above shoulder height. 3. Hold one end of the exercise band in each hand. 4. Straighten your elbows and lift your hands up to shoulder height. 5. Squeeze your shoulder blades together and pull your hands down to the sides of your thighs. Stop when your hands are straight down by your sides. Do not let your hands go behind your body. 6. Hold for __________ seconds. 7. Slowly return to the starting position. Repeat __________ times. Complete this exercise __________ times a day. This information is not intended to replace advice given to you by your health care provider. Make sure you discuss any questions you have with your health care provider. Document Released: 07/11/2005 Document Revised: 03/17/2016 Document Reviewed: 06/13/2015 Elsevier Interactive Patient Education  2017 [employer].

## 2016-10-19 NOTE — Progress Notes (Signed)
Assessment and Plan:   Hypertension -Continue medication, monitor blood pressure at home. Continue DASH diet.  Reminder to go to the ER if any CP, SOB, nausea, dizziness, severe HA, changes vision/speech, left arm numbness and tingling and jaw pain.  Cholesterol -Continue diet and exercise. Check cholesterol.    Obesity with co morbidities - long discussion about weight loss, diet, and exercise    Vitamin D Def - check level and continue medications.    Migraines Better controlled, continue the same  Continue diet and meds as discussed. Further disposition pending results of labs. Over 30 minutes of exam, counseling, chart review, and critical decision making was performed  Future Appointments Date Time Provider [place]  11/03/2016 9:00 AM [facility] PRE[facility]  11/17/2016 3:00 PM Irene Shipper, MD [facility]  03/21/2017 10:00 AM Vicie Mutters, PA-C [facility] None    HPI 51 y.o. female  presents for 4 month follow up on hypertension, cholesterol, migraines, and vitamin D deficiency.   Her blood pressure has been controlled at home, today their BP is BP: 120/76  She does not workout. She denies chest pain, shortness of breath, dizziness.  She is on cholesterol medication pravastatin with negative CPK.   Her cholesterol is not at goal. The cholesterol last visit was:   Lab Results  Component Value Date   CHOL 219 (H) 06/23/2016   HDL 29 (L) 06/23/2016   LDLCALC 147 (H) 06/23/2016   TRIG 217 (H) 06/23/2016   CHOLHDL 7.6 (H) 06/23/2016    Last A1C in the office was:  Lab Results  Component Value Date   HGBA1C 5.1 06/23/2016   She states that her migraines are doing well with the flexeril and verapamil, have migraines every few months.   Patient is on Vitamin D supplement, 10,000   Lab Results  Component Value Date   VD25OH 33 06/23/2016     In addition she has smoking history with hematuria, sent to urology for referral, with normal  evaluation.  BMI is Body mass index is 32.75 kg/m., she is working on diet and exercise. Wt Readings from Last 3 Encounters:  10/19/16 190 lb 12.8 oz (86.5 kg)  06/23/16 192 lb (87.1 kg)  05/13/16 194 lb (88 kg)    Current Medications:  Current Outpatient Prescriptions on File Prior to Visit  Medication Sig Dispense Refill  . albuterol (PROVENTIL HFA) 108 (90 Base) MCG/ACT inhaler 2 puffs.    . ALPRAZolam (XANAX) 0.25 MG tablet TAKE ONE TABLET BY MOUTH TWICE DAILY AS NEEDED FOR ANXIETY 60 tablet 0  . aspirin-acetaminophen-caffeine (EXCEDRIN MIGRAINE) 250-250-65 MG tablet Take by mouth every 6 (six) hours as needed for headache.    . B Complex Vitamins (VITAMIN-B COMPLEX) TABS Take 1 tablet by mouth.    . Biotin 10 MG CAPS Take by mouth daily.    . cyclobenzaprine (FLEXERIL) 10 MG tablet Take 1 tablet (10 mg total) by mouth at bedtime. 90 tablet 0  . pravastatin (PRAVACHOL) 40 MG tablet Take 40 mg by mouth.    . SUMAtriptan (IMITREX) 100 MG tablet Take 1 tablet (100 mg total) by mouth once as needed for migraine. May repeat in 2 hours if headache persists or recurs. 30 tablet 2   Current Facility-Administered Medications on File Prior to Visit  Medication Dose Route Frequency Provider Last Rate Last Dose  . 0.9 %  sodium chloride infusion  500 mL Intravenous Continuous Irene Shipper, MD  Medical History:  Past Medical History:  Diagnosis Date  . Anxiety   . Asthma   . Benign hematuria 2011   [place]  . Hyperlipidemia   . Migraine   . Vitamin D deficiency    Allergies:  Allergies  Allergen Reactions  . Shellfish Allergy Anaphylaxis  . Shellfish-Derived Products Anaphylaxis     Review of Systems:  Review of Systems  Constitutional: Negative.  Negative for chills, fever and malaise/fatigue.  HENT: Negative for congestion, ear discharge, ear pain, hearing loss, nosebleeds, sore throat and tinnitus.   Eyes: Negative for blurred vision, double vision, photophobia, pain,  discharge and redness.  Respiratory: Negative.  Negative for shortness of breath and stridor.   Cardiovascular: Negative.  Negative for chest pain.  Gastrointestinal: Positive for constipation. Negative for abdominal pain, blood in stool, diarrhea, heartburn, melena, nausea and vomiting.  Genitourinary: Negative.   Musculoskeletal: Positive for back pain and myalgias. Negative for falls, joint pain and neck pain.  Skin: Negative.   Neurological: Positive for sensory change (intermittent hands and feet at night). Negative for dizziness, tingling, tremors, speech change, focal weakness, seizures, loss of consciousness and headaches.  Psychiatric/Behavioral: Negative.  Negative for suicidal ideas. The patient is not nervous/anxious.   All other systems reviewed and are negative.   Family history- Review and unchanged Social history- Review and unchanged Physical Exam: BP 120/76   Pulse 74   Temp 97.2 F (36.2 C)   Resp 14   Ht 5\' 4"  (1.626 m)   Wt 190 lb 12.8 oz (86.5 kg)   SpO2 98%   BMI 32.75 kg/m  Wt Readings from Last 3 Encounters:  10/19/16 190 lb 12.8 oz (86.5 kg)  06/23/16 192 lb (87.1 kg)  05/13/16 194 lb (88 kg)   General Appearance: Well nourished, in no apparent distress. Eyes: PERRLA, EOMs, conjunctiva no swelling or erythema,  Right lacrimal tearing.  Sinuses: No Frontal/maxillary tenderness ENT/Mouth: Ext aud canals clear, TMs without erythema, bulging. No erythema, swelling, or exudate on post pharynx. Right gums with mild swelling/erythema and tenderenss to palpation, no palptaed fluctuance. Tonsils not swollen or erythematous. Hearing normal.  Neck: Supple, thyroid normal.  Respiratory: Respiratory effort normal, BS equal bilaterally without rales, rhonchi, wheezing or stridor.  Cardio: RRR with no MRGs. Brisk peripheral pulses without edema.  Abdomen: Soft, + BS,  Non tender, no guarding, rebound, hernias, masses. Lymphatics: Non tender without lymphadenopathy.   Musculoskeletal: Full ROM, 5/5 strength, Normal gait. Skin: Warm, dry without rashes, lesions, ecchymosis.  Neuro: Cranial nerves intact. Normal muscle tone, no cerebellar symptoms. Psych: Awake and oriented X 3, normal affect, Insight and Judgment appropriate.    Vicie Mutters, PA-C 9:00 AM [facility]

## 2016-10-20 LAB — VITAMIN D 25 HYDROXY (VIT D DEFICIENCY, FRACTURES): VIT D 25 HYDROXY: 26 ng/mL — AB (ref 30–100)

## 2016-10-20 LAB — TSH: TSH: 0.98 mIU/L

## 2016-10-20 LAB — MAGNESIUM: MAGNESIUM: 1.7 mg/dL (ref 1.5–2.5)

## 2016-11-03 ENCOUNTER — Encounter: Payer: Self-pay | Admitting: Internal Medicine

## 2016-11-03 ENCOUNTER — Ambulatory Visit (AMBULATORY_SURGERY_CENTER): Payer: Self-pay

## 2016-11-03 VITALS — Ht 63.0 in | Wt 187.7 lb

## 2016-11-03 DIAGNOSIS — Z86010 Personal history of colonic polyps: Secondary | ICD-10-CM

## 2016-11-03 MED ORDER — NA SULFATE-K SULFATE-MG SULF 17.5-3.13-1.6 GM/177ML PO SOLN: 1.0000 | Freq: Once | ORAL | 0 refills | Status: AC

## 2016-11-03 NOTE — Progress Notes (Signed)
Denies allergies to eggs or soy products. Denies complication of anesthesia or sedation. Denies use of weight loss medication. Denies use of O2.   Emmi instructions given for colonoscopy.  

## 2016-11-17 ENCOUNTER — Ambulatory Visit (AMBULATORY_SURGERY_CENTER): Payer: BC Managed Care – PPO | Admitting: Internal Medicine

## 2016-11-17 ENCOUNTER — Encounter: Payer: Self-pay | Admitting: Internal Medicine

## 2016-11-17 VITALS — BP 120/80 | HR 72 | Temp 97.8°F | Resp 15 | Ht 63.0 in | Wt 187.0 lb

## 2016-11-17 DIAGNOSIS — D123 Benign neoplasm of transverse colon: Secondary | ICD-10-CM | POA: Diagnosis not present

## 2016-11-17 DIAGNOSIS — Z86010 Personal history of colonic polyps: Secondary | ICD-10-CM | POA: Diagnosis present

## 2016-11-17 MED ORDER — SODIUM CHLORIDE 0.9 % IV SOLN: 500.0000 mL | INTRAVENOUS | Status: DC

## 2016-11-17 NOTE — Progress Notes (Signed)
Patient awakening,vss,report to rn 

## 2016-11-17 NOTE — Patient Instructions (Signed)
YOU HAD AN ENDOSCOPIC PROCEDURE TODAY AT [facility]:   Refer to the procedure report that was given to you for any specific questions about what was found during the examination.  If the procedure report does not answer your questions, please call your gastroenterologist to clarify.  If you requested that your care partner not be given the details of your procedure findings, then the procedure report has been included in a sealed envelope for you to review at your convenience later.  YOU SHOULD EXPECT: Some feelings of bloating in the abdomen. Passage of more gas than usual.  Walking can help get rid of the air that was put into your GI tract during the procedure and reduce the bloating. If you had a lower endoscopy (such as a colonoscopy or flexible sigmoidoscopy) you may notice spotting of blood in your stool or on the toilet paper. If you underwent a bowel prep for your procedure, you may not have a normal bowel movement for a few days.  Please Note:  You might notice some irritation and congestion in your nose or some drainage.  This is from the oxygen used during your procedure.  There is no need for concern and it should clear up in a day or so.  SYMPTOMS TO REPORT IMMEDIATELY:   Following lower endoscopy (colonoscopy or flexible sigmoidoscopy):  Excessive amounts of blood in the stool  Significant tenderness or worsening of abdominal pains  Swelling of the abdomen that is new, acute  Fever of 100F or higher    For urgent or emergent issues, a gastroenterologist can be reached at any hour by calling [phone].   DIET:  We do recommend a small meal at first, but then you may proceed to your regular diet.  Drink plenty of fluids but you should avoid alcoholic beverages for 24 hours.  ACTIVITY:  You should plan to take it easy for the rest of today and you should NOT DRIVE or use heavy machinery until tomorrow (because of the sedation medicines used during the test).     FOLLOW UP: Our staff will call the number listed on your records the next business day following your procedure to check on you and address any questions or concerns that you may have regarding the information given to you following your procedure. If we do not reach you, we will leave a message.  However, if you are feeling well and you are not experiencing any problems, there is no need to return our call.  We will assume that you have returned to your regular daily activities without incident.  If any biopsies were taken you will be contacted by phone or by letter within the next 1-3 weeks.  Please call us at [phone] if you have not heard about the biopsies in 3 weeks.    SIGNATURES/CONFIDENTIALITY: You and/or your care partner have signed paperwork which will be entered into your electronic medical record.  These signatures attest to the fact that that the information above on your After Visit Summary has been reviewed and is understood.  Full responsibility of the confidentiality of this discharge information lies with you and/or your care-partner.   INFORMATION ON POLYPS GIVEN TO YOU TODAY  AWAIT PATHOLOGY REPORT IN A LETTER FROM DR Henrene Pastor

## 2016-11-17 NOTE — Op Note (Signed)
[facility] Patient Name: Kimberly Zhang Procedure Date: 11/17/2016 3:38 PM MRN: [id number] Endoscopist: Docia Chuck. Henrene Pastor , MD Age: 51 Referring MD:  Date of Birth: 08-03-1965 Gender: Female Account #: [id number] Procedure:                Colonoscopy, with cold snare polypectomy X2 Indications:              High risk colon cancer surveillance: Personal                            history of adenoma (10 mm or greater in size), High                            risk colon cancer surveillance: Personal history of                            adenoma with villous component. Large piecemeal                            polypectomy right colon September 2017. Now for                            follow-up Medicines:                Monitored Anesthesia Care Procedure:                Pre-Anesthesia Assessment:                           - Prior to the procedure, a History and Physical                            was performed, and patient medications and                            allergies were reviewed. The patient's tolerance of                            previous anesthesia was also reviewed. The risks                            and benefits of the procedure and the sedation                            options and risks were discussed with the patient.                            All questions were answered, and informed consent                            was obtained. Prior Anticoagulants: The patient has                            taken no previous anticoagulant or antiplatelet  agents. ASA Grade Assessment: II - A patient with                            mild systemic disease. After reviewing the risks                            and benefits, the patient was deemed in                            satisfactory condition to undergo the procedure.                           After obtaining informed consent, the colonoscope                            was passed under  direct vision. Throughout the                            procedure, the patient's blood pressure, pulse, and                            oxygen saturations were monitored continuously. The                            Colonoscope was introduced through the anus and                            advanced to the the cecum, identified by                            appendiceal orifice and ileocecal valve. The                            ileocecal valve, appendiceal orifice, and rectum                            were photographed. The quality of the bowel                            preparation was excellent. The colonoscopy was                            performed without difficulty. The patient tolerated                            the procedure well. The bowel preparation used was                            SUPREP. Scope In: 3:55:35 PM Scope Out: 4:16:01 PM Scope Withdrawal Time: 0 hours 16 minutes 31 seconds  Total Procedure Duration: 0 hours 20 minutes 26 seconds  Findings:                 Two polyps were found in the transverse colon. The  polyps were 2 mm in size. These polyps were removed                            with a cold snare. Resection and retrieval were                            complete.                           . The area of prior polypectomy was easily                            identified by the marking tattoo. Careful                            inspection in antegrade and retrograde views                            reveals no evidence of residual polyp. The exam was                            otherwise without abnormality on direct and                            retroflexion views. Complications:            No immediate complications. Estimated blood loss:                            None. Estimated Blood Loss:     Estimated blood loss: none. Impression:               - Two 2 mm polyps in the transverse colon, removed                            with a  cold snare. Resected and retrieved.                           - No residual polypoid tissue at prior polypectomy                            site. The examination was otherwise normal on                            direct and retroflexion views. Recommendation:           - Repeat colonoscopy in 3 years for surveillance.                           - Patient has a contact number available for                            emergencies. The signs and symptoms of potential  delayed complications were discussed with the                            patient. Return to normal activities tomorrow.                            Written discharge instructions were provided to the                            patient.                           - Resume previous diet.                           - Continue present medications.                           - Await pathology results. Docia Chuck. Henrene Pastor, MD 11/17/2016 4:25:51 PM This report has been signed electronically.

## 2016-11-17 NOTE — Progress Notes (Signed)
Called to room to assist during endoscopic procedure.  Patient ID and intended procedure confirmed with present staff. Received instructions for my participation in the procedure from the performing physician.  

## 2016-11-18 ENCOUNTER — Telehealth: Payer: Self-pay

## 2016-11-18 NOTE — Telephone Encounter (Signed)
  Follow up Call-  Call back number 11/17/2016 05/13/2016  Post procedure Call Back phone  # [phone]  Permission to leave phone message Yes Yes  Some recent data might be hidden     Patient questions:  Do you have a fever, pain , or abdominal swelling? No. Pain Score  0 *  Have you tolerated food without any problems? Yes  Have you been able to return to your normal activities? Yes.    Do you have any questions about your discharge instructions: Diet   No. Medications  No. Follow up visit  No.  Do you have questions or concerns about your Care? No.  Actions: * If pain score is 4 or above: No action needed, pain <4.

## 2016-11-23 ENCOUNTER — Encounter: Payer: Self-pay | Admitting: Internal Medicine

## 2016-11-25 ENCOUNTER — Other Ambulatory Visit: Payer: Self-pay | Admitting: Internal Medicine

## 2016-11-29 ENCOUNTER — Other Ambulatory Visit: Payer: Self-pay | Admitting: Physician Assistant

## 2016-11-29 DIAGNOSIS — G43809 Other migraine, not intractable, without status migrainosus: Secondary | ICD-10-CM

## 2016-11-29 MED ORDER — TOPIRAMATE 25 MG PO TABS: 25.0000 mg | ORAL_TABLET | Freq: Two times a day (BID) | ORAL | 0 refills | Status: DC

## 2016-11-29 MED ORDER — CYCLOBENZAPRINE HCL 10 MG PO TABS: 10.0000 mg | ORAL_TABLET | Freq: Every day | ORAL | 0 refills | Status: DC

## 2016-11-29 MED ORDER — ROSUVASTATIN CALCIUM 20 MG PO TABS: 20.0000 mg | ORAL_TABLET | Freq: Every day | ORAL | 0 refills | Status: DC

## 2017-02-26 ENCOUNTER — Other Ambulatory Visit: Payer: Self-pay | Admitting: Physician Assistant

## 2017-03-21 ENCOUNTER — Encounter: Payer: Self-pay | Admitting: Physician Assistant

## 2017-05-01 NOTE — Progress Notes (Signed)
Complete Physical  Assessment and Plan:  Hyperlipidemia - -continue medications, check lipids, decrease fatty foods, increase activity.  -     CBC with Differential/Platelet -     BASIC METABOLIC PANEL WITH GFR -     Hepatic function panel -     Lipid panel -     EKG 12-Lead  Obesity -     TSH - long discussion about weight loss, diet, and exercise   Benign hematuria Check urine  Asthma, unspecified asthma severity, uncomplicated  Vitamin D deficiency -     VITAMIN D 25 Hydroxy (Vit-D Deficiency, Fractures)  Medication management -     Magnesium  Female genuine stress incontinence Weight loss advised  Anxiety  Screening for diabetes mellitus -     Hemoglobin A1c  Screening for hematuria or proteinuria -     Urinalysis, Routine w reflex microscopic (not at [facility]) -     Microalbumin / creatinine urine ratio  Anemia, unspecified anemia type -     Iron and TIBC -     Ferritin  Encounter for general adult medical examination with abnormal findings  Other migraine without status migrainosus, not intractable -     dexamethasone (DECADRON) injection 10 mg; Inject 1 mL (10 mg total) into the muscle once. -     topiramate (TOPAMAX) 50 MG tablet; TAKE 1 TABLET(50 MG) BY MOUTH TWICE DAILY  -     metoCLOPramide (REGLAN) 5 MG tablet; Take 1 tablet (5 mg total) by mouth every 8 (eight) hours as needed for nausea (migraines). -     rizatriptan (MAXALT) 10 MG tablet; Take 1 tablet (10 mg total) by mouth as needed for migraine. May repeat in 2 hours if needed - normal neuro, will adjust meds - Will go to the ER if worsening headache, changes vision/speech, imbalance, weakness.   Needs flu shot -     FLU VACCINE MDCK QUAD W/Preservative  Sinus congestion -     dexamethasone (DECADRON) injection 10 mg; Inject 1 mL (10 mg total) into the muscle once.  Screening for cervical cancer -     Cytology - PAP   Discussed med's effects and SE's. Screening labs and tests as requested  with regular follow-up as recommended. Over 40 minutes of exam, counseling, chart review, and complex, high level critical decision making was performed this visit.   HPI  51 y.o. 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  Her blood pressure has been controlled at home, today their BP is BP: 118/74 She does workout, she walks. She denies chest pain, shortness of breath, dizziness.   She has headaches daily, got better with verapamil and flexeril and topamax BID but have come back, now happening daily x 4 days. Blurry vision at the end of the day only. Some sensitivity to light or sound. Imitrex did not help. She has been having sinus pain, ear popping.   She is on cholesterol medication but admits that she does not take it consistently, her cholesterol is not at goal. The cholesterol last visit was:   Lab Results  Component Value Date   CHOL 203 (H) 10/19/2016   HDL 35 (L) 10/19/2016   LDLCALC 145 (H) 10/19/2016   TRIG 115 10/19/2016   CHOLHDL 5.8 (H) 10/19/2016    Last A1C in the office was:  Lab Results  Component Value Date   HGBA1C 5.1 06/23/2016   Patient is on Vitamin D supplement.   Lab Results  Component Value Date   VD25OH 26 (L) 10/19/2016     BMI is Body mass index is 32.19 kg/m., she is working on diet and exercise. Bowel of cereal but does not eat breakfast everyday. Salad for lunch. Snacks on apple and grapes.  Wt Readings from Last 3 Encounters:  05/02/17 184 lb 9.6 oz (83.7 kg)  11/17/16 187 lb (84.8 kg)  11/03/16 187 lb 11.2 oz (85.1 kg)    Current Medications:  Current Outpatient Prescriptions on File Prior to Visit  Medication Sig Dispense Refill  . albuterol (PROVENTIL HFA) 108 (90 Base) MCG/ACT inhaler 2 puffs.    . ALPRAZolam (XANAX) 0.25 MG tablet TAKE ONE  TABLET BY MOUTH TWICE DAILY AS NEEDED FOR ANXIETY 60 tablet 0  . aspirin-acetaminophen-caffeine (EXCEDRIN MIGRAINE) 250-250-65 MG tablet Take by mouth every 6 (six) hours as needed for headache.    . B Complex Vitamins (VITAMIN-B COMPLEX) TABS Take 1 tablet by mouth.    . Biotin 10 MG CAPS Take by mouth daily.    . cyclobenzaprine (FLEXERIL) 10 MG tablet Take 1 tablet (10 mg total) by mouth at bedtime. 90 tablet 0  . rosuvastatin (CRESTOR) 20 MG tablet TAKE 1 TABLET(20 MG) BY MOUTH DAILY 90 tablet 0  . topiramate (TOPAMAX) 25 MG tablet TAKE 1 TABLET(25 MG) BY MOUTH TWICE DAILY 180 tablet 0  . verapamil (CALAN-SR) 120 MG CR tablet Take 1 tablet (120 mg total) by mouth at bedtime. 30 tablet 11   Current Facility-Administered Medications on File Prior to Visit  Medication Dose Route Frequency Provider Last Rate Last Dose  . 0.9 %  sodium chloride infusion  500 mL Intravenous Continuous Irene Shipper, MD      . 0.9 %  sodium chloride infusion  500 mL Intravenous Continuous Irene Shipper, MD       Allergies:  Allergies  Allergen Reactions  . Shellfish Allergy Anaphylaxis  . 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   Health Maintenance:   Immunization History  Administered Date(s) Administered  . DTaP 10/24/2010  . Influenza Inj Mdck Quad With Preservative 05/02/2017  . Influenza Split 05/11/2012, 05/13/2013  . Influenza-Unspecified 05/11/2015  . PPD Test 08/27/2013, 09/23/2014  . Pneumococcal Polysaccharide-23 08/27/2013  . Tdap 10/24/2010   Tetanus: 2012 Pneumovax: 2015 Prevnar 13: due age 50 Flu vaccine: 2018 Zostavax: N/A LMP: s/p TAH Pap: 2013 negative looking for GYN MGM: 2016  DEXA: N/A Colonoscopy: 10/2016  Patient Care Team: Unk Pinto, MD as PCP - General  (Internal Medicine) Kathie Rhodes, MD as Consulting Physician (Urology)  Surgical History:  She has a past surgical history that includes LEEP; Abdominal hysterectomy (1998); and Tonsilectomy/adenoidectomy with myringotomy. Family History:  Herfamily history includes Alzheimer's disease in her other; Cancer in her maternal aunt and maternal uncle; Depression in her other; Diabetes in her paternal grandmother; Healthy in her daughter, sister, and son; Heart disease in her maternal aunt; Hyperlipidemia in her mother; Liver disease in her father; Migraines in her mother. Social History:  She reports that she quit smoking about 19 months ago. She has never used smokeless tobacco. She reports that she drinks alcohol. She reports that she does not use drugs.  Review of Systems: Review of Systems  Constitutional: Negative.   Eyes: Negative.   Respiratory: Negative.   Cardiovascular: Negative.   Gastrointestinal: Negative.   Genitourinary: Negative.   Musculoskeletal: Negative for joint pain.  Skin: Negative.   Neurological: Positive for headaches. Negative for dizziness, tingling, tremors, sensory change, speech change, focal weakness, seizures and loss of consciousness.  Endo/Heme/Allergies: Negative.   Psychiatric/Behavioral: Negative.     Physical Exam: Estimated body mass index is 32.19 kg/m as calculated from the following:   Height as of this encounter: 5' 3.5" (1.613 m).   Weight as of this encounter: 184 lb 9.6 oz (83.7 kg). BP 118/74   Pulse 75   Temp (!) 97.2 F (36.2 C)   Resp 18   Ht 5' 3.5" (1.613 m)   Wt 184 lb 9.6 oz (83.7 kg)   SpO2 99%   BMI 32.19 kg/m  General Appearance: Well nourished, in no apparent distress.  Eyes: PERRLA, EOMs, conjunctiva no swelling or erythema, normal fundi and vessels.  Sinuses: No Frontal/maxillary tenderness  ENT/Mouth: Ext aud canals clear, normal light reflex with TMs without erythema, bulging. Good dentition. No erythema, swelling,  or exudate on post pharynx. Tonsils not swollen or erythematous. Hearing normal.  Neck: Supple, thyroid normal. No bruits  Respiratory: Respiratory effort normal, BS equal bilaterally without rales, rhonchi, wheezing or stridor.  Cardio: RRR without murmurs, rubs or gallops. Brisk peripheral pulses without edema.  Chest: symmetric, with normal excursions and percussion.  Breasts: Symmetric, without lumps, nipple discharge, retractions.  Abdomen: Soft, nontender, no guarding, rebound, hernias, masses, or organomegaly.  Lymphatics: Non tender without lymphadenopathy.  Genitourinary: defer Musculoskeletal: Full ROM all peripheral extremities,5/5 strength, and normal gait.  Skin: Warm, dry without rashes, lesions, ecchymosis. Neuro: Cranial nerves intact, reflexes equal bilaterally. Normal muscle tone, no cerebellar symptoms. Sensation intact.  Psych: Awake and oriented X 3, normal affect, Insight and Judgment appropriate.   EKG: defer AORTA SCAN: WNL   Vicie Mutters 2:21 PM [facility]

## 2017-05-02 ENCOUNTER — Other Ambulatory Visit (HOSPITAL_COMMUNITY)
Admission: RE | Admit: 2017-05-02 | Discharge: 2017-05-02 | Disposition: A | Discharge status: Home / Self Care | Payer: BC Managed Care – PPO | Source: Ambulatory Visit | Attending: Physician Assistant | Admitting: Physician Assistant

## 2017-05-02 ENCOUNTER — Encounter: Payer: Self-pay | Admitting: Physician Assistant

## 2017-05-02 ENCOUNTER — Ambulatory Visit (INDEPENDENT_AMBULATORY_CARE_PROVIDER_SITE_OTHER): Payer: BC Managed Care – PPO | Admitting: Physician Assistant

## 2017-05-02 VITALS — BP 118/74 | HR 75 | Temp 97.2°F | Resp 18 | Ht 63.5 in | Wt 184.6 lb

## 2017-05-02 DIAGNOSIS — R6889 Other general symptoms and signs: Secondary | ICD-10-CM

## 2017-05-02 DIAGNOSIS — N029 Recurrent and persistent hematuria with unspecified morphologic changes: Secondary | ICD-10-CM

## 2017-05-02 DIAGNOSIS — I10 Essential (primary) hypertension: Secondary | ICD-10-CM

## 2017-05-02 DIAGNOSIS — Z6832 Body mass index (BMI) 32.0-32.9, adult: Secondary | ICD-10-CM

## 2017-05-02 DIAGNOSIS — Z0001 Encounter for general adult medical examination with abnormal findings: Secondary | ICD-10-CM | POA: Diagnosis not present

## 2017-05-02 DIAGNOSIS — Z130 Encounter for screening for diseases of the blood and blood-forming organs and certain disorders involving the immune mechanism: Secondary | ICD-10-CM | POA: Diagnosis not present

## 2017-05-02 DIAGNOSIS — J45909 Unspecified asthma, uncomplicated: Secondary | ICD-10-CM

## 2017-05-02 DIAGNOSIS — E785 Hyperlipidemia, unspecified: Secondary | ICD-10-CM

## 2017-05-02 DIAGNOSIS — E559 Vitamin D deficiency, unspecified: Secondary | ICD-10-CM | POA: Diagnosis not present

## 2017-05-02 DIAGNOSIS — Z131 Encounter for screening for diabetes mellitus: Secondary | ICD-10-CM

## 2017-05-02 DIAGNOSIS — Z23 Encounter for immunization: Secondary | ICD-10-CM | POA: Diagnosis not present

## 2017-05-02 DIAGNOSIS — Z136 Encounter for screening for cardiovascular disorders: Secondary | ICD-10-CM | POA: Diagnosis not present

## 2017-05-02 DIAGNOSIS — F419 Anxiety disorder, unspecified: Secondary | ICD-10-CM

## 2017-05-02 DIAGNOSIS — N393 Stress incontinence (female) (male): Secondary | ICD-10-CM | POA: Diagnosis not present

## 2017-05-02 DIAGNOSIS — Z79899 Other long term (current) drug therapy: Secondary | ICD-10-CM | POA: Diagnosis not present

## 2017-05-02 DIAGNOSIS — R0981 Nasal congestion: Secondary | ICD-10-CM

## 2017-05-02 DIAGNOSIS — Z124 Encounter for screening for malignant neoplasm of cervix: Secondary | ICD-10-CM | POA: Insufficient documentation

## 2017-05-02 DIAGNOSIS — G43809 Other migraine, not intractable, without status migrainosus: Secondary | ICD-10-CM

## 2017-05-02 DIAGNOSIS — E6609 Other obesity due to excess calories: Secondary | ICD-10-CM

## 2017-05-02 MED ORDER — RIZATRIPTAN BENZOATE 10 MG PO TABS: 10.0000 mg | ORAL_TABLET | ORAL | 0 refills | Status: DC | PRN

## 2017-05-02 MED ORDER — TOPIRAMATE 50 MG PO TABS: ORAL_TABLET | ORAL | 3 refills | Status: DC

## 2017-05-02 MED ORDER — DEXAMETHASONE SODIUM PHOSPHATE 100 MG/10ML IJ SOLN: 10.0000 mg | Freq: Once | INTRAMUSCULAR | Status: DC

## 2017-05-02 MED ORDER — METOCLOPRAMIDE HCL 5 MG PO TABS: 5.0000 mg | ORAL_TABLET | Freq: Three times a day (TID) | ORAL | 0 refills | Status: DC | PRN

## 2017-05-02 NOTE — Patient Instructions (Addendum)
[facility]  7 a.m.-6:30 p.m., Monday 7 a.m.-5 p.m., Tuesday-Friday Schedule an appointment by calling [phone].  Encourage you to get the 3D Mammogram  The 3D Mammogram is much more specific and sensitive to pick up breast cancer. For women with fibrocystic breast or lumpy breast it can be hard to determine if it is cancer or not but the 3D mammogram is able to tell this difference which cuts back on unneeded additional tests or scary call backs.   - over 40% increase in detection of breast cancer - over 40% reduction in false positives.  - fewer call backs - reduced anxiety - improved outcomes - [facility] [employer] [facility]  Drink 80-100 oz a day of water, measure it out Eat 3 meals a day, have to do breakfast, eat protein- hard boiled eggs, protein bar like nature valley protein bar, greek yogurt like oikos triple zero, chobani 100, or light n fit greek,  Try to do veggie snacks like cucumbers, peppers Avoid prepackaged items Increase veggies  Please pick one of the over the counter allergy medications below and take it once daily for allergies.  Claritin or loratadine cheapest but likely the weakest  Zyrtec or certizine at night because it can make you sleepy The strongest is allegra or fexafinadine  Cheapest at [employer], [employer], [employer]    Please remember, common headache triggers are: sleep deprivation, dehydration, overheating, stress, hypoglycemia or skipping meals and blood sugar fluctuations, excessive pain medications or excessive alcohol use or caffeine withdrawal.   Some people have food triggers such as aged cheese, orange juice or chocolate, especially dark chocolate, or MSG (monosodium glutamate). Try to avoid these headache triggers as much possible.   It may be helpful to keep a headache diary to figure out what makes your headaches worse or brings them on and what alleviates them. Some people report headache onset after exercise but studies have  shown that regular exercise may actually prevent headaches from coming. If you have exercise-induced headaches, please make sure that you drink plenty of fluid before and after exercising and that you do not over do it and do not overheat.  Also you can try CoQ10 100mg  TID or B2 400mg  a day as prevention.   Also there is such a thing called rebound headache from over use of acute medications.  Please do not use rescue or acute medications more than 10 days a month or more than 3 days per week, this can cause a withdrawal and a rebound headache.   Please go to the ER if there is weakness, thunderclap headache, visual changes, or any concerning factors

## 2017-05-03 LAB — CBC WITH DIFFERENTIAL/PLATELET
BASOS ABS: 67 {cells}/uL (ref 0–200)
Basophils Relative: 0.7 %
EOS PCT: 2.1 %
Eosinophils Absolute: 200 cells/uL (ref 15–500)
HCT: 41.6 % (ref 35.0–45.0)
Hemoglobin: 14.1 g/dL (ref 11.7–15.5)
LYMPHS ABS: 4997 {cells}/uL — AB (ref 850–3900)
MCH: 29 pg (ref 27.0–33.0)
MCHC: 33.9 g/dL (ref 32.0–36.0)
MCV: 85.6 fL (ref 80.0–100.0)
MPV: 9.4 fL (ref 7.5–12.5)
Monocytes Relative: 7.6 %
NEUTROS ABS: 3515 {cells}/uL (ref 1500–7800)
NEUTROS PCT: 37 %
Platelets: 274 10*3/uL (ref 140–400)
RBC: 4.86 10*6/uL (ref 3.80–5.10)
RDW: 12.2 % (ref 11.0–15.0)
Total Lymphocyte: 52.6 %
WBC: 9.5 10*3/uL (ref 3.8–10.8)
WBCMIX: 722 {cells}/uL (ref 200–950)

## 2017-05-03 LAB — HEPATIC FUNCTION PANEL
AG Ratio: 1.7 (calc) (ref 1.0–2.5)
ALT: 20 U/L (ref 6–29)
AST: 15 U/L (ref 10–35)
Albumin: 4 g/dL (ref 3.6–5.1)
Alkaline phosphatase (APISO): 81 U/L (ref 33–130)
Bilirubin, Direct: 0 mg/dL (ref 0.0–0.2)
Globulin: 2.4 g/dL (ref 1.9–3.7)
Indirect Bilirubin: 0.3 mg/dL (ref 0.2–1.2)
Total Bilirubin: 0.3 mg/dL (ref 0.2–1.2)
Total Protein: 6.4 g/dL (ref 6.1–8.1)

## 2017-05-03 LAB — URINALYSIS, ROUTINE W REFLEX MICROSCOPIC
BILIRUBIN URINE: NEGATIVE
Bacteria, UA: NONE SEEN /HPF
GLUCOSE, UA: NEGATIVE
Hyaline Cast: NONE SEEN /LPF
KETONES UR: NEGATIVE
LEUKOCYTES UA: NEGATIVE
NITRITE: NEGATIVE
PROTEIN: NEGATIVE
SPECIFIC GRAVITY, URINE: 1.017 (ref 1.001–1.03)
pH: 7 (ref 5.0–8.0)

## 2017-05-03 LAB — IRON, TOTAL/TOTAL IRON BINDING CAP
%SAT: 18 % (ref 11–50)
Iron: 59 ug/dL (ref 45–160)
TIBC: 329 mcg/dL (calc) (ref 250–450)

## 2017-05-03 LAB — BASIC METABOLIC PANEL WITH GFR
BUN: 9 mg/dL (ref 7–25)
CALCIUM: 9.3 mg/dL (ref 8.6–10.4)
CHLORIDE: 105 mmol/L (ref 98–110)
CO2: 27 mmol/L (ref 20–32)
Creat: 0.74 mg/dL (ref 0.50–1.05)
GFR, Est African American: 109 mL/min/{1.73_m2} (ref >=60)
GFR, Est Non African American: 94 mL/min/{1.73_m2} (ref >=60)
GLUCOSE: 76 mg/dL (ref 65–99)
POTASSIUM: 3.9 mmol/L (ref 3.5–5.3)
SODIUM: 139 mmol/L (ref 135–146)

## 2017-05-03 LAB — LIPID PANEL
CHOLESTEROL: 225 mg/dL — AB (ref <200)
HDL: 36 mg/dL — AB (ref >50)
LDL Cholesterol (Calc): 142 mg/dL (calc) — ABNORMAL HIGH
NON-HDL CHOLESTEROL (CALC): 189 mg/dL — AB (ref <130)
Total CHOL/HDL Ratio: 6.3 (calc) — ABNORMAL HIGH (ref <5.0)
Triglycerides: 330 mg/dL — ABNORMAL HIGH (ref <150)

## 2017-05-03 LAB — HEMOGLOBIN A1C
Hgb A1c MFr Bld: 5.3 % of total Hgb (ref <5.7)
MEAN PLASMA GLUCOSE: 105 (calc)
eAG (mmol/L): 5.8 (calc)

## 2017-05-03 LAB — TSH: TSH: 0.76 m[IU]/L

## 2017-05-03 LAB — MICROALBUMIN / CREATININE URINE RATIO
Creatinine, Urine: 109 mg/dL (ref 20–275)
Microalb Creat Ratio: 6 mcg/mg creat (ref <30)
Microalb, Ur: 0.6 mg/dL

## 2017-05-03 LAB — VITAMIN D 25 HYDROXY (VIT D DEFICIENCY, FRACTURES): Vit D, 25-Hydroxy: 25 ng/mL — ABNORMAL LOW (ref 30–100)

## 2017-05-03 LAB — VITAMIN B12: Vitamin B-12: 439 pg/mL (ref 200–1100)

## 2017-05-05 LAB — CYTOLOGY - PAP
DIAGNOSIS: NEGATIVE
HPV: NOT DETECTED

## 2017-05-24 ENCOUNTER — Other Ambulatory Visit: Payer: Self-pay | Admitting: Physician Assistant

## 2017-05-24 MED ORDER — LEVOCETIRIZINE DIHYDROCHLORIDE 5 MG PO TABS: 5.0000 mg | ORAL_TABLET | Freq: Every evening | ORAL | 5 refills | Status: DC

## 2017-05-25 ENCOUNTER — Other Ambulatory Visit: Payer: Self-pay | Admitting: Internal Medicine

## 2017-09-01 ENCOUNTER — Other Ambulatory Visit: Payer: Self-pay | Admitting: Physician Assistant

## 2017-09-04 ENCOUNTER — Ambulatory Visit: Payer: Self-pay | Admitting: Physician Assistant

## 2017-09-04 ENCOUNTER — Ambulatory Visit: Payer: BC Managed Care – PPO | Admitting: Adult Health

## 2017-09-04 ENCOUNTER — Encounter: Payer: Self-pay | Admitting: Adult Health

## 2017-09-04 VITALS — BP 128/82 | HR 78 | Temp 97.5°F | Ht 63.5 in | Wt 185.0 lb

## 2017-09-04 DIAGNOSIS — J00 Acute nasopharyngitis [common cold]: Secondary | ICD-10-CM

## 2017-09-04 MED ORDER — PREDNISONE 20 MG PO TABS: ORAL_TABLET | ORAL | 0 refills | Status: DC

## 2017-09-04 MED ORDER — PROMETHAZINE-DM 6.25-15 MG/5ML PO SYRP: 5.0000 mL | ORAL_SOLUTION | Freq: Four times a day (QID) | ORAL | 1 refills | Status: DC | PRN

## 2017-09-04 MED ORDER — AZITHROMYCIN 250 MG PO TABS: ORAL_TABLET | ORAL | 1 refills | Status: AC

## 2017-09-04 NOTE — Progress Notes (Signed)
Assessment and Plan:  Kimberly Zhang was seen today for uri.  Diagnoses and all orders for this visit:  Acute nasopharyngitis (common cold) Benign exam- Discussed the importance of avoiding unnecessary antibiotic therapy. Suggested symptomatic OTC remedies. Nasal saline spray for congestion. Nasal steroids, allergy pill, oral steroids Follow up as needed. -     predniSONE (DELTASONE) 20 MG tablet; 2 tablets daily for 3 days, 1 tablet daily for 4 days. -     promethazine-dextromethorphan (PROMETHAZINE-DM) 6.25-15 MG/5ML syrup; Take 5 mLs by mouth 4 (four) times daily as needed for cough.  Other orders - to be filled with worsening symptoms, fever -     azithromycin (ZITHROMAX) 250 MG tablet; Take 2 tablets (500 mg) on  Day 1,  followed by 1 tablet (250 mg) once daily on Days 2 through 5.   Further disposition pending results of labs. Discussed med's effects and SE's.   Over 15 minutes of exam, counseling, chart review, and critical decision making was performed.   No future appointments.  ------------------------------------------------------------------------------------------------------------------  HPI BP 128/82   Pulse 78   Temp (!) 97.5 F (36.4 C)   Ht 5' 3.5" (1.613 m)   Wt 185 lb (83.9 kg)   SpO2 98%   BMI 32.26 kg/m   52 y.o.female presents for 4-5 days of mildly productive (clear) cough, nasal congestion, facial pressure, bilateral ear pressure, headaches (improved) - denies fever/chills, vision changes, dizziness, chest pain, dyspnea.   She has taken dayquil/nyquil cold flu over the weekend.   Hx of mild intermittent asthma - rarely has symptoms. Has albuterol if needed, has not needed to use in quite a while. She is on xyzal daily for allergy symptoms.   Past Medical History:  Diagnosis Date  . Allergy   . Anxiety   . Asthma   . Benign hematuria 2011   Ottelin  . Hyperlipidemia   . Migraine   . Vitamin D deficiency      Allergies  Allergen Reactions  .  Shellfish Allergy Anaphylaxis  . Shellfish-Derived Products Anaphylaxis    Current Outpatient Medications on File Prior to Visit  Medication Sig  . albuterol (PROVENTIL HFA) 108 (90 Base) MCG/ACT inhaler 2 puffs.  . ALPRAZolam (XANAX) 0.25 MG tablet TAKE ONE TABLET BY MOUTH TWICE DAILY AS NEEDED FOR ANXIETY  . aspirin-acetaminophen-caffeine (EXCEDRIN MIGRAINE) 250-250-65 MG tablet Take by mouth every 6 (six) hours as needed for headache.  . B Complex Vitamins (VITAMIN-B COMPLEX) TABS Take 1 tablet by mouth.  . Biotin 10 MG CAPS Take by mouth daily.  . cyclobenzaprine (FLEXERIL) 10 MG tablet Take 1 tablet (10 mg total) by mouth at bedtime.  Marland Kitchen levocetirizine (XYZAL) 5 MG tablet Take 1 tablet (5 mg total) by mouth every evening.  . metoCLOPramide (REGLAN) 5 MG tablet Take 1 tablet (5 mg total) by mouth every 8 (eight) hours as needed for nausea (migraines).  . rizatriptan (MAXALT) 10 MG tablet Take 1 tablet (10 mg total) by mouth as needed for migraine. May repeat in 2 hours if needed  . rosuvastatin (CRESTOR) 20 MG tablet TAKE 1 TABLET(20 MG) BY MOUTH DAILY  . SUMAtriptan (IMITREX) 100 MG tablet Take 100 mg by mouth every 2 (two) hours as needed for migraine. May repeat in 2 hours if headache persists or recurs.  . topiramate (TOPAMAX) 25 MG tablet TAKE 1 TABLET(25 MG) BY MOUTH TWICE DAILY  . topiramate (TOPAMAX) 50 MG tablet TAKE 1 TABLET(50 MG) BY MOUTH TWICE DAILY  . verapamil (CALAN-SR) 120  MG CR tablet Take 1 tablet (120 mg total) by mouth at bedtime.   Current Facility-Administered Medications on File Prior to Visit  Medication  . 0.9 %  sodium chloride infusion  . 0.9 %  sodium chloride infusion  . dexamethasone (DECADRON) injection 10 mg    ROS: all negative except above.   Physical Exam:  BP 128/82   Pulse 78   Temp (!) 97.5 F (36.4 C)   Ht 5' 3.5" (1.613 m)   Wt 185 lb (83.9 kg)   SpO2 98%   BMI 32.26 kg/m   General Appearance: Well nourished, in no apparent  distress. Eyes: PERRLA, EOMs, conjunctiva no swelling or erythema Sinuses: No Frontal/maxillary tenderness ENT/Mouth: Ext aud canals clear, TMs without erythema, bulging. No erythema, swelling, or exudate on post pharynx.  Tonsils absent. Hearing normal.  Neck: Supple, thyroid normal.  Respiratory: Respiratory effort normal, BS equal bilaterally without rales, rhonchi, wheezing or stridor.  Cardio: RRR with no MRGs. Brisk peripheral pulses without edema.  Abdomen: Soft, + BS.  Non tender, no guarding, rebound, hernias, masses. Lymphatics: Non tender without lymphadenopathy.  Musculoskeletal: Full ROM, 5/5 strength, normal gait.  Skin: Warm, dry without rashes, lesions, ecchymosis.  Neuro: Cranial nerves intact. Normal muscle tone, no cerebellar symptoms. Psych: Awake and oriented X 3, normal affect, Insight and Judgment appropriate.     Izora Ribas, NP 3:51 PM [facility]

## 2017-09-04 NOTE — Patient Instructions (Signed)
Look into phentermine -      HOW TO TREAT VIRAL COUGH AND COLD SYMPTOMS:  -Symptoms usually last at least 1 week with the worst symptoms being around day 4.  - colds usually start with a sore throat and end with a cough, and the cough can take 2 weeks to get better.  -No antibiotics are needed for colds, flu, sore throats, cough, bronchitis UNLESS symptoms are longer than 7 days OR if you are getting better then get drastically worse.  -There are a lot of combination medications (Dayquil, Nyquil, Vicks 44, tyelnol cold and sinus, ETC). Please look at the ingredients on the back so that you are treating the correct symptoms and not doubling up on medications/ingredients.    Medicines you can use  Nasal congestion  Little Remedies saline spray (aerosol/mist)- can try this, it is in the kids section - pseudoephedrine (Sudafed)- behind the counter, do not use if you have high blood pressure, medicine that have -D in them.  - phenylephrine (Sudafed PE) -Dextormethorphan + chlorpheniramine (Coridcidin HBP)- okay if you have high blood pressure -Oxymetazoline (Afrin) nasal spray- LIMIT to 3 days -Saline nasal spray -Neti pot (used distilled or bottled water)  Ear pain/congestion  -pseudoephedrine (sudafed) - Nasonex/flonase nasal spray  Fever  -Acetaminophen (Tyelnol) -Ibuprofen (Advil, motrin, aleve)  Sore Throat  -Acetaminophen (Tyelnol) -Ibuprofen (Advil, motrin, aleve) -Drink a lot of water -Gargle with salt water - Rest your voice (don't talk) -Throat sprays -Cough drops  Body Aches  -Acetaminophen (Tyelnol) -Ibuprofen (Advil, motrin, aleve)  Headache  -Acetaminophen (Tyelnol) -Ibuprofen (Advil, motrin, aleve) - Exedrin, Exedrin Migraine  Allergy symptoms (cough, sneeze, runny nose, itchy eyes) -Claritin or loratadine cheapest but likely the weakest  -Zyrtec or certizine at night because it can make you sleepy -The strongest is allegra or fexafinadine  Cheapest at  walmart, [employer], [employer]  Cough  -Dextromethorphan (Delsym)- medicine that has DM in it -Guafenesin (Mucinex/Robitussin) - cough drops - drink lots of water  Chest Congestion  -Guafenesin (Mucinex/Robitussin)  Red Itchy Eyes  - Naphcon-A  Upset Stomach  - Bland diet (nothing spicy, greasy, fried, and high acid foods like tomatoes, oranges, berries) -OKAY- cereal, bread, soup, crackers, rice -Eat smaller more frequent meals -reduce caffeine, no alcohol -Loperamide (Imodium-AD) if diarrhea -Prevacid for heart burn  General health when sick  -Hydration -wash your hands frequently -keep surfaces clean -change pillow cases and sheets often -Get fresh air but do not exercise strenuously -Vitamin D, double up on it - Vitamin C -Zinc

## 2017-09-21 NOTE — Progress Notes (Signed)
Assessment and Plan:   Hypertension -Continue medication, monitor blood pressure at home. Continue DASH diet.  Reminder to go to the ER if any CP, SOB, nausea, dizziness, severe HA, changes vision/speech, left arm numbness and tingling and jaw pain.  Cholesterol -Continue diet and exercise. Check cholesterol.    Obesity with co morbidities - long discussion about weight loss, diet, and exercise    Vitamin D Def - check level and continue medications.    Migraines Better controlled, continue the same topamax cut back to 50mg /25 mg  Continue diet and meds as discussed. Further disposition pending results of labs. Over 30 minutes of exam, counseling, chart review, and critical decision making was performed  No future appointments.  HPI 52 y.o. female  presents for 4 month follow up on hypertension, cholesterol, migraines, and vitamin D deficiency.  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 Wants house phone, writes out her messages. Right ear is fine.   Her blood pressure has been controlled at home, today their BP is BP: 124/78  She does not workout. She denies chest pain, shortness of breath, dizziness.  She is on cholesterol medication with crestor 20 mg.   Her cholesterol is not at goal. The cholesterol last visit was:   Lab Results  Component Value Date   CHOL 225 (H) 05/02/2017   HDL 36 (L) 05/02/2017   LDLCALC 145 (H) 10/19/2016   TRIG 330 (H) 05/02/2017   CHOLHDL 6.3 (H) 05/02/2017    Last A1C in the office was:  Lab Results  Component Value Date   HGBA1C 5.3 05/02/2017   She states that her migraines are doing well with the topamax 50 mg BID and verapamil, have migraines every few months. She has had finger and leg tinglin since being on the topamax.   Patient is on Vitamin D supplement, 10,000   Lab Results  Component Value Date   VD25OH 25 (L) 05/02/2017     In addition she has smoking history with hematuria, sent to  urology for referral, with normal evaluation.  BMI is Body mass index is 33.13 kg/m., she is working on diet and exercise. Wt Readings from Last 3 Encounters:  09/22/17 190 lb (86.2 kg)  09/04/17 185 lb (83.9 kg)  05/02/17 184 lb 9.6 oz (83.7 kg)    Current Medications:  Current Outpatient Medications on File Prior to Visit  Medication Sig  . albuterol (PROVENTIL HFA) 108 (90 Base) MCG/ACT inhaler 2 puffs.  . ALPRAZolam (XANAX) 0.25 MG tablet TAKE ONE TABLET BY MOUTH TWICE DAILY AS NEEDED FOR ANXIETY  . aspirin-acetaminophen-caffeine (EXCEDRIN MIGRAINE) 250-250-65 MG tablet Take by mouth every 6 (six) hours as needed for headache.  . B Complex Vitamins (VITAMIN-B COMPLEX) TABS Take 1 tablet by mouth.  . Biotin 10 MG CAPS Take by mouth daily.  . cyclobenzaprine (FLEXERIL) 10 MG tablet Take 1 tablet (10 mg total) by mouth at bedtime.  Marland Kitchen levocetirizine (XYZAL) 5 MG tablet Take 1 tablet (5 mg total) by mouth every evening.  . metoCLOPramide (REGLAN) 5 MG tablet Take 1 tablet (5 mg total) by mouth every 8 (eight) hours as needed for nausea (migraines).  . predniSONE (DELTASONE) 20 MG tablet 2 tablets daily for 3 days, 1 tablet daily for 4 days.  . promethazine-dextromethorphan (PROMETHAZINE-DM) 6.25-15 MG/5ML syrup Take 5 mLs by mouth 4 (four) times daily as needed for cough.  . rizatriptan (MAXALT) 10 MG tablet Take 1 tablet (10 mg total)  by mouth as needed for migraine. May repeat in 2 hours if needed  . rosuvastatin (CRESTOR) 20 MG tablet TAKE 1 TABLET(20 MG) BY MOUTH DAILY  . SUMAtriptan (IMITREX) 100 MG tablet Take 100 mg by mouth every 2 (two) hours as needed for migraine. May repeat in 2 hours if headache persists or recurs.  . topiramate (TOPAMAX) 25 MG tablet TAKE 1 TABLET(25 MG) BY MOUTH TWICE DAILY  . topiramate (TOPAMAX) 50 MG tablet TAKE 1 TABLET(50 MG) BY MOUTH TWICE DAILY  . verapamil (CALAN-SR) 120 MG CR tablet Take 1 tablet (120 mg total) by mouth at bedtime.   Current  Facility-Administered Medications on File Prior to Visit  Medication  . 0.9 %  sodium chloride infusion  . 0.9 %  sodium chloride infusion  . dexamethasone (DECADRON) injection 10 mg    Medical History:  Past Medical History:  Diagnosis Date  . Allergy   . Anxiety   . Asthma   . Benign hematuria 2011   Ottelin  . Hyperlipidemia   . Migraine   . Vitamin D deficiency    Allergies:  Allergies  Allergen Reactions  . Shellfish Allergy Anaphylaxis  . Shellfish-Derived Products Anaphylaxis     Review of Systems:  Review of Systems  Constitutional: Negative.  Negative for chills, fever and malaise/fatigue.  HENT: Negative for congestion, ear discharge, ear pain, hearing loss, nosebleeds, sore throat and tinnitus.   Eyes: Negative for blurred vision, double vision, photophobia, pain, discharge and redness.  Respiratory: Negative.  Negative for shortness of breath and stridor.   Cardiovascular: Negative.  Negative for chest pain.  Gastrointestinal: Positive for constipation. Negative for abdominal pain, blood in stool, diarrhea, heartburn, melena, nausea and vomiting.  Genitourinary: Negative.   Musculoskeletal: Positive for back pain and myalgias. Negative for falls, joint pain and neck pain.  Skin: Negative.   Neurological: Positive for sensory change (intermittent hands and feet at night). Negative for dizziness, tingling, tremors, speech change, focal weakness, seizures, loss of consciousness and headaches.  Psychiatric/Behavioral: Negative.  Negative for suicidal ideas. The patient is not nervous/anxious.   All other systems reviewed and are negative.   Family history- Review and unchanged Social history- Review and unchanged Physical Exam: BP 124/78   Pulse 98   Temp 97.9 F (36.6 C)   Ht 5' 3.5" (1.613 m)   Wt 190 lb (86.2 kg)   SpO2 99%   BMI 33.13 kg/m  Wt Readings from Last 3 Encounters:  09/22/17 190 lb (86.2 kg)  09/04/17 185 lb (83.9 kg)  05/02/17 184 lb  9.6 oz (83.7 kg)   General Appearance: Well nourished, in no apparent distress. Eyes: PERRLA, EOMs, conjunctiva no swelling or erythema,  Right lacrimal tearing.  Sinuses: No Frontal/maxillary tenderness ENT/Mouth: Ext aud canals clear, TMs without erythema, bulging. No erythema, swelling, or exudate on post pharynx. Right gums with mild swelling/erythema and tenderenss to palpation, no palptaed fluctuance. Tonsils not swollen or erythematous. Hearing normal.  Neck: Supple, thyroid normal.  Respiratory: Respiratory effort normal, BS equal bilaterally without rales, rhonchi, wheezing or stridor.  Cardio: RRR with no MRGs. Brisk peripheral pulses without edema.  Abdomen: Soft, + BS,  Non tender, no guarding, rebound, hernias, masses. Lymphatics: Non tender without lymphadenopathy.  Musculoskeletal: Full ROM, 5/5 strength, Normal gait. Skin: Warm, dry without rashes, lesions, ecchymosis.  Neuro: Cranial nerves intact. Normal muscle tone, no cerebellar symptoms. Psych: Awake and oriented X 3, normal affect, Insight and Judgment appropriate.    Vicie Mutters, PA-C  11:40 AM [facility]

## 2017-09-22 ENCOUNTER — Encounter: Payer: Self-pay | Admitting: Physician Assistant

## 2017-09-22 ENCOUNTER — Ambulatory Visit: Payer: Self-pay | Admitting: Physician Assistant

## 2017-09-22 ENCOUNTER — Ambulatory Visit: Payer: BC Managed Care – PPO | Admitting: Physician Assistant

## 2017-09-22 VITALS — BP 124/78 | HR 98 | Temp 97.9°F | Ht 63.5 in | Wt 190.0 lb

## 2017-09-22 DIAGNOSIS — G43809 Other migraine, not intractable, without status migrainosus: Secondary | ICD-10-CM | POA: Diagnosis not present

## 2017-09-22 DIAGNOSIS — Z79899 Other long term (current) drug therapy: Secondary | ICD-10-CM

## 2017-09-22 DIAGNOSIS — E559 Vitamin D deficiency, unspecified: Secondary | ICD-10-CM | POA: Diagnosis not present

## 2017-09-22 DIAGNOSIS — E785 Hyperlipidemia, unspecified: Secondary | ICD-10-CM

## 2017-09-22 NOTE — Patient Instructions (Signed)
Cut back on topamax 50 mg Am and 25 mg at lunch and see if this helps with numbness and tingling  Mini habits for weight loss- get this book  [facility] Telephone [phone]  Intermittent fasting is more about strategy than starvation. It's meant to reset your body in different ways, hopefully with fitness and nutrition changes as a result.  Like any big switchover, though, results may vary when it comes down to the individual level. What works for your friends may not work for you, or vice versa. That's why it's helpful to play around with variations on intermittent fasting and healthy habits and find what works best for you.  WHAT IS INTERMITTENT FASTING AND WHY DO IT?  Intermittent fasting doesn't involve specific foods, but rather, a strict schedule regarding when you eat. Also called "time-restricted eating," the tactic has been praised for its contribution to weight loss, improved body composition, and decreased cravings. Preliminary research also suggests it may be beneficial for glucose tolerance, hormone regulation, better muscle mass and lower body fat.  Part of its appeal is the simplicity of the effort. Unlike some other trends, there's no calculations to intermittent fasting.  You simply eat within a certain block of time, usually a window of 8-10 hours. In the other big block of time - about 14-16 hours, including when you're asleep - you don't eat anything, not even snacks. You can drink water, coffee, tea or any other beverage that doesn't have calories.  For example, if you like having a late dinner, you might skip breakfast and have your first meal at noon and your last meal of the day at 8 p.m., and then not eat until noon again the next day.  IDEAS FOR GETTING STARTED  If you're new to the strategy, it may be helpful to eat within the typical circadian rhythm and keep eating within daylight hours. This can be especially beneficial if  you're looking at intermittent fasting for weight-loss goals.  So first try only eating between 12pm to 8pm.  Outside of this time you may have water, black coffee, and hot tea. You may not eat it drink anything that has carbs, sugars, OR artificial sugars like diet soda.   Like any major eating and fitness shift, it can take time to find the perfect fit, so don't be afraid to experiment with different options - including ditching intermittent fasting altogether if it's simply not for you. But if it is, you may be surprised by some of the benefits that come along with the strategy.   Are you an emotional eater? Do you eat more when you're feeling stressed? Do you eat when you're not hungry or when you're full? Do you eat to feel better (to calm and soothe yourself when you're sad, mad, bored, anxious, etc.)? Do you reward yourself with food? Do you regularly eat until you've stuffed yourself? Does food make you feel safe? Do you feel like food is a friend? Do you feel powerless or out of control around food?  If you answered yes to some of these questions than it is likely that you are an emotional eater. This is normally a learned behavior and can take time to first recognize the signs and second BREAK THE HABIT. But here is more information and tips to help.   The difference between emotional hunger and physical hunger Emotional hunger can be powerful, so it's easy to mistake it for physical hunger. But there  are clues you can look for to help you tell physical and emotional hunger apart.  Emotional hunger comes on suddenly. It hits you in an instant and feels overwhelming and urgent. Physical hunger, on the other hand, comes on more gradually. The urge to eat doesn't feel as dire or demand instant satisfaction (unless you haven't eaten for a very long time).  Emotional hunger craves specific comfort foods. When you're physically hungry, almost anything sounds good-including healthy stuff  like vegetables. But emotional hunger craves junk food or sugary snacks that provide an instant rush. You feel like you need cheesecake or pizza, and nothing else will do.  Emotional hunger often leads to mindless eating. Before you know it, you've eaten a whole bag of chips or an entire pint of ice cream without really paying attention or fully enjoying it. When you're eating in response to physical hunger, you're typically more aware of what you're doing.  Emotional hunger isn't satisfied once you're full. You keep wanting more and more, often eating until you're uncomfortably stuffed. Physical hunger, on the other hand, doesn't need to be stuffed. You feel satisfied when your stomach is full.  Emotional hunger isn't located in the stomach. Rather than a growling belly or a pang in your stomach, you feel your hunger as a craving you can't get out of your head. You're focused on specific textures, tastes, and smells.  Emotional hunger often leads to regret, guilt, or shame. When you eat to satisfy physical hunger, you're unlikely to feel guilty or ashamed because you're simply giving your body what it needs. If you feel guilty after you eat, it's likely because you know deep down that you're not eating for nutritional reasons.  Identify your emotional eating triggers What situations, places, or feelings make you reach for the comfort of food? Most emotional eating is linked to unpleasant feelings, but it can also be triggered by positive emotions, such as rewarding yourself for achieving a goal or celebrating a holiday or happy event. Common causes of emotional eating include:  Stuffing emotions - Eating can be a way to temporarily silence or "stuff down" uncomfortable emotions, including anger, fear, sadness, anxiety, loneliness, resentment, and shame. While you're numbing yourself with food, you can avoid the difficult emotions you'd rather not feel.  Boredom or feelings of emptiness - Do you ever  eat simply to give yourself something to do, to relieve boredom, or as a way to fill a void in your life? You feel unfulfilled and empty, and food is a way to occupy your mouth and your time. In the moment, it fills you up and distracts you from underlying feelings of purposelessness and dissatisfaction with your life.  Childhood habits - Think back to your childhood memories of food. Did your parents reward good behavior with ice cream, take you out for pizza when you got a good report card, or serve you sweets when you were feeling sad? These habits can often carry over into adulthood. Or your eating may be driven by nostalgia-for cherished memories of grilling burgers in the backyard with your dad or baking and eating cookies with your mom.  Social influences - Getting together with other people for a meal is a great way to relieve stress, but it can also lead to overeating. It's easy to overindulge simply because the food is there or because everyone else is eating. You may also overeat in social situations out of nervousness. Or perhaps your family or circle of friends  encourages you to overeat, and it's easier to go along with the group.  Stress - Ever notice how stress makes you hungry? It's not just in your mind. When stress is chronic, as it so often is in our chaotic, fast-paced world, your body produces high levels of the stress hormone, cortisol. Cortisol triggers cravings for salty, sweet, and fried foods-foods that give you a burst of energy and pleasure. The more uncontrolled stress in your life, the more likely you are to turn to food for emotional relief.  Find other ways to feed your feelings If you don't know how to manage your emotions in a way that doesn't involve food, you won't be able to control your eating habits for very long. Diets so often fail because they offer logical nutritional advice which only works if you have conscious control over your eating habits. It doesn't work  when emotions hijack the process, demanding an immediate payoff with food.  In order to stop emotional eating, you have to find other ways to fulfill yourself emotionally. It's not enough to understand the cycle of emotional eating or even to understand your triggers, although that's a huge first step. You need alternatives to food that you can turn to for emotional fulfillment.  Alternatives to emotional eating If you're depressed or lonely, call someone who always makes you feel better, play with your dog or cat, or look at a favorite photo or cherished memento.  If you're anxious, expend your nervous energy by dancing to your favorite song, squeezing a stress ball, or taking a brisk walk.  If you're exhausted, treat yourself with a hot cup of tea, take a bath, light some scented candles, or wrap yourself in a warm blanket.  If you're bored, read a good book, watch a comedy show, explore the outdoors, or turn to an activity you enjoy (woodworking, playing the guitar, shooting hoops, scrapbooking, etc.).  What is mindful eating? Mindful eating is a practice that develops your awareness of eating habits and allows you to pause between your triggers and your actions. Most emotional eaters feel powerless over their food cravings. When the urge to eat hits, you feel an almost unbearable tension that demands to be fed, right now. Because you've tried to resist in the past and failed, you believe that your willpower just isn't up to snuff. But the truth is that you have more power over your cravings than you think.  Take 5 before you give in to a craving Emotional eating tends to be automatic and virtually mindless. Before you even realize what you're doing, you've reached for a tub of ice cream and polished off half of it. But if you can take a moment to pause and reflect when you're hit with a craving, you give yourself the opportunity to make a different decision.  Can you put off eating for five  minutes? Or just start with one minute. Don't tell yourself you can't give in to the craving; remember, the forbidden is extremely tempting. Just tell yourself to wait.  While you're waiting, check in with yourself. How are you feeling? What's going on emotionally? Even if you end up eating, you'll have a better understanding of why you did it. This can help you set yourself up for a different response next time.  How to practice mindful eating Eating while you're also doing other things-such as watching TV, driving, or playing with your phone-can prevent you from fully enjoying your food. Since your mind is  elsewhere, you may not feel satisfied or continue eating even though you're no longer hungry. Eating more mindfully can help focus your mind on your food and the pleasure of a meal and curb overeating.   Eat your meals in a calm place with no distractions, aside from any dining companions.  Try eating with your non-dominant hand or using chopsticks instead of a knife and fork. Eating in such a non-familiar way can slow down how fast you eat and ensure your mind stays focused on your food.  Allow yourself enough time not to have to rush your meal. Set a timer for 20 minutes and pace yourself so you spend at least that much time eating.  Take small bites and chew them well, taking time to notice the different flavors and textures of each mouthful.  Put your utensils down between bites. Take time to consider how you feel-hungry, satiated-before picking up your utensils again.  Try to stop eating before you are full.It takes time for the signal to reach your brain that you've had enough. Don't feel obligated to always clean your plate.  When you've finished your food, take a few moments to assess if you're really still hungry before opting for an extra serving or dessert.  Learn to accept your feelings-even the bad ones  While it may seem that the core problem is that you're powerless over  food, emotional eating actually stems from feeling powerless over your emotions. You don't feel capable of dealing with your feelings head on, so you avoid them with food.  Recommended reading  Healthy Eating: A guide to the new nutrition - [place] Report  10 Tips for Mindful Eating - How mindfulness can help you fully enjoy a meal and the experience of eating-with moderation and restraint. ([place])  Weight Loss: Gain Control of Emotional Eating - Tips to regain control of your eating habits. [facility])  Why Stress Causes People to Overeat -Tips on controlling stress eating. ([place])  Mindful Eating Meditations -Free online mindfulness meditations. ([facility] [employer])    Veggies are great because you can eat a ton! They are low in calories, great to fill you up, and have a ton of vitamins, minerals, and protein.

## 2017-09-23 LAB — BASIC METABOLIC PANEL WITH GFR
BUN: 10 mg/dL (ref 7–25)
CHLORIDE: 109 mmol/L (ref 98–110)
CO2: 25 mmol/L (ref 20–32)
Calcium: 9.4 mg/dL (ref 8.6–10.4)
Creat: 0.99 mg/dL (ref 0.50–1.05)
GFR, Est African American: 76 mL/min/{1.73_m2} (ref >=60)
GFR, Est Non African American: 66 mL/min/{1.73_m2} (ref >=60)
GLUCOSE: 90 mg/dL (ref 65–99)
POTASSIUM: 3.9 mmol/L (ref 3.5–5.3)
SODIUM: 141 mmol/L (ref 135–146)

## 2017-09-23 LAB — CBC WITH DIFFERENTIAL/PLATELET
Basophils Absolute: 61 cells/uL (ref 0–200)
Basophils Relative: 0.7 %
EOS ABS: 200 {cells}/uL (ref 15–500)
Eosinophils Relative: 2.3 %
HCT: 43 % (ref 35.0–45.0)
Hemoglobin: 14.8 g/dL (ref 11.7–15.5)
Lymphs Abs: 3706 cells/uL (ref 850–3900)
MCH: 28.9 pg (ref 27.0–33.0)
MCHC: 34.4 g/dL (ref 32.0–36.0)
MCV: 84 fL (ref 80.0–100.0)
MONOS PCT: 8.1 %
MPV: 9.5 fL (ref 7.5–12.5)
Neutro Abs: 4028 cells/uL (ref 1500–7800)
Neutrophils Relative %: 46.3 %
PLATELETS: 266 10*3/uL (ref 140–400)
RBC: 5.12 10*6/uL — AB (ref 3.80–5.10)
RDW: 12.5 % (ref 11.0–15.0)
TOTAL LYMPHOCYTE: 42.6 %
WBC mixed population: 705 cells/uL (ref 200–950)
WBC: 8.7 10*3/uL (ref 3.8–10.8)

## 2017-09-23 LAB — HEPATIC FUNCTION PANEL
AG Ratio: 2 (calc) (ref 1.0–2.5)
ALKALINE PHOSPHATASE (APISO): 92 U/L (ref 33–130)
ALT: 24 U/L (ref 6–29)
AST: 13 U/L (ref 10–35)
Albumin: 4.1 g/dL (ref 3.6–5.1)
BILIRUBIN INDIRECT: 0.3 mg/dL (ref 0.2–1.2)
Bilirubin, Direct: 0.1 mg/dL (ref 0.0–0.2)
Globulin: 2.1 g/dL (calc) (ref 1.9–3.7)
TOTAL PROTEIN: 6.2 g/dL (ref 6.1–8.1)
Total Bilirubin: 0.4 mg/dL (ref 0.2–1.2)

## 2017-09-23 LAB — LIPID PANEL
CHOLESTEROL: 235 mg/dL — AB (ref <200)
HDL: 33 mg/dL — ABNORMAL LOW (ref >50)
LDL Cholesterol (Calc): 158 mg/dL (calc) — ABNORMAL HIGH
Non-HDL Cholesterol (Calc): 202 mg/dL (calc) — ABNORMAL HIGH (ref <130)
Total CHOL/HDL Ratio: 7.1 (calc) — ABNORMAL HIGH (ref <5.0)
Triglycerides: 267 mg/dL — ABNORMAL HIGH (ref <150)

## 2017-09-23 LAB — VITAMIN D 25 HYDROXY (VIT D DEFICIENCY, FRACTURES): VIT D 25 HYDROXY: 32 ng/mL (ref 30–100)

## 2017-09-23 LAB — MAGNESIUM: MAGNESIUM: 1.9 mg/dL (ref 1.5–2.5)

## 2017-09-23 LAB — TSH: TSH: 0.52 m[IU]/L

## 2017-10-30 ENCOUNTER — Ambulatory Visit: Payer: BC Managed Care – PPO | Admitting: Adult Health

## 2017-10-30 ENCOUNTER — Encounter: Payer: Self-pay | Admitting: Adult Health

## 2017-10-30 VITALS — BP 108/74 | HR 80 | Temp 97.5°F | Ht 63.5 in | Wt 185.0 lb

## 2017-10-30 DIAGNOSIS — J301 Allergic rhinitis due to pollen: Secondary | ICD-10-CM

## 2017-10-30 DIAGNOSIS — G43809 Other migraine, not intractable, without status migrainosus: Secondary | ICD-10-CM | POA: Diagnosis not present

## 2017-10-30 DIAGNOSIS — J0100 Acute maxillary sinusitis, unspecified: Secondary | ICD-10-CM

## 2017-10-30 MED ORDER — PROMETHAZINE-DM 6.25-15 MG/5ML PO SYRP: 5.0000 mL | ORAL_SOLUTION | Freq: Four times a day (QID) | ORAL | 1 refills | Status: DC | PRN

## 2017-10-30 MED ORDER — AZITHROMYCIN 250 MG PO TABS: ORAL_TABLET | ORAL | 1 refills | Status: AC

## 2017-10-30 MED ORDER — PREDNISONE 20 MG PO TABS: ORAL_TABLET | ORAL | 0 refills | Status: DC

## 2017-10-30 MED ORDER — CYCLOBENZAPRINE HCL 10 MG PO TABS: 10.0000 mg | ORAL_TABLET | Freq: Every day | ORAL | 0 refills | Status: DC

## 2017-10-30 NOTE — Progress Notes (Signed)
Assessment and Plan:  Acute non-recurrent maxillary sinusitis/ Seasonal allergic rhinitis due to pollen Patient is very difficult to interview; exam unremarkable other than sinus pressure/tenderness today - simple sinus pressure vs alternate etiology requiring workup (such as autoimmune/sjrogen's, intracranial etiology). After discussion, will defer imaging, etc and will proceed with treatment for sinuses and have her continue to monitor symptoms closely, keep ophthalmology appointment and follow up if symptoms not improving in the next 2-3 days to proceed with further workup. ER precautions given for new concerning symptoms.  Suggested symptomatic OTC remedies - mucinex Nasal saline spray for congestion. Nasal steroids, allergy pill, oral steroids -     azithromycin (ZITHROMAX) 250 MG tablet; Take 2 tablets (500 mg) on  Day 1,  followed by 1 tablet (250 mg) once daily on Days 2 through 5. -     predniSONE (DELTASONE) 20 MG tablet; 2 tablets daily for 3 days, 1 tablet daily for 4 days. -     promethazine-dextromethorphan (PROMETHAZINE-DM) 6.25-15 MG/5ML syrup; Take 5 mLs by mouth 4 (four) times daily as needed for cough.  Further disposition pending results of labs. Discussed med's effects and SE's.   Over 15 minutes of exam, counseling, chart review, and critical decision making was performed.   Future Appointments  Date Time Provider [place]  12/29/2017  9:00 AM Liane Comber, NP [facility] None    ------------------------------------------------------------------------------------------------------------------  HPI BP 108/74   Pulse 80   Temp (!) 97.5 F (36.4 C)   Ht 5' 3.5" (1.613 m)   Wt 185 lb (83.9 kg)   SpO2 98%   BMI 32.26 kg/m   52 y.o.AA female with hx of seasonal allergies, migraine and anxiety presents for evaluation reporting for 1-2 weeks she has been "fighting something" - reports has had lingering headache progressive through the day (has hx of migraines,  states these are different), varying from 5-10/10, aching/sinus pressure, does endorse blurry vision at this time, she has had a mild fever at home with mild chills. "I think it's sinus pain." She endorses mild dry cough and sneezing earlier this past week, as well as watery eyes. She wears glasses and has an appointment with ophthalmology for eyes upcoming on 4/24.   Has been drinking lots of water and gatoraide - has felt like she has had progressively worse dry mouth and fatigue over the last week. She does endorse some sweating at night, but reports this has been ongoing for some time. She believes this is related to menopausal symptoms. Her weights are stable.   She does have hx of allergies and is prescribed xyzal; she reports she has been out of this medication and has not been taking anything for allergies in the past weeks despite current allergy season.   Has hx of migraine; last was 2 weeks ago - she reports these HA are significantly different from current and typically responds very well to abortive agents.    Past Medical History:  Diagnosis Date  . Allergy   . Anxiety   . Asthma   . Benign hematuria 2011   [place]  . Hyperlipidemia   . Migraine   . Vitamin D deficiency      Allergies  Allergen Reactions  . Shellfish Allergy Anaphylaxis  . Shellfish-Derived Products Anaphylaxis    Current Outpatient Medications on File Prior to Visit  Medication Sig  . aspirin-acetaminophen-caffeine (EXCEDRIN MIGRAINE) 250-250-65 MG tablet Take by mouth every 6 (six) hours as needed for headache.  . B Complex Vitamins (VITAMIN-B COMPLEX)  TABS Take 1 tablet by mouth.  . Biotin 10 MG CAPS Take by mouth daily.  . cyclobenzaprine (FLEXERIL) 10 MG tablet Take 1 tablet (10 mg total) by mouth at bedtime.  Marland Kitchen levocetirizine (XYZAL) 5 MG tablet Take 1 tablet (5 mg total) by mouth every evening.  . promethazine-dextromethorphan (PROMETHAZINE-DM) 6.25-15 MG/5ML syrup Take 5 mLs by mouth 4 (four)  times daily as needed for cough.  . rosuvastatin (CRESTOR) 20 MG tablet TAKE 1 TABLET(20 MG) BY MOUTH DAILY  . SUMAtriptan (IMITREX) 100 MG tablet Take 100 mg by mouth every 2 (two) hours as needed for migraine. May repeat in 2 hours if headache persists or recurs.  . topiramate (TOPAMAX) 25 MG tablet TAKE 1 TABLET(25 MG) BY MOUTH TWICE DAILY  . topiramate (TOPAMAX) 50 MG tablet TAKE 1 TABLET(50 MG) BY MOUTH TWICE DAILY  . verapamil (CALAN-SR) 120 MG CR tablet Take 1 tablet (120 mg total) by mouth at bedtime.  Marland Kitchen albuterol (PROVENTIL HFA) 108 (90 Base) MCG/ACT inhaler 2 puffs.  . ALPRAZolam (XANAX) 0.25 MG tablet TAKE ONE TABLET BY MOUTH TWICE DAILY AS NEEDED FOR ANXIETY (Patient not taking: Reported on 10/30/2017)  . metoCLOPramide (REGLAN) 5 MG tablet Take 1 tablet (5 mg total) by mouth every 8 (eight) hours as needed for nausea (migraines). (Patient not taking: Reported on 10/30/2017)  . predniSONE (DELTASONE) 20 MG tablet 2 tablets daily for 3 days, 1 tablet daily for 4 days. (Patient not taking: Reported on 10/30/2017)  . rizatriptan (MAXALT) 10 MG tablet Take 1 tablet (10 mg total) by mouth as needed for migraine. May repeat in 2 hours if needed (Patient not taking: Reported on 10/30/2017)   Current Facility-Administered Medications on File Prior to Visit  Medication  . 0.9 %  sodium chloride infusion  . 0.9 %  sodium chloride infusion  . dexamethasone (DECADRON) injection 10 mg    ROS: Review of Systems  Constitutional: Positive for chills, diaphoresis (Mild, at night, ongoing), fever and malaise/fatigue. Negative for weight loss.  HENT: Positive for congestion and sinus pain. Negative for ear discharge, ear pain, hearing loss, sore throat and tinnitus.   Eyes: Positive for blurred vision. Negative for double vision, photophobia, pain, discharge and redness.  Respiratory: Positive for cough. Negative for hemoptysis, sputum production, shortness of breath and wheezing.   Cardiovascular: Negative  for chest pain, palpitations, orthopnea, claudication, leg swelling and PND.  Gastrointestinal: Negative for abdominal pain, blood in stool, constipation, diarrhea, heartburn, melena, nausea and vomiting.  Genitourinary: Negative.   Musculoskeletal: Negative for back pain, joint pain, myalgias and neck pain.  Skin: Negative for rash.  Neurological: Positive for headaches ("achy" "pressure" ). Negative for dizziness, tingling, tremors, sensory change, speech change, focal weakness, seizures, loss of consciousness and weakness.  Endo/Heme/Allergies: Positive for environmental allergies. Negative for polydipsia. Does not bruise/bleed easily.     Physical Exam:  BP 108/74   Pulse 80   Temp (!) 97.5 F (36.4 C)   Ht 5' 3.5" (1.613 m)   Wt 185 lb (83.9 kg)   SpO2 98%   BMI 32.26 kg/m   General Appearance: Well nourished, in no apparent distress. Eyes: PERRLA, EOMs, conjunctiva no swelling or erythema, fundoscopic exam unremarkable in office today without bulging of optic disc.  Sinuses: Has left maxillary sinus tenderness ENT/Mouth: Ext aud canals clear, TMs without erythema, bulging. No erythema, swelling, or exudate on post pharynx.  Tonsils not swollen or erythematous. Hearing normal.  Neck: Supple, thyroid normal.  Respiratory: Respiratory effort  normal, BS equal bilaterally without rales, rhonchi, wheezing or stridor.  Cardio: RRR with no MRGs. Brisk peripheral pulses without edema.  Abdomen: Soft, + BS.  Non tender, no guarding, rebound, hernias, masses. Lymphatics: Non tender without lymphadenopathy.  Musculoskeletal: Full ROM, 5/5 strength, normal gait.  Skin: Warm, dry without rashes, lesions, ecchymosis.  Neuro: Cranial nerves intact. Normal muscle tone, no cerebellar symptoms. Sensation intact.  Psych: Awake and oriented X 3, normal affect, Insight and Judgment appropriate.     Izora Ribas, NP 10:31 AM [name]

## 2017-10-30 NOTE — Patient Instructions (Signed)
Do flonase, nasal saline irrigations   Let me know if blurry vision/headache not improving -     HOW TO TREAT VIRAL COUGH AND COLD SYMPTOMS:  -Symptoms usually last at least 1 week with the worst symptoms being around day 4.  - colds usually start with a sore throat and end with a cough, and the cough can take 2 weeks to get better.  -No antibiotics are needed for colds, flu, sore throats, cough, bronchitis UNLESS symptoms are longer than 7 days OR if you are getting better then get drastically worse.  -There are a lot of combination medications (Dayquil, Nyquil, Vicks 44, tyelnol cold and sinus, ETC). Please look at the ingredients on the back so that you are treating the correct symptoms and not doubling up on medications/ingredients.    Medicines you can use  Nasal congestion  Little Remedies saline spray (aerosol/mist)- can try this, it is in the kids section - pseudoephedrine (Sudafed)- behind the counter, do not use if you have high blood pressure, medicine that have -D in them.  - phenylephrine (Sudafed PE) -Dextormethorphan + chlorpheniramine (Coridcidin HBP)- okay if you have high blood pressure -Oxymetazoline (Afrin) nasal spray- LIMIT to 3 days -Saline nasal spray -Neti pot (used distilled or bottled water)  Ear pain/congestion  -pseudoephedrine (sudafed) - Nasonex/flonase nasal spray  Fever  -Acetaminophen (Tyelnol) -Ibuprofen (Advil, motrin, aleve)  Sore Throat  -Acetaminophen (Tyelnol) -Ibuprofen (Advil, motrin, aleve) -Drink a lot of water -Gargle with salt water - Rest your voice (don't talk) -Throat sprays -Cough drops  Body Aches  -Acetaminophen (Tyelnol) -Ibuprofen (Advil, motrin, aleve)  Headache  -Acetaminophen (Tyelnol) -Ibuprofen (Advil, motrin, aleve) - Exedrin, Exedrin Migraine  Allergy symptoms (cough, sneeze, runny nose, itchy eyes) -Claritin or loratadine cheapest but likely the weakest  -Zyrtec or certizine at night because it can make  you sleepy -The strongest is allegra or fexafinadine  Cheapest at [employer], [employer], [employer]  Cough  -Dextromethorphan (Delsym)- medicine that has DM in it -Guafenesin (Mucinex/Robitussin) - cough drops - drink lots of water  Chest Congestion  -Guafenesin (Mucinex/Robitussin)  Red Itchy Eyes  - Naphcon-A  Upset Stomach  - Bland diet (nothing spicy, greasy, fried, and high acid foods like tomatoes, oranges, berries) -OKAY- cereal, bread, soup, crackers, rice -Eat smaller more frequent meals -reduce caffeine, no alcohol -Loperamide (Imodium-AD) if diarrhea -Prevacid for heart burn  General health when sick  -Hydration -wash your hands frequently -keep surfaces clean -change pillow cases and sheets often -Get fresh air but do not exercise strenuously -Vitamin D, double up on it - Vitamin C -Zinc

## 2017-11-27 ENCOUNTER — Other Ambulatory Visit: Payer: Self-pay | Admitting: Internal Medicine

## 2017-12-08 ENCOUNTER — Other Ambulatory Visit: Payer: Self-pay | Admitting: Physician Assistant

## 2017-12-08 DIAGNOSIS — G43809 Other migraine, not intractable, without status migrainosus: Secondary | ICD-10-CM

## 2017-12-28 NOTE — Progress Notes (Signed)
FOLLOW UP  Assessment and Plan:   Cholesterol Currently above goal; medication adherence; titrate crestor up vs add zetia if remains elevated - will trial stopping crestor x 2 weeks for possible myalgias and add zetia Continue low cholesterol diet and exercise.  Check lipid panel.   Obesity with co morbidities Long discussion about weight loss, diet, and exercise Recommended diet heavy in fruits and veggies and low in animal meats, cheeses, and dairy products, appropriate calorie intake Discussed ideal weight for height Patient will work on increasing walking, water intake, portions Will follow up in 3 months  Vitamin D Def Below goal at last visit; not currently on supplement; start 5000 IU daily Defer Vit D level  Continue diet and meds as discussed. Further disposition pending results of labs. Discussed med's effects and SE's.   Over 30 minutes of exam, counseling, chart review, and critical decision making was performed.   No future appointments.  ----------------------------------------------------------------------------------------------------------------------  HPI 52 y.o. female  presents for 3 month follow up on cholesterol, obesity and vitamin D deficiency. She has mild asthma well controlled by daily allergy medications and PRN inhaler. She states that her migraines are doing well with the topamax 50 mg AM, 25 mg PM and verapamil, have migraines every few months. She has had finger and leg tingling since being on the topamax, somewhat improved with dose reduction.   she has a diagnosis of anxiety and is currently on xanax 0.25 mg BID PRN, reports symptoms are well controlled on current regimen. she takes very rarely and doing well.   BMI is Body mass index is 32.95 kg/m., she has been working on diet. Wt Readings from Last 3 Encounters:  12/29/17 189 lb (85.7 kg)  10/30/17 185 lb (83.9 kg)  09/22/17 190 lb (86.2 kg)   Her blood pressure has been controlled at  home, today their BP is BP: 110/72  She does not workout. She denies chest pain, shortness of breath, dizziness.   She is on cholesterol medication (crestor 20 mg daily, admits to forgetting sometimes) and denies myalgias. Her cholesterol is not at goal. The cholesterol last visit was:   Lab Results  Component Value Date   CHOL 235 (H) 09/22/2017   HDL 33 (L) 09/22/2017   LDLCALC 158 (H) 09/22/2017   TRIG 267 (H) 09/22/2017   CHOLHDL 7.1 (H) 09/22/2017    She has been working on diet and exercise for glucose management, and denies foot ulcerations, increased appetite, nausea, paresthesia of the feet, polydipsia, polyuria, visual disturbances, vomiting and weight loss. Last A1C in the office was:  Lab Results  Component Value Date   HGBA1C 5.3 05/02/2017   Patient is on Vitamin D supplement.   Lab Results  Component Value Date   VD25OH 32 09/22/2017        Current Medications:  Current Outpatient Medications on File Prior to Visit  Medication Sig  . aspirin-acetaminophen-caffeine (EXCEDRIN MIGRAINE) 250-250-65 MG tablet Take by mouth every 6 (six) hours as needed for headache.  . B Complex Vitamins (VITAMIN-B COMPLEX) TABS Take 1 tablet by mouth.  . Biotin 10 MG CAPS Take by mouth daily.  . cyclobenzaprine (FLEXERIL) 10 MG tablet Take 1 tablet (10 mg total) by mouth at bedtime.  Marland Kitchen DICLOFENAC SODIUM PO Take 75 mg by mouth.  . levocetirizine (XYZAL) 5 MG tablet Take 1 tablet (5 mg total) by mouth every evening.  . rizatriptan (MAXALT) 10 MG tablet Take 1 tablet (10 mg total) by mouth as needed  for migraine. May repeat in 2 hours if needed  . rosuvastatin (CRESTOR) 20 MG tablet TAKE 1 TABLET(20 MG) BY MOUTH DAILY  . SUMAtriptan (IMITREX) 100 MG tablet Take 100 mg by mouth every 2 (two) hours as needed for migraine. May repeat in 2 hours if headache persists or recurs.  . topiramate (TOPAMAX) 25 MG tablet TAKE 1 TABLET(25 MG) BY MOUTH TWICE DAILY  . verapamil (CALAN-SR) 120 MG CR  tablet TAKE 1 TABLET BY MOUTH EVERY NIGHT AT BEDTIME  . albuterol (PROVENTIL HFA) 108 (90 Base) MCG/ACT inhaler 2 puffs.   No current facility-administered medications on file prior to visit.      Allergies:  Allergies  Allergen Reactions  . Shellfish Allergy Anaphylaxis  . Shellfish-Derived Products Anaphylaxis     Medical History:  Past Medical History:  Diagnosis Date  . Allergy   . Anxiety   . Asthma   . Benign hematuria 2011   [place]  . Hyperlipidemia   . Migraine   . Vitamin D deficiency    Family history- Reviewed and unchanged Social history- Reviewed and unchanged   Review of Systems:  Review of Systems  Constitutional: Negative for malaise/fatigue and weight loss.  HENT: Negative for hearing loss and tinnitus.   Eyes: Negative for blurred vision and double vision.  Respiratory: Negative for cough, shortness of breath and wheezing.   Cardiovascular: Negative for chest pain, palpitations, orthopnea, claudication and leg swelling.  Gastrointestinal: Negative for abdominal pain, blood in stool, constipation, diarrhea, heartburn, melena, nausea and vomiting.  Genitourinary: Negative.   Musculoskeletal: Negative for joint pain and myalgias.  Skin: Negative for rash.  Neurological: Negative for dizziness, tingling, sensory change, weakness and headaches.  Endo/Heme/Allergies: Negative for polydipsia.  Psychiatric/Behavioral: Negative.   All other systems reviewed and are negative.   Physical Exam: BP 110/72   Pulse 67   Temp 97.7 F (36.5 C)   Ht 5' 3.5" (1.613 m)   Wt 189 lb (85.7 kg)   SpO2 98%   BMI 32.95 kg/m  Wt Readings from Last 3 Encounters:  12/29/17 189 lb (85.7 kg)  10/30/17 185 lb (83.9 kg)  09/22/17 190 lb (86.2 kg)   General Appearance: Well nourished, in no apparent distress. Eyes: PERRLA, EOMs, conjunctiva no swelling or erythema Sinuses: No Frontal/maxillary tenderness ENT/Mouth: Ext aud canals clear, TMs without erythema, bulging.  No erythema, swelling, or exudate on post pharynx.  Tonsils not swollen or erythematous. Hearing normal.  Neck: Supple, thyroid normal.  Respiratory: Respiratory effort normal, BS equal bilaterally without rales, rhonchi, wheezing or stridor.  Cardio: RRR with no MRGs. Brisk peripheral pulses without edema.  Abdomen: Soft, + BS.  Non tender, no guarding, rebound, hernias, masses. Lymphatics: Non tender without lymphadenopathy.  Musculoskeletal: Full ROM, 5/5 strength, Normal gait Skin: Warm, dry without rashes, lesions, ecchymosis.  Neuro: Cranial nerves intact. No cerebellar symptoms.  Psych: Awake and oriented X 3, normal affect, Insight and Judgment appropriate.    Izora Ribas, NP 9:14 AM [facility]

## 2017-12-29 ENCOUNTER — Ambulatory Visit: Payer: BC Managed Care – PPO | Admitting: Adult Health

## 2017-12-29 ENCOUNTER — Encounter: Payer: Self-pay | Admitting: Adult Health

## 2017-12-29 VITALS — BP 110/72 | HR 67 | Temp 97.7°F | Ht 63.5 in | Wt 189.0 lb

## 2017-12-29 DIAGNOSIS — E785 Hyperlipidemia, unspecified: Secondary | ICD-10-CM

## 2017-12-29 DIAGNOSIS — Z6832 Body mass index (BMI) 32.0-32.9, adult: Secondary | ICD-10-CM | POA: Diagnosis not present

## 2017-12-29 DIAGNOSIS — G43809 Other migraine, not intractable, without status migrainosus: Secondary | ICD-10-CM | POA: Diagnosis not present

## 2017-12-29 DIAGNOSIS — E6609 Other obesity due to excess calories: Secondary | ICD-10-CM | POA: Diagnosis not present

## 2017-12-29 DIAGNOSIS — E559 Vitamin D deficiency, unspecified: Secondary | ICD-10-CM | POA: Diagnosis not present

## 2017-12-29 DIAGNOSIS — F419 Anxiety disorder, unspecified: Secondary | ICD-10-CM

## 2017-12-29 DIAGNOSIS — Z79899 Other long term (current) drug therapy: Secondary | ICD-10-CM

## 2017-12-29 MED ORDER — ALPRAZOLAM 0.25 MG PO TABS: 0.2500 mg | ORAL_TABLET | Freq: Two times a day (BID) | ORAL | 0 refills | Status: DC | PRN

## 2017-12-29 MED ORDER — BUPROPION HCL ER (SR) 150 MG PO TB12: 150.0000 mg | ORAL_TABLET | Freq: Every day | ORAL | 1 refills | Status: DC

## 2017-12-29 NOTE — Patient Instructions (Addendum)
Please add 5000 IU of vitamin D daily   Try stopping crestor x 2 weeks then restart - see how muscle aches do   These are weight loss tips that helped me lose 30 lb after the freshman 15- which was 30 for me..   Drink 1/2 your body weight in fluid ounces of water daily; drink a tall glass of water 30 min before meals  Don't eat until you're stuffed- listen to your stomach and eat until you are 80% full   Try eating off of a salad plate; wait 10 min after finishing before going back for seconds  Start by eating the vegetables on your plate; aim for 50% of your meals to be fruits or vegetables  Then eat your protein - lean meats (grass fed if possible), fish, beans, nuts in moderation  Eat your carbs/starch last ONLY if you still are hungry. If you can, stop before finishing it all  Avoid sugar and flour - the closer it looks to it's original form in nature, typically the better it is for you  Splurge in moderation - "assign" days when you get to splurge and have the "bad stuff" - I like to follow a 80% - 20% plan- "good" choices 80 % of the time, "bad" choices in moderation 20% of the time  Simple equation is: Calories out > calories in = weight loss - even if you eat the bad stuff, if you limit portions, you will still lose weight      When it comes to diets, agreement about the perfect plan isn't easy to find, even among the experts. Experts at the [employer] developed an idea known as the Healthy Eating Plate. Just imagine a plate divided into logical, healthy portions.  The emphasis is on diet quality:  Load up on vegetables and fruits - one-half of your plate: Aim for color and variety, and remember that potatoes don't count.  Go for whole grains - one-quarter of your plate: Whole wheat, barley, wheat berries, quinoa, oats, brown rice, and foods made with them. If you want pasta, go with whole wheat pasta.  Protein power - one-quarter of your plate:  Fish, chicken, beans, and nuts are all healthy, versatile protein sources. Limit red meat.  The diet, however, does go beyond the plate, offering a few other suggestions.  Use healthy plant oils, such as olive, canola, soy, corn, sunflower and peanut. Check the labels, and avoid partially hydrogenated oil, which have unhealthy trans fats.  If you're thirsty, drink water. Coffee and tea are good in moderation, but skip sugary drinks and limit milk and dairy products to one or two daily servings.  The type of carbohydrate in the diet is more important than the amount. Some sources of carbohydrates, such as vegetables, fruits, whole grains, and beans-are healthier than others.  Finally, stay active.     Ezetimibe Tablets What is this medicine? EZETIMIBE (ez ET i mibe) blocks the absorption of cholesterol from the stomach. It can help lower blood cholesterol for patients who are at risk of getting heart disease or a stroke. It is only for patients whose cholesterol level is not controlled by diet. This medicine may be used for other purposes; ask your health care provider or pharmacist if you have questions. COMMON BRAND NAME(S): Zetia What should I tell my health care provider before I take this medicine? They need to know if you have any of these conditions: -liver disease -an unusual or  allergic reaction to ezetimibe, medicines, foods, dyes, or preservatives -pregnant or trying to get pregnant -breast-feeding How should I use this medicine? Take this medicine by mouth with a glass of water. Follow the directions on the prescription label. This medicine can be taken with or without food. Take your doses at regular intervals. Do not take your medicine more often than directed. Talk to your pediatrician regarding the use of this medicine in children. Special care may be needed. Overdosage: If you think you have taken too much of this medicine contact a poison control center or emergency room  at once. NOTE: This medicine is only for you. Do not share this medicine with others. What if I miss a dose? If you miss a dose, take it as soon as you can. If it is almost time for your next dose, take only that dose. Do not take double or extra doses. What may interact with this medicine? Do not take this medicine with any of the following medications: -fenofibrate -gemfibrozil This medicine may also interact with the following medications: -antacids -cyclosporine -herbal medicines like red yeast rice -other medicines to lower cholesterol or triglycerides This list may not describe all possible interactions. Give your health care provider a list of all the medicines, herbs, non-prescription drugs, or dietary supplements you use. Also tell them if you smoke, drink alcohol, or use illegal drugs. Some items may interact with your medicine. What should I watch for while using this medicine? Visit your doctor or health care professional for regular checks on your progress. You will need to have your cholesterol levels checked. If you are also taking some other cholesterol medicines, you will also need to have tests to make sure your liver is working properly. Tell your doctor or health care professional if you get any unexplained muscle pain, tenderness, or weakness, especially if you also have a fever and tiredness. You need to follow a low-cholesterol, low-fat diet while you are taking this medicine. This will decrease your risk of getting heart and blood vessel disease. Exercising and avoiding alcohol and smoking can also help. Ask your doctor or dietician for advice. What side effects may I notice from receiving this medicine? Side effects that you should report to your doctor or health care professional as soon as possible: -allergic reactions like skin rash, itching or hives, swelling of the face, lips, or tongue -dark yellow or brown urine -unusually weak or tired -yellowing of the skin or  eyes Side effects that usually do not require medical attention (report to your doctor or health care professional if they continue or are bothersome): -diarrhea -dizziness -headache -stomach upset or pain This list may not describe all possible side effects. Call your doctor for medical advice about side effects. You may report side effects to FDA at 1-800-FDA-1088. Where should I keep my medicine? Keep out of the reach of children. Store at room temperature between 15 and 30 degrees C (59 and 86 degrees F). Protect from moisture. Keep container tightly closed. Throw away any unused medicine after the expiration date. NOTE: This sheet is a summary. It may not cover all possible information. If you have questions about this medicine, talk to your doctor, pharmacist, or health care provider.  2018 Elsevier/Gold Standard (2012-01-16 15:39:09)

## 2017-12-30 LAB — COMPLETE METABOLIC PANEL WITH GFR
AG RATIO: 1.8 (calc) (ref 1.0–2.5)
ALT: 27 U/L (ref 6–29)
AST: 19 U/L (ref 10–35)
Albumin: 4 g/dL (ref 3.6–5.1)
Alkaline phosphatase (APISO): 97 U/L (ref 33–130)
BILIRUBIN TOTAL: 0.4 mg/dL (ref 0.2–1.2)
BUN: 16 mg/dL (ref 7–25)
CO2: 22 mmol/L (ref 20–32)
Calcium: 9 mg/dL (ref 8.6–10.4)
Chloride: 111 mmol/L — ABNORMAL HIGH (ref 98–110)
Creat: 0.69 mg/dL (ref 0.50–1.05)
GFR, Est African American: 116 mL/min/{1.73_m2} (ref >=60)
GFR, Est Non African American: 100 mL/min/{1.73_m2} (ref >=60)
Globulin: 2.2 g/dL (calc) (ref 1.9–3.7)
Glucose, Bld: 84 mg/dL (ref 65–99)
POTASSIUM: 4.2 mmol/L (ref 3.5–5.3)
Sodium: 141 mmol/L (ref 135–146)
Total Protein: 6.2 g/dL (ref 6.1–8.1)

## 2017-12-30 LAB — CBC WITH DIFFERENTIAL/PLATELET
BASOS ABS: 59 {cells}/uL (ref 0–200)
Basophils Relative: 0.8 %
EOS PCT: 2 %
Eosinophils Absolute: 148 cells/uL (ref 15–500)
HCT: 41.9 % (ref 35.0–45.0)
Hemoglobin: 14.1 g/dL (ref 11.7–15.5)
LYMPHS ABS: 3522 {cells}/uL (ref 850–3900)
MCH: 28.2 pg (ref 27.0–33.0)
MCHC: 33.7 g/dL (ref 32.0–36.0)
MCV: 83.8 fL (ref 80.0–100.0)
MPV: 9.5 fL (ref 7.5–12.5)
Monocytes Relative: 8.9 %
NEUTROS PCT: 40.7 %
Neutro Abs: 3012 cells/uL (ref 1500–7800)
PLATELETS: 267 10*3/uL (ref 140–400)
RBC: 5 10*6/uL (ref 3.80–5.10)
RDW: 12.8 % (ref 11.0–15.0)
TOTAL LYMPHOCYTE: 47.6 %
WBC: 7.4 10*3/uL (ref 3.8–10.8)
WBCMIX: 659 {cells}/uL (ref 200–950)

## 2017-12-30 LAB — LIPID PANEL
Cholesterol: 217 mg/dL — ABNORMAL HIGH (ref <200)
HDL: 36 mg/dL — AB (ref >50)
LDL Cholesterol (Calc): 150 mg/dL (calc) — ABNORMAL HIGH
Non-HDL Cholesterol (Calc): 181 mg/dL (calc) — ABNORMAL HIGH (ref <130)
TRIGLYCERIDES: 174 mg/dL — AB (ref <150)
Total CHOL/HDL Ratio: 6 (calc) — ABNORMAL HIGH (ref <5.0)

## 2017-12-30 LAB — VITAMIN D 25 HYDROXY (VIT D DEFICIENCY, FRACTURES): Vit D, 25-Hydroxy: 24 ng/mL — ABNORMAL LOW (ref 30–100)

## 2017-12-30 LAB — TSH: TSH: 0.52 m[IU]/L

## 2017-12-31 ENCOUNTER — Other Ambulatory Visit: Payer: Self-pay | Admitting: Adult Health

## 2017-12-31 MED ORDER — ROSUVASTATIN CALCIUM 40 MG PO TABS: 40.0000 mg | ORAL_TABLET | Freq: Every day | ORAL | 1 refills | Status: DC

## 2018-01-17 ENCOUNTER — Other Ambulatory Visit: Payer: Self-pay | Admitting: Adult Health

## 2018-01-17 DIAGNOSIS — G43809 Other migraine, not intractable, without status migrainosus: Secondary | ICD-10-CM

## 2018-01-22 ENCOUNTER — Other Ambulatory Visit: Payer: Self-pay

## 2018-01-22 DIAGNOSIS — G43809 Other migraine, not intractable, without status migrainosus: Secondary | ICD-10-CM

## 2018-01-22 MED ORDER — CYCLOBENZAPRINE HCL 10 MG PO TABS: ORAL_TABLET | ORAL | 0 refills | Status: DC

## 2018-02-20 ENCOUNTER — Other Ambulatory Visit: Payer: Self-pay | Admitting: Physician Assistant

## 2018-03-05 ENCOUNTER — Other Ambulatory Visit: Payer: Self-pay | Admitting: Adult Health

## 2018-03-05 MED ORDER — PROMETHAZINE-DM 6.25-15 MG/5ML PO SYRP: 5.0000 mL | ORAL_SOLUTION | Freq: Four times a day (QID) | ORAL | 1 refills | Status: DC | PRN

## 2018-03-05 MED ORDER — PREDNISONE 20 MG PO TABS: ORAL_TABLET | ORAL | 0 refills | Status: DC

## 2018-03-05 NOTE — Progress Notes (Signed)
Patient called to report mild fever, sinus drainage and pressure, cough productive of clear mucus, cough keeping her awake at night and requesting advise, started over the weekend (~3 days). Will send in prednisone, promethazine-DM, advise to take allergy pill, flonase/saline irrigations, call if not resolving by day 7-9, or with new/worsening symptoms needs to be seen.

## 2018-05-08 NOTE — Progress Notes (Signed)
Complete Physical  Assessment and Plan:  Hyperlipidemia - -continue medications, check lipids, decrease fatty foods, increase activity.  -     CBC with Differential/Platelet -     BASIC METABOLIC PANEL WITH GFR -     Hepatic function panel -     Lipid panel -     EKG 12-Lead  Obesity -     TSH - long discussion about weight loss, diet, and exercise  Benign hematuria Check urine  Asthma, unspecified asthma severity, uncomplicated  Vitamin D deficiency -     VITAMIN D 25 Hydroxy (Vit-D Deficiency, Fractures)  Medication management -     Magnesium  Female genuine stress incontinence Weight loss advised  Anxiety Add on lexapro 10-20 mg for hot flashes/anxiety  Screening for diabetes mellitus -     Hemoglobin A1c  Screening for hematuria or proteinuria -     Urinalysis, Routine w reflex microscopic (not at [facility]) -     Microalbumin / creatinine urine ratio  Encounter for general adult medical examination with abnormal findings 1 year  Other migraine without status migrainosus, not intractable Continue medications, controlled, add allergy pill  Needs flu shot -     FLU VACCINE MDCK QUAD W/Preservative   Discussed med's effects and SE's. Screening labs and tests as requested with regular follow-up as recommended. Over 40 minutes of exam, counseling, chart review, and complex, high level critical decision making was performed this visit.   HPI  52 y.o. 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  Her blood pressure has been controlled at home, today their BP is BP: 128/86 She does workout, she walks. She denies chest pain, shortness of breath, dizziness.   She is having hot flashes, states she may have depression. She is not sleeping at night, she is having night sweats as  well.   She is on verapamil and topamax 25mg  BID, have been doing well until the weather changes. She is on xyzal.   She is not on cholesterol medication, she was on crestor but stopped, has not tried zetia but has not been taking it, her cholesterol is not at goal. The cholesterol last visit was:   Lab Results  Component Value Date   CHOL 217 (H) 12/29/2017   HDL 36 (L) 12/29/2017   LDLCALC 150 (H) 12/29/2017   TRIG 174 (H) 12/29/2017   CHOLHDL 6.0 (H) 12/29/2017    Last A1C in the office was:  Lab Results  Component Value Date   HGBA1C 5.3 05/02/2017   Patient is on Vitamin D supplement, 5000 IU daily.   Lab Results  Component Value Date   VD25OH 24 (L) 12/29/2017     BMI is Body mass index is 33.16 kg/m., she is working on diet and exercise.  Wt Readings from Last 3 Encounters:  05/10/18 187 lb 3.2 oz (84.9 kg)  12/29/17 189 lb (85.7 kg)  10/30/17 185 lb (83.9 kg)    Current Medications:  Current Outpatient Medications on File Prior to Visit  Medication Sig Dispense Refill  . albuterol (PROVENTIL HFA) 108 (90 Base) MCG/ACT inhaler 2 puffs.    . ALPRAZolam (XANAX) 0.25 MG tablet Take 1 tablet (0.25 mg total) by mouth 2 (two) times daily as needed. for anxiety 60 tablet 0  . B Complex Vitamins (VITAMIN-B COMPLEX) TABS Take 1 tablet by mouth.    Marland Kitchen  Biotin 10 MG CAPS Take by mouth daily.    . cyclobenzaprine (FLEXERIL) 10 MG tablet TAKE 1 TABLET(10 MG) BY MOUTH AT BEDTIME 90 tablet 0  . topiramate (TOPAMAX) 25 MG tablet TAKE 1 TABLET(25 MG) BY MOUTH TWICE DAILY 180 tablet 0  . verapamil (CALAN-SR) 120 MG CR tablet TAKE 1 TABLET BY MOUTH EVERY NIGHT AT BEDTIME 90 tablet 1   No current facility-administered medications on file prior to visit.    Allergies:  Allergies  Allergen Reactions  . Shellfish Allergy Anaphylaxis  . Shellfish-Derived Products Anaphylaxis   Medical History:  She has Hyperlipidemia; Anxiety; Asthma; Migraine; Vitamin D deficiency; Benign hematuria;  Female genuine stress incontinence; Obesity; and Medication management on their problem list.   Health Maintenance:   Immunization History  Administered Date(s) Administered  . DTaP 10/24/2010  . Influenza Inj Mdck Quad With Preservative 05/02/2017  . Influenza Split 05/11/2012, 05/13/2013  . Influenza-Unspecified 05/11/2015  . PPD Test 08/27/2013, 09/23/2014  . Pneumococcal Polysaccharide-23 08/27/2013  . Tdap 10/24/2010   Tetanus: 2012 Pneumovax: 2015 Prevnar 13: due age 62 Flu vaccine: 2018 Zostavax: N/A LMP: s/p TAH Pap:05/02/2017  MGM: 2016 AT COUSINS  DEXA: N/A Colonoscopy: 10/2016 CT head 2013  Patient Care Team: Unk Pinto, MD as PCP - General (Internal Medicine) Kathie Rhodes, MD as Consulting Physician (Urology)  Surgical History:  She has a past surgical history that includes LEEP; Abdominal hysterectomy (1998); and Tonsilectomy/adenoidectomy with myringotomy. Family History:  Herfamily history includes Alzheimer's disease in her other; Cancer in her maternal aunt and maternal uncle; Depression in her other; Diabetes in her paternal grandmother; Healthy in her daughter, sister, and son; Heart disease in her maternal aunt; Hyperlipidemia in her mother; Liver disease in her father; Migraines in her mother. Social History:  She reports that she quit smoking about 2 years ago. She has never used smokeless tobacco. She reports that she drinks alcohol. She reports that she does not use drugs.  Review of Systems: Review of Systems  Constitutional: Negative.   Eyes: Negative.   Respiratory: Negative.   Cardiovascular: Negative.   Gastrointestinal: Negative.   Genitourinary: Negative.   Musculoskeletal: Negative for joint pain.  Skin: Negative.   Neurological: Positive for headaches. Negative for dizziness, tingling, tremors, sensory change, speech change, focal weakness, seizures and loss of consciousness.  Endo/Heme/Allergies: Negative.    Psychiatric/Behavioral: Negative.     Physical Exam: Estimated body mass index is 33.16 kg/m as calculated from the following:   Height as of this encounter: 5\' 3"  (1.6 m).   Weight as of this encounter: 187 lb 3.2 oz (84.9 kg). BP 128/86   Pulse 82   Temp 97.9 F (36.6 C)   Resp 14   Ht 5\' 3"  (1.6 m)   Wt 187 lb 3.2 oz (84.9 kg)   SpO2 98%   BMI 33.16 kg/m  General Appearance: Well nourished, in no apparent distress.  Eyes: PERRLA, EOMs, conjunctiva no swelling or erythema, normal fundi and vessels.  Sinuses: No Frontal/maxillary tenderness  ENT/Mouth: Ext aud canals clear, normal light reflex with TMs without erythema, bulging. Good dentition. No erythema, swelling, or exudate on post pharynx. Tonsils not swollen or erythematous. Hearing normal.  Neck: Supple, thyroid normal. No bruits  Respiratory: Respiratory effort normal, BS equal bilaterally without rales, rhonchi, wheezing or stridor.  Cardio: RRR without murmurs, rubs or gallops. Brisk peripheral pulses without edema.  Chest: symmetric, with normal excursions and percussion.  Breasts: Symmetric, without lumps, nipple discharge, retractions.  Abdomen: Soft,  nontender, no guarding, rebound, hernias, masses, or organomegaly.  Lymphatics: Non tender without lymphadenopathy.  Genitourinary: defer Musculoskeletal: Full ROM all peripheral extremities,5/5 strength, and normal gait.  Skin: Warm, dry without rashes, lesions, ecchymosis. Neuro: Cranial nerves intact, reflexes equal bilaterally. Normal muscle tone, no cerebellar symptoms. Sensation intact.  Psych: Awake and oriented X 3, normal affect, Insight and Judgment appropriate.   EKG: defer AORTA SCAN: WNL   Vicie Mutters 2:00 PM [facility]

## 2018-05-10 ENCOUNTER — Ambulatory Visit: Payer: BC Managed Care – PPO | Admitting: Physician Assistant

## 2018-05-10 ENCOUNTER — Encounter: Payer: Self-pay | Admitting: Physician Assistant

## 2018-05-10 VITALS — BP 128/86 | HR 82 | Temp 97.9°F | Resp 14 | Ht 63.0 in | Wt 187.2 lb

## 2018-05-10 DIAGNOSIS — Z79899 Other long term (current) drug therapy: Secondary | ICD-10-CM | POA: Diagnosis not present

## 2018-05-10 DIAGNOSIS — N393 Stress incontinence (female) (male): Secondary | ICD-10-CM

## 2018-05-10 DIAGNOSIS — Z0000 Encounter for general adult medical examination without abnormal findings: Secondary | ICD-10-CM | POA: Diagnosis not present

## 2018-05-10 DIAGNOSIS — Z1329 Encounter for screening for other suspected endocrine disorder: Secondary | ICD-10-CM

## 2018-05-10 DIAGNOSIS — Z23 Encounter for immunization: Secondary | ICD-10-CM | POA: Diagnosis not present

## 2018-05-10 DIAGNOSIS — G43809 Other migraine, not intractable, without status migrainosus: Secondary | ICD-10-CM

## 2018-05-10 DIAGNOSIS — Z1389 Encounter for screening for other disorder: Secondary | ICD-10-CM | POA: Diagnosis not present

## 2018-05-10 DIAGNOSIS — Z130 Encounter for screening for diseases of the blood and blood-forming organs and certain disorders involving the immune mechanism: Secondary | ICD-10-CM

## 2018-05-10 DIAGNOSIS — Z131 Encounter for screening for diabetes mellitus: Secondary | ICD-10-CM

## 2018-05-10 DIAGNOSIS — J45909 Unspecified asthma, uncomplicated: Secondary | ICD-10-CM

## 2018-05-10 DIAGNOSIS — E559 Vitamin D deficiency, unspecified: Secondary | ICD-10-CM | POA: Diagnosis not present

## 2018-05-10 DIAGNOSIS — N029 Recurrent and persistent hematuria with unspecified morphologic changes: Secondary | ICD-10-CM

## 2018-05-10 DIAGNOSIS — F419 Anxiety disorder, unspecified: Secondary | ICD-10-CM

## 2018-05-10 DIAGNOSIS — E6609 Other obesity due to excess calories: Secondary | ICD-10-CM

## 2018-05-10 DIAGNOSIS — E785 Hyperlipidemia, unspecified: Secondary | ICD-10-CM

## 2018-05-10 DIAGNOSIS — Z0001 Encounter for general adult medical examination with abnormal findings: Secondary | ICD-10-CM

## 2018-05-10 DIAGNOSIS — Z6832 Body mass index (BMI) 32.0-32.9, adult: Secondary | ICD-10-CM

## 2018-05-10 MED ORDER — ESCITALOPRAM OXALATE 20 MG PO TABS: ORAL_TABLET | ORAL | 2 refills | Status: DC

## 2018-05-10 NOTE — Patient Instructions (Addendum)
Stop the wellbutrin/buproprion for now  Start the lexapro 1/2 pill at night and see how you do Will take 4 weeks to work completely, can increase to a whole pill after that  .ALLERGY MEDICATIONS OVER THE COUNTER  Please pick one of the over the counter allergy medications below and take it once daily for allergies.  Claritin or loratadine cheapest but likely the weakest  Zyrtec or certizine at night because it can make you sleepy The strongest is allegra or fexafinadine  Cheapest at [employer], [employer], [employer]  Check out  Mini habits for weight loss book Check out mindful eating online  Rate your hunger on a scale of 1-10 before you eat anything  2 apps for tracking food is myfitness pal  loseit OR can take picture of your food     When it comes to diets, agreement about the perfect plan isn't easy to find, even among the experts. Experts at the [employer] developed an idea known as the Healthy Eating Plate. Just imagine a plate divided into logical, healthy portions.  The emphasis is on diet quality:  Load up on vegetables and fruits - one-half of your plate: Aim for color and variety, and remember that potatoes don't count.  Go for whole grains - one-quarter of your plate: Whole wheat, barley, wheat berries, quinoa, oats, brown rice, and foods made with them. If you want pasta, go with whole wheat pasta.  Protein power - one-quarter of your plate: Fish, chicken, beans, and nuts are all healthy, versatile protein sources. Limit red meat.  The diet, however, does go beyond the plate, offering a few other suggestions.  Use healthy plant oils, such as olive, canola, soy, corn, sunflower and peanut. Check the labels, and avoid partially hydrogenated oil, which have unhealthy trans fats.  If you're thirsty, drink water. Coffee and tea are good in moderation, but skip sugary drinks and limit milk and dairy products to one or two daily servings.  The type of  carbohydrate in the diet is more important than the amount. Some sources of carbohydrates, such as vegetables, fruits, whole grains, and beans-are healthier than others.  Finally, stay active.

## 2018-05-11 ENCOUNTER — Other Ambulatory Visit: Payer: Self-pay | Admitting: Physician Assistant

## 2018-05-11 LAB — COMPLETE METABOLIC PANEL WITH GFR
AG Ratio: 1.9 (calc) (ref 1.0–2.5)
ALKALINE PHOSPHATASE (APISO): 90 U/L (ref 33–130)
ALT: 22 U/L (ref 6–29)
AST: 15 U/L (ref 10–35)
Albumin: 4.2 g/dL (ref 3.6–5.1)
BUN: 8 mg/dL (ref 7–25)
CHLORIDE: 109 mmol/L (ref 98–110)
CO2: 28 mmol/L (ref 20–32)
Calcium: 9.7 mg/dL (ref 8.6–10.4)
Creat: 0.76 mg/dL (ref 0.50–1.05)
GFR, Est African American: 105 mL/min/{1.73_m2} (ref >=60)
GFR, Est Non African American: 90 mL/min/{1.73_m2} (ref >=60)
Globulin: 2.2 g/dL (calc) (ref 1.9–3.7)
Glucose, Bld: 98 mg/dL (ref 65–99)
Potassium: 4.1 mmol/L (ref 3.5–5.3)
Sodium: 144 mmol/L (ref 135–146)
Total Bilirubin: 0.3 mg/dL (ref 0.2–1.2)
Total Protein: 6.4 g/dL (ref 6.1–8.1)

## 2018-05-11 LAB — CBC WITH DIFFERENTIAL/PLATELET
BASOS ABS: 64 {cells}/uL (ref 0–200)
BASOS PCT: 0.7 %
Eosinophils Absolute: 175 cells/uL (ref 15–500)
Eosinophils Relative: 1.9 %
HCT: 41.7 % (ref 35.0–45.0)
HEMOGLOBIN: 14 g/dL (ref 11.7–15.5)
Lymphs Abs: 4609 cells/uL — ABNORMAL HIGH (ref 850–3900)
MCH: 28.1 pg (ref 27.0–33.0)
MCHC: 33.6 g/dL (ref 32.0–36.0)
MCV: 83.7 fL (ref 80.0–100.0)
MONOS PCT: 5.9 %
MPV: 9.6 fL (ref 7.5–12.5)
NEUTROS ABS: 3809 {cells}/uL (ref 1500–7800)
Neutrophils Relative %: 41.4 %
PLATELETS: 279 10*3/uL (ref 140–400)
RBC: 4.98 10*6/uL (ref 3.80–5.10)
RDW: 12.3 % (ref 11.0–15.0)
Total Lymphocyte: 50.1 %
WBC: 9.2 10*3/uL (ref 3.8–10.8)
WBCMIX: 543 {cells}/uL (ref 200–950)

## 2018-05-11 LAB — URINALYSIS, ROUTINE W REFLEX MICROSCOPIC
Bilirubin Urine: NEGATIVE
Glucose, UA: NEGATIVE
Hgb urine dipstick: NEGATIVE
KETONES UR: NEGATIVE
Leukocytes, UA: NEGATIVE
Nitrite: NEGATIVE
PROTEIN: NEGATIVE
Specific Gravity, Urine: 1.014 (ref 1.001–1.03)
pH: >=8.5 — AB (ref 5.0–8.0)

## 2018-05-11 LAB — IRON, TOTAL/TOTAL IRON BINDING CAP
%SAT: 18 % (calc) (ref 16–45)
Iron: 63 ug/dL (ref 45–160)
TIBC: 360 mcg/dL (calc) (ref 250–450)

## 2018-05-11 LAB — LIPID PANEL
CHOLESTEROL: 216 mg/dL — AB (ref <200)
HDL: 34 mg/dL — ABNORMAL LOW (ref >50)
LDL CHOLESTEROL (CALC): 151 mg/dL — AB
Non-HDL Cholesterol (Calc): 182 mg/dL (calc) — ABNORMAL HIGH (ref <130)
Total CHOL/HDL Ratio: 6.4 (calc) — ABNORMAL HIGH (ref <5.0)
Triglycerides: 172 mg/dL — ABNORMAL HIGH (ref <150)

## 2018-05-11 LAB — HEMOGLOBIN A1C
Hgb A1c MFr Bld: 5.6 % of total Hgb (ref <5.7)
MEAN PLASMA GLUCOSE: 114 (calc)
eAG (mmol/L): 6.3 (calc)

## 2018-05-11 LAB — VITAMIN B12: Vitamin B-12: 305 pg/mL (ref 200–1100)

## 2018-05-11 LAB — VITAMIN D 25 HYDROXY (VIT D DEFICIENCY, FRACTURES): VIT D 25 HYDROXY: 25 ng/mL — AB (ref 30–100)

## 2018-05-11 LAB — MAGNESIUM: Magnesium: 1.9 mg/dL (ref 1.5–2.5)

## 2018-05-11 LAB — MICROALBUMIN / CREATININE URINE RATIO
Creatinine, Urine: 64 mg/dL (ref 20–275)
Microalb Creat Ratio: 8 mcg/mg creat (ref <30)
Microalb, Ur: 0.5 mg/dL

## 2018-05-11 LAB — TSH: TSH: 0.57 mIU/L

## 2018-05-11 MED ORDER — EZETIMIBE 10 MG PO TABS: 10.0000 mg | ORAL_TABLET | Freq: Every day | ORAL | 1 refills | Status: DC

## 2018-05-11 NOTE — Progress Notes (Signed)
Future Appointments  Date Time Provider [place]  08/16/2018  3:45 PM Vicie Mutters, PA-C [id number] None  05/27/2019  3:00 PM Vicie Mutters, PA-C [facility] None   Eugenie Birks start zetia and recheck in Jan

## 2018-08-13 NOTE — Progress Notes (Signed)
Assessment and Plan:   Hypertension -Continue medication, monitor blood pressure at home. Continue DASH diet.  Reminder to go to the ER if any CP, SOB, nausea, dizziness, severe HA, changes vision/speech, left arm numbness and tingling and jaw pain.  Cholesterol -Continue diet and exercise. Check cholesterol.    Obesity with co morbidities - long discussion about weight loss, diet, and exercise   Iron  Recheck with ferritin, get on B!2   Migraines Better controlled, continue the same  Hot flashes Try lexapro  Continue diet and meds as discussed. Further disposition pending results of labs. Over 30 minutes of exam, counseling, chart review, and critical decision making was performed  Future Appointments  Date Time Provider [place]  05/27/2019  3:00 PM Vicie Mutters, PA-C [id number] None    HPI 53 y.o. female  presents for 4 month follow up on hypertension, cholesterol, migraines, and vitamin D deficiency.   She was suppose to start on lexapro for hot flashes/depression but never started.    Her blood pressure has been controlled at home, today their BP is BP: 126/88  She does not workout. She denies chest pain, shortness of breath, dizziness.  She is not on cholesterol medication, she was started on zetia last visit .   Her cholesterol is not at goal. The cholesterol last visit was:   Lab Results  Component Value Date   CHOL 216 (H) 05/10/2018   HDL 34 (L) 05/10/2018   LDLCALC 151 (H) 05/10/2018   TRIG 172 (H) 05/10/2018   CHOLHDL 6.4 (H) 05/10/2018    Last A1C in the office was:  Lab Results  Component Value Date   HGBA1C 5.6 05/10/2018   She states that her migraines are doing well with the flexeril and verapamil, have migraines every few months.   Patient is on Vitamin D supplement, 10,000   Lab Results  Component Value Date   VD25OH 25 (L) 05/10/2018     In addition she has smoking history with hematuria, sent to urology for referral, with normal  evaluation.  BMI is Body mass index is 32.59 kg/m., she is working on diet and exercise. Wt Readings from Last 3 Encounters:  08/16/18 184 lb (83.5 kg)  05/10/18 187 lb 3.2 oz (84.9 kg)  12/29/17 189 lb (85.7 kg)    Current Medications:  Current Outpatient Medications on File Prior to Visit  Medication Sig Dispense Refill  . albuterol (PROVENTIL HFA) 108 (90 Base) MCG/ACT inhaler 2 puffs.    . ALPRAZolam (XANAX) 0.25 MG tablet Take 1 tablet (0.25 mg total) by mouth 2 (two) times daily as needed. for anxiety 60 tablet 0  . B Complex Vitamins (VITAMIN-B COMPLEX) TABS Take 1 tablet by mouth.    . Biotin 10 MG CAPS Take by mouth daily.    . cyclobenzaprine (FLEXERIL) 10 MG tablet TAKE 1 TABLET(10 MG) BY MOUTH AT BEDTIME 90 tablet 0  . escitalopram (LEXAPRO) 20 MG tablet 1/2-1 tablet a day 30 tablet 2  . ezetimibe (ZETIA) 10 MG tablet Take 1 tablet (10 mg total) by mouth daily. 90 tablet 1  . topiramate (TOPAMAX) 25 MG tablet TAKE 1 TABLET(25 MG) BY MOUTH TWICE DAILY 180 tablet 0  . verapamil (CALAN-SR) 120 MG CR tablet TAKE 1 TABLET BY MOUTH EVERY NIGHT AT BEDTIME 90 tablet 1   No current facility-administered medications on file prior to visit.    Medical History:  Past Medical History:  Diagnosis Date  . Allergy   . Anxiety   .  Asthma   . Benign hematuria 2011   [place]  . Hyperlipidemia   . Migraine   . Vitamin D deficiency    Allergies:  Allergies  Allergen Reactions  . Shellfish Allergy Anaphylaxis  . Shellfish-Derived Products Anaphylaxis     Review of Systems:  Review of Systems  Constitutional: Negative.  Negative for chills, fever and malaise/fatigue.  HENT: Negative for congestion, ear discharge, ear pain, hearing loss, nosebleeds, sore throat and tinnitus.   Eyes: Negative for blurred vision, double vision, photophobia, pain, discharge and redness.  Respiratory: Negative.  Negative for shortness of breath and stridor.   Cardiovascular: Negative.  Negative for  chest pain.  Gastrointestinal: Positive for constipation. Negative for abdominal pain, blood in stool, diarrhea, heartburn, melena, nausea and vomiting.  Genitourinary: Negative.   Musculoskeletal: Positive for back pain and myalgias. Negative for falls, joint pain and neck pain.  Skin: Negative.   Neurological: Positive for sensory change (intermittent hands and feet at night). Negative for dizziness, tingling, tremors, speech change, focal weakness, seizures, loss of consciousness and headaches.  Psychiatric/Behavioral: Negative.  Negative for suicidal ideas. The patient is not nervous/anxious.   All other systems reviewed and are negative.   Family history- Review and unchanged Social history- Review and unchanged Physical Exam: BP 126/88   Pulse 68   Temp 97.8 F (36.6 C)   Ht 5\' 3"  (1.6 m)   Wt 184 lb (83.5 kg)   SpO2 98%   BMI 32.59 kg/m  Wt Readings from Last 3 Encounters:  08/16/18 184 lb (83.5 kg)  05/10/18 187 lb 3.2 oz (84.9 kg)  12/29/17 189 lb (85.7 kg)   General Appearance: Well nourished, in no apparent distress. Eyes: PERRLA, EOMs, conjunctiva no swelling or erythema,  Right lacrimal tearing.  Sinuses: No Frontal/maxillary tenderness ENT/Mouth: Ext aud canals clear, TMs without erythema, bulging. No erythema, swelling, or exudate on post pharynx. Right gums with mild swelling/erythema and tenderenss to palpation, no palptaed fluctuance. Tonsils not swollen or erythematous. Hearing normal.  Neck: Supple, thyroid normal.  Respiratory: Respiratory effort normal, BS equal bilaterally without rales, rhonchi, wheezing or stridor.  Cardio: RRR with no MRGs. Brisk peripheral pulses without edema.  Abdomen: Soft, + BS,  Non tender, no guarding, rebound, hernias, masses. Lymphatics: Non tender without lymphadenopathy.  Musculoskeletal: Full ROM, 5/5 strength, Normal gait. Skin: Warm, dry without rashes, lesions, ecchymosis.  Neuro: Cranial nerves intact. Normal muscle  tone, no cerebellar symptoms. Psych: Awake and oriented X 3, normal affect, Insight and Judgment appropriate.    Vicie Mutters, PA-C 4:16 PM [facility]

## 2018-08-16 ENCOUNTER — Encounter: Payer: Self-pay | Admitting: Physician Assistant

## 2018-08-16 ENCOUNTER — Ambulatory Visit (INDEPENDENT_AMBULATORY_CARE_PROVIDER_SITE_OTHER): Payer: BC Managed Care – PPO | Admitting: Physician Assistant

## 2018-08-16 VITALS — BP 126/88 | HR 68 | Temp 97.8°F | Ht 63.0 in | Wt 184.0 lb

## 2018-08-16 DIAGNOSIS — Z6832 Body mass index (BMI) 32.0-32.9, adult: Secondary | ICD-10-CM

## 2018-08-16 DIAGNOSIS — Z130 Encounter for screening for diseases of the blood and blood-forming organs and certain disorders involving the immune mechanism: Secondary | ICD-10-CM

## 2018-08-16 DIAGNOSIS — Z79899 Other long term (current) drug therapy: Secondary | ICD-10-CM

## 2018-08-16 DIAGNOSIS — E785 Hyperlipidemia, unspecified: Secondary | ICD-10-CM

## 2018-08-16 DIAGNOSIS — E6609 Other obesity due to excess calories: Secondary | ICD-10-CM

## 2018-08-16 MED ORDER — ESCITALOPRAM OXALATE 20 MG PO TABS: ORAL_TABLET | ORAL | 2 refills | Status: DC

## 2018-08-16 NOTE — Patient Instructions (Addendum)
Start the lexapro 1/2 pill at night and see how you do Will take 4 weeks to work completely, can increase to a whole pill after that  B12 is low end of normal, add sublingual B12. The sublingual is better than the pills because likely you are not absorbing in your intestines well so the sublingual gets absorbed through your mouth and ensures you get the amount you need. Will help with energy, memory/concentration, decrease nerve pain, and help with weight loss. B12 is water soluble vitamin so you can not over dose on it, and anything you do not use will be sent out in your urine.    Please increase green leafy veggies.  Take an iron supplement daily 325mg  or 65mg  slow release daily, take it with 500mg  Vit C to increase absorption, it can cause constipation or black stool so add on probiotic with the iron OR can try to take every other day.    Golfer's Elbow  Golfer's elbow, also called medial epicondylitis, is a condition that results from inflammation of the strong bands of tissue (tendons) that attach your forearm muscles to the inside of your bone at the elbow. These tendons affect the muscles that bend the palm toward the wrist (flexion). This condition is called golfer's elbow because it is more common among people who constantly bend and twist their wrists, such as golfers. This injury usually results from overuse. Tendons also become less flexible with age. This condition causes elbow pain that may spread to your forearm and upper arm. The pain may get worse when you bend your wrist downward. What are the causes? This condition is an overuse injury that is caused by:  Repeatedly flexing, turning, or twisting your wrist.  Constantly gripping objects with your hands. What increases the risk? This condition is more likely to develop in people who play golf or tennis or have jobs that require the constant use of their hands. This injury is more common  among:  Carpenters.  Gardeners.  Musicians.  Bricklayers.  Typists. What are the signs or symptoms? Symptoms of this condition include:  Pain near the inner elbow or forearm.  Reduced grip strength. How is this diagnosed? This condition is diagnosed based on your symptoms, medical history, and physical exam. During the exam, your health care provider may test your grip strength and move your wrist to check for pain. You may also have an MRI to confirm the diagnosis, look for other issues, and check for tears in the ligaments, muscles, or tendons. How is this treated? Treatment for this condition includes:  Stopping all activities that make you bend or twist your wrist until your pain and other symptoms go away.  Icing your wrist to relieve pain.  Taking NSAIDs or getting corticosteroid injections to reduce pain and swelling.  Doing stretches, range-of-motion, and strengthening exercises (physical therapy) as told by your health care provider. In rare cases, surgery may be needed if your condition does not improve. Follow these instructions at home:  If directed, apply ice to the injured area. ? Put ice in a plastic bag. ? Place a towel between your skin and the bag. ? Leave the ice on for 20 minutes, 2-3 times a day.  Move your fingers often to avoid stiffness.  Raise (elevate) the injured area above the level of your heart while you are sitting or lying down.  Return to your normal activities as told by your health care provider. Ask your health care provider what activities  are safe for you.  Do exercises as told by your health care provider.  Do not use tobacco products, including cigarettes, chewing tobacco, or e-cigarettes. If you need help quitting, ask your health care provider.  Take over-the-counter and prescription medicines only as told by your health care provider.  Keep all follow-up visits as told by your health care provider. This is important. How is  this prevented?  Warm up and stretch before being active.  Cool down and stretch after being active.  Give your body time to rest between periods of activity.  Make sure to use equipment that fits you.  Be safe and responsible while being active to avoid falls.  Do at least 150 minutes of moderate-intensity exercise each week, such as brisk walking or water aerobics.  Maintain physical fitness, including: ? Strength. ? Flexibility. ? Cardiovascular fitness. ? Endurance.  Perform exercises to strengthen the forearm muscles.  Slow your golf swing to reduce shock in the arm when making contact with the ball, if you play golf. Contact a health care provider if:  Your pain does not improve or it gets worse.  You notice numbness in your hand. Get help right away if:  Your pain is severe.  You cannot move your wrist. This information is not intended to replace advice given to you by your health care provider. Make sure you discuss any questions you have with your health care provider. Document Released: 07/11/2005 Document Revised: 03/30/2018 Document Reviewed: 03/23/2015 Elsevier Interactive Patient Education  2019 [employer]   Vitamin B12 Deficiency Vitamin B12 deficiency occurs when the body does not have enough vitamin B12. Vitamin B12 is an important vitamin. The body needs vitamin B12:  To make red blood cells.  To make DNA. This is the genetic material inside cells.  To help the nerves work properly so they can carry messages from the brain to the body. Vitamin B12 deficiency can cause various health problems, such as a low red blood cell count (anemia) or nerve damage. What are the causes? This condition may be caused by:  Not eating enough foods that contain vitamin B12.  Not having enough stomach acid and digestive fluids to properly absorb vitamin B12 from the food that you eat.  Certain digestive system diseases that make it hard to absorb vitamin B12.  These diseases include Crohn disease, chronic pancreatitis, and cystic fibrosis.  Pernicious anemia. This is a condition in which the body does not make enough of a protein (intrinsic factor), resulting in too few red blood cells.  Having a surgery in which part of the stomach or small intestine is removed.  Taking certain medicines that make it hard for the body to absorb vitamin B12. These medicines include: ? Heartburn medicine (antacids and proton pump inhibitors). ? An antibiotic medicine called neomycin. ? Some medicines that are used to treat diabetes, tuberculosis, gout, or high cholesterol. What increases the risk? The following factors may make you more likely to develop a B12 deficiency:  Being older than age 2.  Eating a vegetarian or vegan diet, especially while you are pregnant.  Eating a poor diet while you are pregnant.  Taking certain drugs.  Having alcoholism. What are the signs or symptoms? In some cases, there are no symptoms of this condition. If the condition leads to anemia or nerve damage, various symptoms can occur, such as:  Weakness.  Fatigue.  Numbness or tingling in your hands and feet.  Redness and burning of the tongue.  Confusion or memory problems.  Depression.  Sensory problems, such as color blindness, ringing in the ears, or loss of taste.  Diarrhea or constipation.  Trouble walking. If anemia is severe, symptoms can include:  Shortness of breath.  Dizziness.  Rapid heart rate (tachycardia).  How is this diagnosed? This condition may be diagnosed with a blood test to measure the level of vitamin B12 in your blood. You may have other tests to help find the cause of your vitamin B12 deficiency. These tests may include:  A complete blood count (CBC). This is a group of tests that measure certain characteristics of blood cells.  A blood test to measure intrinsic factor.  An endoscopy. In this procedure, a thin tube with a  camera on the end is used to look into your stomach or intestines. How is this treated? Treatment for this condition depends on the cause. Common treatment options include:  Changing your eating and drinking habits, such as: ? Eating more foods that contain vitamin B12. ? Drinking less alcohol or no alcohol.  Taking vitamin B12 supplements. Your health care provider will tell you which dosage is best for you.  Getting vitamin B12 injections. Follow these instructions at home:  Take supplements only as told by your health care provider. Follow the directions carefully.  Get any injections that are prescribed by your health care provider.  Do not miss your appointments.  Eat lots of healthy foods that contain vitamin B12. Ask your health care provider if you should work with a dietitian. Foods that contain vitamin B12 include: ? Meat. ? Meat from birds (poultry). ? Fish. ? Eggs. ? Cereal and dairy products that are fortified. This means that vitamin B12 has been added to the food. Check the label on the package to see if the food is fortified.  Do not abuse alcohol.  Keep all follow-up visits as told by your health care provider. This is important. Contact a health care provider if:  Your symptoms come back. Get help right away if:  You develop shortness of breath.  You have chest pain.  You become dizzy or you lose consciousness. This information is not intended to replace advice given to you by your health care provider. Make sure you discuss any questions you have with your health care provider. Document Released: 10/03/2011 Document Revised: 12/23/2015 Document Reviewed: 11/26/2014 Elsevier Interactive Patient Education  2019 [facility] Ask your health care provider which exercises are safe for you. Do exercises exactly as told by your health care provider and adjust them as directed. It is normal to feel mild stretching, pulling, tightness,  or discomfort as you do these exercises, but you should stop right away if you feel sudden pain or your pain gets worse. Do not begin these exercises until told by your health care provider. Stretching and range of motion exercises These exercises warm up your muscles and joints and improve the movement and flexibility of your elbow. Exercise A: Wrist flexors  1. Straighten your left / right elbow in front of you with your palm up. If told by your health care provider, do this stretch with your elbow bent rather than straight. 2. With your other hand, gently pull your left / right hand and fingers toward you until you feel a gentle stretch on the top of your forearm. 3. Hold this position for __________ seconds. Repeat __________ times. Complete this exercise __________ times a day. Strengthening exercises These exercises  build strength and endurance in your elbow. Endurance is the ability to use your muscles for a long time, even after several repetitions. Exercise B: Wrist flexion  1. Sit with your left / right forearm palm-up and supported on a table or other surface. 2. Let your left / right wrist extend over the edge of the surface. 3. Hold a __________ weight or a piece of rubber exercise band or tubing. Slowly curl your hand up toward your forearm. Try to only move your hand and keep the rest of your arm still. 4. Hold this position for __________ seconds. 5. Slowly return to the starting position. Repeat __________ times. Complete this exercise __________ times a day. Exercise C: Eccentric wrist flexion 1. Sit with your left / right forearm palm-up and supported on a table or other surface. 2. Let your left / right wrist extend over the edge of the surface. 3. Hold a __________ weight or a piece of rubber exercise band or tubing. 4. Use your other hand to move your left / right hand up toward your forearm. 5. Slowly return to the starting position using only your left / right  hand. Repeat __________ times. Complete this exercise __________ times a day. Exercise D: Hand turns, pronation i 1. Sit with your left / right forearm supported on a table or other surface. 2. Move your wrist so your pinkie travels toward your forearm and your thumb moves away from your forearm. 3. Hold this position for __________ seconds. 4. Slowly return to the starting position. Exercise E: Hand turns, pronation ii  1. Sit with your left / right forearm supported on a table or other surface. 2. Hold a hammer in your left / right hand. ? The exercise will be easier if you hold on near the head of the hammer. ? If you hold on toward the end of the handle, the exercise will be harder. 3. Rest your left / right hand over the edge of the table with your palm facing up. 4. Without moving your elbow, slowly turn your palm and your hand down toward the table. 5. Hold this position for __________ seconds. 6. Slowly return to the starting position. Repeat __________ times. Complete this exercise __________ times a day. Exercise F: Shoulder blade squeeze 1. Sit in a stable chair with good posture. Do not let your back touch the back of the chair. 2. Your arms should be at your sides with your elbows bent. You can rest your forearms on a pillow. 3. Squeeze your shoulder blades together. Keep your shoulders level. Do not lift your shoulders up toward your ears. 4. Hold this position for __________ seconds. 5. Slowly release. Repeat __________ times. Complete this exercise __________ times a day. This information is not intended to replace advice given to you by your health care provider. Make sure you discuss any questions you have with your health care provider. Document Released: 07/11/2005 Document Revised: 03/17/2016 Document Reviewed: 03/23/2015 Elsevier Interactive Patient Education  2019 [employer].

## 2018-08-17 LAB — CBC WITH DIFFERENTIAL/PLATELET
ABSOLUTE MONOCYTES: 599 {cells}/uL (ref 200–950)
BASOS ABS: 57 {cells}/uL (ref 0–200)
BASOS PCT: 0.7 %
EOS ABS: 178 {cells}/uL (ref 15–500)
Eosinophils Relative: 2.2 %
HEMATOCRIT: 43.9 % (ref 35.0–45.0)
HEMOGLOBIN: 14.6 g/dL (ref 11.7–15.5)
Lymphs Abs: 4382 cells/uL — ABNORMAL HIGH (ref 850–3900)
MCH: 28.3 pg (ref 27.0–33.0)
MCHC: 33.3 g/dL (ref 32.0–36.0)
MCV: 85.1 fL (ref 80.0–100.0)
MONOS PCT: 7.4 %
MPV: 9.2 fL (ref 7.5–12.5)
NEUTROS ABS: 2884 {cells}/uL (ref 1500–7800)
Neutrophils Relative %: 35.6 %
Platelets: 292 10*3/uL (ref 140–400)
RBC: 5.16 10*6/uL — ABNORMAL HIGH (ref 3.80–5.10)
RDW: 12.3 % (ref 11.0–15.0)
Total Lymphocyte: 54.1 %
WBC: 8.1 10*3/uL (ref 3.8–10.8)

## 2018-08-17 LAB — IRON, TOTAL/TOTAL IRON BINDING CAP
%SAT: 15 % (calc) — ABNORMAL LOW (ref 16–45)
IRON: 51 ug/dL (ref 45–160)
TIBC: 339 ug/dL (ref 250–450)

## 2018-08-17 LAB — LIPID PANEL
CHOLESTEROL: 341 mg/dL — AB (ref <200)
HDL: 40 mg/dL — ABNORMAL LOW (ref >50)
LDL CHOLESTEROL (CALC): 261 mg/dL — AB
Non-HDL Cholesterol (Calc): 301 mg/dL (calc) — ABNORMAL HIGH (ref <130)
Total CHOL/HDL Ratio: 8.5 (calc) — ABNORMAL HIGH (ref <5.0)
Triglycerides: 210 mg/dL — ABNORMAL HIGH (ref <150)

## 2018-08-17 LAB — FERRITIN: FERRITIN: 83 ng/mL (ref 16–232)

## 2018-08-17 LAB — COMPLETE METABOLIC PANEL WITH GFR
AG RATIO: 1.6 (calc) (ref 1.0–2.5)
ALBUMIN MSPROF: 4.1 g/dL (ref 3.6–5.1)
ALKALINE PHOSPHATASE (APISO): 101 U/L (ref 33–130)
ALT: 14 U/L (ref 6–29)
AST: 13 U/L (ref 10–35)
BILIRUBIN TOTAL: 0.3 mg/dL (ref 0.2–1.2)
BUN: 12 mg/dL (ref 7–25)
CHLORIDE: 106 mmol/L (ref 98–110)
CO2: 28 mmol/L (ref 20–32)
Calcium: 9.6 mg/dL (ref 8.6–10.4)
Creat: 0.82 mg/dL (ref 0.50–1.05)
GFR, Est African American: 95 mL/min/{1.73_m2} (ref >=60)
GFR, Est Non African American: 82 mL/min/{1.73_m2} (ref >=60)
GLOBULIN: 2.5 g/dL (ref 1.9–3.7)
Glucose, Bld: 89 mg/dL (ref 65–99)
POTASSIUM: 4.6 mmol/L (ref 3.5–5.3)
Sodium: 142 mmol/L (ref 135–146)
TOTAL PROTEIN: 6.6 g/dL (ref 6.1–8.1)

## 2018-08-17 LAB — TSH: TSH: 0.45 mIU/L

## 2018-10-09 ENCOUNTER — Other Ambulatory Visit: Payer: Self-pay | Admitting: Physician Assistant

## 2018-12-25 NOTE — Progress Notes (Signed)
Assessment and Plan:   Hypertension -Continue medication, monitor blood pressure at home. Continue DASH diet.  Reminder to go to the ER if any CP, SOB, nausea, dizziness, severe HA, changes vision/speech, left arm numbness and tingling and jaw pain.  Cholesterol -Continue diet and exercise. Check cholesterol.  - possible familial cholesterolemia - check on zetia intolerate of crestor,may try to add on lipitor/pravastatin or sent to lipid clinic for PCSK inhibitor.    Obesity with co morbidities - long discussion about weight loss, diet, and exercise   Iron  Recheck B12   Migraines Better controlled, continue the same  Hot flashes Stay on lexapro  Continue diet and meds as discussed. Further disposition pending results of labs. Over 30 minutes of exam, counseling, chart review, and critical decision making was performed  Future Appointments  Date Time Provider [place]  05/27/2019  3:00 PM Vicie Mutters, PA-C [id number] None    HPI 53 y.o. female  presents for 4 month follow up on hypertension, cholesterol, migraines, and vitamin D deficiency.   She was suppose to start on lexapro for hot flashes and doing well.   BMI is Body mass index is 32.06 kg/m., she is working on diet and exercise. She does light breakfast like yogurt, peanut butter with celery snack, salad with light salt and pepper, baked chicken and veggies. She is walking.  Wt Readings from Last 3 Encounters:  12/26/18 181 lb (82.1 kg)  08/16/18 184 lb (83.5 kg)  05/10/18 187 lb 3.2 oz (84.9 kg)    Her blood pressure has been controlled at home, today their BP is BP: 132/84  She does workout, she is walking. She denies chest pain, shortness of breath, dizziness.  She is not on cholesterol medication, unknown if she was on zetia last visit, will recheck today, she has been intolerant to crestor in the past.   Her cholesterol is not at goal. Father died in [place]'N uncertain. ? familial hypercholesterolemia.   The cholesterol last visit was:   Lab Results  Component Value Date   CHOL 341 (H) 08/16/2018   HDL 40 (L) 08/16/2018   LDLCALC 261 (H) 08/16/2018   TRIG 210 (H) 08/16/2018   CHOLHDL 8.5 (H) 08/16/2018    Last A1C in the office was:  Lab Results  Component Value Date   HGBA1C 5.6 05/10/2018   She states that her migraines are a little more active, she is on felxeril, verapamil and topamax.   Patient is on Vitamin D supplement, 10,000 but not consistent.    Lab Results  Component Value Date   VD25OH 25 (L) 05/10/2018     In addition she has smoking history with hematuria, sent to urology for referral, with normal evaluation.   Current Medications:  Current Outpatient Medications on File Prior to Visit  Medication Sig Dispense Refill  . albuterol (PROVENTIL HFA) 108 (90 Base) MCG/ACT inhaler 2 puffs.    . ALPRAZolam (XANAX) 0.25 MG tablet Take 1 tablet (0.25 mg total) by mouth 2 (two) times daily as needed. for anxiety 60 tablet 0  . B Complex Vitamins (VITAMIN-B COMPLEX) TABS Take 1 tablet by mouth.    . Biotin 10 MG CAPS Take by mouth daily.    . cyclobenzaprine (FLEXERIL) 10 MG tablet TAKE 1 TABLET(10 MG) BY MOUTH AT BEDTIME 90 tablet 0  . escitalopram (LEXAPRO) 20 MG tablet TAKE 1/2 TO 1 TABLET BY MOUTH EVERY DAY 30 tablet 2  . ezetimibe (ZETIA) 10 MG tablet Take 1 tablet (  10 mg total) by mouth daily. 90 tablet 1  . topiramate (TOPAMAX) 25 MG tablet TAKE 1 TABLET(25 MG) BY MOUTH TWICE DAILY 180 tablet 0  . verapamil (CALAN-SR) 120 MG CR tablet TAKE 1 TABLET BY MOUTH EVERY NIGHT AT BEDTIME 90 tablet 1   No current facility-administered medications on file prior to visit.    Medical History:  Past Medical History:  Diagnosis Date  . Allergy   . Anxiety   . Asthma   . Benign hematuria 2011   [place]  . Hyperlipidemia   . Migraine   . Vitamin D deficiency    Allergies:  Allergies  Allergen Reactions  . Shellfish Allergy Anaphylaxis  . Shellfish-Derived Products  Anaphylaxis     Review of Systems:  Review of Systems  Constitutional: Negative.  Negative for chills, fever and malaise/fatigue.  HENT: Negative for congestion, ear discharge, ear pain, hearing loss, nosebleeds, sore throat and tinnitus.   Eyes: Negative for blurred vision, double vision, photophobia, pain, discharge and redness.  Respiratory: Negative.  Negative for shortness of breath and stridor.   Cardiovascular: Negative.  Negative for chest pain.  Gastrointestinal: Negative for abdominal pain, blood in stool, constipation, diarrhea, heartburn, melena, nausea and vomiting.  Genitourinary: Negative.   Musculoskeletal: Positive for back pain and myalgias. Negative for falls, joint pain and neck pain.  Skin: Negative.   Neurological: Negative for dizziness, tingling, tremors, sensory change, speech change, focal weakness, seizures, loss of consciousness and headaches.  Psychiatric/Behavioral: Negative.  Negative for suicidal ideas. The patient is not nervous/anxious.   All other systems reviewed and are negative.   Family history- Review and unchanged Social history- Review and unchanged Physical Exam: BP 132/84   Temp 97.6 F (36.4 C)   Ht 5\' 3"  (1.6 m)   Wt 181 lb (82.1 kg)   BMI 32.06 kg/m  Wt Readings from Last 3 Encounters:  12/26/18 181 lb (82.1 kg)  08/16/18 184 lb (83.5 kg)  05/10/18 187 lb 3.2 oz (84.9 kg)   General Appearance: Well nourished, in no apparent distress. Eyes: PERRLA, EOMs, conjunctiva no swelling or erythema,  Right lacrimal tearing.  Sinuses: No Frontal/maxillary tenderness ENT/Mouth: Ext aud canals clear, TMs without erythema, bulging. No erythema, swelling, or exudate on post pharynx. Right gums with mild swelling/erythema and tenderenss to palpation, no palptaed fluctuance. Tonsils not swollen or erythematous. Hearing normal.  Neck: Supple, thyroid normal.  Respiratory: Respiratory effort normal, BS equal bilaterally without rales, rhonchi,  wheezing or stridor.  Cardio: RRR with no MRGs. Brisk peripheral pulses without edema.  Abdomen: Soft, + BS,  Non tender, no guarding, rebound, hernias, masses. Lymphatics: Non tender without lymphadenopathy.  Musculoskeletal: Full ROM, 5/5 strength, Normal gait. Skin: Warm, dry without rashes, lesions, ecchymosis.  Neuro: Cranial nerves intact. Normal muscle tone, no cerebellar symptoms. Psych: Awake and oriented X 3, normal affect, Insight and Judgment appropriate.    Vicie Mutters, PA-C 3:46 PM [facility]

## 2018-12-26 ENCOUNTER — Other Ambulatory Visit: Payer: Self-pay

## 2018-12-26 ENCOUNTER — Encounter: Payer: Self-pay | Admitting: Physician Assistant

## 2018-12-26 ENCOUNTER — Ambulatory Visit: Payer: BC Managed Care – PPO | Admitting: Physician Assistant

## 2018-12-26 VITALS — BP 132/84 | Temp 97.6°F | Ht 63.0 in | Wt 181.0 lb

## 2018-12-26 DIAGNOSIS — E785 Hyperlipidemia, unspecified: Secondary | ICD-10-CM | POA: Diagnosis not present

## 2018-12-26 DIAGNOSIS — Z79899 Other long term (current) drug therapy: Secondary | ICD-10-CM

## 2018-12-26 DIAGNOSIS — E538 Deficiency of other specified B group vitamins: Secondary | ICD-10-CM

## 2018-12-26 DIAGNOSIS — E6609 Other obesity due to excess calories: Secondary | ICD-10-CM

## 2018-12-26 DIAGNOSIS — Z6832 Body mass index (BMI) 32.0-32.9, adult: Secondary | ICD-10-CM

## 2018-12-26 DIAGNOSIS — N393 Stress incontinence (female) (male): Secondary | ICD-10-CM | POA: Diagnosis not present

## 2018-12-26 DIAGNOSIS — E559 Vitamin D deficiency, unspecified: Secondary | ICD-10-CM

## 2018-12-26 NOTE — Patient Instructions (Addendum)
Do not take biotin 2 weeks before your appointment for labs  Your LDL could improve, ideally we want it under a 100. Yours was 261 Will likely try pravastatin since you were not able   Your LDL is the bad cholesterol that can lead to heart attack and stroke. To lower your number you can decrease your fatty foods, red meat, cheese, milk and increase fiber like whole grains and veggies. You can also add a fiber supplement like Citracel or Benefiber, these do not cause gas and bloating and are safe to use. Especially if you have a strong family history of heart disease or stroke or you have evidence of plaque on any imaging like a chest xray, we may discuss at your next office visit putting you on a medication to get your number below 100.   Familial Hypercholesterolemia Familial hypercholesterolemia (FH) is a genetic disorder that causes a very high level of LDL (low-density lipoprotein) cholesterol. Cholesterol is a waxy, fat-like substance that your body needs to build cells. Your body makes all the cholesterol it needs in the liver and removes extra (excess) cholesterol from the blood as needed. Excess cholesterol comes from food that you eat. In people who have FH, the body is not able to remove LDL cholesterol from the blood as it should. A high level of LDL cholesterol puts you at higher risk for narrowing and hardening of your arteries (atherosclerosis) at an early age. This raises your risk for heart disease and stroke. What are the causes? FH is passed from parent to child (inherited). FH is caused by an inherited gene defect (genetic mutation) that makes it hard for the liver to remove LDL cholesterol from the blood. The gene may be inherited from one parent or both parents. What increases the risk? You may be at higher risk for FH if:  You have a family history of the condition. If both parents carry the genetic mutation, their children are at higher risk for a more severe form of FH, with  symptoms that start at an earlier age. What are the signs or symptoms? You may have a high level of LDL cholesterol before you develop symptoms. Symptoms of FH may include:  Cholesterol nodules (xanthomas) on the cords of tissue that connect muscles to bones (tendons). Xanthomas often form on the long tendon at the back of the ankle (Achilles tendon) or on the tendons on the back of the hands.  Cholesterol deposits (xanthelasmas) under the skin of the eyelids.  A gray or blue ring around the white part of the eye (corneal arcus). Complications of FH can occur due to atherosclerosis. Atherosclerosis may cause damage to an area of the body that is not getting enough blood. Complications of FH may include:  Chest pain (angina) and shortness of breath due to narrowed or blocked arteries in the heart (coronary artery disease).  Pain and cramping in the back of the lower legs (calves) when walking (claudication).  Interruption in blood flow to the brain (stroke). This may cause: ? Loss of balance. ? Vision loss. ? Sudden weakness or numbness on one side of the body. How is this diagnosed? This condition may be diagnosed based on:  Your symptoms.  Your medical history, including any family history of FH or early coronary heart disease.  A physical exam.  A blood test to check for the genetic mutation that causes FH. Your family members may also be tested. How is this treated? There is no cure for  FH, but treatment can lower LDL cholesterol levels and lower your risk for heart attack or stroke. Treatment should be started as soon as you are diagnosed. Treatment may include:  A type of medicine that lowers your cholesterol (statin). If statins do not help, your health care provider may try other kinds of cholesterol-lowering medicines. The exact combination of medicines depends on the severity of your symptoms.  A procedure to filter LDL from your blood (apheresis). You may need this  treatment if you have a severe form of FH.  Making lifestyle changes that are healthy for your heart, such as lowering the amount of fat and cholesterol in your diet. Follow these instructions at home: Lifestyle   Lose weight, if directed by your health care provider.  Follow instructions from your health care provider about eating a healthy diet. Your health care provider may recommend: ? Working with a diet and nutrition specialist (dietitian), who can help you make a healthy eating plan and help you maintain a healthy weight. ? Eating less fat and cholesterol. Avoid fatty meats, fried foods, and whole-fat dairy. ? Eating more vegetables, fruits, and whole grains. ? Limiting your intake of alcohol.  Be physically active. Ask your health care provider what type of exercise is best for you.  Do not use any products that contain nicotine or tobacco, such as cigarettes and e-cigarettes. If you need help quitting, ask your health care provider.  Work with your health care provider to manage any other conditions you have, such as high blood pressure (hypertension) or diabetes. These conditions affect your heart. General instructions  Take over-the-counter and prescription medicines only as told by your health care provider.  Keep all follow-up visits as told by your health care provider. This is important. Contact a health care provider if:  You have pain or cramps in your calf when you walk. Get help right away if:   You have sudden, unexplained discomfort in your chest, arms, back, neck, jaw, or upper body.  You have trouble breathing.  You have a sudden, severe headache with no known cause.  You have any symptoms of a stroke. "BE FAST" is an easy way to remember the main warning signs of a stroke: ? B - Balance. Signs are dizziness, sudden trouble walking, or loss of balance. ? E - Eyes. Signs are trouble seeing or a sudden change in vision. ? F - Face. Signs are sudden  weakness or numbness of the face, or the face or eyelid drooping on one side. ? A - Arms. Signs are weakness or numbness in an arm. This happens suddenly and usually on one side of the body. ? S - Speech. Signs are sudden trouble speaking, slurred speech, or trouble understanding what people say. ? T - Time. Time to call emergency services. Write down what time symptoms started. These symptoms may represent a serious problem that is an emergency. Do not wait to see if the symptoms will go away. Get medical help right away. Call your local emergency services (911 in the [place] Do not drive yourself to the hospital. Summary  Familial hypercholesterolemia (FH) is a genetic disorder that causes a very high level of LDL (low-density lipoprotein) cholesterol.  FH increases your risk for coronary heart disease and stroke at an early age.  Treatment for FH should be started as soon as you are diagnosed with the condition. Treatment is aimed at lowering your risk for complications.  Follow instructions from your health care provider  about eating a healthy diet. Your health care provider may recommend eating less fat and cholesterol. This information is not intended to replace advice given to you by your health care provider. Make sure you discuss any questions you have with your health care provider. Document Released: 12/22/2016 Document Revised: 12/22/2016 Document Reviewed: 12/22/2016 Elsevier Interactive Patient Education  2019 [employer].  Will refer to podiatry for tarsal tunnel syndrome if you don't get better  Anterior Tarsal Tunnel Syndrome Anterior tarsal tunnel syndrome is a condition that happens when pressure is put on a nerve that passes through the front of your ankle. This nerve supplies the muscles that you use to move your foot and toes up toward your shin. It also supplies the skin between your big toe and second toe. What are the causes? This condition may be caused  by:  Pressure on the nerve, such as from footwear that is too tight or from a bony growth.  Having an unstable ankle.  Swelling in the front of your ankle. What increases the risk? This condition is more likely to develop in people who participate in sports that:  Involve repeatedly using the front of the ankle to hit something, such as soccer and martial arts.  Involve wearing a tight boot or skate, such as skiing or hockey.  Can easily lead to a sprained ankle. What are the signs or symptoms? Symptoms of this condition can start quickly or develop gradually. Symptoms include:  Pain or aching in the top of the foot.  Numbness or tingling between the big and second toe.  Difficulty bringing the toes upward.  Pain when pointing the foot down. At first, symptoms may be relieved with rest. Over time, symptoms may be present all the time. How is this diagnosed? This condition may be diagnosed based on:  Your symptoms.  Your medical history.  A physical exam.  Tests, such as: ? An X-ray to check your bones. ? MRI to check your nerves and tendons. ? An electromyogram (EMG) to check your nerves. During the physical exam, your health care provider may tap on the area below your ankle to check for tingling in your foot or toes. Your health care provider may also inject a numbing medicine at the top of your ankle to see if it relieves your pain. How is this treated? Treatment for this condition may involve:  Adding padding to the front of your shoe, skate, or boot to reduce pressure on your nerve.  Using ice to reduce swelling.  Taking anti-inflammatory pain medicine.  Having medicine injected into your ankle joint to reduce pain and swelling.  Doing exercises when pain and swelling improve.  Gradually returning to full activity.  Having surgery. Surgery may be needed if there is a bone growth or if other treatments have not helped. Follow these instructions at  home: Managing pain, stiffness, and swelling  If directed, put ice on the injured area: ? Put ice in a plastic bag. ? Place a towel between your skin and the bag. ? Leave the ice on for 20 minutes, 2-3 times a day.  Raise (elevate) the injured area above the level of your heart while you are sitting or lying down.  Take over-the-counter and prescription medicines only as told by your health care provider. Activity  Return to your normal activities as told by your health care provider. Ask your health care provider what activities are safe for you.  Do not place your full body weight  on your ankle until your health care provider says that you can.  Do not do any activities that make pain or swelling worse.  Do exercises as told by your health care provider. General instructions  Add padding to your footwear as told by your health care provider.  Keep all follow-up visits as told by your health care provider. This is important. How is this prevented?  Avoid athletic activities that cause ankle pain or swelling.  Do not lace your shoes or boots too tight.  When you play contact sports, wear protective equipment over the front of your ankle.  Do not put your feet under a solid object like a bar while doing sit ups.  If you start any new athletic activity, start gradually to build up your strength and flexibility. Contact a health care provider if:  Your foot or ankle pain is not getting better after 2-4 weeks of treatment.  You are unable to support (bear) your body weight on your ankle without feeling pain. This information is not intended to replace advice given to you by your health care provider. Make sure you discuss any questions you have with your health care provider. Document Released: 02/09/2005 Document Revised: 03/14/2016 Document Reviewed: 05/29/2015 Elsevier Interactive Patient Education  2019 [employer]  Anterior Tarsal Tunnel Syndrome Rehab Ask your  health care provider which exercises are safe for you. Do exercises exactly as told by your health care provider and adjust them as directed. It is normal to feel mild stretching, pulling, tightness, or discomfort as you do these exercises, but you should stop right away if you feel sudden pain or your pain gets worse.Do not begin these exercises until told by your health care provider. Strengthening exercises These exercises build strength and endurance in your ankle. Endurance is the ability to use your muscles for a long time, even after they get tired. Exercise A: Eversion 1. Sit on the floor with your legs straight out in front of you. 2. Loop a rubber exercise band around your left / right foot around the ball of your foot. The ball of your foot is on the walking surface, right under your toes. Hold the ends of the band in your hands, or secure the band to a stable object. 3. Slowly push your foot outward, away from your other leg. 4. Hold this position for __________ seconds. 5. Slowly return your foot to the starting position. Repeat __________ times. Complete this exercise __________ times a day. Exercise B: Heel walking (dorsiflexion) Walk on your heels for __________. Keep your toes as high as possible. Repeat __________ times. Complete this exercise __________ times a day. Balance exercises These exercises improve or maintain your balance. Balance is important in improving ankle stability and preventing falls. Exercise C: Tandem walking Do this exercise in a hallway or room that is at least 10 ft. (3 m) long. 1. Stand with one foot directly in front of the other. You can use the walls to help you balance if needed, but try not to use them for support 2. Slowly lift your back foot and place it directly in front of your other foot. 3. Continue to walk in this heel-to-toe way for __________. Keep your balance without holding on to something for support. Repeat __________ times. Complete  this exercise __________ times a day. Exercise D: Single leg stand 1. Without shoes, stand near a railing or in a doorway. You may hold onto the railing or door frame as needed. 2.  Stand on your left / right foot. Keep your big toe down on the floor, and try to keep your arch lifted. If this is too easy, try one of these options during the exercise: ? Stand with your eyes closed. ? Stand on a pillow. ? Throw a ball against a wall. 3. Hold this position for __________ seconds. Repeat __________ times. Complete this exercise __________ times a day. Exercise E: Inversion/eversion 1  You will need a balance board for this exercise. Ask your health care provider where you can get a balance board or how you can make one. 1. Stand on a non-carpeted surface near a countertop or wall. 2. Step onto the balance board so your feet are hip-width apart. 3. Keep your feet in place and keep your upper body and hips steady. Using only your feet and ankles, tip the board from side to side as far as you can, alternating between tipping to the left and to the right. If you can, tip the board so it silently taps the floor. Do not let the board forcefully hit the floor. Repeat __________ times, pausing from time to time to hold a steady position. Complete this exercise __________ times a day. Exercise F: Inversion/eversion 2 You will need a balance board for this exercise. Ask your health care provider where you can get a balance board or how you can make one. 1. Stand on a non-carpeted surface near a countertop or wall. 2. Step onto the balance board so your feet are hip-width apart. 3. Keep your feet in place and keep your upper body and hips steady. Using only your feet and ankles, tip the board from side to side, alternating between tipping to the left and to the right.Do not let the board hit the floor at all. Repeat __________ times, pausing from time to time to hold a steady position. Complete this exercise  __________ times a day. Exercise G: Plantar flexion/dorsiflexion 1  You will need a balance board for this exercise. Ask your health care provider where you can get a balance board or how you can make one. 1. Stand on a non-carpeted surface near a countertop or wall. 2. Step onto the balance board so your feet are hip-width apart. 3. Keep your feet in place and keep your upper body and hips steady. Using only your feet and ankles, tip the board forward and backward so the board silently taps the floor. Do not let the board forcefully hit the floor. Repeat __________ times, pausing from time to time to hold a steady position. Complete this exercise __________ times a day. Exercise H: Plantar flexion/dorsiflexion 2 You will need a balance board for this exercise. Ask your health care provider where you can get a balance board or how you can make one. 1. Stand on a non-carpeted surface near a countertop or wall. 2. Step onto the balance board so your feet are hip-width apart. 3. Keep your feet in place and keep your upper body and hips steady. Using only your feet and ankles, tip the board forward and backward.Do not let the board hit the floor at all. Repeat __________ times, pausing from time to time to hold a steady position. Complete this exercise __________ times a day. This information is not intended to replace advice given to you by your health care provider. Make sure you discuss any questions you have with your health care provider. Document Released: 02/09/2005 Document Revised: 03/17/2016 Document Reviewed: 05/29/2015 Elsevier Interactive Patient Education  2019 [place].

## 2018-12-27 LAB — CBC WITH DIFFERENTIAL/PLATELET
Absolute Monocytes: 561 cells/uL (ref 200–950)
Basophils Absolute: 63 cells/uL (ref 0–200)
Basophils Relative: 0.8 %
Eosinophils Absolute: 142 cells/uL (ref 15–500)
Eosinophils Relative: 1.8 %
HCT: 43.4 % (ref 35.0–45.0)
Hemoglobin: 14.2 g/dL (ref 11.7–15.5)
Lymphs Abs: 4511 cells/uL — ABNORMAL HIGH (ref 850–3900)
MCH: 28.3 pg (ref 27.0–33.0)
MCHC: 32.7 g/dL (ref 32.0–36.0)
MCV: 86.6 fL (ref 80.0–100.0)
MPV: 9.1 fL (ref 7.5–12.5)
Monocytes Relative: 7.1 %
Neutro Abs: 2623 cells/uL (ref 1500–7800)
Neutrophils Relative %: 33.2 %
Platelets: 281 10*3/uL (ref 140–400)
RBC: 5.01 10*6/uL (ref 3.80–5.10)
RDW: 12.8 % (ref 11.0–15.0)
Total Lymphocyte: 57.1 %
WBC: 7.9 10*3/uL (ref 3.8–10.8)

## 2018-12-27 LAB — LIPID PANEL
Cholesterol: 343 mg/dL — ABNORMAL HIGH (ref <200)
HDL: 37 mg/dL — ABNORMAL LOW (ref >=50)
LDL Cholesterol (Calc): 244 mg/dL (calc) — ABNORMAL HIGH
Non-HDL Cholesterol (Calc): 306 mg/dL (calc) — ABNORMAL HIGH (ref <130)
Total CHOL/HDL Ratio: 9.3 (calc) — ABNORMAL HIGH (ref <5.0)
Triglycerides: 364 mg/dL — ABNORMAL HIGH (ref <150)

## 2018-12-27 LAB — COMPLETE METABOLIC PANEL WITH GFR
AG Ratio: 1.8 (calc) (ref 1.0–2.5)
ALT: 13 U/L (ref 6–29)
AST: 11 U/L (ref 10–35)
Albumin: 4.1 g/dL (ref 3.6–5.1)
Alkaline phosphatase (APISO): 104 U/L (ref 37–153)
BUN: 11 mg/dL (ref 7–25)
CO2: 23 mmol/L (ref 20–32)
Calcium: 9.1 mg/dL (ref 8.6–10.4)
Chloride: 110 mmol/L (ref 98–110)
Creat: 0.71 mg/dL (ref 0.50–1.05)
GFR, Est African American: 113 mL/min/{1.73_m2} (ref >=60)
GFR, Est Non African American: 97 mL/min/{1.73_m2} (ref >=60)
Globulin: 2.3 g/dL (calc) (ref 1.9–3.7)
Glucose, Bld: 90 mg/dL (ref 65–99)
Potassium: 3.8 mmol/L (ref 3.5–5.3)
Sodium: 141 mmol/L (ref 135–146)
Total Bilirubin: 0.2 mg/dL (ref 0.2–1.2)
Total Protein: 6.4 g/dL (ref 6.1–8.1)

## 2018-12-27 LAB — MAGNESIUM: Magnesium: 1.9 mg/dL (ref 1.5–2.5)

## 2018-12-27 LAB — TSH: TSH: 0.43 mIU/L

## 2018-12-27 LAB — VITAMIN B12: Vitamin B-12: 293 pg/mL (ref 200–1100)

## 2018-12-27 LAB — VITAMIN D 25 HYDROXY (VIT D DEFICIENCY, FRACTURES): Vit D, 25-Hydroxy: 18 ng/mL — ABNORMAL LOW (ref 30–100)

## 2019-01-03 ENCOUNTER — Other Ambulatory Visit: Payer: Self-pay | Admitting: Physician Assistant

## 2019-01-21 ENCOUNTER — Other Ambulatory Visit: Payer: Self-pay | Admitting: Physician Assistant

## 2019-04-09 ENCOUNTER — Other Ambulatory Visit: Payer: Self-pay | Admitting: Adult Health

## 2019-05-13 ENCOUNTER — Other Ambulatory Visit: Payer: Self-pay

## 2019-05-13 DIAGNOSIS — Z20822 Contact with and (suspected) exposure to covid-19: Secondary | ICD-10-CM

## 2019-05-15 LAB — NOVEL CORONAVIRUS, NAA: SARS-CoV-2, NAA: NOT DETECTED

## 2019-05-20 ENCOUNTER — Ambulatory Visit: Payer: BC Managed Care – PPO | Admitting: Physician Assistant

## 2019-05-23 NOTE — Progress Notes (Addendum)
Complete Physical  Assessment and Plan:  Hyperlipidemia Continue medications: Zetia 10mg .  Has failed statin trials in past.  check lipids, decrease fatty foods, increase activity.  -     Lipid panel [place] 15% cardiovascular risk assessment  Obesity -     TSH - long discussion about weight loss, diet, and exercise  Benign hematuria Check urine  Asthma, unspecified asthma severity, uncomplicated  Vitamin D deficiency -     VITAMIN D 25 Hydroxy (Vit-D Deficiency, Fractures)  Medication management -     Magnesium  Female genuine stress incontinence Weight loss advised  Anxiety Add on lexapro 10-20 mg for hot flashes/anxiety  Screening for diabetes mellitus -     Hemoglobin A1c  Screening for hematuria or proteinuria -     Urinalysis, Routine w reflex microscopic (not at [facility]) -     Microalbumin / creatinine urine ratio  Encounter for general adult medical examination with abnormal findings 1 year  Other migraine without status migrainosus, not intractable Continue medications, controlled, add allergy pill  Needs flu shot -     FLU VACCINE MDCK QUAD W/Preservative   Discussed med's effects and SE's. Screening labs and tests as requested with regular follow-up as recommended. Over 40 minutes of exam, counseling, chart review, and complex, high level critical decision making was performed this visit.   HPI  53 y.o. 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  Her blood pressure has been controlled at home, today their BP is BP: 126/76 She does workout, she walks. She denies chest pain, shortness of breath, dizziness.   She is having hot flashes, states she may have depression. She is not sleeping at night, she is having night sweats as well.   She is on verapamil  and topamax 25mg  BID, have been doing well until the weather changes. She is on xyzal.   She is on cholesterol medication, she was on crestor but stopped related to adverse side effects.  She is taking Zetia 10mg  and tolerating well.  [place] risk assessment 15%. Not taking bASA.  Her cholesterol is not at goal. The cholesterol last visit was:   Lab Results  Component Value Date   CHOL 343 (H) 12/26/2018   HDL 37 (L) 12/26/2018   LDLCALC 244 (H) 12/26/2018   TRIG 364 (H) 12/26/2018   CHOLHDL 9.3 (H) 12/26/2018    Last A1C in the office was:  Lab Results  Component Value Date   HGBA1C 5.6 05/10/2018   Patient is on Vitamin D supplement, 5000 IU daily.   Lab Results  Component Value Date   VD25OH 18 (L) 12/26/2018     BMI is Body mass index is 31.73 kg/m., she is working on diet and exercise.  Wt Readings from Last 3 Encounters:  05/27/19 182 lb (82.6 kg)  12/26/18 181 lb (82.1 kg)  08/16/18 184 lb (83.5 kg)    Current Medications:  Current Outpatient Medications on File Prior to Visit  Medication Sig Dispense Refill  . albuterol (PROVENTIL HFA) 108 (90 Base) MCG/ACT inhaler 2 puffs.    . B Complex Vitamins (VITAMIN-B COMPLEX) TABS Take 1 tablet by mouth.    . Biotin 10 MG CAPS Take by mouth daily.    . cyclobenzaprine (FLEXERIL) 10 MG tablet TAKE 1 TABLET(10 MG) BY MOUTH AT BEDTIME 90 tablet 0  . ezetimibe (  ZETIA) 10 MG tablet TAKE 1 TABLET(10 MG) BY MOUTH DAILY 90 tablet 1  . topiramate (TOPAMAX) 25 MG tablet TAKE 1 TABLET(25 MG) BY MOUTH TWICE DAILY 180 tablet 0  . verapamil (CALAN-SR) 120 MG CR tablet TAKE 1 TABLET BY MOUTH EVERY NIGHT AT BEDTIME 90 tablet 1   No current facility-administered medications on file prior to visit.    Allergies:  Allergies  Allergen Reactions  . Shellfish Allergy Anaphylaxis  . Shellfish-Derived Products Anaphylaxis   Medical History:  She has Hyperlipidemia; Anxiety; Asthma; Migraine; Vitamin D deficiency; Benign hematuria; Female  genuine stress incontinence; Obesity; and Medication management on their problem list.   Health Maintenance:   Immunization History  Administered Date(s) Administered  . DTaP 10/24/2010  . Fluad Quad(high Dose 65+) 05/26/2019  . Influenza Inj Mdck Quad With Preservative 05/02/2017, 05/10/2018  . Influenza Split 05/11/2012, 05/13/2013  . Influenza,inj,Quad PF,6+ Mos 05/26/2019  . Influenza-Unspecified 05/11/2015  . PPD Test 08/27/2013, 09/23/2014  . Pneumococcal Polysaccharide-23 08/27/2013  . Tdap 10/24/2010   Tetanus: 2012 Pneumovax: 2015 Prevnar 13: due age 31 Flu vaccine: 2018 Zostavax: N/A LMP: s/p TAH Pap:05/02/2017  MGM: 2016 AT COUSINS  DEXA: N/A Colonoscopy: 10/2016 CT head 2013  Patient Care Team: Unk Pinto, MD as PCP - General (Internal Medicine) Kathie Rhodes, MD as Consulting Physician (Urology)  Surgical History:  She has a past surgical history that includes LEEP; Abdominal hysterectomy (1998); and Tonsilectomy/adenoidectomy with myringotomy. Family History:  Herfamily history includes Alzheimer's disease in an other family member; Cancer in her maternal aunt and maternal uncle; Depression in an other family member; Diabetes in her paternal grandmother; Healthy in her daughter, sister, and son; Heart disease in her maternal aunt; Hyperlipidemia in her mother; Liver disease in her father; Migraines in her mother. Social History:  She reports that she quit smoking about 3 years ago. She has never used smokeless tobacco. She reports current alcohol use. She reports that she does not use drugs.  Review of Systems: Review of Systems  Constitutional: Negative.   Eyes: Negative.   Respiratory: Negative.   Cardiovascular: Negative.   Gastrointestinal: Negative.   Genitourinary: Negative.   Musculoskeletal: Negative for joint pain.  Skin: Negative.   Neurological: Positive for headaches. Negative for dizziness, tingling, tremors, sensory change, speech  change, focal weakness, seizures and loss of consciousness.  Endo/Heme/Allergies: Negative.   Psychiatric/Behavioral: Negative.     Physical Exam: Estimated body mass index is 31.73 kg/m as calculated from the following:   Height as of this encounter: 5' 3.5" (1.613 m).   Weight as of this encounter: 182 lb (82.6 kg). BP 126/76   Pulse 67   Temp 97.7 F (36.5 C)   Ht 5' 3.5" (1.613 m)   Wt 182 lb (82.6 kg)   SpO2 98%   BMI 31.73 kg/m  General Appearance: Well nourished, in no apparent distress.  Eyes: PERRLA, EOMs, conjunctiva no swelling or erythema, normal fundi and vessels.  Sinuses: No Frontal/maxillary tenderness  ENT/Mouth: Ext aud canals clear, normal light reflex with TMs without erythema, bulging. Good dentition. No erythema, swelling, or exudate on post pharynx. Tonsils not swollen or erythematous. Hearing normal.  Neck: Supple, thyroid normal. No bruits  Respiratory: Respiratory effort normal, BS equal bilaterally without rales, rhonchi, wheezing or stridor.  Cardio: RRR without murmurs, rubs or gallops. Brisk peripheral pulses without edema.  Chest: symmetric, with normal excursions and percussion.  Breasts: Symmetric, without lumps, nipple discharge, retractions.  Abdomen: Soft, nontender, no guarding, rebound, hernias,  masses, or organomegaly.  Lymphatics: Non tender without lymphadenopathy.  Genitourinary: defer Musculoskeletal: Full ROM all peripheral extremities,5/5 strength, and normal gait.  Skin: Warm, dry without rashes, lesions, ecchymosis. Neuro: Cranial nerves intact, reflexes equal bilaterally. Normal muscle tone, no cerebellar symptoms. Sensation intact.  Psych: Awake and oriented X 3, normal affect, Insight and Judgment appropriate.   EKG: defer AORTA SCAN: WNL   Scarleth Brame 10:09 PM [facility]

## 2019-05-26 NOTE — Patient Instructions (Signed)
We will respond via MyChart with your lab results in 1-3 days  Monitor your hot flashes, how many and duration.  Also take note of any possible triggers  To we will give you samples of Ubrelvy '50mg'$  and '100mg'$ .  Try the '50mg'$  tablets first.  Take one '50mg'$  tablet at onset of migraine symptoms.  If not resolved in 2 hours take second '50mg'$  tablet. If this does not work.  Next time you have a migraine try the '100mg'$  tablet. Do not take more the '200mg'$  in 24hours.  Today we use Cryotherapy to take care of actinic keratosis & skin tags to your neck.  This will be healed in two weeks.  The skin may peel, keep are clean and dry.    Vit D  & Vit C 1,000 mg   are recommended to help protect  against the Covid-19 and other Corona viruses.    Also it's recommended  to take  Zinc 50 mg  to help  protect against the Covid-19   and best place to get  is also on [employer].[email or web address]  and don't pay more than 6-8 cents /pill !  ================================ Coronavirus (COVID-19) Are you at risk?  Are you at risk for the Coronavirus (COVID-19)?  To be considered HIGH RISK for Coronavirus (COVID-19), you have to meet the following criteria:  . Traveled to [place], [place], [place], [place] or [place]; or in the [place] to [place], [place], [place]  . or [place]; and have fever, cough, and shortness of breath within the last 2 weeks of travel OR . Been in close contact with a person diagnosed with COVID-19 within the last 2 weeks and have  . fever, cough,and shortness of breath .  . IF YOU DO NOT MEET THESE CRITERIA, YOU ARE CONSIDERED LOW RISK FOR COVID-19.  What to do if you are HIGH RISK for COVID-19?  Marland Kitchen If you are having a medical emergency, call 911. . Seek medical care right away. Before you go to a doctor's office, urgent care or emergency department, .  call ahead and tell them about your recent travel, contact with someone diagnosed with COVID-19  .  and your symptoms.  . You  should receive instructions from your physician's office regarding next steps of care.  . When you arrive at healthcare provider, tell the healthcare staff immediately you have returned from  . visiting [place], [place], [place], [place] or [place]; or traveled in the [place] to [place], [place],  . [place] or [place] in the last two weeks or you have been in close contact with a person diagnosed with  . COVID-19 in the last 2 weeks.   . Tell the health care staff about your symptoms: fever, cough and shortness of breath. . After you have been seen by a medical provider, you will be either: o Tested for (COVID-19) and discharged home on quarantine except to seek medical care if  o symptoms worsen, and asked to  - Stay home and avoid contact with others until you get your results (4-5 days)  - Avoid travel on public transportation if possible (such as bus, train, or airplane) or o Sent to the Emergency Department by EMS for evaluation, COVID-19 testing  and  o possible admission depending on your condition and test results.  What to do if you are LOW RISK for COVID-19?  Reduce your risk of any infection by using the same precautions used for avoiding the common  cold or flu:  Marland Kitchen Wash your hands often with soap and warm water for at least 20 seconds.  If soap and water are not readily available,  . use an alcohol-based hand sanitizer with at least 60% alcohol.  . If coughing or sneezing, cover your mouth and nose by coughing or sneezing into the elbow areas of your shirt or coat, .  into a tissue or into your sleeve (not your hands). . Avoid shaking hands with others and consider head nods or verbal greetings only. . Avoid touching your eyes, nose, or mouth with unwashed hands.  . Avoid close contact with people who are sick. . Avoid places or events with large numbers of people in one location, like concerts or sporting events. . Carefully consider travel plans you have or are  making. . If you are planning any travel outside or inside the [place], visit the CDC's Travelers' Health webpage for the latest health notices. . If you have some symptoms but not all symptoms, continue to monitor at home and seek medical attention  . if your symptoms worsen. . If you are having a medical emergency, call 911. >>>>>>>>>>>>>>>>>>>>>>> Preventive Care for Adults  A healthy lifestyle and preventive care can promote health and wellness. Preventive health guidelines for women include the following key practices.  A routine yearly physical is a good way to check with your health care provider about your health and preventive screening. It is a chance to share any concerns and updates on your health and to receive a thorough exam.  Visit your dentist for a routine exam and preventive care every 6 months. Brush your teeth twice a day and floss once a day. Good oral hygiene prevents tooth decay and gum disease.  The frequency of eye exams is based on your age, health, family medical history, use of contact lenses, and other factors. Follow your health care provider's recommendations for frequency of eye exams.  Eat a healthy diet. Foods like vegetables, fruits, whole grains, low-fat dairy products, and lean protein foods contain the nutrients you need without too many calories. Decrease your intake of foods high in solid fats, added sugars, and salt. Eat the right amount of calories for you. Get information about a proper diet from your health care provider, if necessary.  Regular physical exercise is one of the most important things you can do for your health. Most adults should get at least 150 minutes of moderate-intensity exercise (any activity that increases your heart rate and causes you to sweat) each week. In addition, most adults need muscle-strengthening exercises on 2 or more days a week.  Maintain a healthy weight. The body mass index (BMI) is a screening tool to identify possible  weight problems. It provides an estimate of body fat based on height and weight. Your health care provider can find your BMI and can help you achieve or maintain a healthy weight. For adults 20 years and older:  A BMI below 18.5 is considered underweight.  A BMI of 18.5 to 24.9 is normal.  A BMI of 25 to 29.9 is considered overweight.  A BMI of 30 and above is considered obese.  Maintain normal blood lipids and cholesterol levels by exercising and minimizing your intake of saturated fat. Eat a balanced diet with plenty of fruit and vegetables. Blood tests for lipids and cholesterol should begin at age 47 and be repeated every 5 years. If your lipid or cholesterol levels are high, you are over 50, or  you are at high risk for heart disease, you may need your cholesterol levels checked more frequently. Ongoing high lipid and cholesterol levels should be treated with medicines if diet and exercise are not working.  If you smoke, find out from your health care provider how to quit. If you do not use tobacco, do not start.  Lung cancer screening is recommended for adults aged 95-80 years who are at high risk for developing lung cancer because of a history of smoking. A yearly low-dose CT scan of the lungs is recommended for people who have at least a 30-pack-year history of smoking and are a current smoker or have quit within the past 15 years. A pack year of smoking is smoking an average of 1 pack of cigarettes a day for 1 year (for example: 1 pack a day for 30 years or 2 packs a day for 15 years). Yearly screening should continue until the smoker has stopped smoking for at least 15 years. Yearly screening should be stopped for people who develop a health problem that would prevent them from having lung cancer treatment.  High blood pressure causes heart disease and increases the risk of stroke. Your blood pressure should be checked at least every 1 to 2 years. Ongoing high blood pressure should be  treated with medicines if weight loss and exercise do not work.  If you are 45-82 years old, ask your health care provider if you should take aspirin to prevent strokes.  Diabetes screening involves taking a blood sample to check your fasting blood sugar level. This should be done once every 3 years, after age 71, if you are within normal weight and without risk factors for diabetes. Testing should be considered at a younger age or be carried out more frequently if you are overweight and have at least 1 risk factor for diabetes.  Breast cancer screening is essential preventive care for women. You should practice "breast self-awareness." This means understanding the normal appearance and feel of your breasts and may include breast self-examination. Any changes detected, no matter how small, should be reported to a health care provider. Women in their 44s and 30s should have a clinical breast exam (CBE) by a health care provider as part of a regular health exam every 1 to 3 years. After age 63, women should have a CBE every year. Starting at age 65, women should consider having a mammogram (breast X-ray test) every year. Women who have a family history of breast cancer should talk to their health care provider about genetic screening. Women at a high risk of breast cancer should talk to their health care providers about having an MRI and a mammogram every year.  Breast cancer gene (BRCA)-related cancer risk assessment is recommended for women who have family members with BRCA-related cancers. BRCA-related cancers include breast, ovarian, tubal, and peritoneal cancers. Having family members with these cancers may be associated with an increased risk for harmful changes (mutations) in the breast cancer genes BRCA1 and BRCA2. Results of the assessment will determine the need for genetic counseling and BRCA1 and BRCA2 testing.  Routine pelvic exams to screen for cancer are no longer recommended for nonpregnant  women who are considered low risk for cancer of the pelvic organs (ovaries, uterus, and vagina) and who do not have symptoms. Ask your health care provider if a screening pelvic exam is right for you.  If you have had past treatment for cervical cancer or a condition that could lead to  cancer, you need Pap tests and screening for cancer for at least 20 years after your treatment. If Pap tests have been discontinued, your risk factors (such as having a new sexual partner) need to be reassessed to determine if screening should be resumed. Some women have medical problems that increase the chance of getting cervical cancer. In these cases, your health care provider may recommend more frequent screening and Pap tests.  Colorectal cancer can be detected and often prevented. Most routine colorectal cancer screening begins at the age of 90 years and continues through age 30 years. However, your health care provider may recommend screening at an earlier age if you have risk factors for colon cancer. On a yearly basis, your health care provider may provide home test kits to check for hidden blood in the stool. Use of a small camera at the end of a tube, to directly examine the colon (sigmoidoscopy or colonoscopy), can detect the earliest forms of colorectal cancer. Talk to your health care provider about this at age 74, when routine screening begins.  Direct exam of the colon should be repeated every 5-10 years through age 69 years, unless early forms of pre-cancerous polyps or small growths are found.  Hepatitis C blood testing is recommended for all people born from [birth year] through 1965 and any individual with known risks for hepatitis C.  Pra  Osteoporosis is a disease in which the bones lose minerals and strength with aging. This can result in serious bone fractures or breaks. The risk of osteoporosis can be identified using a bone density scan. Women ages 7 years and over and women at risk for fractures or  osteoporosis should discuss screening with their health care providers. Ask your health care provider whether you should take a calcium supplement or vitamin D to reduce the rate of osteoporosis.  Menopause can be associated with physical symptoms and risks. Hormone replacement therapy is available to decrease symptoms and risks. You should talk to your health care provider about whether hormone replacement therapy is right for you.  Use sunscreen. Apply sunscreen liberally and repeatedly throughout the day. You should seek shade when your shadow is shorter than you. Protect yourself by wearing long sleeves, pants, a wide-brimmed hat, and sunglasses year round, whenever you are outdoors.  Once a month, do a whole body skin exam, using a mirror to look at the skin on your back. Tell your health care provider of new moles, moles that have irregular borders, moles that are larger than a pencil eraser, or moles that have changed in shape or color.  Stay current with required vaccines (immunizations).  Influenza vaccine. All adults should be immunized every year.  Tetanus, diphtheria, and acellular pertussis (Td, Tdap) vaccine. Pregnant women should receive 1 dose of Tdap vaccine during each pregnancy. The dose should be obtained regardless of the length of time since the last dose. Immunization is preferred during the 27th-36th week of gestation. An adult who has not previously received Tdap or who does not know her vaccine status should receive 1 dose of Tdap. This initial dose should be followed by tetanus and diphtheria toxoids (Td) booster doses every 10 years. Adults with an unknown or incomplete history of completing a 3-dose immunization series with Td-containing vaccines should begin or complete a primary immunization series including a Tdap dose. Adults should receive a Td booster every 10 years.  Varicella vaccine. An adult without evidence of immunity to varicella should receive 2 doses or a  second  dose if she has previously received 1 dose. Pregnant females who do not have evidence of immunity should receive the first dose after pregnancy. This first dose should be obtained before leaving the health care facility. The second dose should be obtained 4-8 weeks after the first dose.  Human papillomavirus (HPV) vaccine. Females aged 13-26 years who have not received the vaccine previously should obtain the 3-dose series. The vaccine is not recommended for use in pregnant females. However, pregnancy testing is not needed before receiving a dose. If a female is found to be pregnant after receiving a dose, no treatment is needed. In that case, the remaining doses should be delayed until after the pregnancy. Immunization is recommended for any person with an immunocompromised condition through the age of 21 years if she did not get any or all doses earlier. During the 3-dose series, the second dose should be obtained 4-8 weeks after the first dose. The third dose should be obtained 24 weeks after the first dose and 16 weeks after the second dose.  Zoster vaccine. One dose is recommended for adults aged 55 years or older unless certain conditions are present.  Measles, mumps, and rubella (MMR) vaccine. Adults born before [birth year] generally are considered immune to measles and mumps. Adults born in [birth year] or later should have 1 or more doses of MMR vaccine unless there is a contraindication to the vaccine or there is laboratory evidence of immunity to each of the three diseases. A routine second dose of MMR vaccine should be obtained at least 28 days after the first dose for students attending postsecondary schools, health care workers, or international travelers. People who received inactivated measles vaccine or an unknown type of measles vaccine during 1963-1967 should receive 2 doses of MMR vaccine. People who received inactivated mumps vaccine or an unknown type of mumps vaccine before 1979 and are at high  risk for mumps infection should consider immunization with 2 doses of MMR vaccine. For females of childbearing age, rubella immunity should be determined. If there is no evidence of immunity, females who are not pregnant should be vaccinated. If there is no evidence of immunity, females who are pregnant should delay immunization until after pregnancy. Unvaccinated health care workers born before [birth year] who lack laboratory evidence of measles, mumps, or rubella immunity or laboratory confirmation of disease should consider measles and mumps immunization with 2 doses of MMR vaccine or rubella immunization with 1 dose of MMR vaccine.  Pneumococcal 13-valent conjugate (PCV13) vaccine. When indicated, a person who is uncertain of her immunization history and has no record of immunization should receive the PCV13 vaccine. An adult aged 2 years or older who has certain medical conditions and has not been previously immunized should receive 1 dose of PCV13 vaccine. This PCV13 should be followed with a dose of pneumococcal polysaccharide (PPSV23) vaccine. The PPSV23 vaccine dose should be obtained at least 1 or more year(s) after the dose of PCV13 vaccine. An adult aged 73 years or older who has certain medical conditions and previously received 1 or more doses of PPSV23 vaccine should receive 1 dose of PCV13. The PCV13 vaccine dose should be obtained 1 or more years after the last PPSV23 vaccine dose.    Pneumococcal polysaccharide (PPSV23) vaccine. When PCV13 is also indicated, PCV13 should be obtained first. All adults aged 77 years and older should be immunized. An adult younger than age 61 years who has certain medical conditions should be immunized. Any person who resides in a  nursing home or long-term care facility should be immunized. An adult smoker should be immunized. People with an immunocompromised condition and certain other conditions should receive both PCV13 and PPSV23 vaccines. People with human  immunodeficiency virus (HIV) infection should be immunized as soon as possible after diagnosis. Immunization during chemotherapy or radiation therapy should be avoided. Routine use of PPSV23 vaccine is not recommended for American Indians, [place] Natives, or people younger than 65 years unless there are medical conditions that require PPSV23 vaccine. When indicated, people who have unknown immunization and have no record of immunization should receive PPSV23 vaccine. One-time revaccination 5 years after the first dose of PPSV23 is recommended for people aged 19-64 years who have chronic kidney failure, nephrotic syndrome, asplenia, or immunocompromised conditions. People who received 1-2 doses of PPSV23 before age 63 years should receive another dose of PPSV23 vaccine at age 29 years or later if at least 5 years have passed since the previous dose. Doses of PPSV23 are not needed for people immunized with PPSV23 at or after age 67 years.  Preventive Services / Frequency   Ages 23 to 58 years  Blood pressure check.  Lipid and cholesterol check.  Lung cancer screening. / Every year if you are aged 52-80 years and have a 30-pack-year history of smoking and currently smoke or have quit within the past 15 years. Yearly screening is stopped once you have quit smoking for at least 15 years or develop a health problem that would prevent you from having lung cancer treatment.  Clinical breast exam.** / Every year after age 57 years.   BRCA-related cancer risk assessment.** / For women who have family members with a BRCA-related cancer (breast, ovarian, tubal, or peritoneal cancers).  Mammogram.** / Every year beginning at age 36 years and continuing for as long as you are in good health. Consult with your health care provider.  Pap test.** / Every 3 years starting at age 21 years through age 48 or 19 years with a history of 3 consecutive normal Pap tests.  HPV screening.** / Every 3 years from ages 70  years through ages 33 to 82 years with a history of 3 consecutive normal Pap tests.  Fecal occult blood test (FOBT) of stool. / Every year beginning at age 42 years and continuing until age 36 years. You may not need to do this test if you get a colonoscopy every 10 years.  Flexible sigmoidoscopy or colonoscopy.** / Every 5 years for a flexible sigmoidoscopy or every 10 years for a colonoscopy beginning at age 53 years and continuing until age 26 years.  Hepatitis C blood test.** / For all people born from [birth year] through 1966 and any individual with known risks for hepatitis C.  Skin self-exam. / Monthly.  Influenza vaccine. / Every year.  Tetanus, diphtheria, and acellular pertussis (Tdap/Td) vaccine.** / Consult your health care provider. Pregnant women should receive 1 dose of Tdap vaccine during each pregnancy. 1 dose of Td every 10 years.  Varicella vaccine.** / Consult your health care provider. Pregnant females who do not have evidence of immunity should receive the first dose after pregnancy.  Zoster vaccine.** / 1 dose for adults aged 72 years or older.  Pneumococcal 13-valent conjugate (PCV13) vaccine.** / Consult your health care provider.  Pneumococcal polysaccharide (PPSV23) vaccine.** / 1 to 2 doses if you smoke cigarettes or if you have certain conditions.  Meningococcal vaccine.** / Consult your health care provider.  Hepatitis A vaccine.** / Consult your health  care provider.  Hepatitis B vaccine.** / Consult your health care provider. Screening for abdominal aortic aneurysm (AAA)  by ultrasound is recommended for people over 50 who have history of high blood pressure or who are current or former smokers. ++++++++++++++++++ Recommend Adult Low Dose Aspirin or  coated  Aspirin 81 mg daily  To reduce risk of Colon Cancer 40 %,  Skin Cancer 26 % ,  Melanoma 46%  and  Pancreatic cancer 60% +++++++++++++++++++ Vitamin D goal  is between 70-100.  Please make sure that  you are taking your Vitamin D as directed.  It is very important as a natural anti-inflammatory  helping hair, skin, and nails, as well as reducing stroke and heart attack risk.  It helps your bones and helps with mood. It also decreases numerous cancer risks so please take it as directed.  Low Vit D is associated with a 200-300% higher risk for CANCER  and 200-300% higher risk for HEART   ATTACK  &  STROKE.   .....................................Marland Kitchen It is also associated with higher death rate at younger ages,  autoimmune diseases like Rheumatoid arthritis, Lupus, Multiple Sclerosis.    Also many other serious conditions, like depression, Alzheimer's Dementia, infertility, muscle aches, fatigue, fibromyalgia - just to name a few. ++++++++++++++++++ Recommend the book "The END of DIETING" by Dr Excell Seltzer  & the book "The END of DIABETES " by Dr Excell Seltzer At [email or web address] - get book & Audio CD's    Being diabetic has a  300% increased risk for heart attack, stroke, cancer, and alzheimer- type vascular dementia. It is very important that you work harder with diet by avoiding all foods that are white. Avoid white rice (brown & wild rice is OK), white potatoes (sweetpotatoes in moderation is OK), White bread or wheat bread or anything made out of white flour like bagels, donuts, rolls, buns, biscuits, cakes, pastries, cookies, pizza crust, and pasta (made from white flour & egg whites) - vegetarian pasta or spinach or wheat pasta is OK. Multigrain breads like Arnold's or [employer], or multigrain sandwich thins or flatbreads.  Diet, exercise and weight loss can reverse and cure diabetes in the early stages.  Diet, exercise and weight loss is very important in the control and prevention of complications of diabetes which affects every system in your body, ie. Brain - dementia/stroke, eyes - glaucoma/blindness, heart - heart attack/heart failure, kidneys - dialysis, stomach - gastric paralysis,  intestines - malabsorption, nerves - severe painful neuritis, circulation - gangrene & loss of a leg(s), and finally cancer and Alzheimers.    I recommend avoid fried & greasy foods,  sweets/candy, white rice (brown or wild rice or Quinoa is OK), white potatoes (sweet potatoes are OK) - anything made from white flour - bagels, doughnuts, rolls, buns, biscuits,white and wheat breads, pizza crust and traditional pasta made of white flour & egg white(vegetarian pasta or spinach or wheat pasta is OK).  Multi-grain bread is OK - like multi-grain flat bread or sandwich thins. Avoid alcohol in excess. Exercise is also important.    Eat all the vegetables you want - avoid meat, especially red meat and dairy - especially cheese.  Cheese is the most concentrated form of trans-fats which is the worst thing to clog up our arteries. Veggie cheese is OK which can be found in the fresh produce section at [employer] or [employer] or [employer]  ++++++++++++++++++++++ DASH Eating Plan  DASH stands for "Dietary Approaches to Stop Hypertension."  The DASH eating plan is a healthy eating plan that has been shown to reduce high blood pressure (hypertension). Additional health benefits may include reducing the risk of type 2 diabetes mellitus, heart disease, and stroke. The DASH eating plan may also help with weight loss. WHAT DO I NEED TO KNOW ABOUT THE DASH EATING PLAN? For the DASH eating plan, you will follow these general guidelines:  Choose foods with a percent daily value for sodium of less than 5% (as listed on the food label).  Use salt-free seasonings or herbs instead of table salt or sea salt.  Check with your health care provider or pharmacist before using salt substitutes.  Eat lower-sodium products, often labeled as "lower sodium" or "no salt added."  Eat fresh foods.  Eat more vegetables, fruits, and low-fat dairy products.  Choose whole grains. Look for the word "whole" as the first word in  the ingredient list.  Choose fish   Limit sweets, desserts, sugars, and sugary drinks.  Choose heart-healthy fats.  Eat veggie cheese   Eat more home-cooked food and less restaurant, buffet, and fast food.  Limit fried foods.  Cook foods using methods other than frying.  Limit canned vegetables. If you do use them, rinse them well to decrease the sodium.  When eating at a restaurant, ask that your food be prepared with less salt, or no salt if possible.                      WHAT FOODS CAN I EAT? Read Dr Fara Olden Fuhrman's books on The End of Dieting & The End of Diabetes  Grains Whole grain or whole wheat bread. Brown rice. Whole grain or whole wheat pasta. Quinoa, bulgur, and whole grain cereals. Low-sodium cereals. Corn or whole wheat flour tortillas. Whole grain cornbread. Whole grain crackers. Low-sodium crackers.  Vegetables Fresh or frozen vegetables (raw, steamed, roasted, or grilled). Low-sodium or reduced-sodium tomato and vegetable juices. Low-sodium or reduced-sodium tomato sauce and paste. Low-sodium or reduced-sodium canned vegetables.   Fruits All fresh, canned (in natural juice), or frozen fruits.  Protein Products  All fish and seafood.  Dried beans, peas, or lentils. Unsalted nuts and seeds. Unsalted canned beans.  Dairy Low-fat dairy products, such as skim or 1% milk, 2% or reduced-fat cheeses, low-fat ricotta or cottage cheese, or plain low-fat yogurt. Low-sodium or reduced-sodium cheeses.  Fats and Oils Tub margarines without trans fats. Light or reduced-fat mayonnaise and salad dressings (reduced sodium). Avocado. Safflower, olive, or canola oils. Natural peanut or almond butter.  Other Unsalted popcorn and pretzels. The items listed above may not be a complete list of recommended foods or beverages. Contact your dietitian for more options.  ++++++++++++++++++  WHAT FOODS ARE NOT RECOMMENDED? Grains/ White flour or wheat flour White bread. White  pasta. White rice. Refined cornbread. Bagels and croissants. Crackers that contain trans fat.  Vegetables  Creamed or fried vegetables. Vegetables in a . Regular canned vegetables. Regular canned tomato sauce and paste. Regular tomato and vegetable juices.  Fruits Dried fruits. Canned fruit in light or heavy syrup. Fruit juice.  Meat and Other Protein Products Meat in general - RED meat & White meat.  Fatty cuts of meat. Ribs, chicken wings, all processed meats as bacon, sausage, bologna, salami, fatback, hot dogs, bratwurst and packaged luncheon meats.  Dairy Whole or 2% milk, cream, half-and-half, and cream cheese. Whole-fat or sweetened yogurt. Full-fat cheeses or blue cheese. Non-dairy creamers and whipped toppings. Processed cheese,  cheese spreads, or cheese curds.  Condiments Onion and garlic salt, seasoned salt, table salt, and sea salt. Canned and packaged gravies. Worcestershire sauce. Tartar sauce. Barbecue sauce. Teriyaki sauce. Soy sauce, including reduced sodium. Steak sauce. Fish sauce. Oyster sauce. Cocktail sauce. Horseradish. Ketchup and mustard. Meat flavorings and tenderizers. Bouillon cubes. Hot sauce. Tabasco sauce. Marinades. Taco seasonings. Relishes.  Fats and Oils Butter, stick margarine, lard, shortening and bacon fat. Coconut, palm kernel, or palm oils. Regular salad dressings.  Pickles and olives. Salted popcorn and pretzels.  The items listed above may not be a complete list of foods and beverages to avoid.

## 2019-05-27 ENCOUNTER — Encounter: Payer: Self-pay | Admitting: Adult Health Nurse Practitioner

## 2019-05-27 ENCOUNTER — Other Ambulatory Visit: Payer: Self-pay

## 2019-05-27 ENCOUNTER — Ambulatory Visit: Payer: BC Managed Care – PPO | Admitting: Adult Health Nurse Practitioner

## 2019-05-27 VITALS — BP 126/76 | HR 67 | Temp 97.7°F | Ht 63.5 in | Wt 182.0 lb

## 2019-05-27 DIAGNOSIS — Z131 Encounter for screening for diabetes mellitus: Secondary | ICD-10-CM | POA: Diagnosis not present

## 2019-05-27 DIAGNOSIS — Z0001 Encounter for general adult medical examination with abnormal findings: Secondary | ICD-10-CM

## 2019-05-27 DIAGNOSIS — E538 Deficiency of other specified B group vitamins: Secondary | ICD-10-CM

## 2019-05-27 DIAGNOSIS — Z136 Encounter for screening for cardiovascular disorders: Secondary | ICD-10-CM

## 2019-05-27 DIAGNOSIS — E559 Vitamin D deficiency, unspecified: Secondary | ICD-10-CM | POA: Diagnosis not present

## 2019-05-27 DIAGNOSIS — I10 Essential (primary) hypertension: Secondary | ICD-10-CM | POA: Diagnosis not present

## 2019-05-27 DIAGNOSIS — Z79899 Other long term (current) drug therapy: Secondary | ICD-10-CM | POA: Diagnosis not present

## 2019-05-27 DIAGNOSIS — E785 Hyperlipidemia, unspecified: Secondary | ICD-10-CM

## 2019-05-27 DIAGNOSIS — Z130 Encounter for screening for diseases of the blood and blood-forming organs and certain disorders involving the immune mechanism: Secondary | ICD-10-CM

## 2019-05-27 DIAGNOSIS — J45909 Unspecified asthma, uncomplicated: Secondary | ICD-10-CM

## 2019-05-27 DIAGNOSIS — Z0000 Encounter for general adult medical examination without abnormal findings: Secondary | ICD-10-CM | POA: Diagnosis not present

## 2019-05-27 DIAGNOSIS — R8290 Unspecified abnormal findings in urine: Secondary | ICD-10-CM

## 2019-05-27 DIAGNOSIS — Z1329 Encounter for screening for other suspected endocrine disorder: Secondary | ICD-10-CM

## 2019-05-27 DIAGNOSIS — Z13220 Encounter for screening for lipoid disorders: Secondary | ICD-10-CM | POA: Diagnosis not present

## 2019-05-27 DIAGNOSIS — Z23 Encounter for immunization: Secondary | ICD-10-CM

## 2019-05-27 DIAGNOSIS — E66811 Obesity, class 1: Secondary | ICD-10-CM

## 2019-05-27 DIAGNOSIS — Z1389 Encounter for screening for other disorder: Secondary | ICD-10-CM

## 2019-05-27 DIAGNOSIS — F419 Anxiety disorder, unspecified: Secondary | ICD-10-CM

## 2019-05-27 DIAGNOSIS — E6609 Other obesity due to excess calories: Secondary | ICD-10-CM

## 2019-05-27 DIAGNOSIS — G43809 Other migraine, not intractable, without status migrainosus: Secondary | ICD-10-CM

## 2019-05-27 MED ORDER — ESCITALOPRAM OXALATE 20 MG PO TABS: ORAL_TABLET | ORAL | 0 refills | Status: DC

## 2019-05-28 ENCOUNTER — Other Ambulatory Visit: Payer: Self-pay | Admitting: Adult Health Nurse Practitioner

## 2019-05-28 DIAGNOSIS — E559 Vitamin D deficiency, unspecified: Secondary | ICD-10-CM

## 2019-05-28 LAB — COMPLETE METABOLIC PANEL WITH GFR
AG Ratio: 1.7 (calc) (ref 1.0–2.5)
ALT: 12 U/L (ref 6–29)
AST: 12 U/L (ref 10–35)
Albumin: 4.1 g/dL (ref 3.6–5.1)
Alkaline phosphatase (APISO): 92 U/L (ref 37–153)
BUN: 13 mg/dL (ref 7–25)
CO2: 26 mmol/L (ref 20–32)
Calcium: 9.5 mg/dL (ref 8.6–10.4)
Chloride: 108 mmol/L (ref 98–110)
Creat: 0.89 mg/dL (ref 0.50–1.05)
GFR, Est African American: 86 mL/min/{1.73_m2} (ref >=60)
GFR, Est Non African American: 74 mL/min/{1.73_m2} (ref >=60)
Globulin: 2.4 g/dL (calc) (ref 1.9–3.7)
Glucose, Bld: 97 mg/dL (ref 65–99)
Potassium: 4.1 mmol/L (ref 3.5–5.3)
Sodium: 142 mmol/L (ref 135–146)
Total Bilirubin: 0.3 mg/dL (ref 0.2–1.2)
Total Protein: 6.5 g/dL (ref 6.1–8.1)

## 2019-05-28 LAB — CBC WITH DIFFERENTIAL/PLATELET
Absolute Monocytes: 578 cells/uL (ref 200–950)
Basophils Absolute: 60 cells/uL (ref 0–200)
Basophils Relative: 0.7 %
Eosinophils Absolute: 179 cells/uL (ref 15–500)
Eosinophils Relative: 2.1 %
HCT: 41.8 % (ref 35.0–45.0)
Hemoglobin: 14.2 g/dL (ref 11.7–15.5)
Lymphs Abs: 4616 cells/uL — ABNORMAL HIGH (ref 850–3900)
MCH: 29.2 pg (ref 27.0–33.0)
MCHC: 34 g/dL (ref 32.0–36.0)
MCV: 86 fL (ref 80.0–100.0)
MPV: 9.7 fL (ref 7.5–12.5)
Monocytes Relative: 6.8 %
Neutro Abs: 3069 cells/uL (ref 1500–7800)
Neutrophils Relative %: 36.1 %
Platelets: 267 10*3/uL (ref 140–400)
RBC: 4.86 10*6/uL (ref 3.80–5.10)
RDW: 12.5 % (ref 11.0–15.0)
Total Lymphocyte: 54.3 %
WBC: 8.5 10*3/uL (ref 3.8–10.8)

## 2019-05-28 LAB — URINALYSIS W MICROSCOPIC + REFLEX CULTURE
Bacteria, UA: NONE SEEN /HPF
Bilirubin Urine: NEGATIVE
Glucose, UA: NEGATIVE
Hgb urine dipstick: NEGATIVE
Hyaline Cast: NONE SEEN /LPF
Leukocyte Esterase: NEGATIVE
Nitrites, Initial: NEGATIVE
Protein, ur: NEGATIVE
Specific Gravity, Urine: 1.022 (ref 1.001–1.03)
pH: 6.5 (ref 5.0–8.0)

## 2019-05-28 LAB — INSULIN, RANDOM: Insulin: 2.5 u[IU]/mL

## 2019-05-28 LAB — IRON, TOTAL/TOTAL IRON BINDING CAP
%SAT: 18 % (calc) (ref 16–45)
Iron: 62 ug/dL (ref 45–160)
TIBC: 351 mcg/dL (calc) (ref 250–450)

## 2019-05-28 LAB — HEMOGLOBIN A1C
Hgb A1c MFr Bld: 5.4 % of total Hgb (ref <5.7)
Mean Plasma Glucose: 108 (calc)
eAG (mmol/L): 6 (calc)

## 2019-05-28 LAB — VITAMIN B12: Vitamin B-12: 382 pg/mL (ref 200–1100)

## 2019-05-28 LAB — LIPID PANEL
Cholesterol: 325 mg/dL — ABNORMAL HIGH (ref <200)
HDL: 34 mg/dL — ABNORMAL LOW (ref >=50)
LDL Cholesterol (Calc): 223 mg/dL (calc) — ABNORMAL HIGH
Non-HDL Cholesterol (Calc): 291 mg/dL (calc) — ABNORMAL HIGH (ref <130)
Total CHOL/HDL Ratio: 9.6 (calc) — ABNORMAL HIGH (ref <5.0)
Triglycerides: 399 mg/dL — ABNORMAL HIGH (ref <150)

## 2019-05-28 LAB — MAGNESIUM: Magnesium: 1.9 mg/dL (ref 1.5–2.5)

## 2019-05-28 LAB — NO CULTURE INDICATED

## 2019-05-28 LAB — TSH: TSH: 0.45 mIU/L

## 2019-05-28 LAB — VITAMIN D 25 HYDROXY (VIT D DEFICIENCY, FRACTURES): Vit D, 25-Hydroxy: 22 ng/mL — ABNORMAL LOW (ref 30–100)

## 2019-05-28 MED ORDER — VITAMIN D (ERGOCALCIFEROL) 1.25 MG (50000 UNIT) PO CAPS: ORAL_CAPSULE | ORAL | 1 refills | Status: DC

## 2019-06-04 ENCOUNTER — Other Ambulatory Visit: Payer: Self-pay | Admitting: Physician Assistant

## 2019-07-08 ENCOUNTER — Other Ambulatory Visit: Payer: Self-pay | Admitting: *Deleted

## 2019-07-08 MED ORDER — ESCITALOPRAM OXALATE 20 MG PO TABS: ORAL_TABLET | ORAL | 0 refills | Status: DC

## 2019-08-27 ENCOUNTER — Ambulatory Visit: Payer: BC Managed Care – PPO | Admitting: Internal Medicine

## 2019-08-27 ENCOUNTER — Ambulatory Visit: Payer: BC Managed Care – PPO | Admitting: Adult Health Nurse Practitioner

## 2019-08-27 ENCOUNTER — Encounter: Payer: Self-pay | Admitting: Internal Medicine

## 2019-08-27 ENCOUNTER — Other Ambulatory Visit: Payer: Self-pay

## 2019-08-27 VITALS — BP 102/66 | HR 64 | Temp 96.3°F | Resp 16 | Ht 63.5 in | Wt 180.6 lb

## 2019-08-27 DIAGNOSIS — E559 Vitamin D deficiency, unspecified: Secondary | ICD-10-CM

## 2019-08-27 DIAGNOSIS — Z79899 Other long term (current) drug therapy: Secondary | ICD-10-CM

## 2019-08-27 DIAGNOSIS — R7309 Other abnormal glucose: Secondary | ICD-10-CM | POA: Diagnosis not present

## 2019-08-27 DIAGNOSIS — R0989 Other specified symptoms and signs involving the circulatory and respiratory systems: Secondary | ICD-10-CM | POA: Diagnosis not present

## 2019-08-27 DIAGNOSIS — G43809 Other migraine, not intractable, without status migrainosus: Secondary | ICD-10-CM

## 2019-08-27 DIAGNOSIS — E782 Mixed hyperlipidemia: Secondary | ICD-10-CM

## 2019-08-27 MED ORDER — TOPIRAMATE 100 MG PO TABS: ORAL_TABLET | ORAL | 1 refills | Status: DC

## 2019-08-27 MED ORDER — UBRELVY 50 MG PO TABS: 50.0000 mg | ORAL_TABLET | ORAL | 0 refills | Status: DC | PRN

## 2019-08-27 NOTE — Patient Instructions (Signed)

## 2019-08-27 NOTE — Progress Notes (Signed)
History of Present Illness:      This very nice 54 y.o. single BF presents for 3 month follow up with HTN, HLD, Pre-Diabetes and Vitamin D Deficiency.  Patient also is followed forhx/o Migraine predating since age 63 yo. She has been intolerant to triptans in the past. At last OV , she was given a sx of Ubrelvy 50 mg and used with good response.       Patient is followed expectantly for labile HTN & BP has been controlled at home. Today's BP is at goal.  102/66. Patient has had no complaints of any cardiac type chest pain, palpitations, dyspnea / orthopnea / PND, dizziness, claudication, or dependent edema. BP Readings from Last 3 Encounters:  05/27/19 126/76  12/26/18 132/84  08/16/18 126/88        Patient is Statin Intolerant and her Hyperlipidemia is not controlled with diet & Zetia. Patient denies myalgias or other med SE's. Last Lipids were very elevated & high risk:  Lab Results  Component Value Date   CHOL 325 (H) 05/27/2019   HDL 34 (L) 05/27/2019   LDLCALC 223 (H) 05/27/2019   TRIG 399 (H) 05/27/2019   CHOLHDL 9.6 (H) 05/27/2019        Also, the patient is overweight (BMI 31.5) & is screened expectantly for glucose intolerance and has had no symptoms of reactive hypoglycemia, diabetic polys, paresthesias or visual blurring.  Last A1c was Normal & at goal:  Lab Results  Component Value Date   HGBA1C 5.4 05/27/2019        Further, the patient also has history of Vitamin D Deficiency ("18" / June 2020)  and supplements vitamin D without any suspected side-effects. Last & all vitamin D's have been extremely low:  Lab Results  Component Value Date   VD25OH 22 (L) 05/27/2019    Current Outpatient Medications on File Prior to Visit  Medication Sig  . albuterol (PROVENTIL HFA) 108 (90 Base) MCG/ACT inhaler 2 puffs.  . B Complex Vitamins (VITAMIN-B COMPLEX) TABS Take 1 tablet by mouth.  . Biotin 10 MG CAPS Take by mouth daily.  . cyclobenzaprine (FLEXERIL) 10 MG  tablet TAKE 1 TABLET(10 MG) BY MOUTH AT BEDTIME  . escitalopram (LEXAPRO) 20 MG tablet Take 1/2 to 1 tablet Daily for Mood  . ezetimibe (ZETIA) 10 MG tablet TAKE 1 TABLET(10 MG) BY MOUTH DAILY  . levocetirizine (XYZAL) 5 MG tablet Take 1 tablet Daily for Allergies  . topiramate (TOPAMAX) 25 MG tablet TAKE 1 TABLET(25 MG) BY MOUTH TWICE DAILY  . verapamil (CALAN-SR) 120 MG CR tablet TAKE 1 TABLET BY MOUTH EVERY NIGHT AT BEDTIME  . Vitamin D, Ergocalciferol, (DRISDOL) 1.25 MG (50000 UT) CAPS capsule 1 pill 3 days a week for vitamin d deficiency   No current facility-administered medications on file prior to visit.    Allergies  Allergen Reactions  . Shellfish Allergy Anaphylaxis  . Shellfish-Derived Products Anaphylaxis    PMHx:   Past Medical History:  Diagnosis Date  . Allergy   . Anxiety   . Asthma   . Benign hematuria 2011   Ottelin  . Hyperlipidemia   . Migraine   . Vitamin D deficiency    Immunization History  Administered Date(s) Administered  . DTaP 10/24/2010  . Fluad Quad(high Dose 65+) 05/26/2019  . Influenza Inj Mdck Quad With Preservative 05/02/2017, 05/10/2018  . Influenza Split 05/11/2012, 05/13/2013  . Influenza,inj,Quad PF,6+ Mos 05/26/2019  . Influenza-Unspecified 05/11/2015  .  PPD Test 08/27/2013, 09/23/2014  . Pneumococcal Polysaccharide-23 08/27/2013  . Tdap 10/24/2010   Past Surgical History:  Procedure Laterality Date  . ABDOMINAL HYSTERECTOMY  1998  . LEEP    . TONSILECTOMY/ADENOIDECTOMY WITH MYRINGOTOMY      FHx:    Reviewed / unchanged  SHx:    Reviewed / unchanged   Systems Review:  Constitutional: Denies fever, chills, wt changes, headaches, insomnia, fatigue, night sweats, change in appetite. Eyes: Denies redness, blurred vision, diplopia, discharge, itchy, watery eyes.  ENT: Denies discharge, congestion, post nasal drip, epistaxis, sore throat, earache, hearing loss, dental pain, tinnitus, vertigo, sinus pain, snoring.  CV: Denies  chest pain, palpitations, irregular heartbeat, syncope, dyspnea, diaphoresis, orthopnea, PND, claudication or edema. Respiratory: denies cough, dyspnea, DOE, pleurisy, hoarseness, laryngitis, wheezing.  Gastrointestinal: Denies dysphagia, odynophagia, heartburn, reflux, water brash, abdominal pain or cramps, nausea, vomiting, bloating, diarrhea, constipation, hematemesis, melena, hematochezia  or hemorrhoids. Genitourinary: Denies dysuria, frequency, urgency, nocturia, hesitancy, discharge, hematuria or flank pain. Musculoskeletal: Denies arthralgias, myalgias, stiffness, jt. swelling, pain, limping or strain/sprain.  Skin: Denies pruritus, rash, hives, warts, acne, eczema or change in skin lesion(s). Neuro: No weakness, tremor, incoordination, spasms, paresthesia or pain. Psychiatric: Denies confusion, memory loss or sensory loss. Endo: Denies change in weight, skin or hair change.  Heme/Lymph: No excessive bleeding, bruising or enlarged lymph nodes.  Physical Exam  BP 102/66   Pulse 64   Temp (!) 96.3 F (35.7 C)   Resp 16   Ht 5' 3.5" (1.613 m)   Wt 180 lb 9.6 oz (81.9 kg)   BMI 31.49 kg/m   Appears  well nourished, well groomed  and in no distress.  Eyes: PERRLA, EOMs, conjunctiva no swelling or erythema. Sinuses: No frontal/maxillary tenderness ENT/Mouth: EAC's clear, TM's nl w/o erythema, bulging. Nares clear w/o erythema, swelling, exudates. Oropharynx clear without erythema or exudates. Oral hygiene is good. Tongue normal, non obstructing. Hearing intact.  Neck: Supple. Thyroid not palpable. Car 2+/2+ without bruits, nodes or JVD. Chest: Respirations nl with BS clear & equal w/o rales, rhonchi, wheezing or stridor.  Cor: Heart sounds normal w/ regular rate and rhythm without sig. murmurs, gallops, clicks or rubs. Peripheral pulses normal and equal  without edema.  Abdomen: Soft & bowel sounds normal. Non-tender w/o guarding, rebound, hernias, masses or organomegaly.    Lymphatics: Unremarkable.  Musculoskeletal: Full ROM all peripheral extremities, joint stability, 5/5 strength and normal gait.  Skin: Warm, dry without exposed rashes, lesions or ecchymosis apparent.  Neuro: Cranial nerves intact, reflexes equal bilaterally. Sensory-motor testing grossly intact. Tendon reflexes grossly intact.  Pysch: Alert & oriented x 3.  Insight and judgement nl & appropriate. No ideations.  Assessment and Plan:  - Continue medication, monitor blood pressure at home.  - Continue DASH diet.  Reminder to go to the ER if any CP,  SOB, nausea, dizziness, severe HA, changes vision/speech.  - Continue diet/meds, exercise,& lifestyle modifications.  - Continue monitor periodic cholesterol/liver & renal functions    - Continue diet, exercise  - Lifestyle modifications.  - Monitor appropriate labs. - Continue supplementation.       Discussed  regular exercise, BP monitoring, weight control to achieve/maintain BMI less than 25 and discussed med and SE's. Recommended labs to assess and monitor clinical status with further disposition pending results of labs.  I discussed the assessment and treatment plan with the patient. The patient was provided an opportunity to ask questions and all were answered. The patient agreed with the plan  and demonstrated an understanding of the instructions.  I provided over 30 minutes of exam, counseling, chart review and  complex critical decision making.  Kirtland Bouchard, MD

## 2019-08-28 LAB — COMPLETE METABOLIC PANEL WITH GFR
AG Ratio: 1.8 (calc) (ref 1.0–2.5)
ALT: 10 U/L (ref 6–29)
AST: 12 U/L (ref 10–35)
Albumin: 4.2 g/dL (ref 3.6–5.1)
Alkaline phosphatase (APISO): 85 U/L (ref 37–153)
BUN: 11 mg/dL (ref 7–25)
CO2: 26 mmol/L (ref 20–32)
Calcium: 9.1 mg/dL (ref 8.6–10.4)
Chloride: 107 mmol/L (ref 98–110)
Creat: 0.63 mg/dL (ref 0.50–1.05)
GFR, Est African American: 118 mL/min/{1.73_m2} (ref >=60)
GFR, Est Non African American: 102 mL/min/{1.73_m2} (ref >=60)
Globulin: 2.4 g/dL (calc) (ref 1.9–3.7)
Glucose, Bld: 81 mg/dL (ref 65–99)
Potassium: 3.7 mmol/L (ref 3.5–5.3)
Sodium: 141 mmol/L (ref 135–146)
Total Bilirubin: 0.4 mg/dL (ref 0.2–1.2)
Total Protein: 6.6 g/dL (ref 6.1–8.1)

## 2019-08-28 LAB — CBC WITH DIFFERENTIAL/PLATELET
Absolute Monocytes: 670 cells/uL (ref 200–950)
Basophils Absolute: 56 cells/uL (ref 0–200)
Basophils Relative: 0.6 %
Eosinophils Absolute: 167 cells/uL (ref 15–500)
Eosinophils Relative: 1.8 %
HCT: 42.3 % (ref 35.0–45.0)
Hemoglobin: 14.4 g/dL (ref 11.7–15.5)
Lymphs Abs: 5478 cells/uL — ABNORMAL HIGH (ref 850–3900)
MCH: 29.3 pg (ref 27.0–33.0)
MCHC: 34 g/dL (ref 32.0–36.0)
MCV: 86 fL (ref 80.0–100.0)
MPV: 9.4 fL (ref 7.5–12.5)
Monocytes Relative: 7.2 %
Neutro Abs: 2930 cells/uL (ref 1500–7800)
Neutrophils Relative %: 31.5 %
Platelets: 296 10*3/uL (ref 140–400)
RBC: 4.92 10*6/uL (ref 3.80–5.10)
RDW: 12.4 % (ref 11.0–15.0)
Total Lymphocyte: 58.9 %
WBC: 9.3 10*3/uL (ref 3.8–10.8)

## 2019-08-28 LAB — LIPID PANEL
Cholesterol: 353 mg/dL — ABNORMAL HIGH (ref <200)
HDL: 40 mg/dL — ABNORMAL LOW (ref >=50)
LDL Cholesterol (Calc): 283 mg/dL (calc) — ABNORMAL HIGH
Non-HDL Cholesterol (Calc): 313 mg/dL (calc) — ABNORMAL HIGH (ref <130)
Total CHOL/HDL Ratio: 8.8 (calc) — ABNORMAL HIGH (ref <5.0)
Triglycerides: 146 mg/dL (ref <150)

## 2019-08-28 LAB — HEMOGLOBIN A1C
Hgb A1c MFr Bld: 5.5 % of total Hgb (ref <5.7)
Mean Plasma Glucose: 111 (calc)
eAG (mmol/L): 6.2 (calc)

## 2019-08-28 LAB — TSH: TSH: 0.31 mIU/L — ABNORMAL LOW

## 2019-08-28 LAB — VITAMIN D 25 HYDROXY (VIT D DEFICIENCY, FRACTURES): Vit D, 25-Hydroxy: 130 ng/mL — ABNORMAL HIGH (ref 30–100)

## 2019-08-28 LAB — INSULIN, RANDOM: Insulin: 3.1 u[IU]/mL

## 2019-08-28 LAB — MAGNESIUM: Magnesium: 1.9 mg/dL (ref 1.5–2.5)

## 2019-08-28 MED ORDER — LIVALO 4 MG PO TABS: ORAL_TABLET | ORAL | 1 refills | Status: DC

## 2019-08-28 NOTE — Addendum Note (Signed)
Addended by: Unk Pinto on: 08/28/2019 08:39 PM   Modules accepted: Orders

## 2019-09-04 ENCOUNTER — Telehealth: Payer: Self-pay | Admitting: *Deleted

## 2019-09-04 NOTE — Telephone Encounter (Signed)
[place] approved from 09/04/2019 to 09/03/2020, [facility]. Approval notice faxed to [phone]).

## 2019-10-07 ENCOUNTER — Other Ambulatory Visit: Payer: Self-pay | Admitting: Internal Medicine

## 2019-10-07 DIAGNOSIS — F419 Anxiety disorder, unspecified: Secondary | ICD-10-CM

## 2019-10-07 MED ORDER — ESCITALOPRAM OXALATE 20 MG PO TABS: ORAL_TABLET | ORAL | 0 refills | Status: DC

## 2019-10-09 NOTE — Progress Notes (Signed)
Assessment and Plan:  Kimberly Zhang was seen today for chest pain and stress.  Diagnoses and all orders for this visit:  Chest tightness -     EKG 12-Lead -     CBC with Differential/Platelet -     COMPLETE METABOLIC PANEL WITH GFR -     Troponin I - Related to anxiety /MSK? Discussed ibuprofen 600mg  Q6 hours Dicussed hospital precautions with patient  Stress at work Discussed coping methods Increase physical exercise  Labile hypertension Controlled today Continue to monitor  Anxiety tension state Taking Lexapro 20mg  half tablet Discussed increasing to whole tablet? Related to reactionary stress r/t to recent family losses? Increased stress at work      Further disposition pending results of labs. Discussed med's effects and SE's.   Over 30 minutes of face to face interview, exam, counseling, chart review, and critical decision making was performed.   Future Appointments  Date Time Provider [place]  10/10/2019 11:30 AM Garnet Sierras, NP [facility] None  11/28/2019  3:30 PM Liane Comber, NP [facility] None  05/27/2020  3:00 PM Vicie Mutters, PA-C [facility] None    ------------------------------------------------------------------------------------------------------------------   HPI 54 y.o.female presents for evaluation of chest tightening.  It started on Friday of last week and she was sitting.  She feels a tightening on the left side of her chest that is constant.  She reports that the duration is 15-19min and then acompanies this with palpataions.  She reports she will feel weak or drained.  Reports that her right arm is tender or sore to touch and movement.  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  They were moving and packaging boxes.  She was last seen 08/27/19 for routine visit.  She has history of HTN, HLD, abnormal glucose, weight, vitamin D deficiency and  migraines.  She reports she has had multiple deaths within her family over the course of the past few months.  She reports adequate family support.  She also reports increase in stress at work combined with Teresita restrictions and constant change has complied this.  She reports she used to do a group exercise via zoom with friends.  She has not been exercising and walking as regularly as in the past.  She is tearful with conversation.  Discussed restarting these healthy habits at length.  She is very active within the community outside of work.     Past Medical History:  Diagnosis Date  . Allergy   . Anxiety   . Asthma   . Benign hematuria 2011   Ottelin  . Hyperlipidemia   . Migraine   . Vitamin D deficiency      Allergies  Allergen Reactions  . Shellfish Allergy Anaphylaxis  . Shellfish-Derived Products Anaphylaxis    Current Outpatient Medications on File Prior to Visit  Medication Sig  . albuterol (PROVENTIL HFA) 108 (90 Base) MCG/ACT inhaler 2 puffs.  . B Complex Vitamins (VITAMIN-B COMPLEX) TABS Take 1 tablet by mouth.  . Biotin 10 MG CAPS Take by mouth daily.  . cyclobenzaprine (FLEXERIL) 10 MG tablet TAKE 1 TABLET(10 MG) BY MOUTH AT BEDTIME  . escitalopram (LEXAPRO) 20 MG tablet Take 1/2 to 1 tablet Daily for Mood  . ezetimibe (ZETIA) 10 MG tablet TAKE 1 TABLET(10 MG) BY MOUTH DAILY  . levocetirizine (XYZAL) 5 MG tablet Take 1 tablet Daily for Allergies  . Pitavastatin Calcium (LIVALO) 4 MG TABS Take  1 tablet Daily for Cholesterol  . topiramate (TOPAMAX) 100 MG tablet Take 1 tablet at Bedtime for Migraine Prevention & Weight Loss  . Ubrogepant (UBRELVY) 50 MG TABS Take 50 mg by mouth as needed. Take 1 tablet Stat for severe Migraine & may repeat 1 x in 2 hours  (Maximum 2 tabs / 24 hours)  . verapamil (CALAN-SR) 120 MG CR tablet TAKE 1 TABLET BY MOUTH EVERY NIGHT AT BEDTIME  . Vitamin D, Ergocalciferol, (DRISDOL) 1.25 MG (50000 UT) CAPS capsule 1 pill 3 days a week for  vitamin d deficiency   No current facility-administered medications on file prior to visit.    ROS: all negative except above.   Physical Exam:  There were no vitals taken for this visit.  General Appearance: Well nourished, in no apparent distress. Eyes: PERRLA, EOMs, conjunctiva no swelling or erythema Sinuses: No Frontal/maxillary tenderness ENT/Mouth: Ext aud canals clear, TMs without erythema, bulging. No erythema, swelling, or exudate on post pharynx.  Tonsils not swollen or erythematous. Hearing normal.  Neck: Supple, thyroid normal.  Respiratory: Respiratory effort normal, BS equal bilaterally without rales, rhonchi, wheezing or stridor.  Cardio: RRR with no MRGs. Brisk peripheral pulses without edema.  Abdomen: Soft, + BS.  Non tender, no guarding, rebound, hernias, masses. Lymphatics: Non tender without lymphadenopathy.  Musculoskeletal: Full ROM, 5/5 strength, normal gait.  Skin: Warm, dry without rashes, lesions, ecchymosis.  Neuro: Cranial nerves intact. Normal muscle tone, no cerebellar symptoms. Sensation intact.  Psych: Awake and oriented X 3, normal affect, Insight and Judgment appropriate.    EKG: NSR No ST changes.   Garnet Sierras, NP 1:00 PM [facility]

## 2019-10-10 ENCOUNTER — Encounter: Payer: Self-pay | Admitting: Adult Health Nurse Practitioner

## 2019-10-10 ENCOUNTER — Ambulatory Visit (INDEPENDENT_AMBULATORY_CARE_PROVIDER_SITE_OTHER): Payer: BC Managed Care – PPO | Admitting: Adult Health Nurse Practitioner

## 2019-10-10 ENCOUNTER — Other Ambulatory Visit: Payer: Self-pay

## 2019-10-10 VITALS — BP 124/76 | HR 64 | Temp 97.7°F | Wt 179.0 lb

## 2019-10-10 DIAGNOSIS — F419 Anxiety disorder, unspecified: Secondary | ICD-10-CM | POA: Diagnosis not present

## 2019-10-10 DIAGNOSIS — R0989 Other specified symptoms and signs involving the circulatory and respiratory systems: Secondary | ICD-10-CM | POA: Diagnosis not present

## 2019-10-10 DIAGNOSIS — R0789 Other chest pain: Secondary | ICD-10-CM

## 2019-10-10 DIAGNOSIS — Z566 Other physical and mental strain related to work: Secondary | ICD-10-CM

## 2019-10-10 NOTE — Patient Instructions (Signed)
  We will contact you with your lab results via [place].  Start taking ibuprofen 800mg  every 8 hours.  Monitor your symptoms to see if you can link this situational like we discussed in the appointment.  We will discuss other interventions after receiving labs.  Make time for yourself, rather it is your group exercise, walk after work with loved ones.  Add another measure to help with stress.  Please contact me with any new or worsening symptoms.

## 2019-10-11 LAB — CBC WITH DIFFERENTIAL/PLATELET
Absolute Monocytes: 549 cells/uL (ref 200–950)
Basophils Absolute: 49 cells/uL (ref 0–200)
Basophils Relative: 0.6 %
Eosinophils Absolute: 180 cells/uL (ref 15–500)
Eosinophils Relative: 2.2 %
HCT: 43.3 % (ref 35.0–45.0)
Hemoglobin: 14.3 g/dL (ref 11.7–15.5)
Lymphs Abs: 3944 cells/uL — ABNORMAL HIGH (ref 850–3900)
MCH: 28.2 pg (ref 27.0–33.0)
MCHC: 33 g/dL (ref 32.0–36.0)
MCV: 85.4 fL (ref 80.0–100.0)
MPV: 9.2 fL (ref 7.5–12.5)
Monocytes Relative: 6.7 %
Neutro Abs: 3477 cells/uL (ref 1500–7800)
Neutrophils Relative %: 42.4 %
Platelets: 305 10*3/uL (ref 140–400)
RBC: 5.07 10*6/uL (ref 3.80–5.10)
RDW: 12.3 % (ref 11.0–15.0)
Total Lymphocyte: 48.1 %
WBC: 8.2 10*3/uL (ref 3.8–10.8)

## 2019-10-11 LAB — COMPLETE METABOLIC PANEL WITH GFR
AG Ratio: 1.8 (calc) (ref 1.0–2.5)
ALT: 7 U/L (ref 6–29)
AST: 12 U/L (ref 10–35)
Albumin: 4.1 g/dL (ref 3.6–5.1)
Alkaline phosphatase (APISO): 96 U/L (ref 37–153)
BUN: 11 mg/dL (ref 7–25)
CO2: 24 mmol/L (ref 20–32)
Calcium: 9.9 mg/dL (ref 8.6–10.4)
Chloride: 108 mmol/L (ref 98–110)
Creat: 0.69 mg/dL (ref 0.50–1.05)
GFR, Est African American: 114 mL/min/{1.73_m2} (ref >=60)
GFR, Est Non African American: 99 mL/min/{1.73_m2} (ref >=60)
Globulin: 2.3 g/dL (calc) (ref 1.9–3.7)
Glucose, Bld: 89 mg/dL (ref 65–99)
Potassium: 4.2 mmol/L (ref 3.5–5.3)
Sodium: 139 mmol/L (ref 135–146)
Total Bilirubin: 0.3 mg/dL (ref 0.2–1.2)
Total Protein: 6.4 g/dL (ref 6.1–8.1)

## 2019-10-11 LAB — TROPONIN I: Troponin I: <0.01 ng/mL (ref <=0.0)

## 2019-10-21 ENCOUNTER — Encounter: Payer: Self-pay | Admitting: Adult Health Nurse Practitioner

## 2019-11-25 ENCOUNTER — Encounter: Payer: Self-pay | Admitting: Internal Medicine

## 2019-11-27 NOTE — Progress Notes (Deleted)
Assessment and Plan:   Hypertension -Continue medication, monitor blood pressure at home. Continue DASH diet.  Reminder to go to the ER if any CP, SOB, nausea, dizziness, severe HA, changes vision/speech, left arm numbness and tingling and jaw pain.  Cholesterol -Continue diet and exercise. Check cholesterol.    Obesity with co morbidities - long discussion about weight loss, diet, and exercise   Iron  Recheck with ferritin, get on B!2   Migraines Better controlled, continue the same  Hot flashes Try lexapro  Continue diet and meds as discussed. Further disposition pending results of labs. Over 30 minutes of exam, counseling, chart review, and critical decision making was performed  Future Appointments  Date Time Provider [place]  11/28/2019  3:30 PM Liane Comber, NP [facility] None  01/10/2020 10:30 AM [facility] PRE[facility]IT [facility]  01/24/2020  9:30 AM Irene Shipper, MD [facility]  05/27/2020  3:00 PM Vicie Mutters, PA-C [facility] None    HPI 54 y.o. female  presents for 4 month follow up on hypertension, cholesterol, migraines, and vitamin D deficiency. In addition she has smoking history with hematuria, sent to urology for referral, with benign workup by Dr. Karsten Ro.   She was suppose to start on lexapro for hot flashes/depression but never started.   She has migraines, on topamax   BMI is There is no height or weight on file to calculate BMI., she {HAS HAS [id number] been working on diet and exercise. Wt Readings from Last 3 Encounters:  10/10/19 179 lb (81.2 kg)  08/27/19 180 lb 9.6 oz (81.9 kg)  05/27/19 182 lb (82.6 kg)    Her blood pressure has been controlled at home, today their BP is    She does not workout. She denies chest pain, shortness of breath, dizziness.  She is not on cholesterol medication, she was started on zetia last visit .   Her cholesterol is not at goal. The cholesterol last visit was:   Lab Results  Component  Value Date   CHOL 353 (H) 08/27/2019   HDL 40 (L) 08/27/2019   LDLCALC 283 (H) 08/27/2019   TRIG 146 08/27/2019   CHOLHDL 8.8 (H) 08/27/2019    Last A1C in the office was:  Lab Results  Component Value Date   HGBA1C 5.5 08/27/2019   She states that her migraines are doing well with the flexeril and verapamil, have migraines every few months.   Patient is on Vitamin D supplement, 10,000   Lab Results  Component Value Date   VD25OH 130 (H) 08/27/2019      Current Medications:  Current Outpatient Medications on File Prior to Visit  Medication Sig Dispense Refill  . B Complex Vitamins (VITAMIN-B COMPLEX) TABS Take 1 tablet by mouth.    . Biotin 10 MG CAPS Take by mouth daily.    Marland Kitchen escitalopram (LEXAPRO) 20 MG tablet Take 1/2 to 1 tablet Daily for Mood 90 tablet 0  . ezetimibe (ZETIA) 10 MG tablet TAKE 1 TABLET(10 MG) BY MOUTH DAILY 90 tablet 1  . levocetirizine (XYZAL) 5 MG tablet Take 1 tablet Daily for Allergies 90 tablet 3  . Pitavastatin Calcium (LIVALO) 4 MG TABS Take 1 tablet Daily for Cholesterol 90 tablet 1  . topiramate (TOPAMAX) 100 MG tablet Take 1 tablet at Bedtime for Migraine Prevention & Weight Loss 90 tablet 1  . verapamil (CALAN-SR) 120 MG CR tablet TAKE 1 TABLET BY MOUTH EVERY NIGHT AT BEDTIME 90 tablet 1  . Vitamin D,  Ergocalciferol, (DRISDOL) 1.25 MG (50000 UT) CAPS capsule 1 pill 3 days a week for vitamin d deficiency 36 capsule 1   No current facility-administered medications on file prior to visit.   Medical History:  Past Medical History:  Diagnosis Date  . Allergy   . Anxiety   . Asthma   . Benign hematuria 2011   Ottelin  . Hyperlipidemia   . Migraine   . Vitamin D deficiency    Allergies:  Allergies  Allergen Reactions  . Shellfish Allergy Anaphylaxis  . Shellfish-Derived Products Anaphylaxis     Review of Systems: *** Review of Systems  Constitutional: Negative.  Negative for chills, fever and malaise/fatigue.  HENT: Negative for  congestion, ear discharge, ear pain, hearing loss, nosebleeds, sore throat and tinnitus.   Eyes: Negative for blurred vision, double vision, photophobia, pain, discharge and redness.  Respiratory: Negative.  Negative for shortness of breath and stridor.   Cardiovascular: Negative.  Negative for chest pain.  Gastrointestinal: Positive for constipation. Negative for abdominal pain, blood in stool, diarrhea, heartburn, melena, nausea and vomiting.  Genitourinary: Negative.   Musculoskeletal: Positive for back pain and myalgias. Negative for falls, joint pain and neck pain.  Skin: Negative.   Neurological: Positive for sensory change (intermittent hands and feet at night). Negative for dizziness, tingling, tremors, speech change, focal weakness, seizures, loss of consciousness and headaches.  Psychiatric/Behavioral: Negative.  Negative for suicidal ideas. The patient is not nervous/anxious.   All other systems reviewed and are negative.   Family history- Review and unchanged Social history- Review and unchanged Physical Exam: There were no vitals taken for this visit. Wt Readings from Last 3 Encounters:  10/10/19 179 lb (81.2 kg)  08/27/19 180 lb 9.6 oz (81.9 kg)  05/27/19 182 lb (82.6 kg)   General Appearance: Well nourished, in no apparent distress. Eyes: PERRLA, EOMs, conjunctiva no swelling or erythema,  Right lacrimal tearing.  Sinuses: No Frontal/maxillary tenderness ENT/Mouth: Ext aud canals clear, TMs without erythema, bulging. No erythema, swelling, or exudate on post pharynx. Right gums with mild swelling/erythema and tenderenss to palpation, no palptaed fluctuance. Tonsils not swollen or erythematous. Hearing normal.  Neck: Supple, thyroid normal.  Respiratory: Respiratory effort normal, BS equal bilaterally without rales, rhonchi, wheezing or stridor.  Cardio: RRR with no MRGs. Brisk peripheral pulses without edema.  Abdomen: Soft, + BS,  Non tender, no guarding, rebound,  hernias, masses. Lymphatics: Non tender without lymphadenopathy.  Musculoskeletal: Full ROM, 5/5 strength, Normal gait. Skin: Warm, dry without rashes, lesions, ecchymosis.  Neuro: Cranial nerves intact. Normal muscle tone, no cerebellar symptoms. Psych: Awake and oriented X 3, normal affect, Insight and Judgment appropriate.    Izora Ribas, NP 2:16 PM [facility]

## 2019-11-28 ENCOUNTER — Ambulatory Visit: Payer: BC Managed Care – PPO | Admitting: Adult Health

## 2019-11-29 NOTE — Progress Notes (Signed)
Assessment and Plan:    Hypertension -Continue medication, monitor blood pressure at home. Continue DASH diet.  Reminder to go to the ER if any CP, SOB, nausea, dizziness, severe HA, changes vision/speech, left arm numbness and tingling and jaw pain.  Cholesterol - ? Familial, reports family history of similar severe high cholesterol but NO MI/CVA - SE with atorvastatin/rosuvastatin (also inadequate response), newly on livalo and tolerating well but cost concerns - try prior auth - discussed possible benefit from cardio IQ or coronary calcium score study, if low risk, will not pursue aggressively vs lipid clinic referral  -Continue diet and exercise. Check cholesterol.    Obesity with co morbidities - long discussion about weight loss, diet, and exercise    Migraines Well controlled with topiramate/verapamil, PRN [name]. Continue.  Stress management techniques discussed, increase water, good sleep hygiene discussed, increase exercise, and increase veggies.   Depression/anxiety Newly trialing lexapro 20 mg, at week 8 with some benefit, evaluate max benefit at 12 weeks, she is looking to quit job which is main source of stress Lifestyle discussed: diet/exerise, sleep hygiene, stress management, hydration   Continue diet and meds as discussed. Further disposition pending results of labs. Over 30 minutes of exam, counseling, chart review, and critical decision making was performed  Future Appointments  Date Time Provider [place]  01/10/2020 10:30 AM [facility] PREVISIT RM 51 [facility]  01/24/2020  9:30 AM Irene Shipper, MD [facility]  05/27/2020  3:00 PM Vicie Mutters, PA-C [facility] None    HPI 54 y.o. female  presents for 4 month follow up on hypertension, cholesterol, migraines, and vitamin D deficiency. In addition she has smoking history with hematuria, sent to urology for referral, with benign workup by Dr. Karsten Ro.   She has reported high stress at work,  she is considering leaving, she started lexapro 3/15, taking 20 mg for anxiety and hot flashes, has noted some improvement. She reports does sleep ok.    She has migraines, on topamax and verapamil, have migraines every few months. Was prescribed ubrelvy, resolves headache with 1 tab.   BMI is Body mass index is 30.72 kg/m., she has been working on diet and exercise, has been walking.  Wt Readings from Last 3 Encounters:  12/02/19 176 lb 3.2 oz (79.9 kg)  10/10/19 179 lb (81.2 kg)  08/27/19 180 lb 9.6 oz (81.9 kg)    Her blood pressure has been controlled at home, today their BP is BP: 102/72  She does workout. She denies chest pain, shortness of breath, dizziness.   She has long history of severe cholesterol elevation, suspected familial as several members with similar severe elevations, however no MI/CVA hx, is on cholesterol medication, she is curon zetia and newly on livalo 4 mg (very expensive - paid $600 per patient), had GI SE with rosuvastatin and atorvastatin.  Her cholesterol is not at goal. The cholesterol last visit was:   Lab Results  Component Value Date   CHOL 353 (H) 08/27/2019   HDL 40 (L) 08/27/2019   LDLCALC 283 (H) 08/27/2019   TRIG 146 08/27/2019   CHOLHDL 8.8 (H) 08/27/2019    Last A1C in the office was:  Lab Results  Component Value Date   HGBA1C 5.5 08/27/2019   Patient is on Vitamin D supplement, she believes was waking 50000 daily, has reduced to twice a week  Lab Results  Component Value Date   VD25OH 130 (H) 08/27/2019        Current Medications:  Current Outpatient Medications on File Prior to Visit  Medication Sig Dispense Refill  . B Complex Vitamins (VITAMIN-B COMPLEX) TABS Take 1 tablet by mouth.    . Biotin 10 MG CAPS Take by mouth daily.    Marland Kitchen escitalopram (LEXAPRO) 20 MG tablet Take 1/2 to 1 tablet Daily for Mood 90 tablet 0  . ezetimibe (ZETIA) 10 MG tablet TAKE 1 TABLET(10 MG) BY MOUTH DAILY 90 tablet 1  . levocetirizine (XYZAL) 5 MG  tablet Take 1 tablet Daily for Allergies 90 tablet 3  . Pitavastatin Calcium (LIVALO) 4 MG TABS Take 1 tablet Daily for Cholesterol 90 tablet 1  . topiramate (TOPAMAX) 100 MG tablet Take 1 tablet at Bedtime for Migraine Prevention & Weight Loss 90 tablet 1  . Ubrogepant (UBRELVY) 50 MG TABS Take 1 tablet by mouth as needed. For migraine headache    . verapamil (CALAN-SR) 120 MG CR tablet TAKE 1 TABLET BY MOUTH EVERY NIGHT AT BEDTIME 90 tablet 1  . Vitamin D, Ergocalciferol, (DRISDOL) 1.25 MG (50000 UT) CAPS capsule 1 pill 3 days a week for vitamin d deficiency 36 capsule 1   No current facility-administered medications on file prior to visit.   Medical History:  Past Medical History:  Diagnosis Date  . Allergy   . Anxiety   . Asthma   . Benign hematuria 2011   [place]  . Hyperlipidemia   . Migraine   . Vitamin D deficiency    Allergies:  Allergies  Allergen Reactions  . Shellfish Allergy Anaphylaxis  . Shellfish-Derived Products Anaphylaxis  . Atorvastatin     Severe nausea  . Rosuvastatin     Nausea, GI     Review of Systems:  Review of Systems  Constitutional: Negative.  Negative for chills, fever and malaise/fatigue.  HENT: Negative for congestion, ear discharge, ear pain, hearing loss, nosebleeds, sore throat and tinnitus.   Eyes: Negative for blurred vision, double vision, photophobia, pain, discharge and redness.  Respiratory: Negative.  Negative for shortness of breath and stridor.   Cardiovascular: Negative.  Negative for chest pain.  Gastrointestinal: Negative for abdominal pain, blood in stool, constipation, diarrhea, heartburn, melena, nausea and vomiting.  Genitourinary: Negative.   Musculoskeletal: Negative for back pain, falls, joint pain, myalgias and neck pain.  Skin: Negative.   Neurological: Negative for dizziness, tingling, tremors, sensory change, speech change, focal weakness, seizures, loss of consciousness and headaches.  Psychiatric/Behavioral:  Positive for depression. Negative for substance abuse and suicidal ideas. The patient is nervous/anxious. The patient does not have insomnia.   All other systems reviewed and are negative.   Family history- Review and unchanged Social history- Review and unchanged Physical Exam: BP 102/72   Pulse 61   Temp (!) 97.4 F (36.3 C)   Resp 16   Wt 176 lb 3.2 oz (79.9 kg)   SpO2 98%   BMI 30.72 kg/m  Wt Readings from Last 3 Encounters:  12/02/19 176 lb 3.2 oz (79.9 kg)  10/10/19 179 lb (81.2 kg)  08/27/19 180 lb 9.6 oz (81.9 kg)   General Appearance: Well nourished, in no apparent distress. Eyes: PERRLA, EOMs, conjunctiva no swelling or erythema,  Right lacrimal tearing.  Sinuses: No Frontal/maxillary tenderness ENT/Mouth: Ext aud canals clear, TMs without erythema, bulging. No erythema, swelling, or exudate on post pharynx. Tonsils not swollen or erythematous. Hearing normal.  Neck: Supple, thyroid normal.  Respiratory: Respiratory effort normal, BS equal bilaterally without rales, rhonchi, wheezing or stridor.  Cardio: RRR with no MRGs.  Brisk peripheral pulses without edema.  Abdomen: Soft, + BS,  Non tender, no guarding, rebound, hernias, masses. Lymphatics: Non tender without lymphadenopathy.  Musculoskeletal: Full ROM, 5/5 strength, Normal gait. Skin: Warm, dry without rashes, lesions, ecchymosis.  Neuro: Cranial nerves intact. Normal muscle tone, no cerebellar symptoms. Psych: Awake and oriented X 3, depressed affect, Insight and Judgment appropriate.    Izora Ribas, NP 10:53 AM [name]

## 2019-12-02 ENCOUNTER — Other Ambulatory Visit: Payer: Self-pay

## 2019-12-02 ENCOUNTER — Encounter: Payer: Self-pay | Admitting: Adult Health

## 2019-12-02 ENCOUNTER — Ambulatory Visit (INDEPENDENT_AMBULATORY_CARE_PROVIDER_SITE_OTHER): Payer: BC Managed Care – PPO | Admitting: Adult Health

## 2019-12-02 VITALS — BP 102/72 | HR 61 | Temp 97.4°F | Resp 16 | Wt 176.2 lb

## 2019-12-02 DIAGNOSIS — G43809 Other migraine, not intractable, without status migrainosus: Secondary | ICD-10-CM

## 2019-12-02 DIAGNOSIS — F419 Anxiety disorder, unspecified: Secondary | ICD-10-CM | POA: Diagnosis not present

## 2019-12-02 DIAGNOSIS — E785 Hyperlipidemia, unspecified: Secondary | ICD-10-CM

## 2019-12-02 DIAGNOSIS — R0989 Other specified symptoms and signs involving the circulatory and respiratory systems: Secondary | ICD-10-CM | POA: Diagnosis not present

## 2019-12-02 DIAGNOSIS — J45909 Unspecified asthma, uncomplicated: Secondary | ICD-10-CM | POA: Diagnosis not present

## 2019-12-02 DIAGNOSIS — E559 Vitamin D deficiency, unspecified: Secondary | ICD-10-CM

## 2019-12-02 DIAGNOSIS — E669 Obesity, unspecified: Secondary | ICD-10-CM

## 2019-12-02 DIAGNOSIS — Z79899 Other long term (current) drug therapy: Secondary | ICD-10-CM

## 2019-12-02 DIAGNOSIS — D72820 Lymphocytosis (symptomatic): Secondary | ICD-10-CM

## 2019-12-02 LAB — COMPLETE METABOLIC PANEL WITH GFR
AG Ratio: 1.9 (calc) (ref 1.0–2.5)
ALT: 11 U/L (ref 6–29)
AST: 11 U/L (ref 10–35)
Albumin: 4.1 g/dL (ref 3.6–5.1)
Alkaline phosphatase (APISO): 99 U/L (ref 37–153)
BUN: 9 mg/dL (ref 7–25)
CO2: 27 mmol/L (ref 20–32)
Calcium: 9.8 mg/dL (ref 8.6–10.4)
Chloride: 108 mmol/L (ref 98–110)
Creat: 0.74 mg/dL (ref 0.50–1.05)
GFR, Est African American: 106 mL/min/{1.73_m2} (ref >=60)
GFR, Est Non African American: 92 mL/min/{1.73_m2} (ref >=60)
Globulin: 2.2 g/dL (calc) (ref 1.9–3.7)
Glucose, Bld: 88 mg/dL (ref 65–99)
Potassium: 4.4 mmol/L (ref 3.5–5.3)
Sodium: 141 mmol/L (ref 135–146)
Total Bilirubin: 0.4 mg/dL (ref 0.2–1.2)
Total Protein: 6.3 g/dL (ref 6.1–8.1)

## 2019-12-02 LAB — CBC WITH DIFFERENTIAL/PLATELET
Absolute Monocytes: 577 cells/uL (ref 200–950)
Basophils Absolute: 62 cells/uL (ref 0–200)
Basophils Relative: 0.8 %
Eosinophils Absolute: 148 cells/uL (ref 15–500)
Eosinophils Relative: 1.9 %
HCT: 42.9 % (ref 35.0–45.0)
Hemoglobin: 14.3 g/dL (ref 11.7–15.5)
Lymphs Abs: 3799 cells/uL (ref 850–3900)
MCH: 28.4 pg (ref 27.0–33.0)
MCHC: 33.3 g/dL (ref 32.0–36.0)
MCV: 85.3 fL (ref 80.0–100.0)
MPV: 9.6 fL (ref 7.5–12.5)
Monocytes Relative: 7.4 %
Neutro Abs: 3214 cells/uL (ref 1500–7800)
Neutrophils Relative %: 41.2 %
Platelets: 261 10*3/uL (ref 140–400)
RBC: 5.03 10*6/uL (ref 3.80–5.10)
RDW: 12.5 % (ref 11.0–15.0)
Total Lymphocyte: 48.7 %
WBC: 7.8 10*3/uL (ref 3.8–10.8)

## 2019-12-02 LAB — TSH: TSH: 0.52 mIU/L

## 2019-12-02 LAB — LIPID PANEL
Cholesterol: 242 mg/dL — ABNORMAL HIGH (ref <200)
HDL: 39 mg/dL — ABNORMAL LOW (ref >=50)
LDL Cholesterol (Calc): 167 mg/dL (calc) — ABNORMAL HIGH
Non-HDL Cholesterol (Calc): 203 mg/dL (calc) — ABNORMAL HIGH (ref <130)
Total CHOL/HDL Ratio: 6.2 (calc) — ABNORMAL HIGH (ref <5.0)
Triglycerides: 200 mg/dL — ABNORMAL HIGH (ref <150)

## 2019-12-02 LAB — VITAMIN D 25 HYDROXY (VIT D DEFICIENCY, FRACTURES): Vit D, 25-Hydroxy: 52 ng/mL (ref 30–100)

## 2019-12-02 LAB — MAGNESIUM: Magnesium: 1.9 mg/dL (ref 1.5–2.5)

## 2019-12-02 NOTE — Patient Instructions (Addendum)
Goals    . LDL CALC < 130       If livalo works well - will try running through insurance for prior auth, if well controlled will try to stop zetia  IF not improved, consider advanced lipid panel and coronary calcium scan - $99 cash pay via [facility] if you have a high deductible insurance is cheapest around  If no signs of plaque, low risk, will NOT pursue cholesterol aggressively   In the meantime, best diet is whole foods, minimally processed, plant based diet - whole grains, beans, nuts/seeds, fruit, veggies  Avoid things in packages, animal products as much as possible   Avoid packaged foods/oils/products high in trans/saturated fats  Exercise is also helpful   Coronary Calcium Scan A coronary calcium scan is an imaging test used to look for deposits of plaque in the inner lining of the blood vessels of the heart (coronary arteries). Plaque is made up of calcium, protein, and fatty substances. These deposits of plaque can partly clog and narrow the coronary arteries without producing any symptoms or warning signs. This puts a person at risk for a heart attack. This test is recommended for people who are at moderate risk for heart disease. The test can find plaque deposits before symptoms develop. Tell a health care provider about:  Any allergies you have.  All medicines you are taking, including vitamins, herbs, eye drops, creams, and over-the-counter medicines.  Any problems you or family members have had with anesthetic medicines.  Any blood disorders you have.  Any surgeries you have had.  Any medical conditions you have.  Whether you are pregnant or may be pregnant. What are the risks? Generally, this is a safe procedure. However, problems may occur, including:  Harm to a pregnant woman and her unborn baby. This test involves the use of radiation. Radiation exposure can be dangerous to a pregnant woman and her unborn baby. If you are pregnant or think you may be  pregnant, you should not have this procedure done.  Slight increase in the risk of cancer. This is because of the radiation involved in the test. What happens before the procedure? Ask your health care provider for any specific instructions on how to prepare for this procedure. You may be asked to avoid products that contain caffeine, tobacco, or nicotine for 4 hours before the procedure. What happens during the procedure?   You will undress and remove any jewelry from your neck or chest.  You will put on a hospital gown.  Sticky electrodes will be placed on your chest. The electrodes will be connected to an electrocardiogram (ECG) machine to record a tracing of the electrical activity of your heart.  You will lie down on a curved bed that is attached to the [place].  You may be given medicine to slow down your heart rate so that clear pictures can be created.  You will be moved into the CT scanner, and the CT scanner will take pictures of your heart. During this time, you will be asked to lie still and hold your breath for 2-3 seconds at a time while each picture of your heart is being taken. The procedure may vary among health care providers and hospitals. What happens after the procedure?  You can get dressed.  You can return to your normal activities.  It is up to you to get the results of your procedure. Ask your health care provider, or the department that is doing the procedure, when  your results will be ready. Summary  A coronary calcium scan is an imaging test used to look for deposits of plaque in the inner lining of the blood vessels of the heart (coronary arteries). Plaque is made up of calcium, protein, and fatty substances.  Generally, this is a safe procedure. Tell your health care provider if you are pregnant or may be pregnant.  Ask your health care provider for any specific instructions on how to prepare for this procedure.  A CT scanner will take pictures of  your heart.  You can return to your normal activities after the scan is done. This information is not intended to replace advice given to you by your health care provider. Make sure you discuss any questions you have with your health care provider. Document Revised: 01/29/2019 Document Reviewed: 01/29/2019 Elsevier Patient Education  [place].

## 2019-12-24 ENCOUNTER — Other Ambulatory Visit: Payer: Self-pay | Admitting: Adult Health Nurse Practitioner

## 2019-12-24 DIAGNOSIS — E559 Vitamin D deficiency, unspecified: Secondary | ICD-10-CM

## 2020-01-03 ENCOUNTER — Other Ambulatory Visit: Payer: Self-pay | Admitting: *Deleted

## 2020-01-03 DIAGNOSIS — F419 Anxiety disorder, unspecified: Secondary | ICD-10-CM

## 2020-01-03 MED ORDER — ESCITALOPRAM OXALATE 20 MG PO TABS: ORAL_TABLET | ORAL | 0 refills | Status: DC

## 2020-01-10 ENCOUNTER — Ambulatory Visit (AMBULATORY_SURGERY_CENTER): Payer: Self-pay | Admitting: *Deleted

## 2020-01-10 ENCOUNTER — Other Ambulatory Visit: Payer: Self-pay

## 2020-01-10 VITALS — Ht 63.5 in | Wt 174.0 lb

## 2020-01-10 DIAGNOSIS — Z86010 Personal history of colonic polyps: Secondary | ICD-10-CM

## 2020-01-10 MED ORDER — SUTAB 1479-225-188 MG PO TABS: 1.0000 | ORAL_TABLET | ORAL | 0 refills | Status: DC

## 2020-01-10 NOTE — Progress Notes (Signed)
Patient is here in-person for PV. Patient denies any allergies to eggs or soy. Patient denies any problems with anesthesia/sedation. Patient denies any oxygen use at home. Patient denies taking any diet/weight loss medications or blood thinners. Patient is not being treated for MRSA or C-diff. Patient is aware of our care-partner policy and [facility]-67 safety protocol. Completed covid vaccines per pt on 09/09/19 Prep Prescription coupon given to the patient.

## 2020-01-15 ENCOUNTER — Encounter: Payer: Self-pay | Admitting: Internal Medicine

## 2020-01-24 ENCOUNTER — Other Ambulatory Visit: Payer: Self-pay

## 2020-01-24 ENCOUNTER — Encounter: Payer: Self-pay | Admitting: Internal Medicine

## 2020-01-24 ENCOUNTER — Ambulatory Visit (AMBULATORY_SURGERY_CENTER): Payer: BC Managed Care – PPO | Admitting: Internal Medicine

## 2020-01-24 VITALS — BP 126/79 | HR 59 | Temp 96.8°F | Resp 13 | Ht 63.5 in | Wt 174.0 lb

## 2020-01-24 DIAGNOSIS — D123 Benign neoplasm of transverse colon: Secondary | ICD-10-CM

## 2020-01-24 DIAGNOSIS — Z86010 Personal history of colonic polyps: Secondary | ICD-10-CM | POA: Diagnosis not present

## 2020-01-24 MED ORDER — SODIUM CHLORIDE 0.9 % IV SOLN: 500.0000 mL | INTRAVENOUS | Status: DC

## 2020-01-24 NOTE — Progress Notes (Signed)
Called to room to assist during endoscopic procedure.  Patient ID and intended procedure confirmed with present staff. Received instructions for my participation in the procedure from the performing physician.  

## 2020-01-24 NOTE — Patient Instructions (Signed)
YOU HAD AN ENDOSCOPIC PROCEDURE TODAY AT THE [facility]:   Refer to the procedure report that was given to you for any specific questions about what was found during the examination.  If the procedure report does not answer your questions, please call your gastroenterologist to clarify.  If you requested that your care partner not be given the details of your procedure findings, then the procedure report has been included in a sealed envelope for you to review at your convenience later.  YOU SHOULD EXPECT: Some feelings of bloating in the abdomen. Passage of more gas than usual.  Walking can help get rid of the air that was put into your GI tract during the procedure and reduce the bloating. If you had a lower endoscopy (such as a colonoscopy or flexible sigmoidoscopy) you may notice spotting of blood in your stool or on the toilet paper. If you underwent a bowel prep for your procedure, you may not have a normal bowel movement for a few days.  Please Note:  You might notice some irritation and congestion in your nose or some drainage.  This is from the oxygen used during your procedure.  There is no need for concern and it should clear up in a day or so.  SYMPTOMS TO REPORT IMMEDIATELY:   Following lower endoscopy (colonoscopy or flexible sigmoidoscopy):  Excessive amounts of blood in the stool  Significant tenderness or worsening of abdominal pains  Swelling of the abdomen that is new, acute  Fever of 100F or higher  For urgent or emergent issues, a gastroenterologist can be reached at any hour by calling [phone]. Do not use MyChart messaging for urgent concerns.    DIET:  We do recommend a small meal at first, but then you may proceed to your regular diet.  Drink plenty of fluids but you should avoid alcoholic beverages for 24 hours.  ACTIVITY:  You should plan to take it easy for the rest of today and you should NOT DRIVE or use heavy machinery until tomorrow (because  of the sedation medicines used during the test).    FOLLOW UP: Our staff will call the number listed on your records 48-72 hours following your procedure to check on you and address any questions or concerns that you may have regarding the information given to you following your procedure. If we do not reach you, we will leave a message.  We will attempt to reach you two times.  During this call, we will ask if you have developed any symptoms of COVID 19. If you develop any symptoms (ie: fever, flu-like symptoms, shortness of breath, cough etc.) before then, please call [phone].  If you test positive for Covid 19 in the 2 weeks post procedure, please call and report this information to us.    If any biopsies were taken you will be contacted by phone or by letter within the next 1-3 weeks.  Please call us at [phone] if you have not heard about the biopsies in 3 weeks.    SIGNATURES/CONFIDENTIALITY: You and/or your care partner have signed paperwork which will be entered into your electronic medical record.  These signatures attest to the fact that that the information above on your After Visit Summary has been reviewed and is understood.  Full responsibility of the confidentiality of this discharge information lies with you and/or your care-partner. 

## 2020-01-24 NOTE — Progress Notes (Signed)
Report to PACU, RN, vss, BBS= Clear.  

## 2020-01-24 NOTE — Op Note (Signed)
[facility] Patient Name: Myrtha Tonkovich Procedure Date: 01/24/2020 9:51 AM MRN: [id number] Endoscopist: Docia Chuck. Henrene Pastor , MD Age: 54 Referring MD:  Date of Birth: July 27, 1965 Gender: Female Account #: [id number] Procedure:                Colonoscopy with cold snare polypectomy x1 Indications:              High risk colon cancer surveillance: Personal                            history of adenoma (10 mm or greater in size), High                            risk colon cancer surveillance: Personal history of                            adenoma with villous component. Index exam 2017                            with large right colon polyp removed piecemeal.                            Follow-up exam 2018 with negative residual polyp Medicines:                Monitored Anesthesia Care Procedure:                Pre-Anesthesia Assessment:                           - Prior to the procedure, a History and Physical                            was performed, and patient medications and                            allergies were reviewed. The patient's tolerance of                            previous anesthesia was also reviewed. The risks                            and benefits of the procedure and the sedation                            options and risks were discussed with the patient.                            All questions were answered, and informed consent                            was obtained. Prior Anticoagulants: The patient has                            taken no previous anticoagulant or antiplatelet  agents. After reviewing the risks and benefits, the                            patient was deemed in satisfactory condition to                            undergo the procedure.                           After obtaining informed consent, the colonoscope                            was passed under direct vision. Throughout the                             procedure, the patient's blood pressure, pulse, and                            oxygen saturations were monitored continuously. The                            Colonoscope was introduced through the anus and                            advanced to the the cecum, identified by                            appendiceal orifice and ileocecal valve. The                            ileocecal valve, appendiceal orifice, and rectum                            were photographed. The quality of the bowel                            preparation was excellent. The colonoscopy was                            performed without difficulty. The patient tolerated                            the procedure well. The bowel preparation used was                            SUPREP via split dose instruction. Scope In: 10:00:41 AM Scope Out: 10:11:01 AM Scope Withdrawal Time: 0 hours 4 minutes 55 seconds  Total Procedure Duration: 0 hours 10 minutes 20 seconds  Findings:                 A 1 mm polyp was found in the transverse colon. The                            polyp was removed with a cold snare. Resection and  retrieval were complete.                           The exam was otherwise without abnormality on                            direct and retroflexion views. Complications:            No immediate complications. Estimated blood loss:                            None. Estimated Blood Loss:     Estimated blood loss: none. Impression:               - One 1 mm polyp in the transverse colon, removed                            with a cold snare. Resected and retrieved.                           - The examination was otherwise normal on direct                            and retroflexion views. Recommendation:           - Repeat colonoscopy in 5 years for surveillance.                           - Patient has a contact number available for                            emergencies. The signs and  symptoms of potential                            delayed complications were discussed with the                            patient. Return to normal activities tomorrow.                            Written discharge instructions were provided to the                            patient.                           - Resume previous diet.                           - Continue present medications.                           - Await pathology results. Docia Chuck. Henrene Pastor, MD 01/24/2020 10:15:05 AM This report has been signed electronically.

## 2020-01-24 NOTE — Progress Notes (Signed)
Pt's states no medical or surgical changes since previsit or office visit. 

## 2020-01-29 ENCOUNTER — Encounter: Payer: Self-pay | Admitting: Internal Medicine

## 2020-01-29 ENCOUNTER — Telehealth: Payer: Self-pay | Admitting: *Deleted

## 2020-01-29 NOTE — Telephone Encounter (Signed)
  Follow up Call-  Call back number 01/24/2020  Post procedure Call Back phone  # [phone]  Permission to leave phone message Yes  Some recent data might be hidden     Patient questions:  Do you have a fever, pain , or abdominal swelling? No. Pain Score  0 *  Have you tolerated food without any problems? Yes.    Have you been able to return to your normal activities? Yes.    Do you have any questions about your discharge instructions: Diet   No. Medications  No. Follow up visit  No.  Do you have questions or concerns about your Care? No.  Actions: * If pain score is 4 or above: No action needed, pain <4. 1. Have you developed a fever since your procedure? no  2.   Have you had an respiratory symptoms (SOB or cough) since your procedure? no  3.   Have you tested positive for COVID 19 since your procedure no  4.   Have you had any family members/close contacts diagnosed with the COVID 19 since your procedure?  no   If yes to any of these questions please route to Joylene John, RN and Erenest Rasher, RN

## 2020-02-20 ENCOUNTER — Other Ambulatory Visit: Payer: Self-pay | Admitting: Internal Medicine

## 2020-02-20 ENCOUNTER — Telehealth: Payer: Self-pay | Admitting: Adult Health

## 2020-02-20 DIAGNOSIS — G43809 Other migraine, not intractable, without status migrainosus: Secondary | ICD-10-CM

## 2020-02-20 NOTE — Telephone Encounter (Signed)
patient asks if she is supposed to be taking Verapamil. If yes, please send rx to [facility] at [facility]. Or call and advise patient. Rx expired

## 2020-02-21 NOTE — Telephone Encounter (Signed)
Left message to call back  

## 2020-03-20 ENCOUNTER — Other Ambulatory Visit: Payer: Self-pay | Admitting: Internal Medicine

## 2020-03-21 ENCOUNTER — Other Ambulatory Visit: Payer: Self-pay | Admitting: Internal Medicine

## 2020-03-26 ENCOUNTER — Other Ambulatory Visit: Payer: Self-pay

## 2020-03-27 ENCOUNTER — Other Ambulatory Visit: Payer: Self-pay | Admitting: Adult Health

## 2020-03-27 DIAGNOSIS — G43809 Other migraine, not intractable, without status migrainosus: Secondary | ICD-10-CM

## 2020-03-27 MED ORDER — LEVOCETIRIZINE DIHYDROCHLORIDE 5 MG PO TABS: ORAL_TABLET | ORAL | 0 refills | Status: DC

## 2020-03-27 MED ORDER — VERAPAMIL HCL ER 120 MG PO TBCR: 120.0000 mg | EXTENDED_RELEASE_TABLET | Freq: Every day | ORAL | 1 refills | Status: DC

## 2020-04-20 ENCOUNTER — Telehealth: Payer: Self-pay | Admitting: *Deleted

## 2020-04-20 NOTE — Telephone Encounter (Signed)
Negative Lumiradxa Sars-COV-2 AG test.

## 2020-05-15 ENCOUNTER — Other Ambulatory Visit: Payer: Self-pay | Admitting: Internal Medicine

## 2020-05-27 ENCOUNTER — Encounter: Payer: BC Managed Care – PPO | Admitting: Adult Health Nurse Practitioner

## 2020-06-03 ENCOUNTER — Other Ambulatory Visit: Payer: Self-pay

## 2020-06-03 ENCOUNTER — Ambulatory Visit (INDEPENDENT_AMBULATORY_CARE_PROVIDER_SITE_OTHER): Payer: BC Managed Care – PPO | Admitting: Adult Health Nurse Practitioner

## 2020-06-03 ENCOUNTER — Encounter: Payer: Self-pay | Admitting: Adult Health Nurse Practitioner

## 2020-06-03 VITALS — BP 124/74 | HR 82 | Temp 97.5°F | Ht 63.5 in | Wt 176.0 lb

## 2020-06-03 DIAGNOSIS — E559 Vitamin D deficiency, unspecified: Secondary | ICD-10-CM | POA: Diagnosis not present

## 2020-06-03 DIAGNOSIS — Z130 Encounter for screening for diseases of the blood and blood-forming organs and certain disorders involving the immune mechanism: Secondary | ICD-10-CM | POA: Diagnosis not present

## 2020-06-03 DIAGNOSIS — Z0001 Encounter for general adult medical examination with abnormal findings: Secondary | ICD-10-CM

## 2020-06-03 DIAGNOSIS — Z131 Encounter for screening for diabetes mellitus: Secondary | ICD-10-CM | POA: Diagnosis not present

## 2020-06-03 DIAGNOSIS — J301 Allergic rhinitis due to pollen: Secondary | ICD-10-CM

## 2020-06-03 DIAGNOSIS — E66811 Obesity, class 1: Secondary | ICD-10-CM

## 2020-06-03 DIAGNOSIS — T466X5A Adverse effect of antihyperlipidemic and antiarteriosclerotic drugs, initial encounter: Secondary | ICD-10-CM

## 2020-06-03 DIAGNOSIS — G43701 Chronic migraine without aura, not intractable, with status migrainosus: Secondary | ICD-10-CM

## 2020-06-03 DIAGNOSIS — F419 Anxiety disorder, unspecified: Secondary | ICD-10-CM

## 2020-06-03 DIAGNOSIS — Z1329 Encounter for screening for other suspected endocrine disorder: Secondary | ICD-10-CM

## 2020-06-03 DIAGNOSIS — R0989 Other specified symptoms and signs involving the circulatory and respiratory systems: Secondary | ICD-10-CM

## 2020-06-03 DIAGNOSIS — R7309 Other abnormal glucose: Secondary | ICD-10-CM

## 2020-06-03 DIAGNOSIS — Z13220 Encounter for screening for lipoid disorders: Secondary | ICD-10-CM | POA: Diagnosis not present

## 2020-06-03 DIAGNOSIS — Z0000 Encounter for general adult medical examination without abnormal findings: Secondary | ICD-10-CM

## 2020-06-03 DIAGNOSIS — D72820 Lymphocytosis (symptomatic): Secondary | ICD-10-CM

## 2020-06-03 DIAGNOSIS — Z1389 Encounter for screening for other disorder: Secondary | ICD-10-CM | POA: Diagnosis not present

## 2020-06-03 DIAGNOSIS — J45909 Unspecified asthma, uncomplicated: Secondary | ICD-10-CM

## 2020-06-03 DIAGNOSIS — Z23 Encounter for immunization: Secondary | ICD-10-CM

## 2020-06-03 DIAGNOSIS — Z79899 Other long term (current) drug therapy: Secondary | ICD-10-CM

## 2020-06-03 DIAGNOSIS — Z1321 Encounter for screening for nutritional disorder: Secondary | ICD-10-CM

## 2020-06-03 DIAGNOSIS — G43809 Other migraine, not intractable, without status migrainosus: Secondary | ICD-10-CM

## 2020-06-03 DIAGNOSIS — E669 Obesity, unspecified: Secondary | ICD-10-CM

## 2020-06-03 DIAGNOSIS — E785 Hyperlipidemia, unspecified: Secondary | ICD-10-CM

## 2020-06-03 MED ORDER — LEVOCETIRIZINE DIHYDROCHLORIDE 5 MG PO TABS: ORAL_TABLET | ORAL | 0 refills | Status: DC

## 2020-06-03 MED ORDER — TOPIRAMATE 100 MG PO TABS: ORAL_TABLET | ORAL | 1 refills | Status: DC

## 2020-06-03 MED ORDER — UBRELVY 50 MG PO TABS: 50.0000 mg | ORAL_TABLET | ORAL | 1 refills | Status: DC | PRN

## 2020-06-03 MED ORDER — ESCITALOPRAM OXALATE 20 MG PO TABS: ORAL_TABLET | ORAL | 0 refills | Status: DC

## 2020-06-03 NOTE — Progress Notes (Signed)
COMPLETE PHYSICAL  Assessment and Plan:  Encounter for general adult medical examination with abnormal findings 1 year  Hyperlipidemia Continue medications: Zetia 63m.  Has failed statin trials in past.  check lipids, decrease fatty foods, increase activity.  -     Lipid panel [place] 15% cardiovascular risk assessment  Obesity -     TSH - long discussion about weight loss, diet, and exercise  Benign hematuria Check urine  Asthma, unspecified asthma severity, uncomplicated  Vitamin D deficiency Continue supplementation to maintain goal of 70-100 Taking Vitamin D 50,000IU three times a week. Pt prefer prescription, more consistent with taking than OTC. -     VITAMIN D 25 Hydroxy (Vit-D Deficiency, Fractures)  Medication management -     Magnesium  Female genuine stress incontinence Weight loss advised Discussed dietary and exercise modifications  Anxiety Add on lexapro 10-20 mg for hot flashes/anxiety  Screening for diabetes mellitus -     Hemoglobin A1c  Screening for hematuria or proteinuria -     [facility], [facility]     Microalbumin / creatinine urine ratio  Other migraine without status migrainosus, not intractable Taking topiromate daily Using Ubrevly 556mPRN - working well for patient  Needs flu shot -     FLU VACCINE MDCK QUAD W/Preservative   Discussed med's effects and SE's. Screening labs and tests as requested with regular follow-up as recommended. Over 40 minutes of face to face interview, exam, counseling, chart review, and complex, high level critical decision making was performed this visit.   HPI  Kimberly Zhang. female  presents for a complete physical and follow up for has Hyperlipidemia; Anxiety; Asthma; Migraine; Vitamin D deficiency; Benign hematuria; Female genuine stress incontinence; Obesity (BMI 30.0-34.9); Medication management; and Labile hypertension on their problem list..  Her blood pressure has  been controlled at home, today their BP is   She does workout, she walks. She denies chest pain, shortness of breath, dizziness.   She is having hot flashes, states she may have depression. She is not sleeping at night, she is having night sweats as well.   She is on verapamil and topamax 2583mID, have been doing well until the weather changes. She is on xyzal.   She is on cholesterol medication, she was on crestor but stopped related to adverse side effects.  She is taking Zetia 37m9md tolerating well.  [place] risk assessment 15%. Not taking bASA.  Her cholesterol is not at goal. The cholesterol last visit was:   Lab Results  Component Value Date   CHOL 343 (H) 12/26/2018   HDL 37 (L) 12/26/2018   LDLCALC 244 (H) 12/26/2018   TRIG 364 (H) 12/26/2018   CHOLHDL 9.3 (H) 12/26/2018    Last A1C in the office was:  Lab Results  Component Value Date   HGBA1C 5.6 05/10/2018   Patient is on Vitamin D supplement, 5000 IU daily.   Lab Results  Component Value Date   VD25OH 18 (L) 12/26/2018     BMI is There is no height or weight on file to calculate BMI., she is working on diet and exercise.  Wt Readings from Last 3 Encounters:  01/24/20 174 lb (78.9 kg)  01/10/20 174 lb (78.9 kg)  12/02/19 176 lb 3.2 oz (79.9 kg)    Current Medications:  Current Outpatient Medications on File Prior to Visit  Medication Sig Dispense Refill  . B Complex Vitamins (VITAMIN-B COMPLEX) TABS Take 1 tablet by  mouth.    . escitalopram (LEXAPRO) 20 MG tablet Take 1/2 to 1 tablet Daily for Mood 90 tablet 0  . ezetimibe (ZETIA) 10 MG tablet TAKE 1 TABLET(10 MG) BY MOUTH DAILY 90 tablet 1  . levocetirizine (XYZAL) 5 MG tablet Take 1 tablet Daily for Allergies 90 tablet 0  . Sodium Sulfate-Mag Sulfate-KCl (SUTAB) [phone] MG TABS Take 1 kit by mouth as directed. 24 tablet 0  . topiramate (TOPAMAX) 100 MG tablet TAKE 1 TABLET BY MOUTH AT BEDTIME FOR MIGRAINE PREVENTION OR WEIGHT LOSS 90 tablet 1  .  UBRELVY 50 MG TABS TAKE 1 TABLET BY MOUTH IMMEDIATELY FOR SEVERE MIGRAINE AND MAY REPEAT 1 TIME IN 2 HOURS(MAXIMUM OF 2 TABLETS IN 24 HOURS) 10 tablet 1  . verapamil (CALAN-SR) 120 MG CR tablet Take 1 tablet (120 mg total) by mouth at bedtime. 90 tablet 1  . Vitamin D, Ergocalciferol, (DRISDOL) 1.25 MG (50000 UNIT) CAPS capsule TAKE 1 CAPSULE BY MOUTH 3 DAYS A WEEK 36 capsule 1   No current facility-administered medications on file prior to visit.   Allergies:  Allergies  Allergen Reactions  . Shellfish Allergy Anaphylaxis  . Shellfish-Derived Products Anaphylaxis  . Atorvastatin     Severe nausea  . Rosuvastatin     Nausea, GI   Medical History:  She has Hyperlipidemia; Anxiety; Asthma; Migraine; Vitamin D deficiency; Benign hematuria; Female genuine stress incontinence; Obesity (BMI 30.0-34.9); Medication management; and Labile hypertension on their problem list.   Health Maintenance:   Immunization History  Administered Date(s) Administered  . DTaP 10/24/2010  . Fluad Quad(high Dose 65+) 05/26/2019  . Influenza Inj Mdck Quad With Preservative 05/02/2017, 05/10/2018  . Influenza Split 05/11/2012, 05/13/2013  . Influenza,inj,Quad PF,6+ Mos 05/26/2019  . Influenza-Unspecified 05/11/2015  . PFIZER SARS-COV-2 Vaccination 08/19/2019, 09/09/2019  . PPD Test 08/27/2013, 09/23/2014  . Pneumococcal Polysaccharide-23 08/27/2013  . Tdap 10/24/2010   Tetanus: 2012 Pneumovax: 2015 Prevnar 13: due age 3 Flu vaccine: 2018 Zostavax: N/A LMP: s/p TAH Pap:05/02/2017  MGM: 2016, Dr Garwin Brothers  DEXA: N/A Colonoscopy: 10/2016 CT head 2013  Patient Care Team: Unk Pinto, MD as PCP - General (Internal Medicine) Kathie Rhodes, MD (Inactive) as Consulting Physician (Urology)  Surgical History:  She has a past surgical history that includes LEEP; Abdominal hysterectomy (1998); Tonsilectomy/adenoidectomy with myringotomy; Colonoscopy (2018); and Polypectomy. Family History:  Herfamily  history includes Alzheimer's disease in an other family member; Cancer in her maternal aunt and maternal uncle; Depression in an other family member; Diabetes in her paternal grandmother; Healthy in her daughter, sister, and son; Heart disease in her maternal aunt; Hyperlipidemia in her mother; Liver disease in her father; Migraines in her mother. Social History:  She reports that she has been smoking cigarettes. She has never used smokeless tobacco. She reports current alcohol use. She reports that she does not use drugs.  Review of Systems: Review of Systems  Constitutional: Negative.   Eyes: Negative.   Respiratory: Negative.   Cardiovascular: Negative.   Gastrointestinal: Negative.   Genitourinary: Negative.   Musculoskeletal: Negative for joint pain.  Skin: Negative.   Neurological: Positive for headaches. Negative for dizziness, tingling, tremors, sensory change, speech change, focal weakness, seizures and loss of consciousness.  Endo/Heme/Allergies: Negative.   Psychiatric/Behavioral: Negative.     Physical Exam: Estimated body mass index is 30.34 kg/m as calculated from the following:   Height as of 01/24/20: 5' 3.5" (1.613 m).   Weight as of 01/24/20: 174 lb (78.9 kg). There were no  vitals taken for this visit. General Appearance: Well nourished, in no apparent distress.  Eyes: PERRLA, EOMs, conjunctiva no swelling or erythema, normal fundi and vessels.  Sinuses: No Frontal/maxillary tenderness  ENT/Mouth: Ext aud canals clear, normal light reflex with TMs without erythema, bulging. Good dentition. No erythema, swelling, or exudate on post pharynx. Tonsils not swollen or erythematous. Hearing normal.  Neck: Supple, thyroid normal. No bruits  Respiratory: Respiratory effort normal, BS equal bilaterally without rales, rhonchi, wheezing or stridor.  Cardio: RRR without murmurs, rubs or gallops. Brisk peripheral pulses without edema.  Chest: symmetric, with normal excursions and  percussion.  Breasts: Symmetric, without lumps, nipple discharge, retractions.  Abdomen: Soft, nontender, no guarding, rebound, hernias, masses, or organomegaly.  Lymphatics: Non tender without lymphadenopathy.  Genitourinary: defer Musculoskeletal: Full ROM all peripheral extremities,5/5 strength, and normal gait.  Skin: Warm, dry without rashes, lesions, ecchymosis. Skin tags noted, neck.(Make appointment if bothersome for removal). Neuro: Cranial nerves intact, reflexes equal bilaterally. Normal muscle tone, no cerebellar symptoms. Sensation intact.  Psych: Awake and oriented X 3, normal affect, Insight and Judgment appropriate.   EKG: defer 10/11/19, NSR    Bayard Males, DNP [facility] 06/03/2020  12:42 AM

## 2020-06-03 NOTE — Patient Instructions (Addendum)
  You are due for your mammogram, contact [facility] for scheduling.   Try decreasing caffeine in the evenings.   Keep up the good work with your dietary changes and activity.      GENERAL HEALTH GOALS  Know what a healthy weight is for you (roughly BMI <25) and aim to maintain this  Aim for 7+ servings of fruits and vegetables daily  70-80+ fluid ounces of water or unsweet tea for healthy kidneys  Limit to max 1 drink of alcohol per day; avoid smoking/tobacco  Limit animal fats in diet for cholesterol and heart health - choose grass fed whenever available  Avoid highly processed foods, and foods high in saturated/trans fats  Aim for low stress - take time to unwind and care for your mental health  Aim for 150 min of moderate intensity exercise weekly for heart health, and weights twice weekly for bone health  Aim for 7-9 hours of sleep daily

## 2020-06-05 LAB — URINALYSIS W MICROSCOPIC + REFLEX CULTURE
Bacteria, UA: NONE SEEN /HPF
Bilirubin Urine: NEGATIVE
Glucose, UA: NEGATIVE
Hyaline Cast: NONE SEEN /LPF
Ketones, ur: NEGATIVE
Leukocyte Esterase: NEGATIVE
Nitrites, Initial: NEGATIVE
Protein, ur: NEGATIVE
Specific Gravity, Urine: 1.021 (ref 1.001–1.03)
pH: 6.5 (ref 5.0–8.0)

## 2020-06-05 LAB — COMPLETE METABOLIC PANEL WITH GFR
AG Ratio: 1.8 (calc) (ref 1.0–2.5)
ALT: 11 U/L (ref 6–29)
AST: 11 U/L (ref 10–35)
Albumin: 4.2 g/dL (ref 3.6–5.1)
Alkaline phosphatase (APISO): 103 U/L (ref 37–153)
BUN: 10 mg/dL (ref 7–25)
CO2: 25 mmol/L (ref 20–32)
Calcium: 9.7 mg/dL (ref 8.6–10.4)
Chloride: 110 mmol/L (ref 98–110)
Creat: 0.74 mg/dL (ref 0.50–1.05)
GFR, Est African American: 106 mL/min/{1.73_m2} (ref >=60)
GFR, Est Non African American: 92 mL/min/{1.73_m2} (ref >=60)
Globulin: 2.4 g/dL (calc) (ref 1.9–3.7)
Glucose, Bld: 83 mg/dL (ref 65–99)
Potassium: 4.1 mmol/L (ref 3.5–5.3)
Sodium: 142 mmol/L (ref 135–146)
Total Bilirubin: 0.4 mg/dL (ref 0.2–1.2)
Total Protein: 6.6 g/dL (ref 6.1–8.1)

## 2020-06-05 LAB — CBC WITH DIFFERENTIAL/PLATELET
Absolute Monocytes: 537 cells/uL (ref 200–950)
Basophils Absolute: 47 cells/uL (ref 0–200)
Basophils Relative: 0.6 %
Eosinophils Absolute: 111 cells/uL (ref 15–500)
Eosinophils Relative: 1.4 %
HCT: 44.1 % (ref 35.0–45.0)
Hemoglobin: 14.8 g/dL (ref 11.7–15.5)
Lymphs Abs: 3666 cells/uL (ref 850–3900)
MCH: 28.8 pg (ref 27.0–33.0)
MCHC: 33.6 g/dL (ref 32.0–36.0)
MCV: 85.8 fL (ref 80.0–100.0)
MPV: 9.3 fL (ref 7.5–12.5)
Monocytes Relative: 6.8 %
Neutro Abs: 3539 cells/uL (ref 1500–7800)
Neutrophils Relative %: 44.8 %
Platelets: 300 10*3/uL (ref 140–400)
RBC: 5.14 10*6/uL — ABNORMAL HIGH (ref 3.80–5.10)
RDW: 12.2 % (ref 11.0–15.0)
Total Lymphocyte: 46.4 %
WBC: 7.9 10*3/uL (ref 3.8–10.8)

## 2020-06-05 LAB — URINE CULTURE
MICRO NUMBER:: 11187713
Result:: NO GROWTH
SPECIMEN QUALITY:: ADEQUATE

## 2020-06-05 LAB — HEMOGLOBIN A1C
Hgb A1c MFr Bld: 5.5 % of total Hgb (ref <5.7)
Mean Plasma Glucose: 111 (calc)
eAG (mmol/L): 6.2 (calc)

## 2020-06-05 LAB — VITAMIN D 25 HYDROXY (VIT D DEFICIENCY, FRACTURES): Vit D, 25-Hydroxy: 40 ng/mL (ref 30–100)

## 2020-06-05 LAB — LIPID PANEL
Cholesterol: 383 mg/dL — ABNORMAL HIGH (ref <200)
HDL: 37 mg/dL — ABNORMAL LOW (ref >=50)
LDL Cholesterol (Calc): 301 mg/dL (calc) — ABNORMAL HIGH
Non-HDL Cholesterol (Calc): 346 mg/dL (calc) — ABNORMAL HIGH (ref <130)
Total CHOL/HDL Ratio: 10.4 (calc) — ABNORMAL HIGH (ref <5.0)
Triglycerides: 244 mg/dL — ABNORMAL HIGH (ref <150)

## 2020-06-05 LAB — TSH: TSH: 0.57 mIU/L

## 2020-06-05 LAB — MAGNESIUM: Magnesium: 2 mg/dL (ref 1.5–2.5)

## 2020-06-05 LAB — VITAMIN B12: Vitamin B-12: 322 pg/mL (ref 200–1100)

## 2020-06-05 LAB — IRON, TOTAL/TOTAL IRON BINDING CAP
%SAT: 29 % (calc) (ref 16–45)
Iron: 114 ug/dL (ref 45–160)
TIBC: 388 mcg/dL (calc) (ref 250–450)

## 2020-06-05 LAB — CULTURE INDICATED

## 2020-06-29 ENCOUNTER — Other Ambulatory Visit: Payer: Self-pay | Admitting: Adult Health

## 2020-06-29 DIAGNOSIS — G43809 Other migraine, not intractable, without status migrainosus: Secondary | ICD-10-CM

## 2020-08-22 ENCOUNTER — Other Ambulatory Visit: Payer: Self-pay | Admitting: Adult Health

## 2020-08-22 DIAGNOSIS — G43809 Other migraine, not intractable, without status migrainosus: Secondary | ICD-10-CM

## 2020-08-24 ENCOUNTER — Other Ambulatory Visit: Payer: Self-pay | Admitting: Adult Health Nurse Practitioner

## 2020-08-24 DIAGNOSIS — E785 Hyperlipidemia, unspecified: Secondary | ICD-10-CM

## 2020-08-24 MED ORDER — NEXLIZET 180-10 MG PO TABS: 1.0000 | ORAL_TABLET | Freq: Every day | ORAL | 2 refills | Status: DC

## 2020-08-29 ENCOUNTER — Other Ambulatory Visit: Payer: Self-pay | Admitting: Adult Health Nurse Practitioner

## 2020-08-29 DIAGNOSIS — J301 Allergic rhinitis due to pollen: Secondary | ICD-10-CM

## 2020-08-29 DIAGNOSIS — F419 Anxiety disorder, unspecified: Secondary | ICD-10-CM

## 2020-09-04 ENCOUNTER — Other Ambulatory Visit: Payer: Self-pay | Admitting: Internal Medicine

## 2020-09-04 DIAGNOSIS — F419 Anxiety disorder, unspecified: Secondary | ICD-10-CM

## 2020-09-23 ENCOUNTER — Encounter: Payer: Self-pay | Admitting: Adult Health Nurse Practitioner

## 2020-09-23 ENCOUNTER — Ambulatory Visit (INDEPENDENT_AMBULATORY_CARE_PROVIDER_SITE_OTHER): Payer: BC Managed Care – PPO | Admitting: Adult Health Nurse Practitioner

## 2020-09-23 ENCOUNTER — Other Ambulatory Visit: Payer: Self-pay

## 2020-09-23 VITALS — BP 128/76 | HR 73 | Temp 97.3°F | Ht 63.0 in | Wt 175.0 lb

## 2020-09-23 DIAGNOSIS — G43809 Other migraine, not intractable, without status migrainosus: Secondary | ICD-10-CM

## 2020-09-23 DIAGNOSIS — R0989 Other specified symptoms and signs involving the circulatory and respiratory systems: Secondary | ICD-10-CM

## 2020-09-23 DIAGNOSIS — G43701 Chronic migraine without aura, not intractable, with status migrainosus: Secondary | ICD-10-CM

## 2020-09-23 DIAGNOSIS — D72820 Lymphocytosis (symptomatic): Secondary | ICD-10-CM

## 2020-09-23 DIAGNOSIS — M791 Myalgia, unspecified site: Secondary | ICD-10-CM

## 2020-09-23 DIAGNOSIS — F419 Anxiety disorder, unspecified: Secondary | ICD-10-CM | POA: Diagnosis not present

## 2020-09-23 DIAGNOSIS — E785 Hyperlipidemia, unspecified: Secondary | ICD-10-CM | POA: Diagnosis not present

## 2020-09-23 DIAGNOSIS — T466X5A Adverse effect of antihyperlipidemic and antiarteriosclerotic drugs, initial encounter: Secondary | ICD-10-CM

## 2020-09-23 MED ORDER — VERAPAMIL HCL ER 120 MG PO TBCR: EXTENDED_RELEASE_TABLET | ORAL | 3 refills | Status: DC

## 2020-09-23 MED ORDER — TOPIRAMATE 100 MG PO TABS: ORAL_TABLET | ORAL | 3 refills | Status: DC

## 2020-09-23 MED ORDER — UBRELVY 50 MG PO TABS
50.0000 mg | ORAL_TABLET | ORAL | 3 refills | Status: DC | PRN
Start: 2020-09-23 — End: 2021-12-17

## 2020-09-23 MED ORDER — ESCITALOPRAM OXALATE 20 MG PO TABS: ORAL_TABLET | ORAL | 3 refills | Status: DC

## 2020-09-23 MED ORDER — NEXLIZET 180-10 MG PO TABS: 1.0000 | ORAL_TABLET | Freq: Every day | ORAL | 3 refills | Status: DC

## 2020-09-23 NOTE — Progress Notes (Signed)
FOLLOW UP Assessment and Plan:  Kimberly Zhang was seen today for follow-up.  Diagnoses and all orders for this visit:  Hyperlipidemia Myalgias due to statins Continue medications: Zetia 15m.  Has failed statin trials in past.  check lipids, decrease fatty foods, increase activity.  -     Lipid panel [place] 15% cardiovascular risk assessment Tried samples nexlizet, tolerated well  Labile hypertension No medications  Monitor blood pressure at home; call if consistently over 130/80 Continue DASH diet.   Reminder to go to the ER if any CP, SOB, nausea, dizziness, severe HA, changes vision/speech, left arm numbness and tingling and jaw pain.  Other migraine without status migrainosus, not intractable She take topamax for weight, also migraines Ubrevly PRN, working great when needed Also taking Verapamil 1219mdaily  Anxiety Doing well on current regiment Continue: Escitalopram 2083maily Discussed stress management techniques   Discussed good sleep hygiene Discussed increasing physical activity and exercise Increase water intake  Persistent lymphocytosis CK, Sed rate   Vitamin D deficiency Continue supplementation to maintain goal of 70-100 Taking Vitamin D 50,000IU three times a week. Pt prefer prescription, more consistent with taking than OTC. -     VITAMIN D 25 Hydroxy (Vit-D Deficiency, Fractures)  Medication management -     Magnesium    Further disposition pending results if labs check today. Discussed med's effects and SE's.   Over 30 minutes of face to face interview, exam, counseling, chart review, and critical decision making was performed.   Discussed med's effects and SE's. Screening labs and tests as requested with regular follow-up as recommended. Over 40 minutes of face to face interview, exam, counseling, chart review, and complex, high level critical decision making was performed this visit.   HPI  55 30o. female  presents for a complete physical and  follow up for has Hyperlipidemia; Anxiety; Asthma; Migraine; Vitamin D deficiency; Benign hematuria; Female genuine stress incontinence; Obesity (BMI 30.0-34.9); Medication management; and Labile hypertension on their problem list..  Her blood pressure has been controlled at home, today their BP is BP: 128/76 She does workout, she walks. She denies chest pain, shortness of breath, dizziness.   Reports she is doing well overall.  She took samples of nexlizet and tolerated well.  Will send to mail order pharm. She is having hot flashes, states she may have depression. She is not sleeping at night, she is having night sweats as well.   She is on verapamil and topamax 72m79mD, have been doing well until the weather changes. She is on xyzal as needed.  She want to try to taper off of some of these medications.  Discussed doing this one at a time.  She is on cholesterol medication, she was on crestor but stopped related to adverse side effects, myalgias.  She is taking Zetia 10mg11m tolerating well.  [place] risk assessment 15%. Not taking bASA.  Her cholesterol is not at goal. The cholesterol last visit was:   Lab Results  Component Value Date   CHOL 343 (H) 12/26/2018   HDL 37 (L) 12/26/2018   LDLCALC 244 (H) 12/26/2018   TRIG 364 (H) 12/26/2018   CHOLHDL 9.3 (H) 12/26/2018    Last A1C in the office was:  Lab Results  Component Value Date   HGBA1C 5.6 05/10/2018   Patient is on Vitamin D supplement, 5000 IU daily.   Lab Results  Component Value Date   VD25OH 18 (L) 12/26/2018     BMI is Body mass  index is 31 kg/m., she is working on diet and exercise.  Wt Readings from Last 3 Encounters:  09/23/20 175 lb (79.4 kg)  06/03/20 176 lb (79.8 kg)  01/24/20 174 lb (78.9 kg)    Current Medications:  Current Outpatient Medications on File Prior to Visit  Medication Sig Dispense Refill  . B Complex Vitamins (VITAMIN-B COMPLEX) TABS Take 1 tablet by mouth.    . escitalopram (LEXAPRO)  20 MG tablet TAKE 1 TABLET BY MOUTH DAILY FOR MOOD 90 tablet 0  . levocetirizine (XYZAL) 5 MG tablet Take  1 tablet  Daily  for Allergies 90 tablet 0  . Sodium Sulfate-Mag Sulfate-KCl (SUTAB) [phone] MG TABS Take 1 kit by mouth as directed. 24 tablet 0  . topiramate (TOPAMAX) 100 MG tablet Take 1 tablet  at Bedtime  for Migraine Prevention & Weight Loss 90 tablet 0  . Ubrogepant (UBRELVY) 50 MG TABS Take 50 mg by mouth as needed. 30 tablet 1  . verapamil (CALAN-SR) 120 MG CR tablet TAKE 1 TABLET(120 MG) BY MOUTH AT BEDTIME 90 tablet 1  . Vitamin D, Ergocalciferol, (DRISDOL) 1.25 MG (50000 UNIT) CAPS capsule TAKE 1 CAPSULE BY MOUTH 3 DAYS A WEEK 36 capsule 1  . Bempedoic Acid-Ezetimibe (NEXLIZET) 180-10 MG TABS Take 1 tablet by mouth daily. (Patient not taking: Reported on 09/23/2020) 90 tablet 2   No current facility-administered medications on file prior to visit.   Allergies:  Allergies  Allergen Reactions  . Shellfish Allergy Anaphylaxis  . Shellfish-Derived Products Anaphylaxis  . Atorvastatin     Severe nausea  . Rosuvastatin     Nausea, GI   Medical History:  She has Hyperlipidemia; Anxiety; Asthma; Migraine; Vitamin D deficiency; Benign hematuria; Female genuine stress incontinence; Obesity (BMI 30.0-34.9); Medication management; and Labile hypertension on their problem list.   Health Maintenance:   Immunization History  Administered Date(s) Administered  . DTaP 10/24/2010  . Fluad Quad(high Dose 65+) 05/26/2019  . Influenza Inj Mdck Quad With Preservative 05/02/2017, 05/10/2018, 06/03/2020  . Influenza Split 05/11/2012, 05/13/2013  . Influenza,inj,Quad PF,6+ Mos 05/26/2019, 05/26/2019  . Influenza-Unspecified 05/11/2015  . PFIZER(Purple Top)SARS-COV-2 Vaccination 08/19/2019, 09/09/2019  . PPD Test 08/27/2013, 09/23/2014  . Pneumococcal Polysaccharide-23 08/27/2013  . Tdap 10/24/2010   Tetanus: 2012, due 2022 at CPE Pneumovax: 2015 Prevnar 13: due age 55 Flu  vaccine: 2018 Zostavax: N/A LMP: s/p TAH Pap:05/02/2017  MGM: 2016, Dr Garwin Brothers  DEXA: N/A Colonoscopy: 10/2016 CT head 2013  Patient Care Team: Unk Pinto, MD as PCP - General (Internal Medicine) Kathie Rhodes, MD (Inactive) as Consulting Physician (Urology)  Surgical History:  She has a past surgical history that includes LEEP; Abdominal hysterectomy (1998); Tonsilectomy/adenoidectomy with myringotomy; Colonoscopy (2018); and Polypectomy. Family History:  Herfamily history includes Alzheimer's disease in an other family member; Cancer in her maternal aunt and maternal uncle; Depression in an other family member; Diabetes in her paternal grandmother; Healthy in her daughter, sister, and son; Heart disease in her maternal aunt; Hyperlipidemia in her mother; Liver disease in her father; Migraines in her mother. Social History:  She reports that she has been smoking cigarettes. She has never used smokeless tobacco. She reports current alcohol use. She reports that she does not use drugs.  Review of Systems: Review of Systems  Constitutional: Negative.   Eyes: Negative.   Respiratory: Negative.   Cardiovascular: Negative.   Gastrointestinal: Negative.   Genitourinary: Negative.   Musculoskeletal: Negative for joint pain.  Skin: Negative.   Neurological: Positive for  headaches. Negative for dizziness, tingling, tremors, sensory change, speech change, focal weakness, seizures and loss of consciousness.  Endo/Heme/Allergies: Negative.   Psychiatric/Behavioral: Negative.     Physical Exam: Estimated body mass index is 31 kg/m as calculated from the following:   Height as of this encounter: '5\' 3"'  (1.6 m).   Weight as of this encounter: 175 lb (79.4 kg). BP 128/76   Pulse 73   Temp (!) 97.3 F (36.3 C)   Ht '5\' 3"'  (1.6 m)   Wt 175 lb (79.4 kg)   SpO2 98%   BMI 31.00 kg/m  General Appearance: Well nourished, in no apparent distress.  Eyes: PERRLA, EOMs, conjunctiva no  swelling or erythema, normal fundi and vessels.  Sinuses: No Frontal/maxillary tenderness  ENT/Mouth: Ext aud canals clear, normal light reflex with TMs without erythema, bulging. Good dentition. No erythema, swelling, or exudate on post pharynx. Tonsils not swollen or erythematous. Hearing normal.  Neck: Supple, thyroid normal. No bruits  Respiratory: Respiratory effort normal, BS equal bilaterally without rales, rhonchi, wheezing or stridor.  Cardio: RRR without murmurs, rubs or gallops. Brisk peripheral pulses without edema.  Abdomen: Soft, nontender, no guarding, rebound, hernias, masses, or organomegaly.  Lymphatics: Non tender without lymphadenopathy.  Genitourinary: defer Musculoskeletal: Full ROM all peripheral extremities,5/5 strength, and normal gait.  Skin: Warm, dry without rashes, lesions, ecchymosis. Skin tags noted, neck.(Make appointment if bothersome for removal). Neuro: Cranial nerves intact, reflexes equal bilaterally. Normal muscle tone, no cerebellar symptoms. Sensation intact.  Psych: Awake and oriented X 3, normal affect, Insight and Judgment appropriate.       Garnet Sierras, Laqueta Jean, DNP [facility] 09/23/2020  11:01 AM

## 2020-09-24 LAB — CBC WITH DIFFERENTIAL/PLATELET
Absolute Monocytes: 708 cells/uL (ref 200–950)
Basophils Absolute: 49 cells/uL (ref 0–200)
Basophils Relative: 0.5 %
Eosinophils Absolute: 116 cells/uL (ref 15–500)
Eosinophils Relative: 1.2 %
HCT: 44.1 % (ref 35.0–45.0)
Hemoglobin: 14.6 g/dL (ref 11.7–15.5)
Lymphs Abs: 4734 cells/uL — ABNORMAL HIGH (ref 850–3900)
MCH: 28.5 pg (ref 27.0–33.0)
MCHC: 33.1 g/dL (ref 32.0–36.0)
MCV: 86 fL (ref 80.0–100.0)
MPV: 9.5 fL (ref 7.5–12.5)
Monocytes Relative: 7.3 %
Neutro Abs: 4093 cells/uL (ref 1500–7800)
Neutrophils Relative %: 42.2 %
Platelets: 276 10*3/uL (ref 140–400)
RBC: 5.13 10*6/uL — ABNORMAL HIGH (ref 3.80–5.10)
RDW: 12.5 % (ref 11.0–15.0)
Total Lymphocyte: 48.8 %
WBC: 9.7 10*3/uL (ref 3.8–10.8)

## 2020-09-24 LAB — LIPID PANEL
Cholesterol: 368 mg/dL — ABNORMAL HIGH (ref <200)
HDL: 37 mg/dL — ABNORMAL LOW (ref >=50)
LDL Cholesterol (Calc): 295 mg/dL (calc) — ABNORMAL HIGH
Non-HDL Cholesterol (Calc): 331 mg/dL (calc) — ABNORMAL HIGH (ref <130)
Total CHOL/HDL Ratio: 9.9 (calc) — ABNORMAL HIGH (ref <5.0)
Triglycerides: 185 mg/dL — ABNORMAL HIGH (ref <150)

## 2020-09-24 LAB — COMPLETE METABOLIC PANEL WITH GFR
AG Ratio: 1.7 (calc) (ref 1.0–2.5)
ALT: 10 U/L (ref 6–29)
AST: 11 U/L (ref 10–35)
Albumin: 4.1 g/dL (ref 3.6–5.1)
Alkaline phosphatase (APISO): 101 U/L (ref 37–153)
BUN: 12 mg/dL (ref 7–25)
CO2: 27 mmol/L (ref 20–32)
Calcium: 9.5 mg/dL (ref 8.6–10.4)
Chloride: 107 mmol/L (ref 98–110)
Creat: 0.65 mg/dL (ref 0.50–1.05)
GFR, Est African American: 116 mL/min/{1.73_m2} (ref >=60)
GFR, Est Non African American: 100 mL/min/{1.73_m2} (ref >=60)
Globulin: 2.4 g/dL (calc) (ref 1.9–3.7)
Glucose, Bld: 85 mg/dL (ref 65–99)
Potassium: 4.3 mmol/L (ref 3.5–5.3)
Sodium: 140 mmol/L (ref 135–146)
Total Bilirubin: 0.5 mg/dL (ref 0.2–1.2)
Total Protein: 6.5 g/dL (ref 6.1–8.1)

## 2020-09-24 LAB — SEDIMENTATION RATE: Sed Rate: 2 mm/h (ref 0–30)

## 2020-09-24 LAB — C-REACTIVE PROTEIN: CRP: 0.9 mg/L (ref <8.0)

## 2020-09-28 ENCOUNTER — Telehealth: Payer: Self-pay

## 2020-09-28 NOTE — Telephone Encounter (Signed)
-----   Message from Garnet Sierras, NP sent at 09/28/2020 10:28 AM EST ----- Regarding: RE: prior auth Was that through the [place]?  Sincerely,           Garnet Sierras, NP    ----- Message ----- From: Elenor Quinones, CMA Sent: 09/28/2020   9:30 AM EST To: Garnet Sierras, NP Subject: prior Josem Kaufmann                                     FYI: patient has been made aware of this as well   Nexlizet---denied.   Ubrelvy 50  mgs----approved.

## 2020-09-28 NOTE — Telephone Encounter (Signed)
Prior Auth approved for UBRELVY 50 mgs but NEXLIZET was denied.  Mar 7th 2022.

## 2020-10-29 ENCOUNTER — Other Ambulatory Visit: Payer: Self-pay | Admitting: Adult Health Nurse Practitioner

## 2020-10-29 DIAGNOSIS — E785 Hyperlipidemia, unspecified: Secondary | ICD-10-CM

## 2020-10-29 MED ORDER — BEMPEDOIC ACID-EZETIMIBE 180-10 MG PO TABS: 1.0000 | ORAL_TABLET | Freq: Every day | ORAL | 3 refills | Status: DC

## 2020-11-16 ENCOUNTER — Other Ambulatory Visit: Payer: Self-pay | Admitting: Internal Medicine

## 2020-11-16 DIAGNOSIS — J301 Allergic rhinitis due to pollen: Secondary | ICD-10-CM

## 2021-01-15 ENCOUNTER — Other Ambulatory Visit: Payer: Self-pay

## 2021-01-15 ENCOUNTER — Ambulatory Visit (INDEPENDENT_AMBULATORY_CARE_PROVIDER_SITE_OTHER): Payer: BC Managed Care – PPO | Admitting: Family

## 2021-01-15 ENCOUNTER — Ambulatory Visit: Payer: Self-pay

## 2021-01-15 DIAGNOSIS — M25571 Pain in right ankle and joints of right foot: Secondary | ICD-10-CM

## 2021-01-15 DIAGNOSIS — S93621A Sprain of tarsometatarsal ligament of right foot, initial encounter: Secondary | ICD-10-CM

## 2021-01-15 DIAGNOSIS — M79671 Pain in right foot: Secondary | ICD-10-CM | POA: Diagnosis not present

## 2021-01-18 ENCOUNTER — Telehealth: Payer: Self-pay | Admitting: Family

## 2021-01-18 NOTE — Telephone Encounter (Signed)
Called and sw pt to advise that we are waiting for authorization from her insurance company and typically that can take several business days. As soon as we receive authorization for the procedure we will call pt to get scheduled.

## 2021-01-18 NOTE — Telephone Encounter (Signed)
Pt called in a lot of pain wanting to speak to a nurse and wanting to check on the status of her MRI that she needed to get scheduled. Pt wanted to know if there was something on her end that she had to do to push the process along. The best call back number is [phone]

## 2021-01-18 NOTE — Telephone Encounter (Signed)
This MRI request is from Friday but the pt is calling and wanting to check the status. Do you have any information that I can give her?

## 2021-01-19 ENCOUNTER — Encounter: Payer: Self-pay | Admitting: Family

## 2021-01-19 NOTE — Progress Notes (Signed)
Office Visit Note   Patient: Kimberly Zhang           Date of Birth: June 19, 1966           MRN: [id number] Visit Date: 01/15/2021              Requested by: Unk Pinto, [address],  [place] [address] PCP: Unk Pinto, MD  Chief Complaint  Patient presents with   Right Foot - Pain      HPI: The patient is a 55 year old woman who presents today for initial evaluation of right and lateral right ankle pain.  She initially had an inversion plantarflexion injury in December she has been trying to just get by since then she notices that the pain has been gradually worsening she walks on the lateral column of her foot.  She is concerned because she feels she has been having some deformity of her foot feels it is starting to point outward.  She notices some bruising that has continued over the dorsum of her foot.   States she did have initial bruising and swelling but its been many months since her injury  In some stiff inflexible soled sandals today and use of the most comfortable shoes she has  Assessment & Plan: Visit Diagnoses:  1. Pain in right ankle and joints of right foot   2. Pain in right foot   3. Sprain of ligament of tarsometatarsal joint of right foot, initial encounter     Plan: concern for lisfranc injury. Discussed pain medication. Offered a postop shoe.  She will continue on her stiff shoe wear.  We will send for an MRI evaluating for Lisfranc injury of the right foot.   Follow-Up Instructions: No follow-ups on file.   Right Ankle Exam   Tenderness  The patient is experiencing tenderness in the deltoid and CF (dorsum of foot). Swelling: mild   Comments:  Painful rom of ankle, pain with inversion/plantarflexion of foot. Weak active plantarflexion great toe     Patient is alert, oriented, no adenopathy, well-dressed, normal affect, normal respiratory effort.   Imaging: No results found. No images are attached to  the encounter.  Labs: Lab Results  Component Value Date   HGBA1C 5.5 06/03/2020   HGBA1C 5.5 08/27/2019   HGBA1C 5.4 05/27/2019   ESRSEDRATE 2 09/23/2020   CRP 0.9 09/23/2020   LABORGA NO GROWTH 10/06/2014     Lab Results  Component Value Date   ALBUMIN 3.7 10/19/2016   ALBUMIN 3.9 06/23/2016   ALBUMIN 4.0 03/21/2016    Lab Results  Component Value Date   MG 2.0 06/03/2020   MG 1.9 12/02/2019   MG 1.9 08/27/2019   Lab Results  Component Value Date   VD25OH 40 06/03/2020   VD25OH 52 12/02/2019   VD25OH 130 (H) 08/27/2019    No results found for: PREALBUMIN CBC EXTENDED Latest Ref Rng & Units 09/23/2020 06/03/2020 12/02/2019  WBC 3.8 - 10.8 Thousand/uL 9.7 7.9 7.8  RBC 3.80 - 5.10 Million/uL 5.13(H) 5.14(H) 5.03  HGB 11.7 - 15.5 g/dL 14.6 14.8 14.3  HCT 35.0 - 45.0 % 44.1 44.1 42.9  PLT 140 - 400 Thousand/uL 276 300 261  NEUTROABS 1,500 - 7,800 cells/uL 4,093 3,539 3,214  LYMPHSABS 850 - 3,900 cells/uL 4,734(H) 3,666 3,799     There is no height or weight on file to calculate BMI.  Orders:  Orders Placed This Encounter  Procedures   XR Foot Complete Right  XR Ankle 2 Views Right   MR Foot Right w/o contrast   No orders of the defined types were placed in this encounter.    Procedures: No procedures performed  Clinical Data: No additional findings.  ROS:  All other systems negative, except as noted in the HPI. Review of Systems  Objective: Vital Signs: There were no vitals taken for this visit.  Specialty Comments:  No specialty comments available.  PMFS History: Patient Active Problem List   Diagnosis Date Noted   Labile hypertension 08/27/2019   Obesity (BMI 30.0-34.9) 12/01/2015   Medication management 12/01/2015   Female genuine stress incontinence 10/20/2014   Benign hematuria    Hyperlipidemia    Anxiety    Asthma    Migraine    Vitamin D deficiency    Past Medical History:  Diagnosis Date   Allergy    Anxiety    Asthma     Benign hematuria 2011   Ottelin   Hyperlipidemia    Migraine    Vitamin D deficiency     Family History  Problem Relation Age of Onset   Liver disease Father    Hyperlipidemia Mother    Migraines Mother    Diabetes Paternal Grandmother    Heart disease Maternal Aunt    Cancer Maternal Aunt    Cancer Maternal Uncle    Depression Other    Alzheimer's disease Other    Healthy Sister    Healthy Son    Healthy Daughter    Colon cancer Neg Hx    Esophageal cancer Neg Hx    Rectal cancer Neg Hx    Stomach cancer Neg Hx    Colon polyps Neg Hx     Past Surgical History:  Procedure Laterality Date   ABDOMINAL HYSTERECTOMY  1998   COLONOSCOPY  2018   LEEP     POLYPECTOMY     TONSILECTOMY/ADENOIDECTOMY WITH MYRINGOTOMY     Social History   Occupational History   Not on file  Tobacco Use   Smoking status: Some Days    Pack years: 0.00    Types: Cigarettes    Last attempt to quit: 09/23/2015    Years since quitting: 5.3   Smokeless tobacco: Never  Vaping Use   Vaping Use: Never used  Substance and Sexual Activity   Alcohol use: Yes    Alcohol/week: 0.0 standard drinks    Comment: twice yearly   Drug use: No   Sexual activity: Not on file

## 2021-01-21 NOTE — Telephone Encounter (Signed)
Pt has been approved and the order has been sent to imaging, someone from imaging should be calling pt to schedule  Order ID: [id number]       Authorized   Approval Valid Through: 01/21/2021 - 02/19/2021

## 2021-01-31 ENCOUNTER — Other Ambulatory Visit: Payer: Self-pay

## 2021-01-31 ENCOUNTER — Ambulatory Visit
Admission: RE | Admit: 2021-01-31 | Discharge: 2021-01-31 | Disposition: A | Discharge status: Home / Self Care | Payer: BC Managed Care – PPO | Source: Ambulatory Visit | Attending: Family | Admitting: Family

## 2021-01-31 DIAGNOSIS — M79671 Pain in right foot: Secondary | ICD-10-CM

## 2021-01-31 IMAGING — MR MR FOOT*R* W/O CM
4 of 5 series · 23 of 40 positions shown · non-contrast
Comparison: Right foot and ankle x-rays dated [DATE].

CLINICAL DATA: Persistent severe right foot pain since fall last
[REDACTED].

EXAM:
MRI OF THE RIGHT FOREFOOT WITHOUT CONTRAST
TECHNIQUE: Multiplanar, multisequence MR imaging of the right forefoot was
performed. No intravenous contrast was administered.

[Series 4: T1 · coronal · 3.0mm · 0.23mm/px · 4 of 50 slices shown (1 of 2)]
[im 1/50]
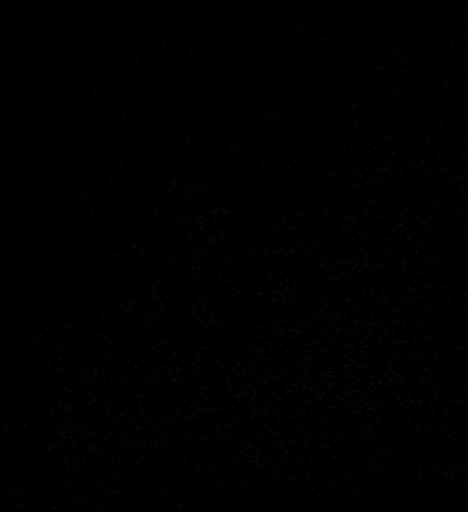
[im 9/50]
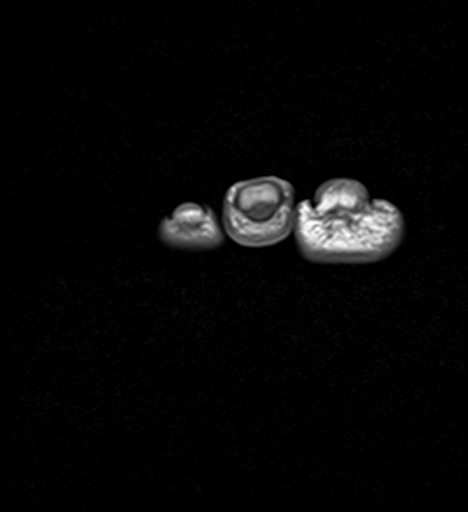
[im 27/50]
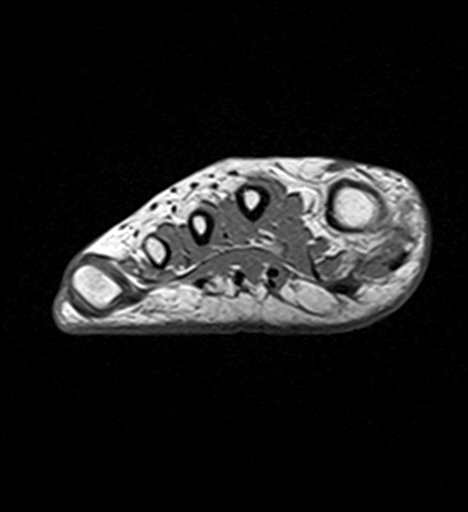
[im 45/50]
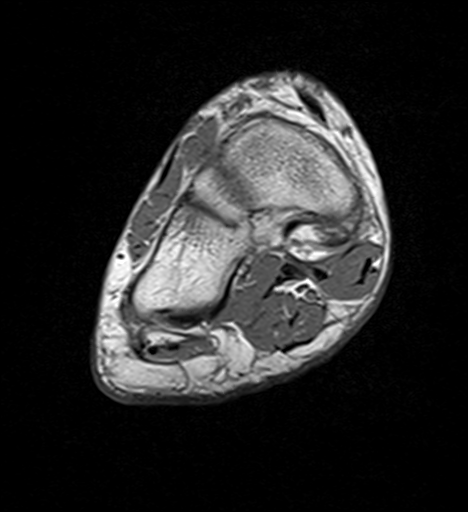

[Series 5: T2 fat-sat · coronal · 3.0mm · 0.23mm/px · 11 of 50 slices shown (1 of 2)]
[im 1/50]
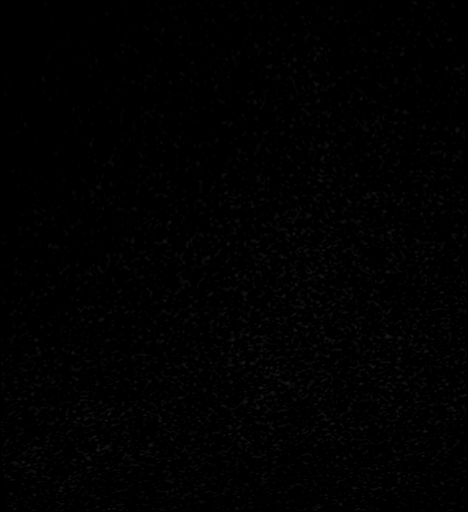
[im 5/50]
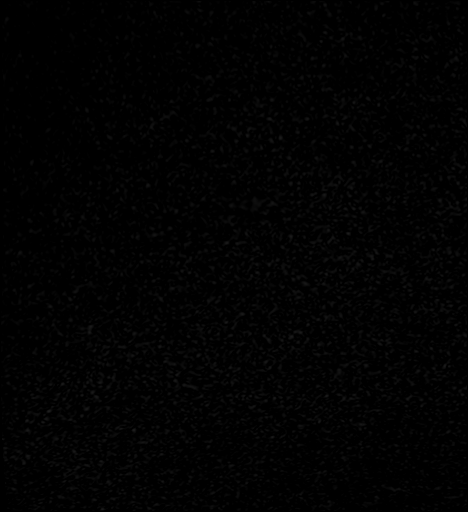
[im 10/50]
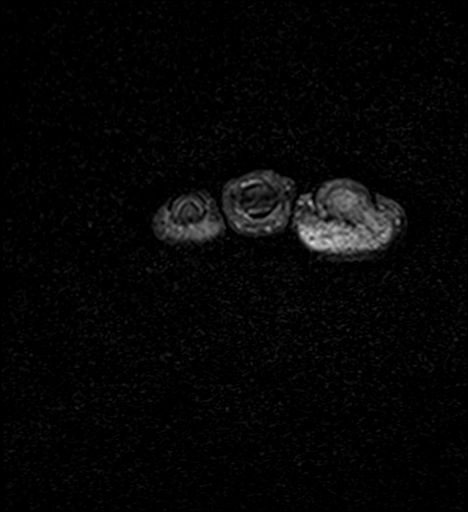
[im 15/50]
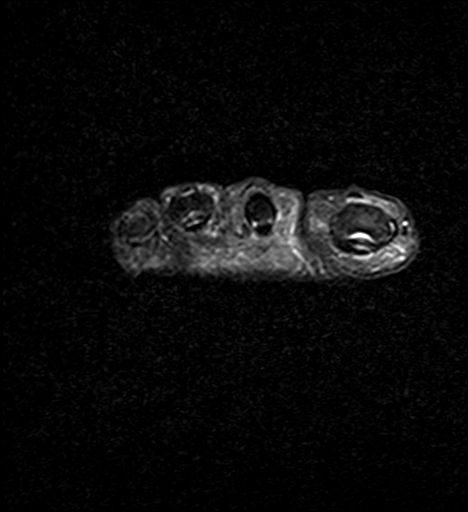
[im 20/50]
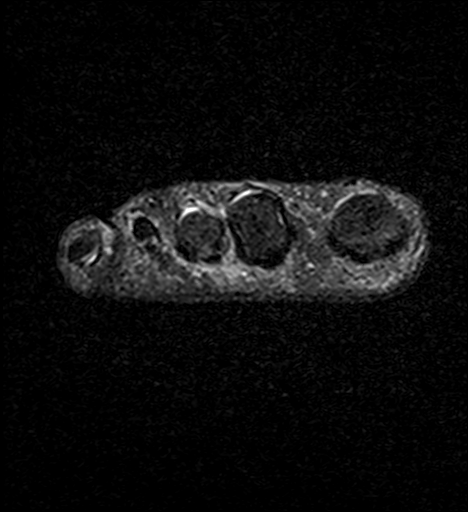
[im 25/50]
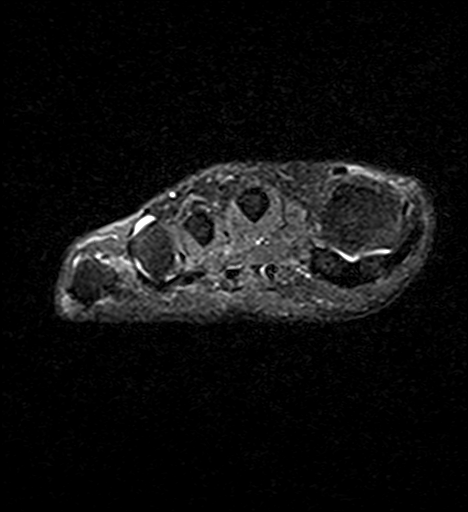
[im 30/50]
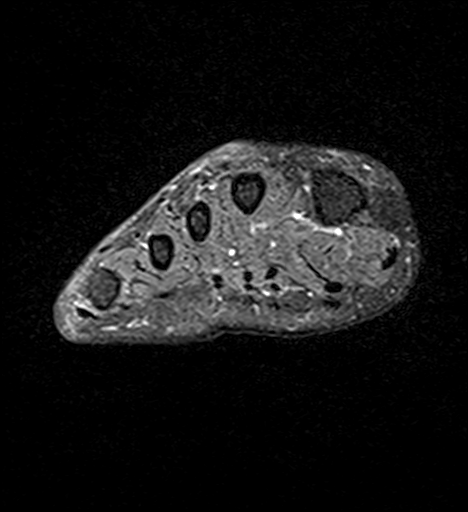
[im 35/50]
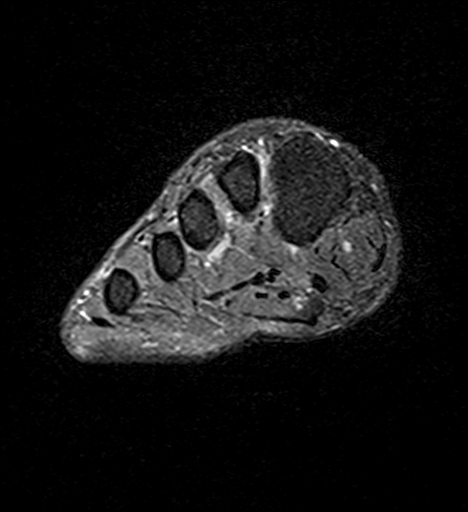
[im 40/50]
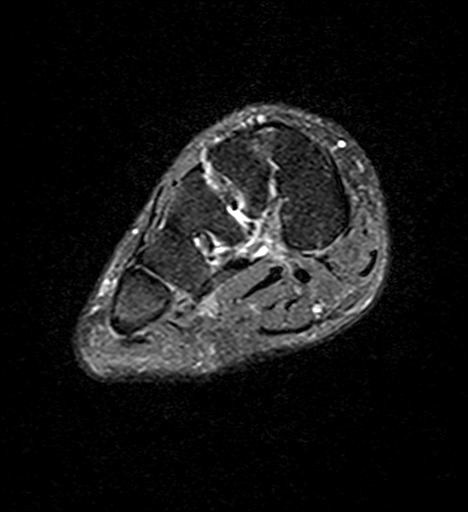
[im 45/50]
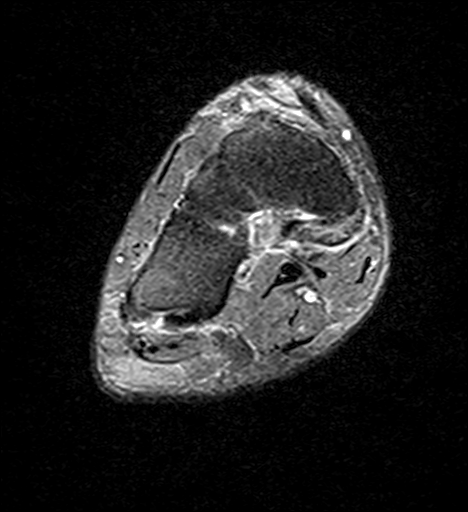
[im 50/50]
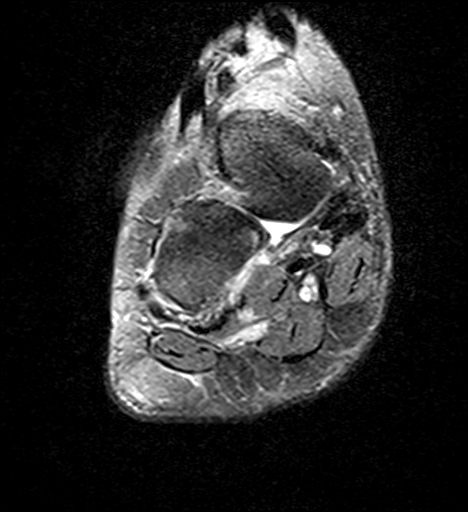

[Series 6: T2 fat-sat · axial · 3.0mm · 0.35mm/px · z∈[-95,-16]mm · 5 of 22 slices shown (2 of 2)]
[im 1/22]
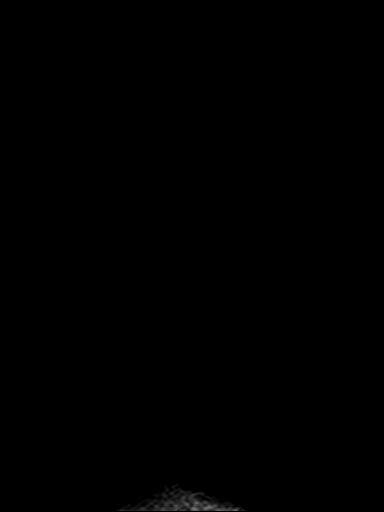
[im 6/22]
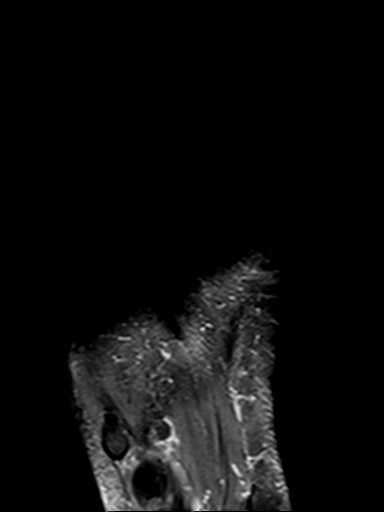
[im 11/22]
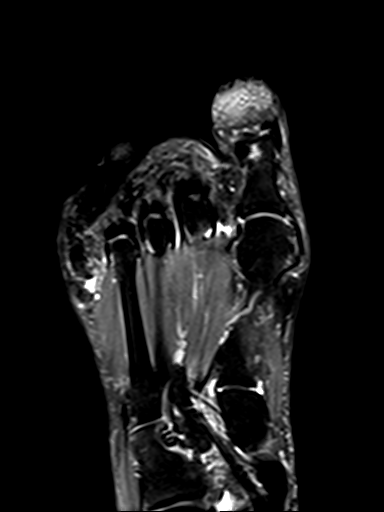
[im 16/22]
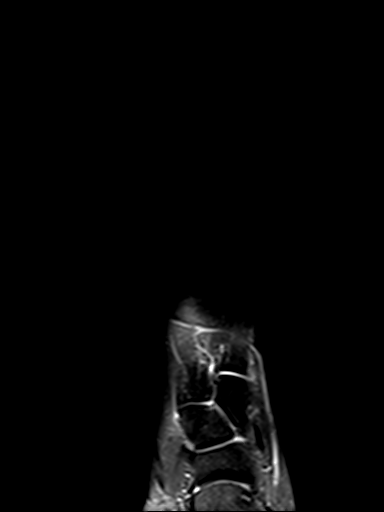
[im 22/22]
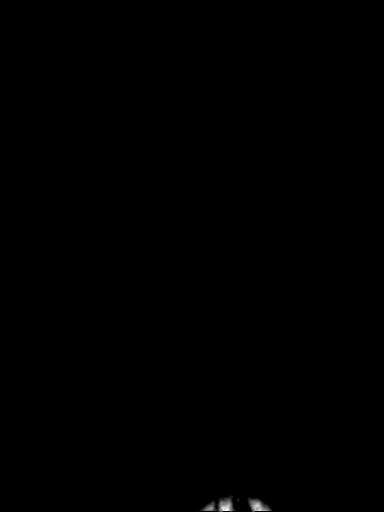

[Series 7: T1 · axial · 3.0mm · 0.35mm/px · z∈[-95,-16]mm · 3 of 22 slices shown (2 of 2)]
[im 1/22]
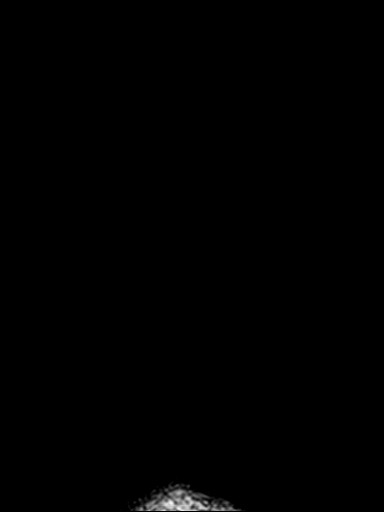
[im 11/22]
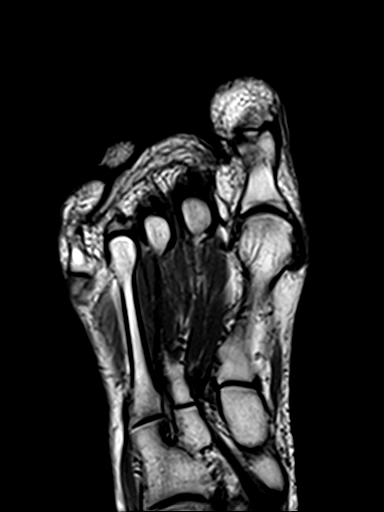
[im 22/22]
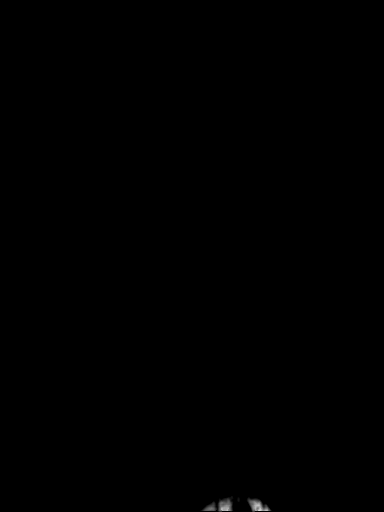

[23 of 40 positions shown; findings below may reference images not displayed]

FINDINGS: Bones/Joint/Cartilage

No marrow signal abnormality. No fracture or dislocation. Joint
spaces are preserved. No joint effusion.

Ligaments

Collateral ligaments are intact.  Lisfranc ligament is intact.

Muscles and Tendons
Intact.  No muscle edema or atrophy.

Soft tissue
No fluid collection or hematoma.  No soft tissue mass.
IMPRESSION: 1. Normal MRI of the right forefoot.  No Lisfranc injury.

## 2021-02-03 ENCOUNTER — Other Ambulatory Visit: Payer: BC Managed Care – PPO

## 2021-02-11 ENCOUNTER — Ambulatory Visit: Payer: BC Managed Care – PPO | Admitting: Orthopedic Surgery

## 2021-02-11 DIAGNOSIS — S93621A Sprain of tarsometatarsal ligament of right foot, initial encounter: Secondary | ICD-10-CM

## 2021-03-11 ENCOUNTER — Ambulatory Visit: Payer: BC Managed Care – PPO | Admitting: Orthopedic Surgery

## 2021-03-17 ENCOUNTER — Encounter: Payer: Self-pay | Admitting: Orthopedic Surgery

## 2021-03-17 NOTE — Progress Notes (Signed)
Office Visit Note   Patient: Kimberly Zhang           Date of Birth: [birth year]           MRN: [id number] Visit Date: 02/11/2021              Requested by: Unk Pinto, [address] [place],  [place] [address] PCP: Unk Pinto, MD  Chief Complaint  Patient presents with   Right Foot - Follow-up    MRI review      HPI: Patient is a 55 year old woman who is seen in follow-up status MRI scan of her right foot.  Patient states she fell with a plantarflexed foot.  She has been having pain in the second metatarsal head she states she was wearing a fracture boot for 3 weeks.  She denies any swelling at this time.  Assessment & Plan: Visit Diagnoses: No diagnosis found.  Plan: Patient's symptoms are consistent with a sprain of the Lisfranc joint.  There is no rupture of the Lisfranc ligament by MRI scan there is no edema across the midfoot.  We will advance her to a stiff soled shoe custom orthotics Voltaren gel Achilles stretching follow-up in 4 weeks with three-view radiographs of the right foot weightbearing.  Follow-Up Instructions: No follow-ups on file.   Ortho Exam  Patient is alert, oriented, no adenopathy, well-dressed, normal affect, normal respiratory effort. Examination patient has good pulses there is no redness no cellulitis there is no swelling she has pain to palpation at the base of the second and first metatarsal.  Review of the MRI scan shows a normal MRI of the right foot without edema present for MRI findings of a Lisfranc injury.  Imaging: No results found. No images are attached to the encounter.  Labs: Lab Results  Component Value Date   HGBA1C 5.5 06/03/2020   HGBA1C 5.5 08/27/2019   HGBA1C 5.4 05/27/2019   ESRSEDRATE 2 09/23/2020   CRP 0.9 09/23/2020   LABORGA NO GROWTH 10/06/2014     Lab Results  Component Value Date   ALBUMIN 3.7 10/19/2016   ALBUMIN 3.9 06/23/2016   ALBUMIN 4.0 03/21/2016    Lab Results   Component Value Date   MG 2.0 06/03/2020   MG 1.9 12/02/2019   MG 1.9 08/27/2019   Lab Results  Component Value Date   VD25OH 40 06/03/2020   VD25OH 52 12/02/2019   VD25OH 130 (H) 08/27/2019    No results found for: PREALBUMIN CBC EXTENDED Latest Ref Rng & Units 09/23/2020 06/03/2020 12/02/2019  WBC 3.8 - 10.8 Thousand/uL 9.7 7.9 7.8  RBC 3.80 - 5.10 Million/uL 5.13(H) 5.14(H) 5.03  HGB 11.7 - 15.5 g/dL 14.6 14.8 14.3  HCT 35.0 - 45.0 % 44.1 44.1 42.9  PLT 140 - 400 Thousand/uL 276 300 261  NEUTROABS 1,500 - 7,800 cells/uL 4,093 3,539 3,214  LYMPHSABS 850 - 3,900 cells/uL 4,734(H) 3,666 3,799     There is no height or weight on file to calculate BMI.  Orders:  No orders of the defined types were placed in this encounter.  No orders of the defined types were placed in this encounter.    Procedures: No procedures performed  Clinical Data: No additional findings.  ROS:  All other systems negative, except as noted in the HPI. Review of Systems  Objective: Vital Signs: There were no vitals taken for this visit.  Specialty Comments:  No specialty comments available.  PMFS History: Patient Active Problem List  Diagnosis Date Noted   Labile hypertension 08/27/2019   Obesity (BMI 30.0-34.9) 12/01/2015   Medication management 12/01/2015   Female genuine stress incontinence 10/20/2014   Benign hematuria    Hyperlipidemia    Anxiety    Asthma    Migraine    Vitamin D deficiency    Past Medical History:  Diagnosis Date   Allergy    Anxiety    Asthma    Benign hematuria 2011   Ottelin   Hyperlipidemia    Migraine    Vitamin D deficiency     Family History  Problem Relation Age of Onset   Liver disease Father    Hyperlipidemia Mother    Migraines Mother    Diabetes Paternal Grandmother    Heart disease Maternal Aunt    Cancer Maternal Aunt    Cancer Maternal Uncle    Depression Other    Alzheimer's disease Other    Healthy Sister    Healthy Son     Healthy Daughter    Colon cancer Neg Hx    Esophageal cancer Neg Hx    Rectal cancer Neg Hx    Stomach cancer Neg Hx    Colon polyps Neg Hx     Past Surgical History:  Procedure Laterality Date   ABDOMINAL HYSTERECTOMY  1998   COLONOSCOPY  2018   LEEP     POLYPECTOMY     TONSILECTOMY/ADENOIDECTOMY WITH MYRINGOTOMY     Social History   Occupational History   Not on file  Tobacco Use   Smoking status: Some Days    Types: Cigarettes    Last attempt to quit: 09/23/2015    Years since quitting: 5.4   Smokeless tobacco: Never  Vaping Use   Vaping Use: Never used  Substance and Sexual Activity   Alcohol use: Yes    Alcohol/week: 0.0 standard drinks    Comment: twice yearly   Drug use: No   Sexual activity: Not on file

## 2021-04-20 ENCOUNTER — Encounter: Payer: Self-pay | Admitting: Adult Health

## 2021-04-20 ENCOUNTER — Ambulatory Visit: Payer: BC Managed Care – PPO | Admitting: Adult Health

## 2021-04-20 ENCOUNTER — Other Ambulatory Visit: Payer: Self-pay

## 2021-04-20 VITALS — BP 104/64 | HR 67 | Temp 96.6°F | Wt 181.0 lb

## 2021-04-20 DIAGNOSIS — E559 Vitamin D deficiency, unspecified: Secondary | ICD-10-CM | POA: Diagnosis not present

## 2021-04-20 DIAGNOSIS — R5383 Other fatigue: Secondary | ICD-10-CM

## 2021-04-20 DIAGNOSIS — M791 Myalgia, unspecified site: Secondary | ICD-10-CM

## 2021-04-20 DIAGNOSIS — K5900 Constipation, unspecified: Secondary | ICD-10-CM

## 2021-04-20 DIAGNOSIS — E538 Deficiency of other specified B group vitamins: Secondary | ICD-10-CM

## 2021-04-20 NOTE — Progress Notes (Signed)
Assessment and Plan:  Kimberly Zhang was seen today for fatigue and joint pain.  Diagnoses and all orders for this visit:  Fatigue, unspecified type New onset fatigue, 3 weeks, progressive with cold intolerance, constipation Suspect new onset hypothyroid, however also r/o anemia ? Myalgias, check - CK, ESR, CRP Depression but not clearly correlated - may try adding wellbutrin if otherwise workup unremarkable Fatigue non-exertional, generalized, low concern for cardiac etiology  Consider sleep study if chronic/persistent  Restart B12, vit D as indicated Denies tick exposure, declined lyme's screen at today's visit  If labs negative, keep log of sx, close follow up  -     CBC with Differential/Platelet -     COMPLETE METABOLIC PANEL WITH GFR -     TSH -     Vitamin B12 -     Sedimentation rate -     C-reactive protein -     CK  B12 deficiency -     Vitamin B12  Myalgia -     Sedimentation rate -     C-reactive protein -     CK  Vitamin D deficiency -     VITAMIN D 25 Hydroxy (Vit-D Deficiency, Fractures)   Further disposition pending results of labs. Discussed med's effects and SE's.   Over 30 minutes of exam, counseling, chart review, and critical decision making was performed.   Future Appointments  Date Time Provider [place]  06/03/2021 10:00 AM Magda Bernheim, NP [id number] None    ------------------------------------------------------------------------------------------------------------------   HPI BP 104/64   Pulse 67   Temp (!) 96.6 F (35.9 C)   Wt 181 lb (82.1 kg)   SpO2 99%   BMI 32.06 kg/m  55 y.o.female presents for evaluation of extreme fatigue.   She reports 3 weeks of gradual onset fatigue, will wake up not feeling rested, mental and physical fatigue/exhaustion, struggling to stay awake.   Will get home from work 4:30 pm, will lie down to rest and can sleep through to the next day, will feel drowsy at work. Typically will try to get up. She  reports in the last few days has had generalized aching/myalgias, stiffness in hands, mild sense of puffiness, heaviness in limbs. No personal/family hx of autoimmune.   She reports struggling to sleep, diet is the same, eating small portions, healthy. She reports constipation, onset in the last 3 weeks. She denies fever/chills but does have new cold intolerance. Denies dry skin/hair loss.   She reports down/depressed, has had several deaths (5) in the last few weeks, but notes fatigue started prior to this. Has been on lexapro 20 mg for mood/anxiety for many years, still taking.   She denies alcohol intake Denies snoring, headaches.   Denies blood in stool, dark/black stools.   She denies tick bites.   BMI is Body mass index is 32.06 kg/m., no changes in lifestyle/exericse, gaining weight -  Wt Readings from Last 3 Encounters:  04/20/21 181 lb (82.1 kg)  09/23/20 175 lb (79.4 kg)  06/03/20 176 lb (79.8 kg)   She had colonoscopy by Dr. Henrene Pastor 01/24/2020, 1 polyp, 5 year recall  Hx of B12 def, hasn't been taking supplement recently -  Lab Results  Component Value Date   VITAMINB12 322 06/03/2020   Hx of vitamin D def, admits off of supplement since last check  Lab Results  Component Value Date   VD25OH 40 06/03/2020        Past Medical History:  Diagnosis Date   Allergy  Anxiety    Asthma    Benign hematuria 2011   Ottelin   Hyperlipidemia    Migraine    Vitamin D deficiency      Allergies  Allergen Reactions   Shellfish Allergy Anaphylaxis   Shellfish-Derived Products Anaphylaxis   Atorvastatin     Severe nausea   Rosuvastatin     Nausea, GI    Current Outpatient Medications on File Prior to Visit  Medication Sig   B Complex Vitamins (VITAMIN-B COMPLEX) TABS Take 1 tablet by mouth.   Bempedoic Acid-Ezetimibe (NEXLIZET) 180-10 MG TABS Take 1 tablet by mouth daily.   escitalopram (LEXAPRO) 20 MG tablet TAKE 1 TABLET BY MOUTH DAILY FOR MOOD   levocetirizine  (XYZAL) 5 MG tablet TAKE 1 TABLET BY MOUTH DAILY FOR ALLERGIES   topiramate (TOPAMAX) 100 MG tablet Take 1 tablet  at Bedtime  for Migraine Prevention & Weight Loss   Ubrogepant (UBRELVY) 50 MG TABS Take 50 mg by mouth as needed.   verapamil (CALAN-SR) 120 MG CR tablet TAKE 1 TABLET(120 MG) BY MOUTH AT BEDTIME   Vitamin D, Ergocalciferol, (DRISDOL) 1.25 MG (50000 UNIT) CAPS capsule TAKE 1 CAPSULE BY MOUTH 3 DAYS A WEEK   Bempedoic Acid-Ezetimibe 180-10 MG TABS Take 1 tablet by mouth daily.   Sodium Sulfate-Mag Sulfate-KCl (SUTAB) [phone] MG TABS Take 1 kit by mouth as directed. (Patient not taking: Reported on 04/20/2021)   No current facility-administered medications on file prior to visit.    Allergies:  Allergies  Allergen Reactions   Shellfish Allergy Anaphylaxis   Shellfish-Derived Products Anaphylaxis   Atorvastatin     Severe nausea   Rosuvastatin     Nausea, GI   Surgical History:  She  has a past surgical history that includes LEEP; Abdominal hysterectomy (1998); Tonsilectomy/adenoidectomy with myringotomy; Colonoscopy (2018); and Polypectomy. Family History:  Herfamily history includes Alzheimer's disease in an other family member; Cancer in her maternal aunt and maternal uncle; Depression in an other family member; Diabetes in her paternal grandmother; Healthy in her daughter, sister, and son; Heart disease in her maternal aunt; Hyperlipidemia in her mother; Liver disease in her father; Migraines in her mother. Social History:   reports that she has been smoking cigarettes. She has never used smokeless tobacco. She reports current alcohol use. She reports that she does not use drugs.   ROS: Review of Systems  Constitutional:  Positive for chills (cold intolerance) and malaise/fatigue. Negative for diaphoresis, fever and weight loss.  HENT: Negative.    Eyes: Negative.   Respiratory:  Positive for shortness of breath (mild, climbing stairs). Negative for cough,  hemoptysis, sputum production and wheezing.   Cardiovascular:  Negative for chest pain, palpitations, orthopnea, claudication, leg swelling and PND.  Gastrointestinal:  Positive for constipation (new). Negative for abdominal pain, blood in stool, diarrhea, heartburn, melena, nausea and vomiting.  Genitourinary: Negative.  Negative for dysuria, frequency, hematuria and urgency.  Musculoskeletal:  Positive for myalgias. Negative for back pain, falls, joint pain and neck pain.  Skin:  Negative for itching and rash.  Neurological:  Positive for weakness (generalized). Negative for dizziness, sensory change, focal weakness and headaches.  Endo/Heme/Allergies:  Negative for polydipsia.  Psychiatric/Behavioral:  Positive for depression. Negative for memory loss, substance abuse and suicidal ideas. The patient has insomnia. The patient is not nervous/anxious.     Physical Exam:  BP 104/64   Pulse 67   Temp (!) 96.6 F (35.9 C)   Wt 181 lb (82.1 kg)  SpO2 99%   BMI 32.06 kg/m   General Appearance: Well nourished, well dressed AA female, appears fatigued but in no apparent distress. Eyes: PERRLA, =conjunctiva no swelling or erythema. MM mildly pale Sinuses: No Frontal/maxillary tenderness ENT/Mouth: Ext aud canals clear, TMs without erythema, bulging. No erythema, swelling, or exudate on post pharynx.  Tonsils not swollen or erythematous. Hearing normal.  Neck: Supple, thyroid ? Subltly enlarged, mildly tender Respiratory: Respiratory effort normal, BS equal bilaterally without rales, rhonchi, wheezing or stridor.  Cardio: RRR with no MRGs. Brisk peripheral pulses without edema.  Abdomen: Soft, obese abdomen limits exam, + BS.  Non tender, no guarding, rebound, hernias, masses. Lymphatics: Non tender without lymphadenopathy.  Musculoskeletal: Full ROM, 5/5 strength, normal gait. No joint effusions. Muscles not exquisitely tender.  Skin: Warm, dry without rashes, lesions, ecchymosis.  Neuro:  Cranial nerves intact. Normal muscle tone, no cerebellar symptoms. Sensation intact.  Psych: Awake and oriented X 3, drowsy/depressed affect, Insight and Judgment appropriate.     Izora Ribas, NP 2:53 PM [facility]

## 2021-04-20 NOTE — Patient Instructions (Signed)

## 2021-04-21 ENCOUNTER — Encounter: Payer: Self-pay | Admitting: Adult Health

## 2021-04-21 ENCOUNTER — Other Ambulatory Visit: Payer: Self-pay | Admitting: Adult Health

## 2021-04-21 DIAGNOSIS — E538 Deficiency of other specified B group vitamins: Secondary | ICD-10-CM | POA: Insufficient documentation

## 2021-04-21 LAB — COMPLETE METABOLIC PANEL WITH GFR
AG Ratio: 1.6 (calc) (ref 1.0–2.5)
ALT: 11 U/L (ref 6–29)
AST: 12 U/L (ref 10–35)
Albumin: 3.9 g/dL (ref 3.6–5.1)
Alkaline phosphatase (APISO): 85 U/L (ref 37–153)
BUN: 16 mg/dL (ref 7–25)
CO2: 24 mmol/L (ref 20–32)
Calcium: 9.8 mg/dL (ref 8.6–10.4)
Chloride: 109 mmol/L (ref 98–110)
Creat: 0.74 mg/dL (ref 0.50–1.03)
Globulin: 2.4 g/dL (calc) (ref 1.9–3.7)
Glucose, Bld: 84 mg/dL (ref 65–99)
Potassium: 4.2 mmol/L (ref 3.5–5.3)
Sodium: 142 mmol/L (ref 135–146)
Total Bilirubin: 0.2 mg/dL (ref 0.2–1.2)
Total Protein: 6.3 g/dL (ref 6.1–8.1)
eGFR: 95 mL/min/{1.73_m2} (ref >=60)

## 2021-04-21 LAB — CBC WITH DIFFERENTIAL/PLATELET
Absolute Monocytes: 679 cells/uL (ref 200–950)
Basophils Absolute: 47 cells/uL (ref 0–200)
Basophils Relative: 0.6 %
Eosinophils Absolute: 166 cells/uL (ref 15–500)
Eosinophils Relative: 2.1 %
HCT: 41.6 % (ref 35.0–45.0)
Hemoglobin: 13.8 g/dL (ref 11.7–15.5)
Lymphs Abs: 4069 cells/uL — ABNORMAL HIGH (ref 850–3900)
MCH: 28.6 pg (ref 27.0–33.0)
MCHC: 33.2 g/dL (ref 32.0–36.0)
MCV: 86.1 fL (ref 80.0–100.0)
MPV: 9.4 fL (ref 7.5–12.5)
Monocytes Relative: 8.6 %
Neutro Abs: 2939 cells/uL (ref 1500–7800)
Neutrophils Relative %: 37.2 %
Platelets: 267 10*3/uL (ref 140–400)
RBC: 4.83 10*6/uL (ref 3.80–5.10)
RDW: 12.5 % (ref 11.0–15.0)
Total Lymphocyte: 51.5 %
WBC: 7.9 10*3/uL (ref 3.8–10.8)

## 2021-04-21 LAB — VITAMIN D 25 HYDROXY (VIT D DEFICIENCY, FRACTURES): Vit D, 25-Hydroxy: 50 ng/mL (ref 30–100)

## 2021-04-21 LAB — SEDIMENTATION RATE: Sed Rate: 2 mm/h (ref 0–30)

## 2021-04-21 LAB — VITAMIN B12: Vitamin B-12: 293 pg/mL (ref 200–1100)

## 2021-04-21 LAB — TSH: TSH: 0.52 mIU/L

## 2021-04-21 LAB — C-REACTIVE PROTEIN: CRP: 2.2 mg/L (ref <8.0)

## 2021-04-21 LAB — CK: Total CK: 45 U/L (ref 29–143)

## 2021-04-21 MED ORDER — BUPROPION HCL ER (XL) 150 MG PO TB24: ORAL_TABLET | ORAL | 0 refills | Status: DC

## 2021-05-31 NOTE — Progress Notes (Signed)
COMPLETE PHYSICAL  Assessment and Plan:  Encounter for general adult medical examination with abnormal findings 1 year  Hyperlipidemia Continue medications: Zetia 10mg .  Has failed statin trials in past.  check lipids, decrease fatty foods, increase activity.  -     Lipid panel [place] 15% cardiovascular risk assessment CBC CMP TSH  Obesity -     TSH - long discussion about weight loss, diet, and exercise  Fatigue, Unspecified Type Stop Verapamil- BP and pulse are running low Monitor Fatigue, headache and sleep If no improvement will consider possible sleep study, trazodone for sleep  Benign hematuria Routine urine with reflex microscopic Microalbumin/creatinine urine ratio  Vitamin D deficiency Continue supplementation to maintain goal of 70-100 Taking Vitamin D 50,000IU three times a week. Pt prefer prescription, more consistent with taking than OTC. -     VITAMIN D 25 Hydroxy (Vit-D Deficiency, Fractures)  Medication management -     Magnesium  Female genuine stress incontinence Weight loss advised Discussed dietary and exercise modifications  Anxiety Continue Wellbutrin/ lexapro 20 mg for anxiety  Screening for hematuria or proteinuria -     Urinalysis, Routine w reflex microscopic (not at [facility]) -     Microalbumin / creatinine urine ratio  Other migraine without status migrainosus, not intractable Taking topiromate daily Using Ubrevly 50mg  PRN - working well for patient  Screening for breast Cancer MM Digital Screening  Needs flu shot -     FLU VACCINE QUAD 6+ MOS PF IM  Screening for Ischemic heart disease EKG   Discussed med's effects and SE's. Screening labs and tests as requested with regular follow-up as recommended. Over 40 minutes of face to face interview, exam, counseling, chart review, and complex, high level critical decision making was performed this visit.   Future Appointments  Date Time Provider [place]  06/03/2022  10:00 AM Magda Bernheim, NP [facility] None    HPI  55 y.o. female  presents for a complete physical and follow up for has Hyperlipidemia; Anxiety; Asthma; Migraine; Vitamin D deficiency; Benign hematuria; Female genuine stress incontinence; Obesity (BMI 30.0-34.9); Medication management; Labile hypertension; and B12 deficiency on their problem list..  Her blood pressure has been controlled at home, today their BP is BP: 100/68 BP Readings from Last 3 Encounters:  06/03/21 100/68  04/20/21 104/64  09/23/20 128/76    She does workout, she walks. She denies chest pain, shortness of breath, dizziness.   She is having hot flashes, states she may have depression. She is not sleeping at night, she is having night sweats as well.   She is having increased fatigue, wakes up feeling tired. Is not sleeping well. Has difficulty falling asleep.   She is on verapamil and topamax 25mg  BID, have been doing well until the weather changes. She is on xyzal. Headaches are currently well controlled.  She is on cholesterol medication, she was on crestor but stopped related to adverse side effects.  She is taking Nexlizet 180/10 and tolerating well.  [place] risk assessment 15%. Not taking bASA.  Her cholesterol is not at goal. The cholesterol last visit was:   Lab Results  Component Value Date   CHOL 343 (H) 12/26/2018   HDL 37 (L) 12/26/2018   LDLCALC 244 (H) 12/26/2018   TRIG 364 (H) 12/26/2018   CHOLHDL 9.3 (H) 12/26/2018    Last A1C in the office was:  Lab Results  Component Value Date   HGBA1C 5.6 05/10/2018   Patient is on Vitamin D supplement,  5000 IU daily.   Lab Results  Component Value Date   VD25OH 18 (L) 12/26/2018     BMI is Body mass index is 32.14 kg/m., she is working on diet and exercise. She is eating a lot vegetable and fruits, drinks water. She is walking regularly Wt Readings from Last 3 Encounters:  06/03/21 180 lb (81.6 kg)  04/20/21 181 lb (82.1 kg)  09/23/20 175 lb  (79.4 kg)    Current Medications:  Current Outpatient Medications on File Prior to Visit  Medication Sig Dispense Refill   B Complex Vitamins (VITAMIN-B COMPLEX) TABS Take 1 tablet by mouth.     Bempedoic Acid-Ezetimibe (NEXLIZET) 180-10 MG TABS Take 1 tablet by mouth daily. 90 tablet 3   buPROPion (WELLBUTRIN XL) 150 MG 24 hr tablet Take 1 tab in the morning for mood and energy. 90 tablet 0   escitalopram (LEXAPRO) 20 MG tablet TAKE 1 TABLET BY MOUTH DAILY FOR MOOD 90 tablet 3   levocetirizine (XYZAL) 5 MG tablet TAKE 1 TABLET BY MOUTH DAILY FOR ALLERGIES 90 tablet 1   topiramate (TOPAMAX) 100 MG tablet Take 1 tablet  at Bedtime  for Migraine Prevention & Weight Loss 90 tablet 3   Ubrogepant (UBRELVY) 50 MG TABS Take 50 mg by mouth as needed. 30 tablet 3   verapamil (CALAN-SR) 120 MG CR tablet TAKE 1 TABLET(120 MG) BY MOUTH AT BEDTIME 90 tablet 3   Vitamin D, Ergocalciferol, (DRISDOL) 1.25 MG (50000 UNIT) CAPS capsule TAKE 1 CAPSULE BY MOUTH 3 DAYS A WEEK 36 capsule 1   No current facility-administered medications on file prior to visit.   Allergies:  Allergies  Allergen Reactions   Shellfish Allergy Anaphylaxis   Shellfish-Derived Products Anaphylaxis   Atorvastatin     Severe nausea   Rosuvastatin     Nausea, GI   Medical History:  She has Hyperlipidemia; Anxiety; Asthma; Migraine; Vitamin D deficiency; Benign hematuria; Female genuine stress incontinence; Obesity (BMI 30.0-34.9); Medication management; Labile hypertension; and B12 deficiency on their problem list.   Health Maintenance:   Immunization History  Administered Date(s) Administered   DTaP 10/24/2010   Fluad Quad(high Dose 65+) 05/26/2019   Influenza Inj Mdck Quad With Preservative 05/02/2017, 05/10/2018, 06/03/2020   Influenza Split 05/11/2012, 05/13/2013   Influenza,inj,Quad PF,6+ Mos 05/26/2019, 05/26/2019   Influenza-Unspecified 05/11/2015   PFIZER(Purple Top)SARS-COV-2 Vaccination 08/19/2019, 09/09/2019,  07/10/2020   PPD Test 08/27/2013, 09/23/2014   Pneumococcal Polysaccharide-23 08/27/2013   Tdap 10/24/2010   Tetanus: 2012 Pneumovax: 2015 Prevnar 13: due age 89 Flu vaccine: 2018 Zostavax: N/A LMP: s/p TAH Pap:05/02/2017  MGM: 2016, Dr Garwin Brothers  DEXA: N/A Colonoscopy: 10/2016 CT head 2013  Patient Care Team: Unk Pinto, MD as PCP - General (Internal Medicine) Kathie Rhodes, MD (Inactive) as Consulting Physician (Urology)  Surgical History:  She has a past surgical history that includes LEEP; Abdominal hysterectomy (1998); Tonsilectomy/adenoidectomy with myringotomy; Colonoscopy (2018); and Polypectomy. Family History:  Herfamily history includes Alzheimer's disease in an other family member; Cancer in her maternal aunt and maternal uncle; Depression in an other family member; Diabetes in her paternal grandmother; Healthy in her daughter, sister, and son; Heart disease in her maternal aunt; Hyperlipidemia in her mother; Liver disease in her father; Migraines in her mother. Social History:  She reports that she has been smoking cigarettes. She has never used smokeless tobacco. She reports current alcohol use. She reports that she does not use drugs.  Review of Systems: Review of Systems  Constitutional:  Positive  for malaise/fatigue. Negative for chills and fever.  HENT:  Negative for congestion, hearing loss, sinus pain, sore throat and tinnitus.   Eyes: Negative.  Negative for blurred vision and double vision.  Respiratory: Negative.  Negative for cough, hemoptysis, sputum production, shortness of breath and wheezing.   Cardiovascular: Negative.  Negative for chest pain, palpitations and leg swelling.       Will get occasional shooting pain mid sternal  Gastrointestinal:  Positive for constipation. Negative for abdominal pain, diarrhea, heartburn, nausea and vomiting.  Genitourinary: Negative.  Negative for dysuria and urgency.       Some stress incontinence   Musculoskeletal:  Positive for joint pain. Negative for back pain, falls (pain in shoulders, arms and legs), myalgias and neck pain.  Skin: Negative.  Negative for rash.  Neurological:  Positive for headaches. Negative for dizziness, tingling, tremors, sensory change, speech change, focal weakness, seizures, loss of consciousness and weakness.  Endo/Heme/Allergies: Negative.  Does not bruise/bleed easily.  Psychiatric/Behavioral:  Negative for depression and suicidal ideas. The patient has insomnia. The patient is not nervous/anxious.    Physical Exam: Estimated body mass index is 32.14 kg/m as calculated from the following:   Height as of this encounter: 5' 2.75" (1.594 m).   Weight as of this encounter: 180 lb (81.6 kg). BP 100/68   Pulse 61   Temp (!) 97.2 F (36.2 C)   Ht 5' 2.75" (1.594 m)   Wt 180 lb (81.6 kg)   SpO2 99%   BMI 32.14 kg/m  General Appearance: Well nourished, in no apparent distress.  Eyes: PERRLA, EOMs, conjunctiva no swelling or erythema, normal fundi and vessels.  Sinuses: No Frontal/maxillary tenderness  ENT/Mouth: Ext aud canals clear, normal light reflex with TMs without erythema, bulging. Good dentition. No erythema, swelling, or exudate on post pharynx. Tonsils not swollen or erythematous. Hearing normal.  Neck: Supple, thyroid normal. No bruits  Respiratory: Respiratory effort normal, BS equal bilaterally without rales, rhonchi, wheezing or stridor.  Cardio: RRR without murmurs, rubs or gallops. Brisk peripheral pulses without edema.  Chest: symmetric, with normal excursions and percussion.  Breasts: Symmetric, without lumps, nipple discharge, retractions.  Abdomen: Soft, nontender, no guarding, rebound, hernias, masses, or organomegaly.  Lymphatics: Non tender without lymphadenopathy.  Genitourinary: defer Musculoskeletal: Full ROM all peripheral extremities,5/5 strength, and normal gait.  Skin: Warm, dry without rashes, lesions, ecchymosis. Skin tags  noted, neck.(Make appointment if bothersome for removal). Neuro: Cranial nerves intact, reflexes equal bilaterally. Normal muscle tone, no cerebellar symptoms. Sensation intact.  Psych: Awake and oriented X 3, normal affect, Insight and Judgment appropriate.   EKG: NSR    [facility]  06/03/2021

## 2021-06-03 ENCOUNTER — Ambulatory Visit (INDEPENDENT_AMBULATORY_CARE_PROVIDER_SITE_OTHER): Payer: BC Managed Care – PPO | Admitting: Nurse Practitioner

## 2021-06-03 ENCOUNTER — Other Ambulatory Visit: Payer: Self-pay

## 2021-06-03 ENCOUNTER — Encounter: Payer: Self-pay | Admitting: Nurse Practitioner

## 2021-06-03 VITALS — BP 100/68 | HR 61 | Temp 97.2°F | Ht 62.75 in | Wt 180.0 lb

## 2021-06-03 DIAGNOSIS — E669 Obesity, unspecified: Secondary | ICD-10-CM

## 2021-06-03 DIAGNOSIS — E559 Vitamin D deficiency, unspecified: Secondary | ICD-10-CM

## 2021-06-03 DIAGNOSIS — Z13220 Encounter for screening for lipoid disorders: Secondary | ICD-10-CM | POA: Diagnosis not present

## 2021-06-03 DIAGNOSIS — Z79899 Other long term (current) drug therapy: Secondary | ICD-10-CM

## 2021-06-03 DIAGNOSIS — N029 Recurrent and persistent hematuria with unspecified morphologic changes: Secondary | ICD-10-CM

## 2021-06-03 DIAGNOSIS — Z0000 Encounter for general adult medical examination without abnormal findings: Secondary | ICD-10-CM | POA: Diagnosis not present

## 2021-06-03 DIAGNOSIS — Z1389 Encounter for screening for other disorder: Secondary | ICD-10-CM | POA: Diagnosis not present

## 2021-06-03 DIAGNOSIS — Z136 Encounter for screening for cardiovascular disorders: Secondary | ICD-10-CM

## 2021-06-03 DIAGNOSIS — G43701 Chronic migraine without aura, not intractable, with status migrainosus: Secondary | ICD-10-CM

## 2021-06-03 DIAGNOSIS — F419 Anxiety disorder, unspecified: Secondary | ICD-10-CM

## 2021-06-03 DIAGNOSIS — Z0001 Encounter for general adult medical examination with abnormal findings: Secondary | ICD-10-CM

## 2021-06-03 DIAGNOSIS — Z23 Encounter for immunization: Secondary | ICD-10-CM | POA: Diagnosis not present

## 2021-06-03 DIAGNOSIS — Z1231 Encounter for screening mammogram for malignant neoplasm of breast: Secondary | ICD-10-CM

## 2021-06-03 DIAGNOSIS — E785 Hyperlipidemia, unspecified: Secondary | ICD-10-CM

## 2021-06-03 DIAGNOSIS — R0989 Other specified symptoms and signs involving the circulatory and respiratory systems: Secondary | ICD-10-CM | POA: Diagnosis not present

## 2021-06-03 DIAGNOSIS — R5383 Other fatigue: Secondary | ICD-10-CM

## 2021-06-03 NOTE — Patient Instructions (Signed)
Stop Verapamil and monitor fatigue, headaches and sleep If continue to have increased fatigue will try Trazodone for sleep and if fatigue continues to persist will consider sleep study  [place]   Know what a healthy weight is for you (roughly BMI <25) and aim to maintain this   Aim for 7+ servings of fruits and vegetables daily   70-80+ fluid ounces of water or unsweet tea for healthy kidneys   Limit to max 1 drink of alcohol per day; avoid smoking/tobacco   Limit animal fats in diet for cholesterol and heart health - choose grass fed whenever available   Avoid highly processed foods, and foods high in saturated/trans fats   Aim for low stress - take time to unwind and care for your mental health   Aim for 150 min of moderate intensity exercise weekly for heart health, and weights twice weekly for bone health   Aim for 7-9 hours of sleep daily

## 2021-06-04 LAB — URINALYSIS, ROUTINE W REFLEX MICROSCOPIC
Bilirubin Urine: NEGATIVE
Glucose, UA: NEGATIVE
Hyaline Cast: NONE SEEN /LPF
Ketones, ur: NEGATIVE
Leukocytes,Ua: NEGATIVE
Nitrite: NEGATIVE
Protein, ur: NEGATIVE
Specific Gravity, Urine: 1.019 (ref 1.001–1.035)
pH: 5.5 (ref 5.0–8.0)

## 2021-06-04 LAB — CBC WITH DIFFERENTIAL/PLATELET
Absolute Monocytes: 616 cells/uL (ref 200–950)
Basophils Absolute: 30 cells/uL (ref 0–200)
Basophils Relative: 0.4 %
Eosinophils Absolute: 137 cells/uL (ref 15–500)
Eosinophils Relative: 1.8 %
HCT: 42.8 % (ref 35.0–45.0)
Hemoglobin: 14.1 g/dL (ref 11.7–15.5)
Lymphs Abs: 3678 cells/uL (ref 850–3900)
MCH: 28.3 pg (ref 27.0–33.0)
MCHC: 32.9 g/dL (ref 32.0–36.0)
MCV: 85.9 fL (ref 80.0–100.0)
MPV: 9.2 fL (ref 7.5–12.5)
Monocytes Relative: 8.1 %
Neutro Abs: 3139 cells/uL (ref 1500–7800)
Neutrophils Relative %: 41.3 %
Platelets: 280 10*3/uL (ref 140–400)
RBC: 4.98 10*6/uL (ref 3.80–5.10)
RDW: 12.4 % (ref 11.0–15.0)
Total Lymphocyte: 48.4 %
WBC: 7.6 10*3/uL (ref 3.8–10.8)

## 2021-06-04 LAB — COMPLETE METABOLIC PANEL WITH GFR
AG Ratio: 1.7 (calc) (ref 1.0–2.5)
ALT: 9 U/L (ref 6–29)
AST: 11 U/L (ref 10–35)
Albumin: 4 g/dL (ref 3.6–5.1)
Alkaline phosphatase (APISO): 92 U/L (ref 37–153)
BUN: 11 mg/dL (ref 7–25)
CO2: 26 mmol/L (ref 20–32)
Calcium: 9.4 mg/dL (ref 8.6–10.4)
Chloride: 110 mmol/L (ref 98–110)
Creat: 0.78 mg/dL (ref 0.50–1.03)
Globulin: 2.4 g/dL (calc) (ref 1.9–3.7)
Glucose, Bld: 75 mg/dL (ref 65–99)
Potassium: 4.4 mmol/L (ref 3.5–5.3)
Sodium: 144 mmol/L (ref 135–146)
Total Bilirubin: 0.2 mg/dL (ref 0.2–1.2)
Total Protein: 6.4 g/dL (ref 6.1–8.1)
eGFR: 90 mL/min/{1.73_m2} (ref >=60)

## 2021-06-04 LAB — MAGNESIUM: Magnesium: 2 mg/dL (ref 1.5–2.5)

## 2021-06-04 LAB — MICROALBUMIN / CREATININE URINE RATIO
Creatinine, Urine: 119 mg/dL (ref 20–275)
Microalb Creat Ratio: 5 mcg/mg creat (ref <30)
Microalb, Ur: 0.6 mg/dL

## 2021-06-04 LAB — LIPID PANEL
Cholesterol: 392 mg/dL — ABNORMAL HIGH (ref <200)
HDL: 41 mg/dL — ABNORMAL LOW (ref >=50)
LDL Cholesterol (Calc): 319 mg/dL (calc) — ABNORMAL HIGH
Non-HDL Cholesterol (Calc): 351 mg/dL (calc) — ABNORMAL HIGH (ref <130)
Total CHOL/HDL Ratio: 9.6 (calc) — ABNORMAL HIGH (ref <5.0)
Triglycerides: 153 mg/dL — ABNORMAL HIGH (ref <150)

## 2021-06-04 LAB — MICROSCOPIC MESSAGE

## 2021-06-04 LAB — TSH: TSH: 0.5 mIU/L

## 2021-06-14 ENCOUNTER — Telehealth: Payer: Self-pay | Admitting: Nurse Practitioner

## 2021-06-14 ENCOUNTER — Other Ambulatory Visit: Payer: Self-pay | Admitting: Nurse Practitioner

## 2021-06-14 ENCOUNTER — Telehealth: Payer: Self-pay

## 2021-06-14 DIAGNOSIS — N6320 Unspecified lump in the left breast, unspecified quadrant: Secondary | ICD-10-CM

## 2021-06-14 NOTE — Telephone Encounter (Signed)
Patient called and said she needs an order for a diagnostic mammogram sent to the [facility].

## 2021-06-14 NOTE — Telephone Encounter (Signed)
returned call to patient; LVM to call back and schedule appt to evaluate c/o breast tenderness reported to [place]. The patient  had called  w/ c/o tenderness in her breast. Patient was advised by [place] unable to schedule screening, contact provider to evaluate issue  and order needed for diagnostic mammogram.

## 2021-07-15 ENCOUNTER — Ambulatory Visit
Admission: RE | Admit: 2021-07-15 | Discharge: 2021-07-15 | Disposition: A | Discharge status: Home / Self Care | Payer: BC Managed Care – PPO | Source: Ambulatory Visit | Attending: Nurse Practitioner | Admitting: Nurse Practitioner

## 2021-07-15 DIAGNOSIS — N6320 Unspecified lump in the left breast, unspecified quadrant: Secondary | ICD-10-CM

## 2021-07-15 IMAGING — MG DIGITAL DIAGNOSTIC BILAT W/ TOMO W/ CAD
6 of 12 series · 6 of 36 positions shown · non-contrast
Comparison: Previous exam(s).

CLINICAL DATA: 55-year-old female presenting with tenderness and a
possible lump in the retroareolar left breast for approximately 1
month.

EXAM:
DIGITAL DIAGNOSTIC BILATERAL MAMMOGRAM WITH TOMOSYNTHESIS AND CAD;
ULTRASOUND LEFT BREAST LIMITED
TECHNIQUE: Bilateral digital diagnostic mammography and breast tomosynthesis
was performed. The images were evaluated with computer-aided
detection.; Targeted ultrasound examination of the left breast was
performed.

[R CC synth-2D]
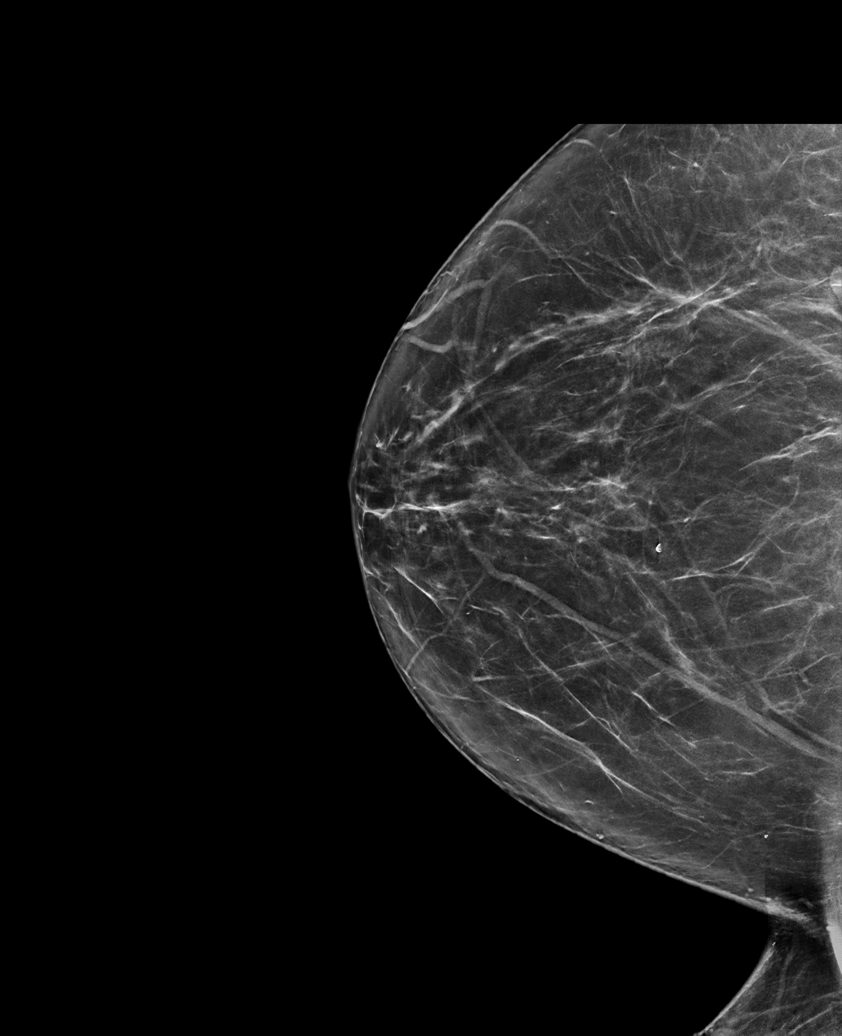

[L MLO synth-2D (1 of 2)]
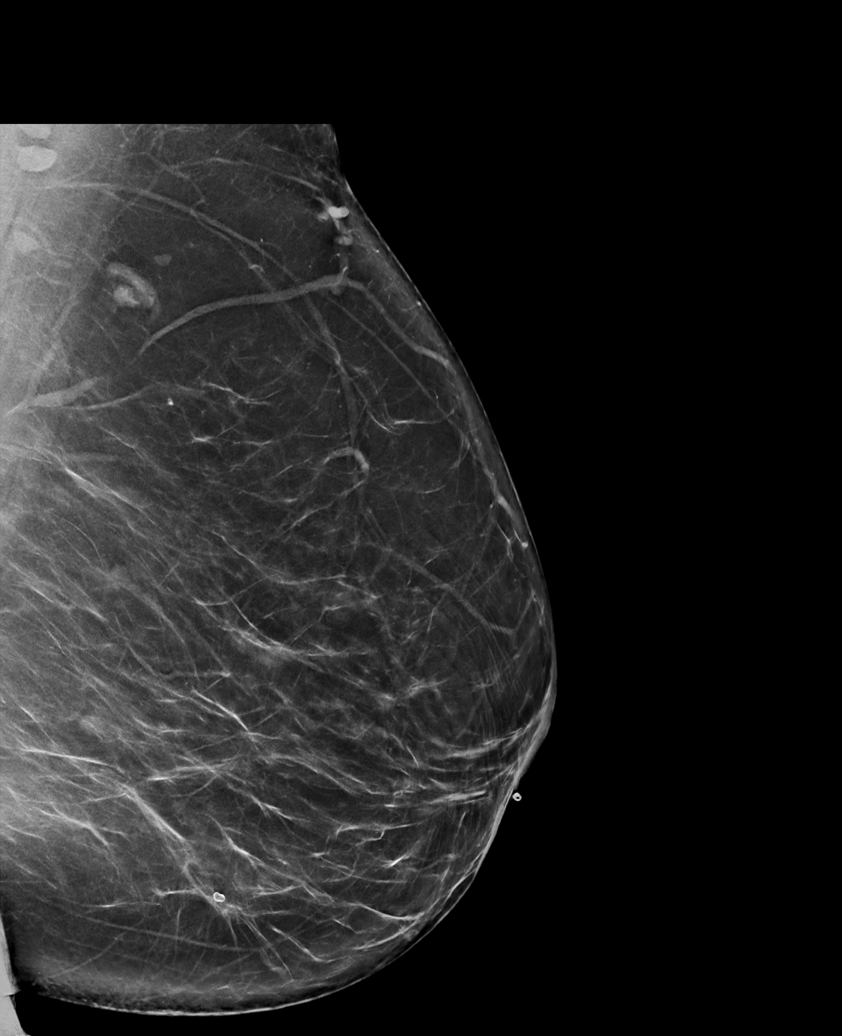

[L CC synth-2D]
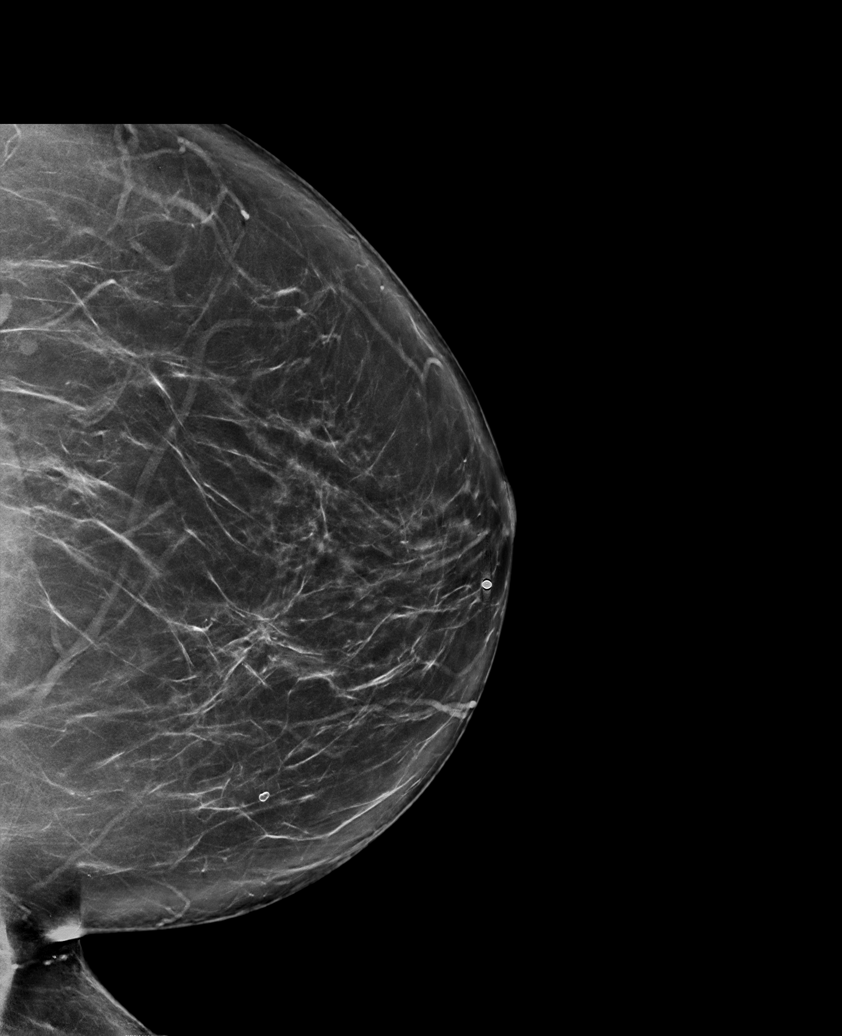

[L MLO synth-2D (2 of 2)]
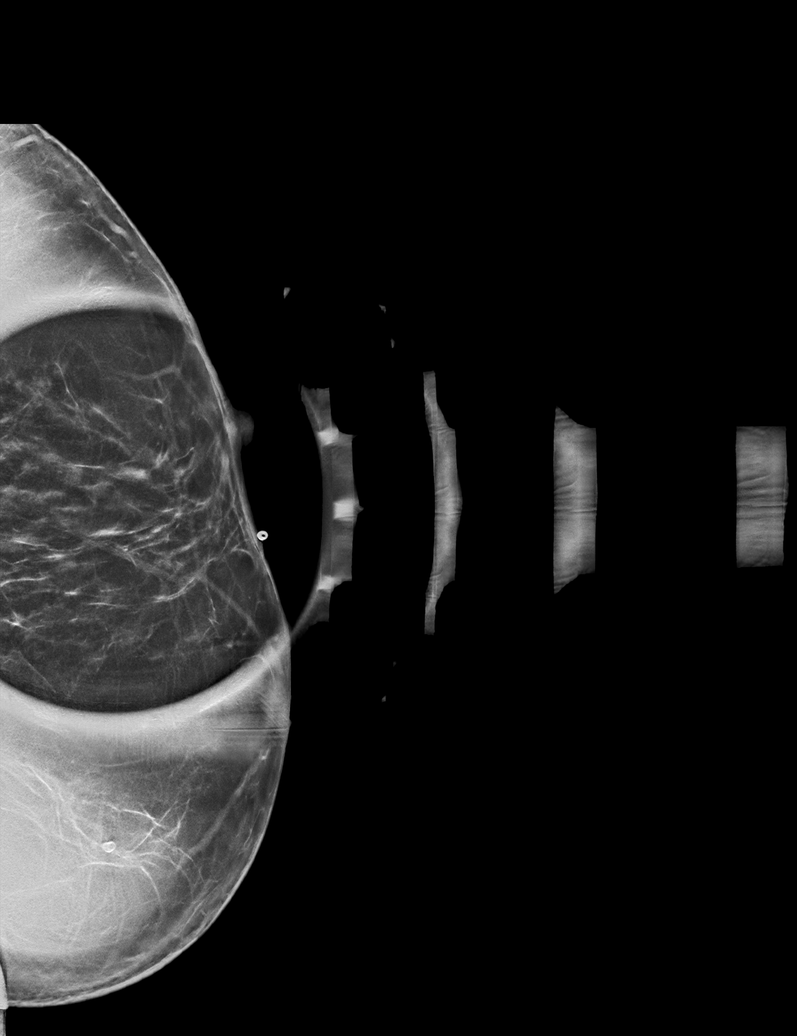

[R XCCL synth-2D]
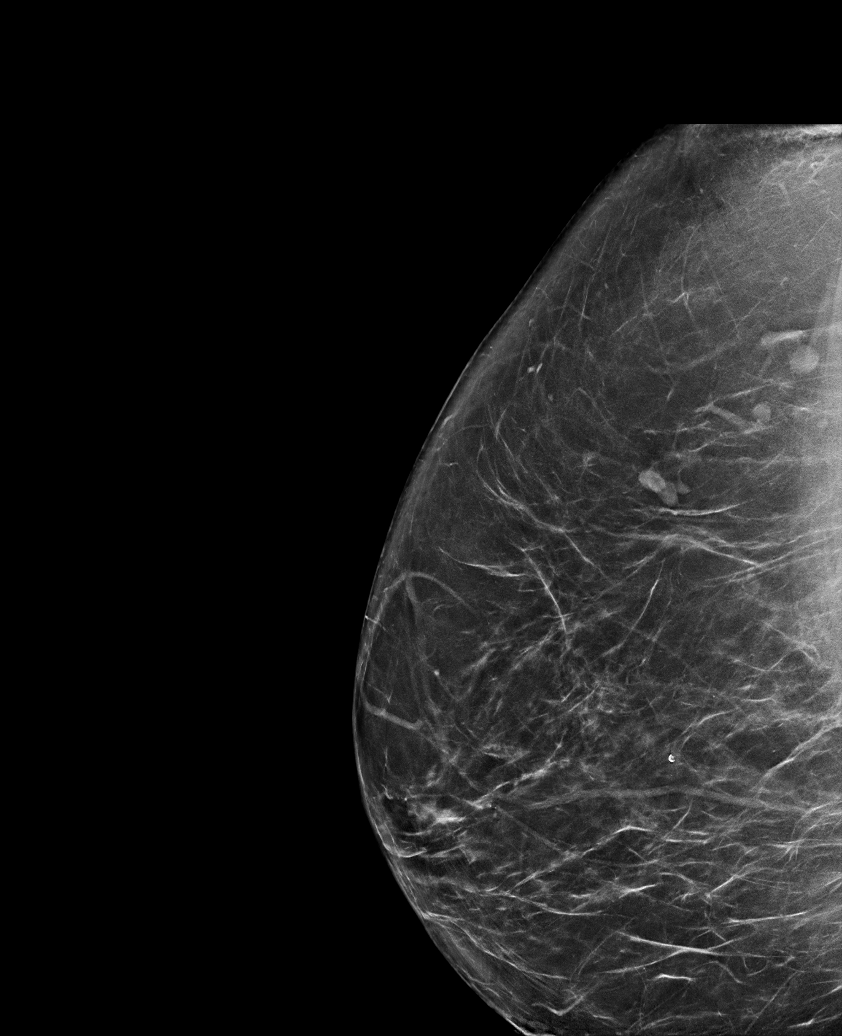

[R MLO synth-2D]
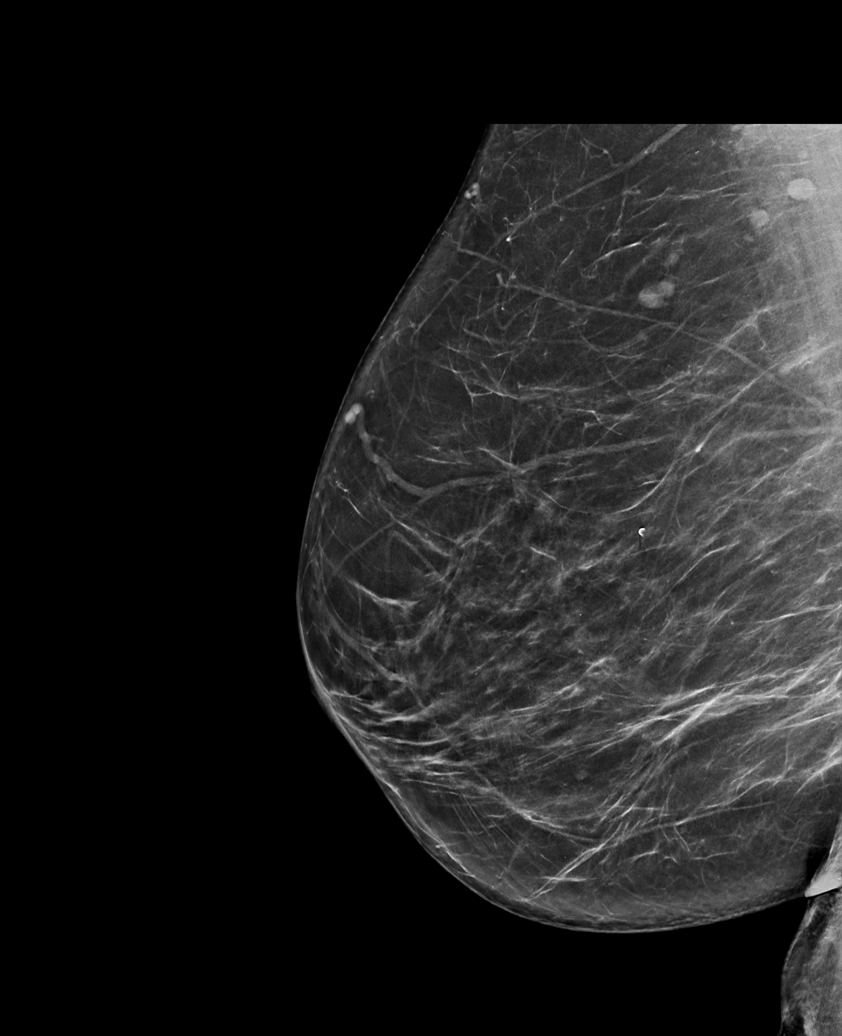

[6 of 36 positions shown; findings below may reference images not displayed]

ACR Breast Density Category b: There are scattered areas of
fibroglandular density.
FINDINGS: Mammogram:

Right breast: No suspicious mass, distortion, or microcalcifications
are identified to suggest presence of malignancy.

Left breast: A skin BB marks the palpable/painful site reported by
the patient in the retroareolar left breast. A spot tangential view
of this area was performed in addition to standard views. There is
no new abnormality at the palpable site or elsewhere in the left
breast to suggest the presence of malignancy.

On physical exam at the site of concern reported by the patient I do
not feel a discrete mass or focal area of thickening.

Ultrasound:

Targeted ultrasound performed at the site of concern reported by the
patient in the left breast at 6 o'clock 1 cm from the nipple
demonstrating no cystic or solid mass. No dilated duct. No focal
fluid collection.
IMPRESSION: 1. No mammographic or sonographic evidence of malignancy or other
imaging abnormality at the palpable/painful site of concern reported
by the patient in the left breast.

2.  No mammographic evidence of malignancy in the right breast.

RECOMMENDATION:
1. Recommend any further workup of the painful/palpable site in the
left breast be on a clinical basis.

2.  Screening mammogram in one year.(Code:[DF])

I have discussed the findings and recommendations with the patient.
If applicable, a reminder letter will be sent to the patient
regarding the next appointment.

BI-RADS CATEGORY  1: Negative.

## 2021-07-15 IMAGING — US US BREAST*L* LIMITED INC AXILLA
1 series · 2 of 2 positions shown · non-contrast
Comparison: Previous exam(s).

CLINICAL DATA: 55-year-old female presenting with tenderness and a
possible lump in the retroareolar left breast for approximately 1
month.

EXAM:
DIGITAL DIAGNOSTIC BILATERAL MAMMOGRAM WITH TOMOSYNTHESIS AND CAD;
ULTRASOUND LEFT BREAST LIMITED
TECHNIQUE: Bilateral digital diagnostic mammography and breast tomosynthesis
was performed. The images were evaluated with computer-aided
detection.; Targeted ultrasound examination of the left breast was
performed.

[Series 1: us breast*left* limited inc axilla · 0.06mm/px · 2 of 2 slices shown]
[im 1/2]
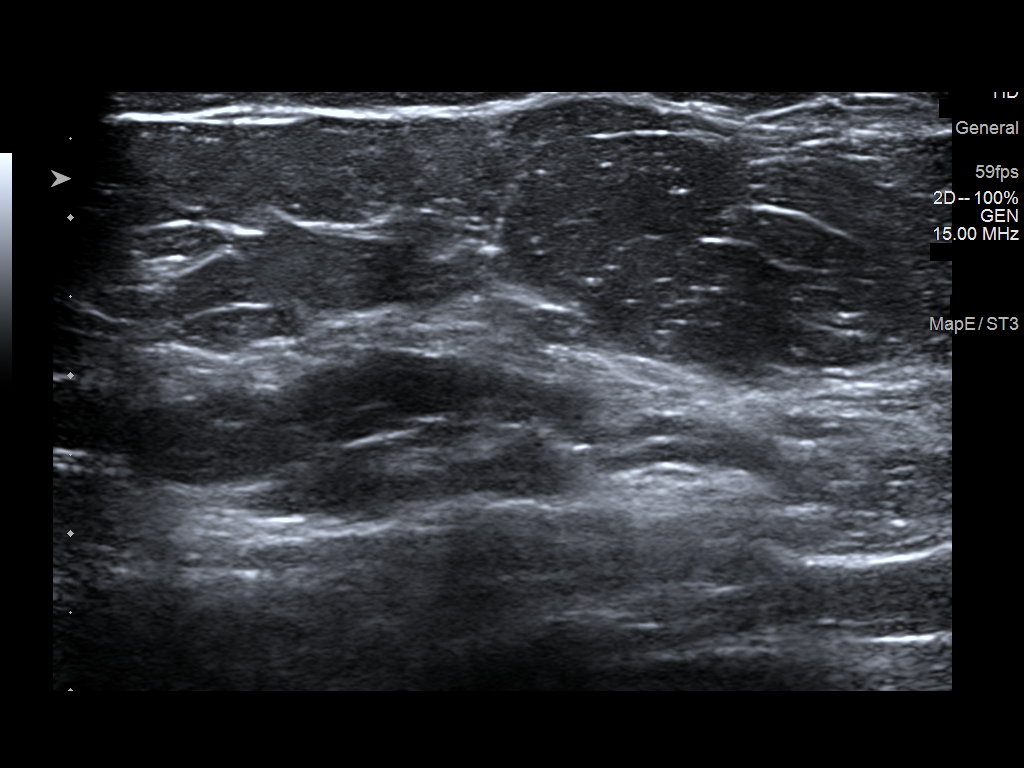
[im 2/2]
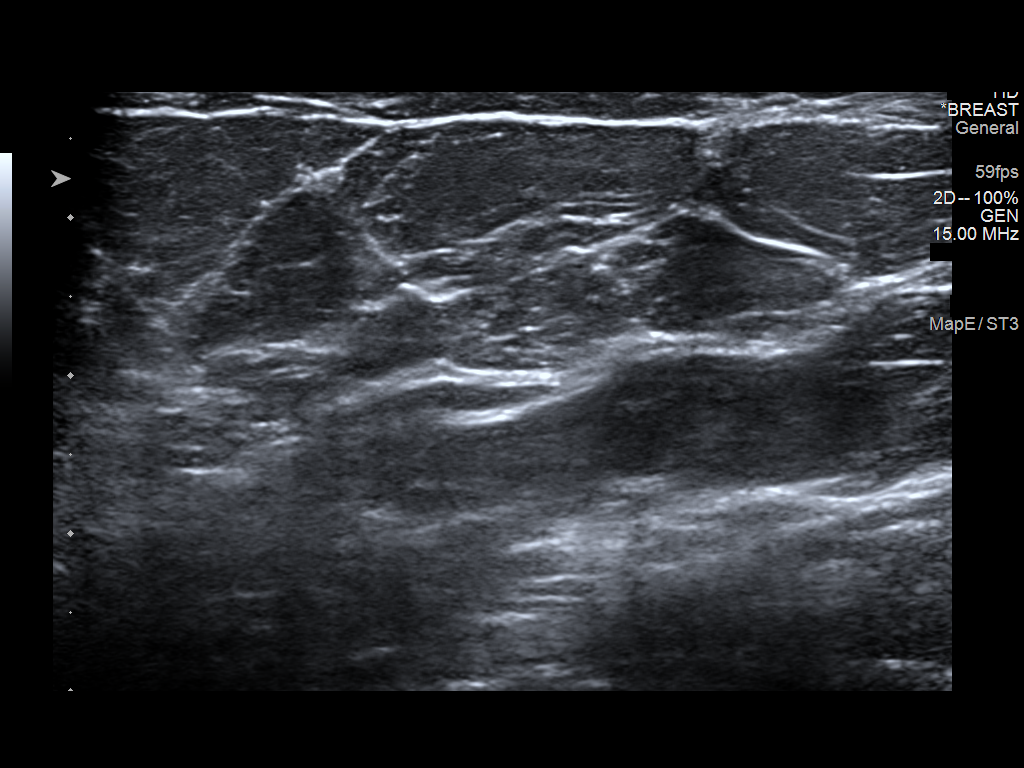

[2 of 2 positions shown; findings below may reference images not displayed]

ACR Breast Density Category b: There are scattered areas of
fibroglandular density.
FINDINGS: Mammogram:

Right breast: No suspicious mass, distortion, or microcalcifications
are identified to suggest presence of malignancy.

Left breast: A skin BB marks the palpable/painful site reported by
the patient in the retroareolar left breast. A spot tangential view
of this area was performed in addition to standard views. There is
no new abnormality at the palpable site or elsewhere in the left
breast to suggest the presence of malignancy.

On physical exam at the site of concern reported by the patient I do
not feel a discrete mass or focal area of thickening.

Ultrasound:

Targeted ultrasound performed at the site of concern reported by the
patient in the left breast at 6 o'clock 1 cm from the nipple
demonstrating no cystic or solid mass. No dilated duct. No focal
fluid collection.
IMPRESSION: 1. No mammographic or sonographic evidence of malignancy or other
imaging abnormality at the palpable/painful site of concern reported
by the patient in the left breast.

2.  No mammographic evidence of malignancy in the right breast.

RECOMMENDATION:
1. Recommend any further workup of the painful/palpable site in the
left breast be on a clinical basis.

2.  Screening mammogram in one year.(Code:[DF])

I have discussed the findings and recommendations with the patient.
If applicable, a reminder letter will be sent to the patient
regarding the next appointment.

BI-RADS CATEGORY  1: Negative.

## 2021-07-16 ENCOUNTER — Other Ambulatory Visit: Payer: Self-pay | Admitting: Adult Health

## 2021-10-14 ENCOUNTER — Other Ambulatory Visit: Payer: Self-pay | Admitting: Nurse Practitioner

## 2021-10-20 ENCOUNTER — Telehealth: Payer: Self-pay

## 2021-10-20 ENCOUNTER — Other Ambulatory Visit: Payer: Self-pay | Admitting: Adult Health

## 2021-10-20 DIAGNOSIS — J301 Allergic rhinitis due to pollen: Secondary | ICD-10-CM

## 2021-10-20 NOTE — Telephone Encounter (Signed)
Ubrelvy '50mg'$  tablets prior authorization complete and approved.  ?

## 2021-11-11 ENCOUNTER — Other Ambulatory Visit: Payer: Self-pay | Admitting: Adult Health Nurse Practitioner

## 2021-11-11 DIAGNOSIS — F419 Anxiety disorder, unspecified: Secondary | ICD-10-CM

## 2021-12-10 ENCOUNTER — Inpatient Hospital Stay: Admit: 2021-12-10 | Payer: BC Managed Care – PPO

## 2021-12-10 ENCOUNTER — Emergency Department (HOSPITAL_COMMUNITY): Payer: BC Managed Care – PPO

## 2021-12-10 ENCOUNTER — Other Ambulatory Visit: Payer: Self-pay

## 2021-12-10 ENCOUNTER — Observation Stay (HOSPITAL_COMMUNITY)
Admission: EM | Admit: 2021-12-10 | Discharge: 2021-12-11 | Disposition: A | Discharge status: Home / Self Care | Payer: BC Managed Care – PPO | Attending: Internal Medicine | Admitting: Internal Medicine

## 2021-12-10 ENCOUNTER — Ambulatory Visit (INDEPENDENT_AMBULATORY_CARE_PROVIDER_SITE_OTHER): Payer: BC Managed Care – PPO | Admitting: Nurse Practitioner

## 2021-12-10 ENCOUNTER — Encounter (HOSPITAL_COMMUNITY): Payer: Self-pay

## 2021-12-10 ENCOUNTER — Encounter (HOSPITAL_COMMUNITY)
Admission: EM | Disposition: A | Discharge status: Home / Self Care | Payer: Self-pay | Source: Home / Self Care | Attending: Emergency Medicine

## 2021-12-10 VITALS — HR 62 | Temp 96.8°F | Wt 173.0 lb

## 2021-12-10 DIAGNOSIS — R5383 Other fatigue: Secondary | ICD-10-CM

## 2021-12-10 DIAGNOSIS — I10 Essential (primary) hypertension: Secondary | ICD-10-CM | POA: Insufficient documentation

## 2021-12-10 DIAGNOSIS — E66811 Obesity, class 1: Secondary | ICD-10-CM | POA: Diagnosis present

## 2021-12-10 DIAGNOSIS — J45909 Unspecified asthma, uncomplicated: Secondary | ICD-10-CM | POA: Insufficient documentation

## 2021-12-10 DIAGNOSIS — F17210 Nicotine dependence, cigarettes, uncomplicated: Secondary | ICD-10-CM | POA: Insufficient documentation

## 2021-12-10 DIAGNOSIS — R200 Anesthesia of skin: Secondary | ICD-10-CM

## 2021-12-10 DIAGNOSIS — G459 Transient cerebral ischemic attack, unspecified: Principal | ICD-10-CM | POA: Diagnosis present

## 2021-12-10 DIAGNOSIS — R479 Unspecified speech disturbances: Secondary | ICD-10-CM | POA: Diagnosis not present

## 2021-12-10 DIAGNOSIS — R464 Slowness and poor responsiveness: Secondary | ICD-10-CM | POA: Diagnosis not present

## 2021-12-10 DIAGNOSIS — R202 Paresthesia of skin: Secondary | ICD-10-CM | POA: Diagnosis not present

## 2021-12-10 DIAGNOSIS — F419 Anxiety disorder, unspecified: Secondary | ICD-10-CM | POA: Diagnosis present

## 2021-12-10 DIAGNOSIS — G43909 Migraine, unspecified, not intractable, without status migrainosus: Secondary | ICD-10-CM | POA: Diagnosis present

## 2021-12-10 DIAGNOSIS — Z6834 Body mass index (BMI) 34.0-34.9, adult: Secondary | ICD-10-CM | POA: Insufficient documentation

## 2021-12-10 DIAGNOSIS — Z79899 Other long term (current) drug therapy: Secondary | ICD-10-CM | POA: Diagnosis not present

## 2021-12-10 DIAGNOSIS — E669 Obesity, unspecified: Secondary | ICD-10-CM | POA: Diagnosis not present

## 2021-12-10 DIAGNOSIS — R29810 Facial weakness: Secondary | ICD-10-CM | POA: Diagnosis present

## 2021-12-10 LAB — I-STAT CHEM 8, ED
BUN: 7 mg/dL (ref 6–20)
Calcium, Ion: 1.24 mmol/L (ref 1.15–1.40)
Chloride: 108 mmol/L (ref 98–111)
Creatinine, Ser: 0.7 mg/dL (ref 0.44–1.00)
Glucose, Bld: 110 mg/dL — ABNORMAL HIGH (ref 70–99)
HCT: 47 % — ABNORMAL HIGH (ref 36.0–46.0)
Hemoglobin: 16 g/dL — ABNORMAL HIGH (ref 12.0–15.0)
Potassium: 4.3 mmol/L (ref 3.5–5.1)
Sodium: 140 mmol/L (ref 135–145)
TCO2: 22 mmol/L (ref 22–32)

## 2021-12-10 LAB — DIFFERENTIAL
Abs Immature Granulocytes: 0.03 10*3/uL (ref 0.00–0.07)
Basophils Absolute: 0.1 10*3/uL (ref 0.0–0.1)
Basophils Relative: 1 %
Eosinophils Absolute: 0.1 10*3/uL (ref 0.0–0.5)
Eosinophils Relative: 2 %
Immature Granulocytes: 0 %
Lymphocytes Relative: 46 %
Lymphs Abs: 4.1 10*3/uL — ABNORMAL HIGH (ref 0.7–4.0)
Monocytes Absolute: 0.6 10*3/uL (ref 0.1–1.0)
Monocytes Relative: 6 %
Neutro Abs: 4 10*3/uL (ref 1.7–7.7)
Neutrophils Relative %: 45 %

## 2021-12-10 LAB — CBC
HCT: 47 % — ABNORMAL HIGH (ref 36.0–46.0)
HCT: 44.3 % (ref 36.0–46.0)
Hemoglobin: 15.3 g/dL — ABNORMAL HIGH (ref 12.0–15.0)
Hemoglobin: 14.3 g/dL (ref 12.0–15.0)
MCH: 28 pg (ref 26.0–34.0)
MCH: 27.8 pg (ref 26.0–34.0)
MCHC: 32.6 g/dL (ref 30.0–36.0)
MCHC: 32.3 g/dL (ref 30.0–36.0)
MCV: 86.1 fL (ref 80.0–100.0)
MCV: 86.2 fL (ref 80.0–100.0)
Platelets: 261 10*3/uL (ref 150–400)
Platelets: 272 10*3/uL (ref 150–400)
RBC: 5.46 MIL/uL — ABNORMAL HIGH (ref 3.87–5.11)
RBC: 5.14 MIL/uL — ABNORMAL HIGH (ref 3.87–5.11)
RDW: 12.7 % (ref 11.5–15.5)
RDW: 12.5 % (ref 11.5–15.5)
WBC: 8.8 10*3/uL (ref 4.0–10.5)
WBC: 8.7 10*3/uL (ref 4.0–10.5)
nRBC: 0 % (ref 0.0–0.2)
nRBC: 0 % (ref 0.0–0.2)

## 2021-12-10 LAB — COMPREHENSIVE METABOLIC PANEL
ALT: 13 U/L (ref 0–44)
AST: 13 U/L — ABNORMAL LOW (ref 15–41)
Albumin: 3.7 g/dL (ref 3.5–5.0)
Alkaline Phosphatase: 103 U/L (ref 38–126)
Anion gap: 7 (ref 5–15)
BUN: 7 mg/dL (ref 6–20)
CO2: 22 mmol/L (ref 22–32)
Calcium: 9.5 mg/dL (ref 8.9–10.3)
Chloride: 110 mmol/L (ref 98–111)
Creatinine, Ser: 0.8 mg/dL (ref 0.44–1.00)
GFR, Estimated: >60 mL/min (ref >60)
Glucose, Bld: 109 mg/dL — ABNORMAL HIGH (ref 70–99)
Potassium: 4.3 mmol/L (ref 3.5–5.1)
Sodium: 139 mmol/L (ref 135–145)
Total Bilirubin: 0.5 mg/dL (ref 0.3–1.2)
Total Protein: 6.6 g/dL (ref 6.5–8.1)

## 2021-12-10 LAB — CREATININE, SERUM
Creatinine, Ser: 0.76 mg/dL (ref 0.44–1.00)
GFR, Estimated: >60 mL/min (ref >60)

## 2021-12-10 LAB — HEMOGLOBIN A1C
Hgb A1c MFr Bld: 5.4 % (ref 4.8–5.6)
Mean Plasma Glucose: 108.28 mg/dL

## 2021-12-10 LAB — CBG MONITORING, ED: Glucose-Capillary: 119 mg/dL — ABNORMAL HIGH (ref 70–99)

## 2021-12-10 LAB — HIV ANTIBODY (ROUTINE TESTING W REFLEX): HIV Screen 4th Generation wRfx: NONREACTIVE

## 2021-12-10 IMAGING — CT CT HEAD CODE STROKE
3 of 4 series · 13 of 47 positions shown, 15 images · non-contrast
Comparison: CT head [DATE]

CLINICAL DATA: Code stroke.



[Series 3: head 5.0 st · axial · 0.46mm/px · z∈[-122,-7]mm · 7 of 31 slices shown, 9 images]
[im 4/31  brain]
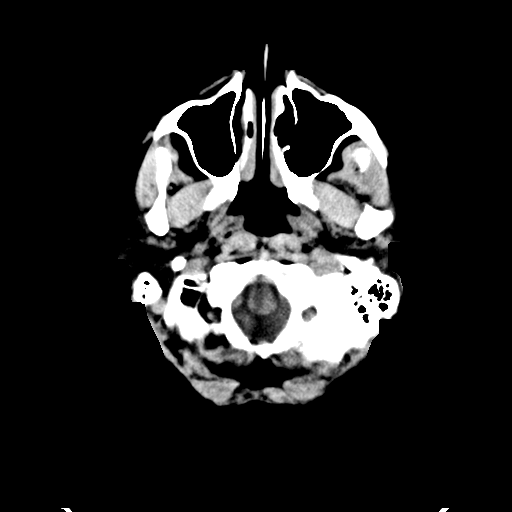
[im 4/31  bone]
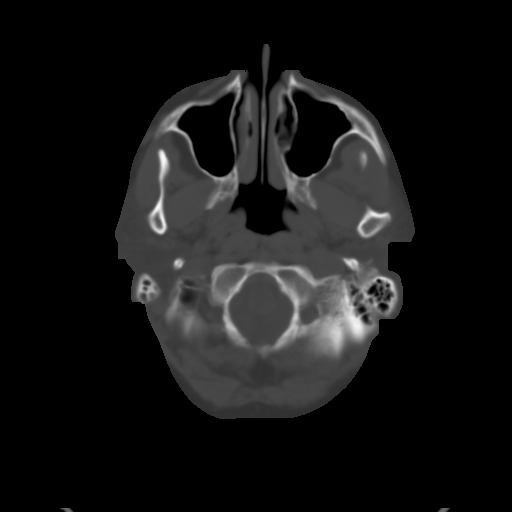
[im 8/31  brain]
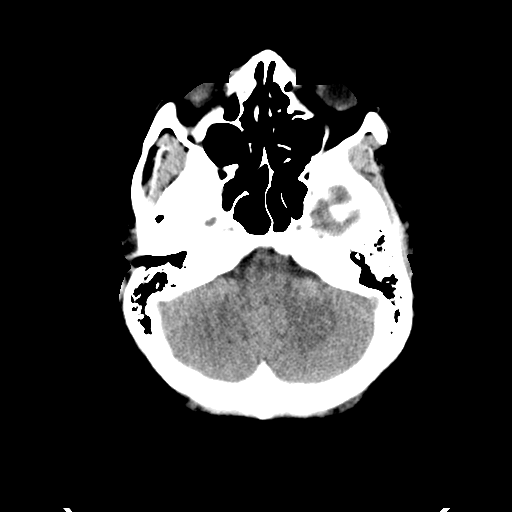
[im 12/31  brain]
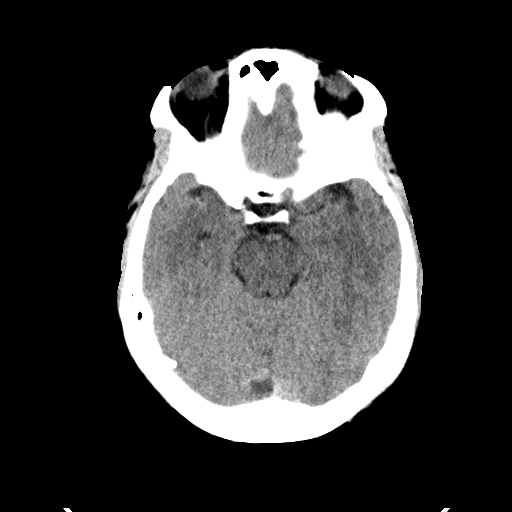
[im 16/31  brain]
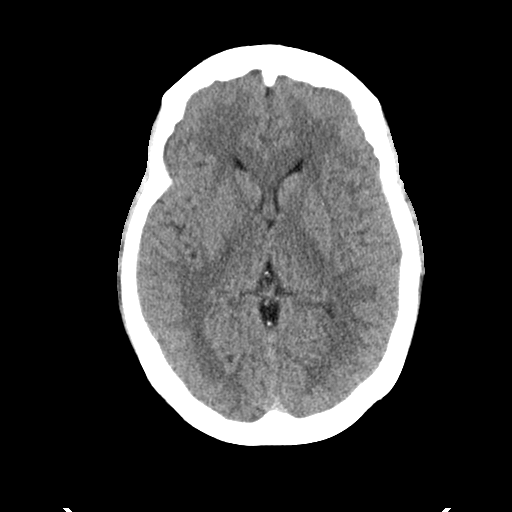
[im 19/31  brain]
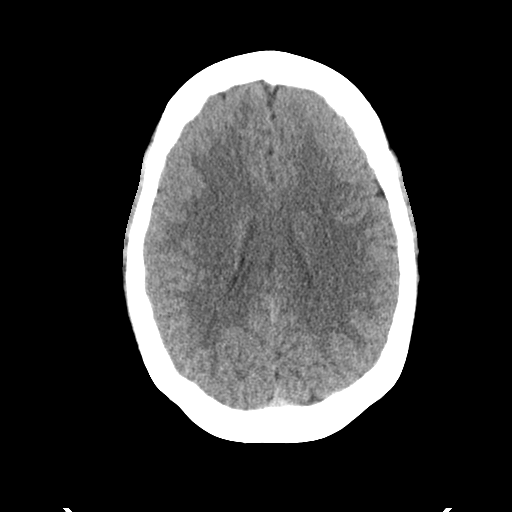
[im 19/31  bone]
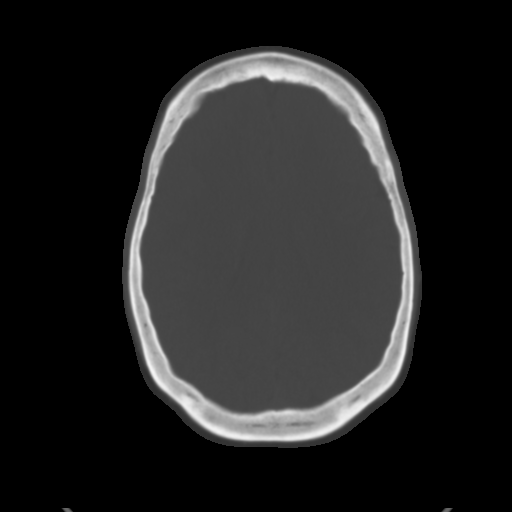
[im 23/31  brain]
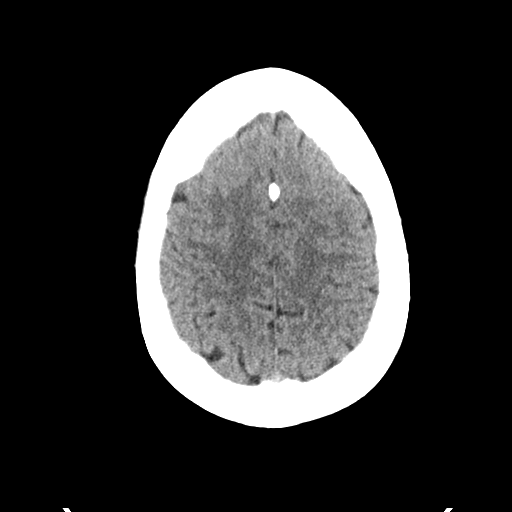
[im 27/31  brain]
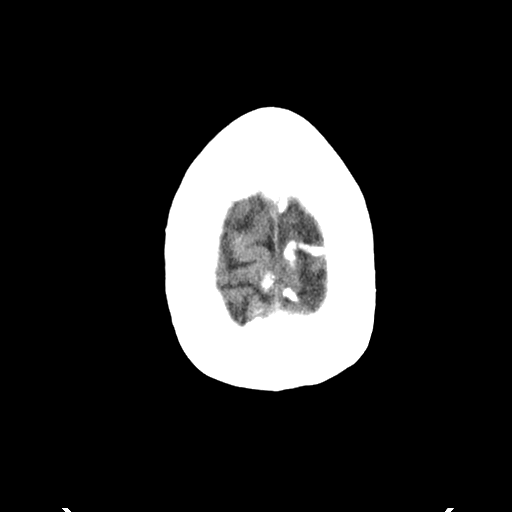

[Series 5: head 3.0 cor st · coronal · 0.30mm/px · 3 of 79 slices shown]
[im 27/79  brain]
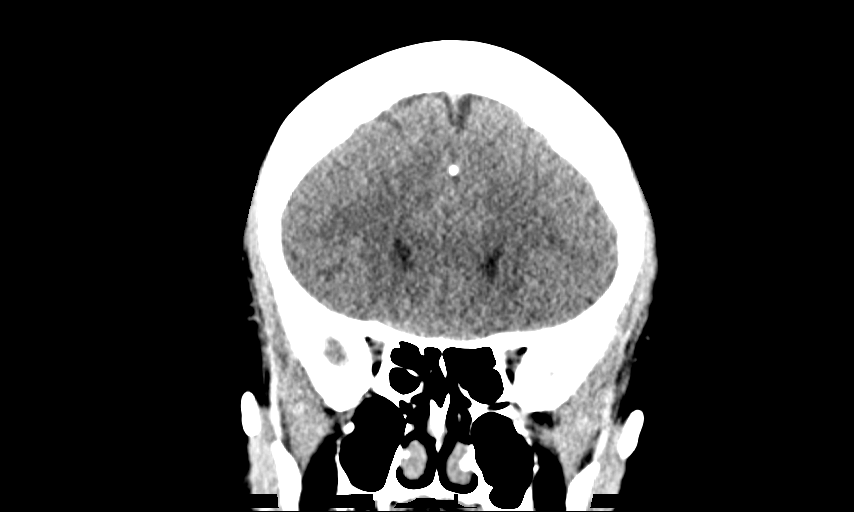
[im 35/79  brain]
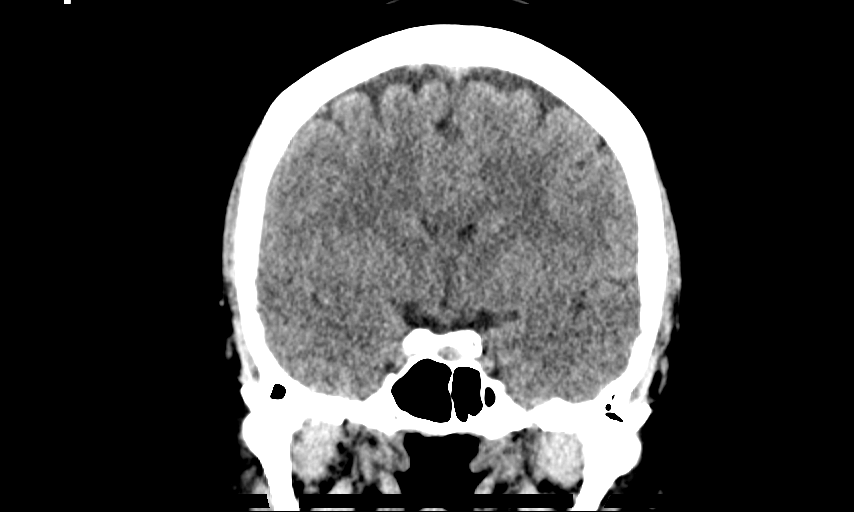
[im 44/79  brain]
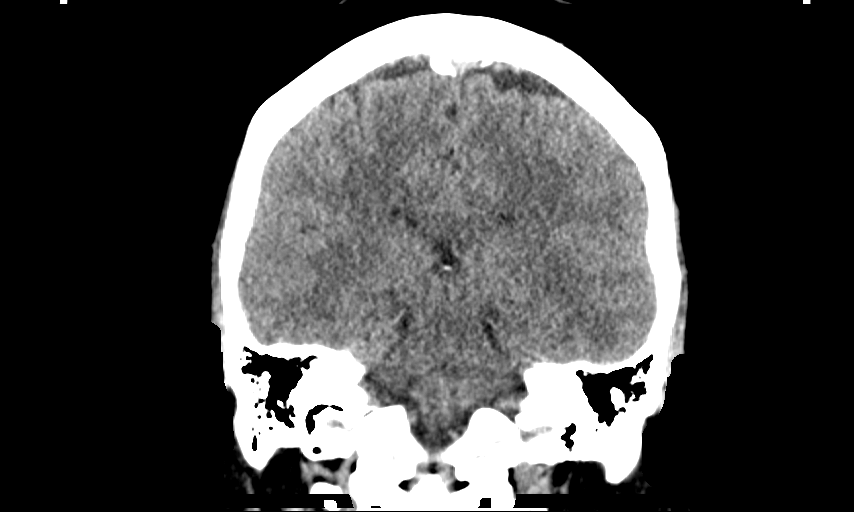

[Series 6: head 3.0 sag st · sagittal · 0.30mm/px · 3 of 67 slices shown]
[im 23/67  brain]
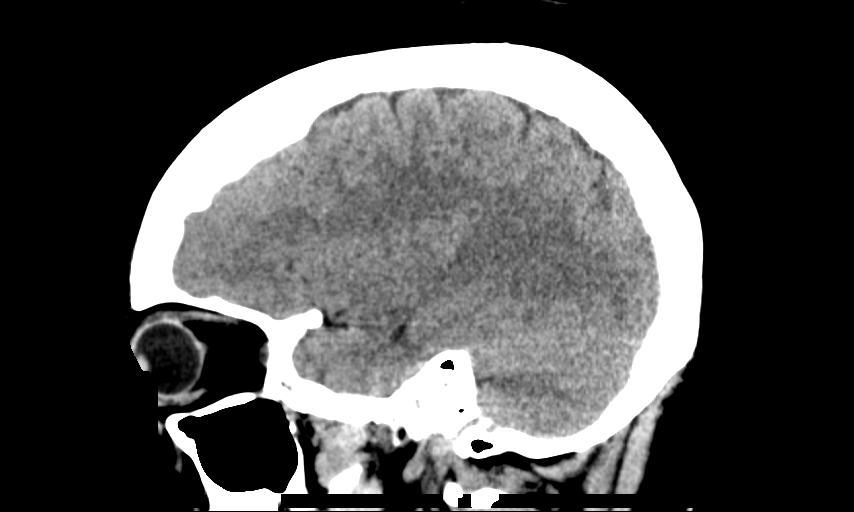
[im 34/67  brain]
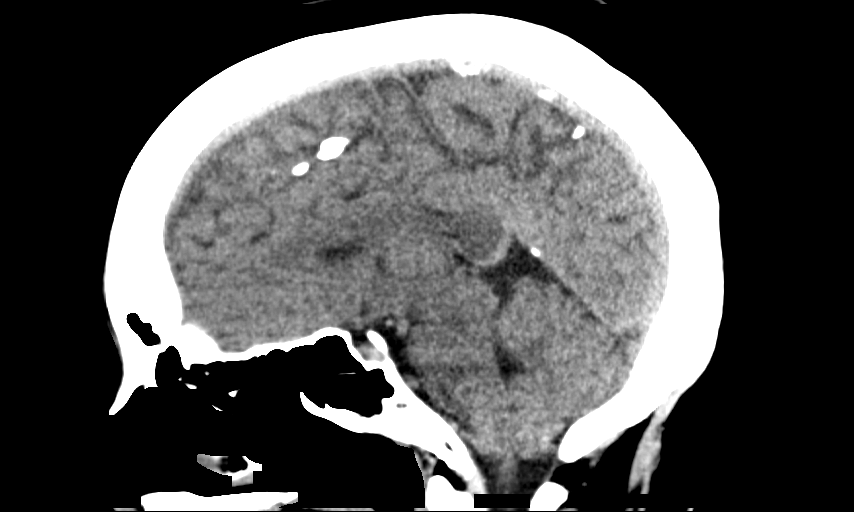
[im 45/67  brain]
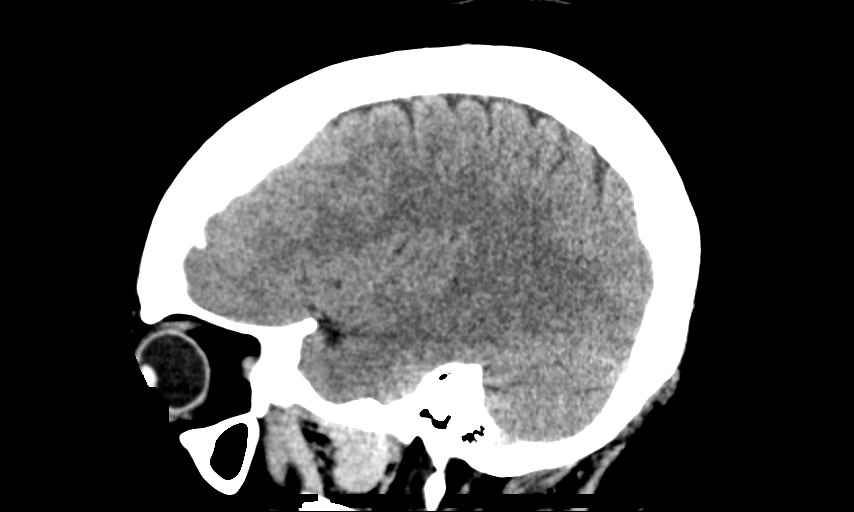

[13 of 47 positions shown; findings below may reference images not displayed]

FINDINGS: Brain: There is no acute intracranial hemorrhage, extra-axial fluid
collection, or acute infarct.

Parenchymal volume is normal. The ventricles are normal in size.
Gray-white differentiation is preserved.

There is no mass lesion.  There is no mass effect or midline shift.

Vascular: No dense vessel is seen.

Skull: Normal. Negative for fracture or focal lesion.

Sinuses/Orbits: The paranasal sinuses are clear. The globes and
orbits are unremarkable.

Other: None.

ASPECTS (Alberta Stroke Program Early CT Score)

- Ganglionic level infarction (caudate, lentiform nuclei, internal
capsule, insula, M1-M3 cortex): 7

- Supraganglionic infarction (M4-M6 cortex): 3

Total score (0-10 with 10 being normal): 10
IMPRESSION: 1. No acute intracranial pathology.
2. ASPECTS is 10

These results were paged via AMION at the time of interpretation on
[DATE] at [DATE] to provider Dr BLAIN.

## 2021-12-10 IMAGING — MR MR HEAD W/O CM
12 of 13 series · 44 of 48 positions shown · non-contrast
Comparison: None Available.

CLINICAL DATA: Neuro deficit, acute, stroke suspected; right-sided
weakness, facial droop and slurred speech

EXAM:
MRI HEAD WITHOUT CONTRAST
TECHNIQUE: Multiplanar, multiecho pulse sequences of the brain and surrounding
structures were obtained without intravenous contrast.

[Series 5: DWI · axial · 3.0mm · 0.88mm/px · z∈[-120,+14]mm · 8 of 96 slices shown (1 of 4)]
[im 1/96]
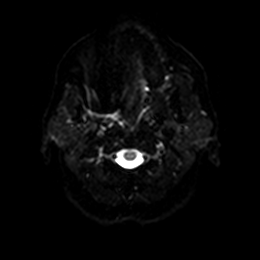
[im 14/96]
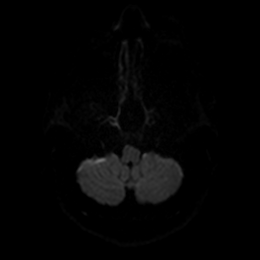
[im 28/96]
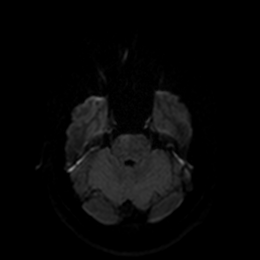
[im 41/96]
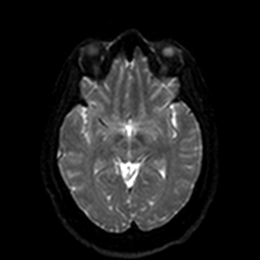
[im 55/96]
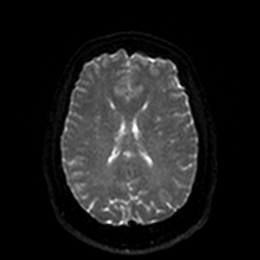
[im 68/96]
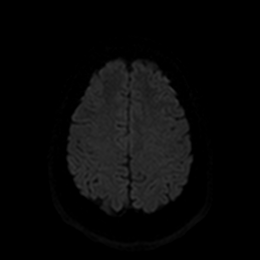
[im 82/96]
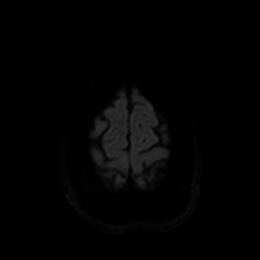
[im 96/96]
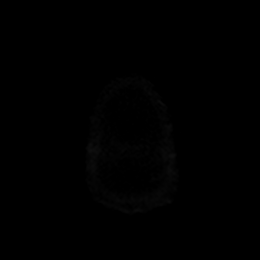

[Series 6: DWI · axial · 3.0mm · 0.88mm/px · z∈[-120,+14]mm · 4 of 48 slices shown (2 of 4)]
[im 1/48]
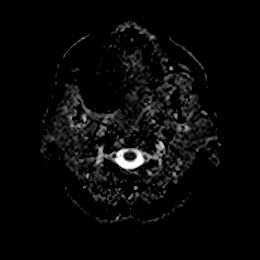
[im 16/48]
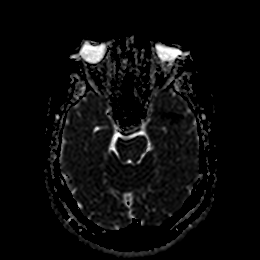
[im 32/48]
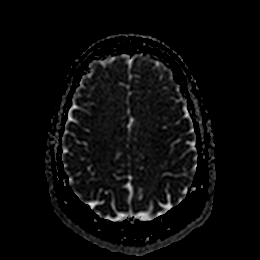
[im 48/48]
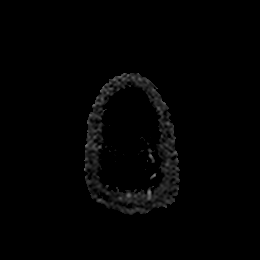

[Series 7: DWI · coronal · 4.0mm · 0.88mm/px · 5 of 66 slices shown (3 of 4)]
[im 1/66]
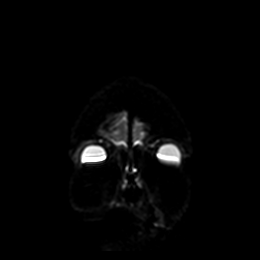
[im 17/66]
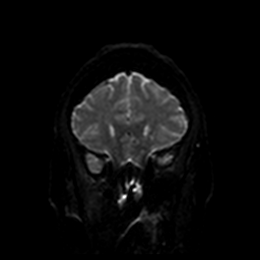
[im 33/66]
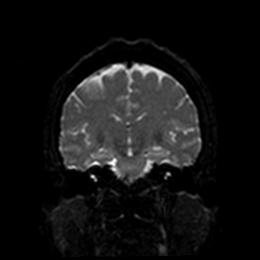
[im 49/66]
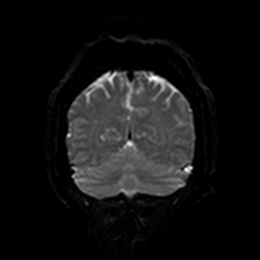
[im 66/66]
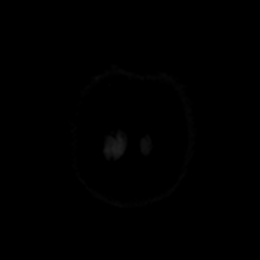

[Series 8: DWI · coronal · 4.0mm · 0.88mm/px · 3 of 33 slices shown (4 of 4)]
[im 1/33]
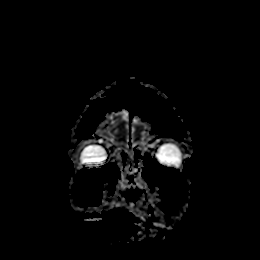
[im 17/33]
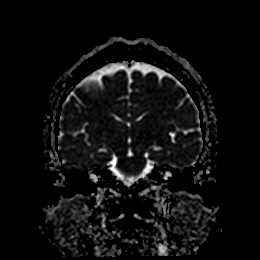
[im 33/33]
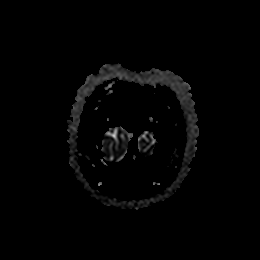

[Series 9: T1 · sagittal · 5.0mm · 0.75mm/px · 2 of 23 slices shown]
[im 1/23]
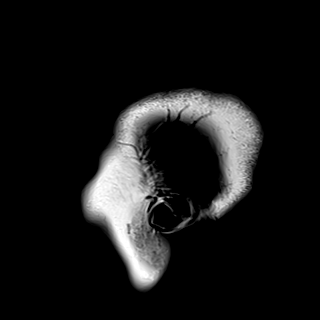
[im 23/23]
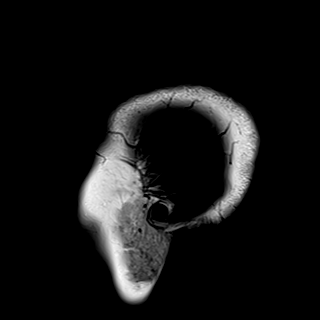

[Series 10: T2 · axial · 5.0mm · 0.72mm/px · z∈[-121,+16]mm · 2 of 25 slices shown (1 of 2)]
[im 1/25]
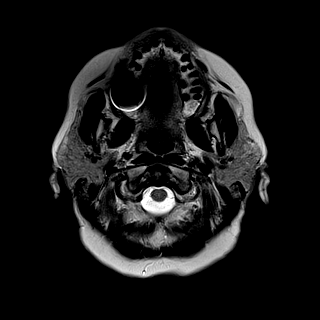
[im 25/25]
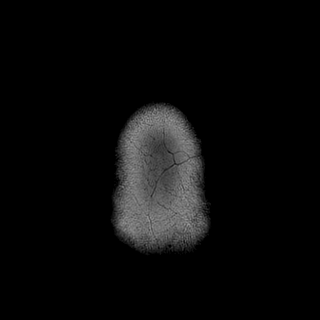

[Series 11: FLAIR · axial · 5.0mm · 0.45mm/px · z∈[-121,+15]mm · 2 of 25 slices shown]
[im 1/25]
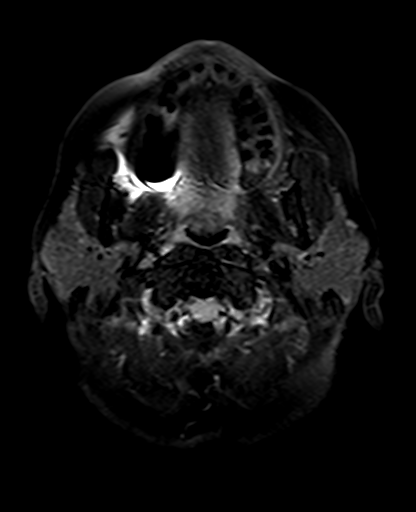
[im 25/25]
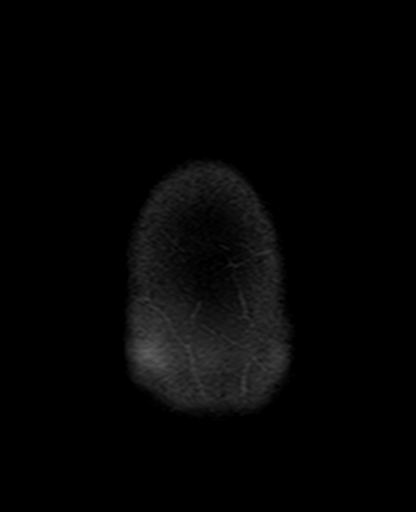

[Series 12: mag_images · axial · 3.0mm · 0.90mm/px · z∈[-126,+19]mm · 4 of 52 slices shown]
[im 1/52]
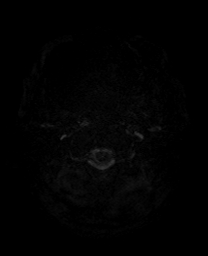
[im 18/52]
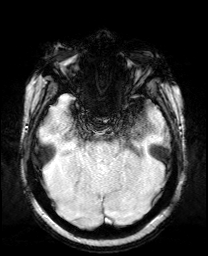
[im 35/52]
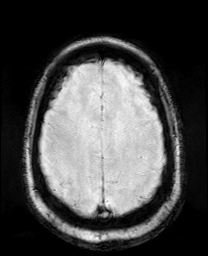
[im 52/52]
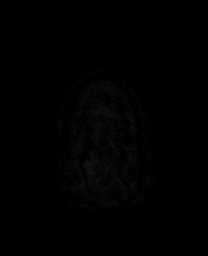

[Series 13: pha_images · axial · 3.0mm · 0.90mm/px · z∈[-126,+19]mm · 4 of 52 slices shown]
[im 1/52]
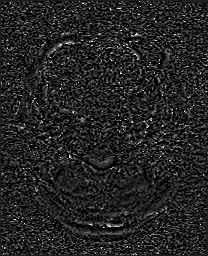
[im 18/52]
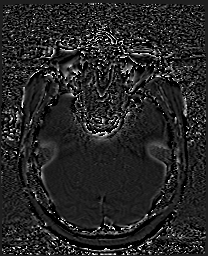
[im 35/52]
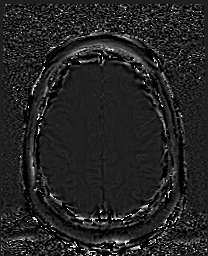
[im 52/52]
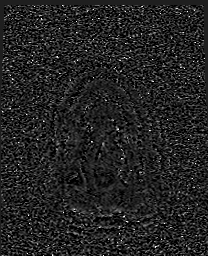

[Series 14: swi_images · axial · 3.0mm · 0.90mm/px · z∈[-126,+19]mm · 4 of 52 slices shown]
[im 1/52]
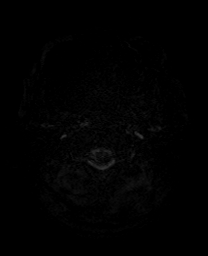
[im 18/52]
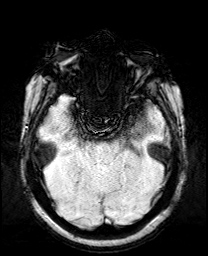
[im 35/52]
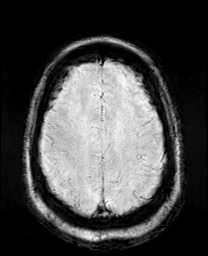
[im 52/52]
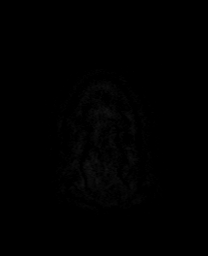

[Series 15: mip_images(sw) · axial · 24.0mm · 0.90mm/px · z∈[-116,+9]mm · 4 of 45 slices shown]
[im 1/45]
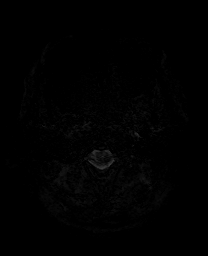
[im 15/45]
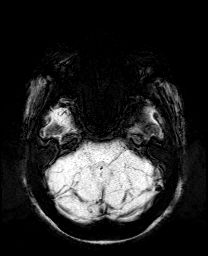
[im 30/45]
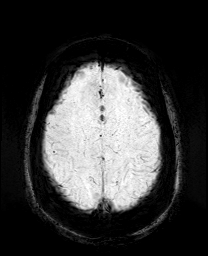
[im 45/45]
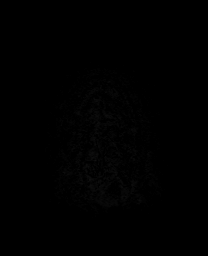

[Series 17: T2 · coronal · 5.0mm · 0.34mm/px · 2 of 29 slices shown (2 of 2)]
[im 1/29]
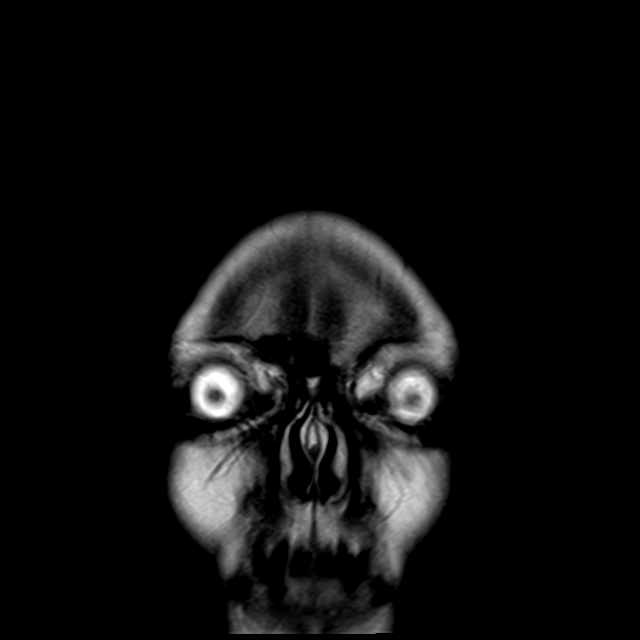
[im 29/29]
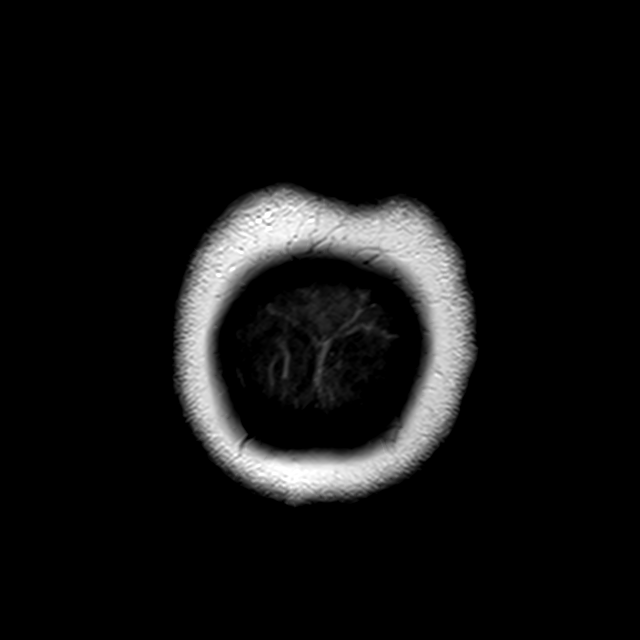

[44 of 48 positions shown; findings below may reference images not displayed]

FINDINGS: Brain: There is no acute infarction or intracranial hemorrhage.
There is no intracranial mass, mass effect, or edema. There is no
hydrocephalus or extra-axial fluid collection. Ventricles and sulci
are normal in size and configuration. Patchy small foci of T2
hyperintensity in the supratentorial white matter are nonspecific
but could reflect minor chronic microvascular ischemic changes.

Vascular: Major vessel flow voids at the skull base are preserved.

Skull and upper cervical spine: Normal marrow signal is preserved.

Sinuses/Orbits: Paranasal sinuses are aerated. Orbits are
unremarkable.

Other: Sella is unremarkable.  Mastoid air cells are clear.
IMPRESSION: No acute infarction, hemorrhage, or mass.

Probable minor chronic microvascular ischemic changes.

## 2021-12-10 SURGERY — IR WITH ANESTHESIA
Anesthesia: General

## 2021-12-10 MED ORDER — VERAPAMIL HCL ER 120 MG PO TBCR
120.0000 mg | EXTENDED_RELEASE_TABLET | Freq: Every day | ORAL | Status: DC
  Administered 2021-12-10 – 2021-12-11 (×2): 120 mg via ORAL
  Filled 2021-12-10 (×2): qty 1

## 2021-12-10 MED ORDER — ESCITALOPRAM OXALATE 10 MG PO TABS
20.0000 mg | ORAL_TABLET | Freq: Every day | ORAL | Status: DC
  Administered 2021-12-11: 20 mg via ORAL
  Filled 2021-12-10: qty 2

## 2021-12-10 MED ORDER — TOPIRAMATE 25 MG PO TABS
100.0000 mg | ORAL_TABLET | Freq: Every day | ORAL | Status: DC
  Administered 2021-12-10: 100 mg via ORAL
  Filled 2021-12-10: qty 4

## 2021-12-10 MED ORDER — SENNOSIDES-DOCUSATE SODIUM 8.6-50 MG PO TABS: 1.0000 | ORAL_TABLET | Freq: Every evening | ORAL | Status: DC | PRN

## 2021-12-10 MED ORDER — ACETAMINOPHEN 160 MG/5ML PO SOLN
650.0000 mg | ORAL | Status: DC | PRN
Start: 2021-12-10 — End: 2021-12-11

## 2021-12-10 MED ORDER — ACETAMINOPHEN 650 MG RE SUPP
650.0000 mg | RECTAL | Status: DC | PRN
Start: 2021-12-10 — End: 2021-12-11

## 2021-12-10 MED ORDER — ENOXAPARIN SODIUM 40 MG/0.4ML IJ SOSY
40.0000 mg | PREFILLED_SYRINGE | INTRAMUSCULAR | Status: DC
  Administered 2021-12-10: 40 mg via SUBCUTANEOUS
  Filled 2021-12-10: qty 0.4

## 2021-12-10 MED ORDER — SODIUM CHLORIDE 0.9% FLUSH
3.0000 mL | Freq: Once | INTRAVENOUS | Status: AC
  Administered 2021-12-10: 3 mL via INTRAVENOUS

## 2021-12-10 MED ORDER — STROKE: EARLY STAGES OF RECOVERY BOOK
Freq: Once | Status: AC
  Filled 2021-12-10: qty 1

## 2021-12-10 MED ORDER — ACETAMINOPHEN 325 MG PO TABS
650.0000 mg | ORAL_TABLET | ORAL | Status: DC | PRN
Start: 2021-12-10 — End: 2021-12-11

## 2021-12-10 MED ORDER — LEVOCETIRIZINE DIHYDROCHLORIDE 5 MG PO TABS
5.0000 mg | ORAL_TABLET | Freq: Every day | ORAL | Status: DC
Start: 2021-12-10 — End: 2021-12-10

## 2021-12-10 MED ORDER — BUPROPION HCL ER (XL) 150 MG PO TB24
150.0000 mg | ORAL_TABLET | Freq: Every day | ORAL | Status: DC
  Administered 2021-12-11: 150 mg via ORAL
  Filled 2021-12-10 (×2): qty 1

## 2021-12-10 MED ORDER — UBROGEPANT 50 MG PO TABS: 50.0000 mg | ORAL_TABLET | ORAL | Status: DC | PRN

## 2021-12-10 MED ORDER — LORATADINE 10 MG PO TABS
10.0000 mg | ORAL_TABLET | Freq: Every day | ORAL | Status: DC
  Administered 2021-12-11: 10 mg via ORAL
  Filled 2021-12-10: qty 1

## 2021-12-10 MED ORDER — BEMPEDOIC ACID-EZETIMIBE 180-10 MG PO TABS: 1.0000 | ORAL_TABLET | Freq: Every day | ORAL | Status: DC

## 2021-12-10 NOTE — Consult Note (Signed)
Neurology Consultation  Reason for Consult: CODE STROKE Referring Physician: Marda Stalker , MD  CC: Slurred speech and right sided numbness  History is obtained from: Patient  HPI: Kimberly Zhang is a 56 y.o. female with past medical history of Migraine and hyperlipidemia brought to [facility] as a Code Stroke. Patient had been in good state of health until today when she woke up @ 6:30 am and noticed having right sided numbness and slurred speech.  Patient states last night prior going to bed , she had been @ her baseline. After noticing her symptoms this am she was able to get out of bed and got ready. She then drove to her PCP office and when she was evaluated, they noticed right facial droop and slurred speech. They called EMS and she was transported by EMS to [facility] Patient was evaluated by ED Provider and Code Stroke was activated. Her NIHSS was a 2  and immediately taken to CT Suite. Patients symptomology improved where she had no facial droop and her speech was stuttering.  Her imaging included CT SCAN HEAD non contrast  revealed no acute pathology. Patient was not a TNK candidate due to being out of the window. She was also VAN Negative and her neurological exam was not consistent with large vessel occlusion Patient denies any headaches,swallowing difficulty, visual changes, dizziness, syncope or weakness   LKW: 21:30  tpa given?: no Premorbid modified Rankin scale (mRS): 0   ROS: Full ROS was performed and is negative except as noted in the HPI.     Past Medical History:  Diagnosis Date   Allergy    Anxiety    Asthma    Benign hematuria 2011   [place]   Hyperlipidemia    Migraine    Vitamin D deficiency       Family History  Problem Relation Age of Onset   Liver disease Father    Hyperlipidemia Mother    Migraines Mother    Diabetes Paternal Grandmother    Heart disease Maternal Aunt    Cancer Maternal Aunt    Cancer Maternal Uncle     Depression Other    Alzheimer's disease Other    Healthy Sister    Healthy Son    Healthy Daughter    Colon cancer Neg Hx    Esophageal cancer Neg Hx    Rectal cancer Neg Hx    Stomach cancer Neg Hx    Colon polyps Neg Hx      Social History:   reports that she has been smoking cigarettes. She has never used smokeless tobacco. She reports current alcohol use. She reports that she does not use drugs.  Medications No current facility-administered medications for this encounter.  Current Outpatient Medications:    buPROPion (WELLBUTRIN XL) 150 MG 24 hr tablet, TAKE 1 TABLET BY MOUTH EVERY MORNING FOR MOOD AND ENERGY (Patient taking differently: Take 150 mg by mouth daily.), Disp: 90 tablet, Rfl: 0   escitalopram (LEXAPRO) 20 MG tablet, TAKE 1 TABLET BY MOUTH DAILY FOR MOOD (Patient taking differently: Take 20 mg by mouth daily.), Disp: 90 tablet, Rfl: 3   levocetirizine (XYZAL) 5 MG tablet, TAKE 1 TABLET BY MOUTH DAILY FOR ALLERGIES (Patient taking differently: Take 5 mg by mouth daily.), Disp: 90 tablet, Rfl: 1   topiramate (TOPAMAX) 100 MG tablet, Take 1 tablet  at Bedtime  for Migraine Prevention & Weight Loss (Patient taking differently: Take 100 mg by mouth at bedtime.), Disp:  90 tablet, Rfl: 3   Ubrogepant (UBRELVY) 50 MG TABS, Take 50 mg by mouth as needed. (Patient taking differently: Take 50 mg by mouth as needed (migraine).), Disp: 30 tablet, Rfl: 3   verapamil (CALAN-SR) 120 MG CR tablet, TAKE 1 TABLET(120 MG) BY MOUTH AT BEDTIME, Disp: 90 tablet, Rfl: 3   Vitamin D, Ergocalciferol, (DRISDOL) 1.25 MG (50000 UNIT) CAPS capsule, TAKE 1 CAPSULE BY MOUTH 3 DAYS A WEEK (Patient taking differently: Take 50,000 Units by mouth. TAKE 1 CAPSULE BY MOUTH 3 DAYS A WEEK), Disp: 36 capsule, Rfl: 1   Bempedoic Acid-Ezetimibe (NEXLIZET) 180-10 MG TABS, Take 1 tablet by mouth daily. (Patient not taking: Reported on 12/10/2021), Disp: 90 tablet, Rfl: 3   Exam: Current vital signs: BP 130/66    Pulse (!) 52   Temp 97.9 F (36.6 C) (Oral)   Resp (!) 21   Wt 79.8 kg   SpO2 100%   BMI 31.41 kg/m  Vital signs in last 24 hours: Temp:  [96.8 F (36 C)-97.9 F (36.6 C)] 97.9 F (36.6 C) (05/19 1148) Pulse Rate:  [51-95] 52 (05/19 1315) Resp:  [15-21] 21 (05/19 1315) BP: (107-130)/(56-77) 130/66 (05/19 1315) SpO2:  [99 %-100 %] 100 % (05/19 1315) Weight:  [78.5 kg-79.8 kg] 79.8 kg (05/19 1131)  GENERAL: Awake, alert in NAD HEENT: - Normocephalic and atraumatic, dry mm, no LN++, no Thyromegally LUNGS - Clear to auscultation bilaterally with no wheezes CV - S1S2 RRR, no m/r/g, equal pulses bilaterally. ABDOMEN - Soft, nontender, nondistended with normoactive BS Ext: warm, well perfused, intact peripheral pulses, no edema  NEURO:  Mental Status: AA&Ox3, Follows commands. Pleasant, cooperative . Language: speech is stuttering /slow  Cranial Nerves: PERRL 61m/brisk. EOMI, visual fields full, no facial asymmetry, facial sensation intact, hearing intact, tongue/uvula/soft palate midline, normal sternocleidomastoid and trapezius muscle strength. No evidence of tongue atrophy  Motor: No drift in any limb. Tone is normal and bulk is normal Sensation- Intact to light touch bilaterally Coordination: FTN intact bilaterally, no ataxia in BLE. Gait- deferred  NIHSS: 0   Labs I have reviewed labs in epic and the results pertinent to this consultation are:   CBC    Component Value Date/Time   WBC 8.8 12/10/2021 1130   RBC 5.46 (H) 12/10/2021 1130   HGB 16.0 (H) 12/10/2021 1137   HCT 47.0 (H) 12/10/2021 1137   PLT 261 12/10/2021 1130   MCV 86.1 12/10/2021 1130   MCH 28.0 12/10/2021 1130   MCHC 32.6 12/10/2021 1130   RDW 12.7 12/10/2021 1130   LYMPHSABS 4.1 (H) 12/10/2021 1130   MONOABS 0.6 12/10/2021 1130   EOSABS 0.1 12/10/2021 1130   BASOSABS 0.1 12/10/2021 1130    CMP     Component Value Date/Time   NA 140 12/10/2021 1137   K 4.3 12/10/2021 1137   CL 108 12/10/2021  1137   CO2 22 12/10/2021 1130   GLUCOSE 110 (H) 12/10/2021 1137   BUN 7 12/10/2021 1137   CREATININE 0.70 12/10/2021 1137   CREATININE 0.78 06/03/2021 1055   CALCIUM 9.5 12/10/2021 1130   PROT 6.6 12/10/2021 1130   ALBUMIN 3.7 12/10/2021 1130   AST 13 (L) 12/10/2021 1130   ALT 13 12/10/2021 1130   ALKPHOS 103 12/10/2021 1130   BILITOT 0.5 12/10/2021 1130   GFRNONAA >60 12/10/2021 1130   GFRNONAA 100 09/23/2020 1124   GFRAA 116 09/23/2020 1124    Lipid Panel     Component Value Date/Time   CHOL 392 (  H) 06/03/2021 1055   TRIG 153 (H) 06/03/2021 1055   HDL 41 (L) 06/03/2021 1055   CHOLHDL 9.6 (H) 06/03/2021 1055   VLDL 23 10/19/2016 0909   LDLCALC 319 (H) 06/03/2021 1055     Imaging I have reviewed the images obtained @ Code Stroke   CT HEAD (non-con) Findings: The brain appears normal without evidence of infarct,  hemorrhage, mass lesion, mass effect, midline shift or abnormal  extra-axial fluid collection.  No hydrocephalus or pneumocephalus.  Calvarium intact.  Imaged paranasal sinuses and mastoid air cells  are clear.    Assessment: This is a 56 yo female with several vascular  stroke  risk factors who presents with sudden onset of slurred speech and right sided subjective numbness.  - No focal deficits on exam, with NIHSS of 0. - CT head is negative for acute abnormality.  - DDx includes stroke, TIA and complicated migraine.  - Of note, the patient states that her migraine headaches are well controlled with Topamax & [name].  - Most concerning component of the DDx is stroke and this will need to be ruled out. She has risk factors for stroke and is not on any antiplatelet therapy.  Recommendations: Neuro Checks q 4 hours Permissive Hypertension next 24-48 hours CTA Head and Neck with Contrast MRI Brain W/O Contrast TTE Cardiac telemetry Check Lipid Panel : LDL Goal <70el  Check HgbA1c LDL: A1c Goal <6.0% Check TSH Start ASA 81 mg po q day Permissive HTN x  24 hours Statin Continue Nexlizet 180-10 mg qday PT/OT/SLT Consultation  -- Elenora Gamma , PA-C Neurologist [facility] Pager: [phone]   I have seen and examined the patient. I have formulated the assessment and recommendations. 56 year old female presenting with stroke-like symptoms. No focal deficits on exam, with NIHSS of 0. Recommendations as above.  Electronically signed: Dr. Kerney Elbe

## 2021-12-10 NOTE — ED Triage Notes (Signed)
Pt BIB [facility] from [facility] as a code stroke. Per EMS, pt went to bed at 930 pm last night, woke up around 630 am this morning feeling R side numbness. Pt drove to [facility] herself where she was advised to come to the ED further d/t R side weakness, facial dropped and slurred speech. All symptoms have resolved upon EMS arrival. A&O X4. VSS

## 2021-12-10 NOTE — Code Documentation (Signed)
Stroke Response Nurse Documentation Code Documentation  Denim JAHNIYAH REVERE is a 56 y.o. female arriving to [facility]  via [facility] on 12/10/2021 with past medical hx of hyperlipidemia and migraine. Code stroke was activated by EMS.   Patient from PCP where she was LKW at Last Night at 2130 and now complaining of right sided subjective numbess and slurred speech. Pt reporst going to bed at her baseline and waking up with right sided arm numbness. Went to her PCP and they reported right facial droop and slurred speech. EMS called and activated a Code Stroke.   Stroke team at the bedside on patient arrival. Labs drawn and patient cleared for CT by Dr. Sherry Ruffing. Patient to CT with team. NIHSS 2, see documentation for details and code stroke times. Patient with bilateral leg weakness on exam. The following imaging was completed:  CT Head. Patient is not a candidate for IV Thrombolytic due to being outside window. Patient is not a candidate for IR due to VAN negative and exam is not consistent with LVO.   Care Plan: q2 NIHSS/VS.   Bedside handoff with ED RN Chloe.    Kathrin Greathouse  Stroke Response RN

## 2021-12-10 NOTE — ED Provider Notes (Signed)
[facility] EMERGENCY DEPARTMENT Provider Note   CSN: [id number] Arrival date & time: 12/10/21  1125     History  No chief complaint on file.   Kimberly Zhang is a 56 y.o. female.  The history is provided by the patient, the EMS personnel, medical records and a relative. No language interpreter was used.  Neurologic Problem This is a new problem. The current episode started 12 to 24 hours ago. The problem occurs constantly. The problem has been resolved. Pertinent negatives include no chest pain, no abdominal pain, no headaches and no shortness of breath. Nothing aggravates the symptoms. Nothing relieves the symptoms. She has tried nothing for the symptoms. The treatment provided no relief.      Home Medications Prior to Admission medications   Medication Sig Start Date End Date Taking? Authorizing Provider  B Complex Vitamins (VITAMIN-B COMPLEX) TABS Take 1 tablet by mouth.    [provider]  Bempedoic Acid-Ezetimibe (NEXLIZET) 180-10 MG TABS Take 1 tablet by mouth daily. 09/23/20   Garnet Sierras, NP  buPROPion (WELLBUTRIN XL) 150 MG 24 hr tablet TAKE 1 TABLET BY MOUTH EVERY MORNING FOR MOOD AND ENERGY 10/14/21   Magda Bernheim, NP  escitalopram (LEXAPRO) 20 MG tablet TAKE 1 TABLET BY MOUTH DAILY FOR MOOD 11/11/21   Liane Comber, NP  levocetirizine (XYZAL) 5 MG tablet TAKE 1 TABLET BY MOUTH DAILY FOR ALLERGIES 10/20/21   Magda Bernheim, NP  topiramate (TOPAMAX) 100 MG tablet Take 1 tablet  at Bedtime  for Migraine Prevention & Weight Loss 09/23/20   McClanahan, Danton Sewer, NP  Ubrogepant (UBRELVY) 50 MG TABS Take 50 mg by mouth as needed. 09/23/20   Garnet Sierras, NP  verapamil (CALAN-SR) 120 MG CR tablet TAKE 1 TABLET(120 MG) BY MOUTH AT BEDTIME 09/23/20   McClanahan, Danton Sewer, NP  Vitamin D, Ergocalciferol, (DRISDOL) 1.25 MG (50000 UNIT) CAPS capsule TAKE 1 CAPSULE BY MOUTH 3 DAYS A WEEK 12/24/19   Garnet Sierras, NP      Allergies    Shellfish allergy,  Shellfish-derived products, Atorvastatin, and Rosuvastatin    Review of Systems   Review of Systems  Constitutional:  Negative for chills, fatigue and fever.  HENT:  Negative for congestion.   Eyes:  Negative for visual disturbance.  Respiratory:  Negative for cough, chest tightness, shortness of breath and wheezing.   Cardiovascular:  Negative for chest pain, palpitations and leg swelling.  Gastrointestinal:  Negative for abdominal pain, constipation, diarrhea, nausea and vomiting.  Genitourinary:  Negative for dysuria, flank pain and frequency.  Musculoskeletal:  Negative for back pain, neck pain and neck stiffness.  Skin:  Negative for rash and wound.  Neurological:  Positive for facial asymmetry (per ems), speech difficulty and numbness. Negative for dizziness, weakness, light-headedness and headaches.  Psychiatric/Behavioral:  Negative for agitation.    Physical Exam Updated Vital Signs BP 122/72   Pulse (!) 56   Temp 97.9 F (36.6 C) (Oral)   Resp 15   Wt 79.8 kg   SpO2 100%   BMI 31.41 kg/m  Physical Exam Vitals and nursing note reviewed.  Constitutional:      General: She is not in acute distress.    Appearance: She is well-developed. She is not ill-appearing, toxic-appearing or diaphoretic.  HENT:     Head: Normocephalic and atraumatic.     Nose: No congestion or rhinorrhea.     Mouth/Throat:     Mouth: Mucous membranes are moist.     Pharynx:  No oropharyngeal exudate or posterior oropharyngeal erythema.  Eyes:     Extraocular Movements: Extraocular movements intact.     Conjunctiva/sclera: Conjunctivae normal.     Pupils: Pupils are equal, round, and reactive to light.  Neck:     Vascular: No carotid bruit.  Cardiovascular:     Rate and Rhythm: Normal rate and regular rhythm.     Heart sounds: No murmur heard. Pulmonary:     Effort: Pulmonary effort is normal. No respiratory distress.     Breath sounds: Normal breath sounds. No wheezing, rhonchi or rales.   Chest:     Chest wall: No tenderness.  Abdominal:     General: Abdomen is flat.     Palpations: Abdomen is soft.     Tenderness: There is no abdominal tenderness. There is no right CVA tenderness, left CVA tenderness, guarding or rebound.  Musculoskeletal:        General: No swelling or tenderness.     Cervical back: Neck supple. No tenderness.  Skin:    General: Skin is warm and dry.     Capillary Refill: Capillary refill takes less than 2 seconds.     Findings: No erythema.  Neurological:     General: No focal deficit present.     Mental Status: She is alert.     Cranial Nerves: No cranial nerve deficit.     Sensory: No sensory deficit.     Motor: No weakness.     Coordination: Coordination normal.  Psychiatric:        Mood and Affect: Mood normal.    ED Results / Procedures / Treatments   Labs (all labs ordered are listed, but only abnormal results are displayed) Labs Reviewed  CBC - Abnormal; Notable for the following components:      Result Value   RBC 5.46 (*)    Hemoglobin 15.3 (*)    HCT 47.0 (*)    All other components within normal limits  DIFFERENTIAL - Abnormal; Notable for the following components:   Lymphs Abs 4.1 (*)    All other components within normal limits  COMPREHENSIVE METABOLIC PANEL - Abnormal; Notable for the following components:   Glucose, Bld 109 (*)    AST 13 (*)    All other components within normal limits  I-STAT CHEM 8, ED - Abnormal; Notable for the following components:   Glucose, Bld 110 (*)    Hemoglobin 16.0 (*)    HCT 47.0 (*)    All other components within normal limits  CBG MONITORING, ED - Abnormal; Notable for the following components:   Glucose-Capillary 119 (*)    All other components within normal limits  PROTIME-INR  APTT    EKG EKG Interpretation  Date/Time:  Friday Dec 10 2021 11:48:17 EDT Ventricular Rate:  54 PR Interval:  185 QRS Duration: 80 QT Interval:  404 QTC Calculation: 383 R Axis:   52 Text  Interpretation: Sinus rhythm no prior ECG for comparison. No STEMI Confirmed by Antony Blackbird [phone]) on 12/10/2021 12:19:14 PM  Radiology CT HEAD CODE STROKE WO CONTRAST  Result Date: 12/10/2021 CLINICAL DATA:  Code stroke. EXAM: CT HEAD WITHOUT CONTRAST TECHNIQUE: Contiguous axial images were obtained from the base of the skull through the vertex without intravenous contrast. RADIATION DOSE REDUCTION: This exam was performed according to the departmental dose-optimization program which includes automated exposure control, adjustment of the mA and/or kV according to patient size and/or use of iterative reconstruction technique. COMPARISON:  CT head 04/11/2012 FINDINGS:  Brain: There is no acute intracranial hemorrhage, extra-axial fluid collection, or acute infarct. Parenchymal volume is normal. The ventricles are normal in size. Gray-white differentiation is preserved. There is no mass lesion.  There is no mass effect or midline shift. Vascular: No dense vessel is seen. Skull: Normal. Negative for fracture or focal lesion. Sinuses/Orbits: The paranasal sinuses are clear. The globes and orbits are unremarkable. Other: None. ASPECTS [facility] Stroke Program Early CT Score) - Ganglionic level infarction (caudate, lentiform nuclei, internal capsule, insula, M1-M3 cortex): 7 - Supraganglionic infarction (M4-M6 cortex): 3 Total score (0-10 with 10 being normal): 10 IMPRESSION: 1. No acute intracranial pathology. 2. ASPECTS is 10 These results were paged via AMION at the time of interpretation on 12/10/2021 at 11:42 am to provider Dr Cheral Marker. Electronically Signed   By: Valetta Mole M.D.   On: 12/10/2021 11:45    Procedures Procedures    Medications Ordered in ED Medications  sodium chloride flush (NS) 0.9 % injection 3 mL (3 mLs Intravenous Given 12/10/21 1150)    ED Course/ Medical Decision Making/ A&P                           Medical Decision Making Amount and/or Complexity of Data Reviewed Labs:  ordered. Radiology: ordered.  Risk Decision regarding hospitalization.    Kimberly Zhang is a 56 y.o. female with a past medical history significant for hypertension, hyperlipidemia, asthma, and anxiety who presents as a code stroke.  According to patient, her last normal was at about 930 last night going to bed.  Patient woke up this morning and was having a numbness sensation in her right face, right arm, and right leg and when she went to urgent care, they reportedly were concerned she had some difficulty with speech and a right facial droop.  Patient activated as a code stroke for possible LVO.  Patient denies any headache or neck pain denies any recent trauma.  She denies any vision changes, nausea, vomiting, constipation, diarrhea, or urinary changes.  I personally evaluated patient at the bridge upon entry to the emergency department and her airway appeared clear.  She could speak clearly answer questions for me.  Patient reports that her symptoms have continued to improve since earlier.  Patient taken to CT scanner where she was seen by neurology and started her work-up.  CT head reportedly reassuring.  Neurology recommends MRI brain without to rule out acute stroke.  Is unclear the etiology however patient symptoms have nearly resolved.  She no longer has difficulty with speech and she is denying any persistent numbness in her right face, right arm, or right leg.  On my exam, she had intact sensation and strength throughout.  I did not appreciate a carotid bruit and patient no other focal deficits on my exam.  Patient resting comfortably.  Patient being worked up for possible life-threatening abnormality to rule out stroke.  Anticipate reassessment after MRI to determine disposition.  2:56 PM MRI returned showing some microvascular changes but otherwise no acute stroke, bleed, or mass.  I reach back out to neurology and they would like her admitted for likely TIA work-up.  We  will call medicine for admission.          Final Clinical Impression(s) / ED Diagnoses Final diagnoses:  TIA (transient ischemic attack)  Transient speech disturbance     Clinical Impression: 1. TIA (transient ischemic attack)   2. Transient speech disturbance  Disposition: Admit  This note was prepared with assistance of Systems analyst. Occasional wrong-word or sound-a-like substitutions may have occurred due to the inherent limitations of voice recognition software.     Waymond Meador, Gwenyth Allegra, MD 12/10/21 1459

## 2021-12-10 NOTE — ED Notes (Signed)
Patient transported to MRI 

## 2021-12-10 NOTE — Progress Notes (Signed)
Assessment and Plan:  Kimberly Zhang was seen today for an episodic visit.  Diagnoses and all order for this visit:  1. Transient ischemic attack (TIA) EMS contacted for transport to ER given presentation of symptoms.  2. Slowness and poor responsiveness   3. Numbness and tingling   4. Fatigue, unspecified type  Notify office for further evaluation and treatment, questions or concerns if s/s fail to improve. The risks and benefits of my recommendations, as well as other treatment options were discussed with the patient today. Questions were answered.  Further disposition pending results of labs. Discussed med's effects and SE's.    Over 20 minutes of exam, counseling, chart review, and critical decision making was performed.   Future Appointments  Date Time Provider [place]  12/14/2021  9:30 AM Magda Bernheim, NP [facility] None  06/03/2022 10:00 AM Magda Bernheim, NP [facility] None    ------------------------------------------------------------------------------------------------------------------   HPI Pulse 62   Temp (!) 96.8 F (36 C)   Wt 173 lb (78.5 kg)   SpO2 99%   BMI 30.89 kg/m   56 y.o.female presents for a nurse visit today, however, was concerned for  numbness or tingling of right side from top of head to bottom of foot. Onset of symptoms was sudden, starting about 5 hours ago. Symptoms are currently of mild severity. Symptoms have lasted 5 hours. Symptoms occurred upon awakening and continue to last. Stroke risk factors include hyperlipidemia. Prior stroke history: none. Patient also complains of fatigue and feeling slow to speak with mild slurring. Patient denies chest pain, palpitations, seizures, and shortness of breath.   Past Medical History:  Diagnosis Date   Allergy    Anxiety    Asthma    Benign hematuria 2011   Ottelin   Hyperlipidemia    Migraine    Vitamin D deficiency      Allergies  Allergen Reactions   Shellfish Allergy  Anaphylaxis   Shellfish-Derived Products Anaphylaxis   Atorvastatin     Severe nausea   Rosuvastatin     Nausea, GI    Current Outpatient Medications on File Prior to Visit  Medication Sig   Bempedoic Acid-Ezetimibe (NEXLIZET) 180-10 MG TABS Take 1 tablet by mouth daily. (Patient not taking: Reported on 12/10/2021)   buPROPion (WELLBUTRIN XL) 150 MG 24 hr tablet TAKE 1 TABLET BY MOUTH EVERY MORNING FOR MOOD AND ENERGY (Patient taking differently: Take 150 mg by mouth daily.)   escitalopram (LEXAPRO) 20 MG tablet TAKE 1 TABLET BY MOUTH DAILY FOR MOOD (Patient taking differently: Take 20 mg by mouth daily.)   levocetirizine (XYZAL) 5 MG tablet TAKE 1 TABLET BY MOUTH DAILY FOR ALLERGIES (Patient taking differently: Take 5 mg by mouth daily.)   topiramate (TOPAMAX) 100 MG tablet Take 1 tablet  at Bedtime  for Migraine Prevention & Weight Loss (Patient taking differently: Take 100 mg by mouth at bedtime.)   Ubrogepant (UBRELVY) 50 MG TABS Take 50 mg by mouth as needed. (Patient taking differently: Take 50 mg by mouth as needed (migraine).)   verapamil (CALAN-SR) 120 MG CR tablet TAKE 1 TABLET(120 MG) BY MOUTH AT BEDTIME   Vitamin D, Ergocalciferol, (DRISDOL) 1.25 MG (50000 UNIT) CAPS capsule TAKE 1 CAPSULE BY MOUTH 3 DAYS A WEEK (Patient taking differently: Take 50,000 Units by mouth. TAKE 1 CAPSULE BY MOUTH 3 DAYS A WEEK)   No current facility-administered medications on file prior to visit.    ROS: all negative except what is noted in the  HPI.   Physical Exam:  Pulse 62   Temp (!) 96.8 F (36 C)   Wt 173 lb (78.5 kg)   SpO2 99%   BMI 30.89 kg/m   General Appearance: NAD.  Awake, conversant and cooperative. Eyes: PERRLA, EOMs intact.  Sclera white.  Conjunctiva without erythema. Sinuses: No frontal/maxillary tenderness.  No nasal discharge. Nares patent.  ENT/Mouth: Ext aud canals clear.  Bilateral TMs w/DOL and without erythema or bulging. Hearing intact.  Posterior pharynx without  swelling or exudate.  Tonsils without swelling or erythema.  Neck: Supple.  No masses, nodules or thyromegaly. Respiratory: Effort is regular with non-labored breathing. Breath sounds are equal bilaterally without rales, rhonchi, wheezing or stridor.  Cardio: RRR with no MRGs. Brisk peripheral pulses without edema.  Abdomen: Active BS in all four quadrants.  Soft and non-tender without guarding, rebound tenderness, hernias or masses. Lymphatics: Non tender without lymphadenopathy.  Musculoskeletal: Full ROM, 5/5 strength, normal ambulation.  No clubbing or cyanosis. Skin: Appropriate color for ethnicity. Warm without rashes, lesions, ecchymosis, ulcers.  Neuro: Mildly slurred speech, slow to respond.  CN II-XII grossly normal. Normal muscle tone without cerebellar symptoms and intact sensation.   Psych: AO X 3,  appropriate mood and affect, insight and judgment.     Darrol Jump, NP 2:08 PM [facility]

## 2021-12-10 NOTE — H&P (Signed)
History and Physical    Kimberly Zhang FAO:[id number] DOB: 12/13/1965 DOA: 12/10/2021  PCP: Unk Pinto, MD   Chief Complaint: Weakness/facial droop  HPI: Kimberly Zhang is a 56 y.o. female with medical history significant of hyperlipidemia, migraines (unspecified subtype), anxiety and depression who presents to the ED from PCP office with acute onset right-sided numbness and reported slurred speech.  Last known normal was Thursday night, she is outside the window for tPA consideration.  Unclear time of onset but symptoms were noted at 6:30 AM, neurology has been consulted recommending inpatient evaluation for TIA given negative CT and MRI at this point.  Hospitalist called for admission.  Review of Systems: As per HPI as above with right-sided numbness and slurred speech without headache fever chills chest pain nausea vomiting diarrhea constipation sick contacts or recent travel..   Assessment/Plan Principal Problem:   Transient ischemic attack (TIA) Active Problems:   Anxiety   Migraine   Obesity (BMI 30.0-34.9)   TIA  - Symptoms resolved prior to admission  - Neurology following, appreciate insight and recommendations recommending further TIA work-up, will defer to their expertise for further imaging and testing other than CT MRI and echocardiogram listed below  - MRI and CT head reassuringly unremarkable, EKG without overt dysrhythmias.  - Echo pending  Anxiety/depression  - Continue home Lexapro, Wellbutrin  Migraine  - Continue home Topamax Ubrelvy, verapamil  Obesity  - Increased risk, comorbid conditions as above, will recommend improved dietary lifestyle changes  DVT prophylaxis: Lovenox Code Status: Full Family Communication: None present Status is: Observation  Dispo: The patient is from: Home              Anticipated d/c is to: Home              Anticipated d/c date is: 24 to 48 hours              Patient currently not medically stable for discharge  requiring ongoing evaluation by neurology for TIA work-up  Consultants:  Neurology  Procedures:  None   Past Medical History:  Diagnosis Date   Allergy    Anxiety    Asthma    Benign hematuria 2011   Ottelin   Hyperlipidemia    Migraine    Vitamin D deficiency     Past Surgical History:  Procedure Laterality Date   ABDOMINAL HYSTERECTOMY  1998   COLONOSCOPY  2018   LEEP     POLYPECTOMY     TONSILECTOMY/ADENOIDECTOMY WITH MYRINGOTOMY       reports that she has been smoking cigarettes. She has never used smokeless tobacco. She reports current alcohol use. She reports that she does not use drugs.  Allergies  Allergen Reactions   Shellfish Allergy Anaphylaxis   Shellfish-Derived Products Anaphylaxis   Atorvastatin     Severe nausea   Rosuvastatin     Nausea, GI    Family History  Problem Relation Age of Onset   Liver disease Father    Hyperlipidemia Mother    Migraines Mother    Diabetes Paternal Grandmother    Heart disease Maternal Aunt    Cancer Maternal Aunt    Cancer Maternal Uncle    Depression Other    Alzheimer's disease Other    Healthy Sister    Healthy Son    Healthy Daughter    Colon cancer Neg Hx    Esophageal cancer Neg Hx    Rectal cancer Neg Hx    Stomach cancer Neg  Hx    Colon polyps Neg Hx     Prior to Admission medications   Medication Sig Start Date End Date Taking? Authorizing Provider  buPROPion (WELLBUTRIN XL) 150 MG 24 hr tablet TAKE 1 TABLET BY MOUTH EVERY MORNING FOR MOOD AND ENERGY Patient taking differently: Take 150 mg by mouth daily. 10/14/21  Yes Magda Bernheim, NP  escitalopram (LEXAPRO) 20 MG tablet TAKE 1 TABLET BY MOUTH DAILY FOR MOOD Patient taking differently: Take 20 mg by mouth daily. 11/11/21  Yes Liane Comber, NP  levocetirizine (XYZAL) 5 MG tablet TAKE 1 TABLET BY MOUTH DAILY FOR ALLERGIES Patient taking differently: Take 5 mg by mouth daily. 10/20/21  Yes Magda Bernheim, NP  topiramate (TOPAMAX) 100 MG tablet  Take 1 tablet  at Bedtime  for Migraine Prevention & Weight Loss Patient taking differently: Take 100 mg by mouth at bedtime. 09/23/20  Yes McClanahan, Kyra, NP  Ubrogepant (UBRELVY) 50 MG TABS Take 50 mg by mouth as needed. Patient taking differently: Take 50 mg by mouth as needed (migraine). 09/23/20  Yes McClanahan, Danton Sewer, NP  verapamil (CALAN-SR) 120 MG CR tablet TAKE 1 TABLET(120 MG) BY MOUTH AT BEDTIME 09/23/20  Yes McClanahan, Kyra, NP  Vitamin D, Ergocalciferol, (DRISDOL) 1.25 MG (50000 UNIT) CAPS capsule TAKE 1 CAPSULE BY MOUTH 3 DAYS A WEEK Patient taking differently: Take 50,000 Units by mouth. TAKE 1 CAPSULE BY MOUTH 3 DAYS A WEEK 12/24/19  Yes McClanahan, Kyra, NP  Bempedoic Acid-Ezetimibe (NEXLIZET) 180-10 MG TABS Take 1 tablet by mouth daily. Patient not taking: Reported on 12/10/2021 09/23/20   Garnet Sierras, NP    Physical Exam: Vitals:   12/10/21 1630 12/10/21 1700 12/10/21 1730 12/10/21 1753  BP: (!) 124/92 116/68 128/68   Pulse: (!) 55 (!) 54 60 61  Resp: '14 13 13 19  '$ Temp:      TempSrc:      SpO2: 100% 100% 100% 98%  Weight:        Constitutional: NAD, calm, comfortable Vitals:   12/10/21 1630 12/10/21 1700 12/10/21 1730 12/10/21 1753  BP: (!) 124/92 116/68 128/68   Pulse: (!) 55 (!) 54 60 61  Resp: '14 13 13 19  '$ Temp:      TempSrc:      SpO2: 100% 100% 100% 98%  Weight:       General:  Pleasantly resting in bed, No acute distress. HEENT:  Normocephalic atraumatic.  Sclerae nonicteric, noninjected.  Extraocular movements intact bilaterally. Neck:  Without mass or deformity.  Trachea is midline. Lungs:  Clear to auscultate bilaterally without rhonchi, wheeze, or rales. Heart:  Regular rate and rhythm.  Without murmurs, rubs, or gallops. Abdomen:  Soft, nontender, nondistended.  Without guarding or rebound. Extremities: Without cyanosis, clubbing, edema, or obvious deformity. Vascular:  Dorsalis pedis and posterior tibial pulses palpable bilaterally. Skin:  Warm and  dry, no erythema, no ulcerations.  Labs on Admission: I have personally reviewed following labs and imaging studies  CBC: Recent Labs  Lab 12/10/21 1130 12/10/21 1137  WBC 8.8  --   NEUTROABS 4.0  --   HGB 15.3* 16.0*  HCT 47.0* 47.0*  MCV 86.1  --   PLT 261  --    Basic Metabolic Panel: Recent Labs  Lab 12/10/21 1130 12/10/21 1137  NA 139 140  K 4.3 4.3  CL 110 108  CO2 22  --   GLUCOSE 109* 110*  BUN 7 7  CREATININE 0.80 0.70  CALCIUM 9.5  --  GFR: CrCl cannot be calculated (Unknown ideal weight.). Liver Function Tests: Recent Labs  Lab 12/10/21 1130  AST 13*  ALT 13  ALKPHOS 103  BILITOT 0.5  PROT 6.6  ALBUMIN 3.7   No results for input(s): LIPASE, AMYLASE in the last 168 hours. No results for input(s): AMMONIA in the last 168 hours. Coagulation Profile: No results for input(s): INR, PROTIME in the last 168 hours. Cardiac Enzymes: No results for input(s): CKTOTAL, CKMB, CKMBINDEX, TROPONINI in the last 168 hours. BNP (last 3 results) No results for input(s): PROBNP in the last 8760 hours. HbA1C: No results for input(s): HGBA1C in the last 72 hours. CBG: Recent Labs  Lab 12/10/21 1128  GLUCAP 119*   Lipid Profile: No results for input(s): CHOL, HDL, LDLCALC, TRIG, CHOLHDL, LDLDIRECT in the last 72 hours. Thyroid Function Tests: No results for input(s): TSH, T4TOTAL, FREET4, T3FREE, THYROIDAB in the last 72 hours. Anemia Panel: No results for input(s): VITAMINB12, FOLATE, FERRITIN, TIBC, IRON, RETICCTPCT in the last 72 hours. Urine analysis:    Component Value Date/Time   COLORURINE YELLOW 06/03/2021 [place] 06/03/2021 1055   LABSPEC 1.019 06/03/2021 1055   PHURINE 5.5 06/03/2021 1055   GLUCOSEU NEGATIVE 06/03/2021 1055   HGBUR 1+ (A) 06/03/2021 1055   BILIRUBINUR NEGATIVE 03/21/2016 1013   KETONESUR NEGATIVE 06/03/2021 1055   PROTEINUR NEGATIVE 06/03/2021 1055   UROBILINOGEN 0.2 10/06/2014 0851   NITRITE NEGATIVE  06/03/2021 1055   LEUKOCYTESUR NEGATIVE 06/03/2021 1055    Radiological Exams on Admission: MR BRAIN WO CONTRAST  Result Date: 12/10/2021 CLINICAL DATA:  Neuro deficit, acute, stroke suspected; right-sided weakness, facial droop and slurred speech EXAM: MRI HEAD WITHOUT CONTRAST TECHNIQUE: Multiplanar, multiecho pulse sequences of the brain and surrounding structures were obtained without intravenous contrast. COMPARISON:  None Available. FINDINGS: Brain: There is no acute infarction or intracranial hemorrhage. There is no intracranial mass, mass effect, or edema. There is no hydrocephalus or extra-axial fluid collection. Ventricles and sulci are normal in size and configuration. Patchy small foci of T2 hyperintensity in the supratentorial white matter are nonspecific but could reflect minor chronic microvascular ischemic changes. Vascular: Major vessel flow voids at the skull base are preserved. Skull and upper cervical spine: Normal marrow signal is preserved. Sinuses/Orbits: Paranasal sinuses are aerated. Orbits are unremarkable. Other: Sella is unremarkable.  Mastoid air cells are clear. IMPRESSION: No acute infarction, hemorrhage, or mass. Probable minor chronic microvascular ischemic changes. Electronically Signed   By: Macy Mis M.D.   On: 12/10/2021 13:59   CT HEAD CODE STROKE WO CONTRAST  Result Date: 12/10/2021 CLINICAL DATA:  Code stroke. EXAM: CT HEAD WITHOUT CONTRAST TECHNIQUE: Contiguous axial images were obtained from the base of the skull through the vertex without intravenous contrast. RADIATION DOSE REDUCTION: This exam was performed according to the departmental dose-optimization program which includes automated exposure control, adjustment of the mA and/or kV according to patient size and/or use of iterative reconstruction technique. COMPARISON:  CT head 04/11/2012 FINDINGS: Brain: There is no acute intracranial hemorrhage, extra-axial fluid collection, or acute infarct.  Parenchymal volume is normal. The ventricles are normal in size. Gray-white differentiation is preserved. There is no mass lesion.  There is no mass effect or midline shift. Vascular: No dense vessel is seen. Skull: Normal. Negative for fracture or focal lesion. Sinuses/Orbits: The paranasal sinuses are clear. The globes and orbits are unremarkable. Other: None. ASPECTS [facility] Stroke Program Early CT Score) - Ganglionic level infarction (caudate, lentiform nuclei, internal capsule,  insula, M1-M3 cortex): 7 - Supraganglionic infarction (M4-M6 cortex): 3 Total score (0-10 with 10 being normal): 10 IMPRESSION: 1. No acute intracranial pathology. 2. ASPECTS is 10 These results were paged via AMION at the time of interpretation on 12/10/2021 at 11:42 am to provider Dr Cheral Marker. Electronically Signed   By: Valetta Mole M.D.   On: 12/10/2021 11:45    EKG: Independently reviewed.    Little Ishikawa DO [facility] For contact please use secure messenger on Epic  If 7PM-7AM, please contact night-coverage located on [email or web address]   12/10/2021, 6:04 PM

## 2021-12-11 ENCOUNTER — Observation Stay (HOSPITAL_BASED_OUTPATIENT_CLINIC_OR_DEPARTMENT_OTHER): Payer: BC Managed Care – PPO

## 2021-12-11 ENCOUNTER — Observation Stay (HOSPITAL_COMMUNITY): Payer: BC Managed Care – PPO

## 2021-12-11 DIAGNOSIS — R479 Unspecified speech disturbances: Secondary | ICD-10-CM | POA: Diagnosis not present

## 2021-12-11 DIAGNOSIS — G459 Transient cerebral ischemic attack, unspecified: Secondary | ICD-10-CM

## 2021-12-11 LAB — ECHOCARDIOGRAM COMPLETE
Area-P 1/2: 3.17 cm2
S' Lateral: 3 cm
Weight: 2814.83 oz

## 2021-12-11 IMAGING — CT CT ANGIO HEAD-NECK (W OR W/O PERF)
2 of 7 series · 8 of 33 positions shown · IV contrast (OMNI 350)
Comparison: Brain MRI and head CT [DATE].

CLINICAL DATA: 55-year-old female TIA.

EXAM:
CT ANGIOGRAPHY HEAD AND NECK
TECHNIQUE: Multidetector CT imaging of the head and neck was performed using
the standard protocol during bolus administration of intravenous
contrast. Multiplanar CT image reconstructions and MIPs were
obtained to evaluate the vascular anatomy. Carotid stenosis
measurements (when applicable) are obtained utilizing NASCET
criteria, using the distal internal carotid diameter as the
denominator.

[Series 5: cta neck · axial · 0.54mm/px · z∈[-192,-72]mm · 2 of 181 slices shown]
[im 61/181  soft-tissue]
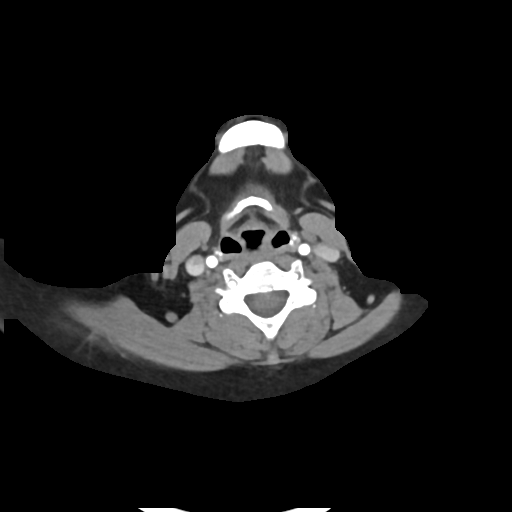
[im 121/181  soft-tissue]
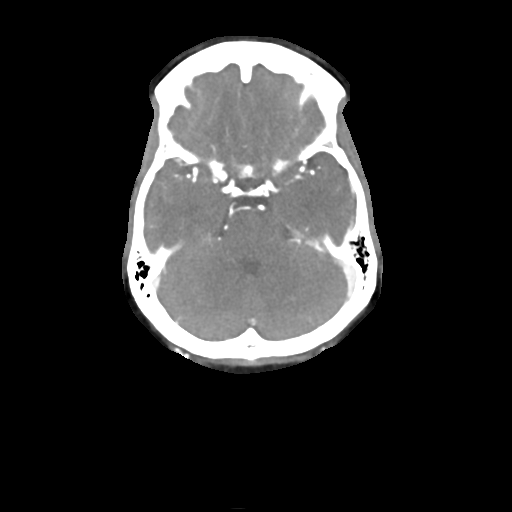

[Series 7: cta neck axial · axial · 0.39mm/px · z∈[-270,-15]mm · 6 of 360 slices shown]
[im 52/360  soft-tissue]
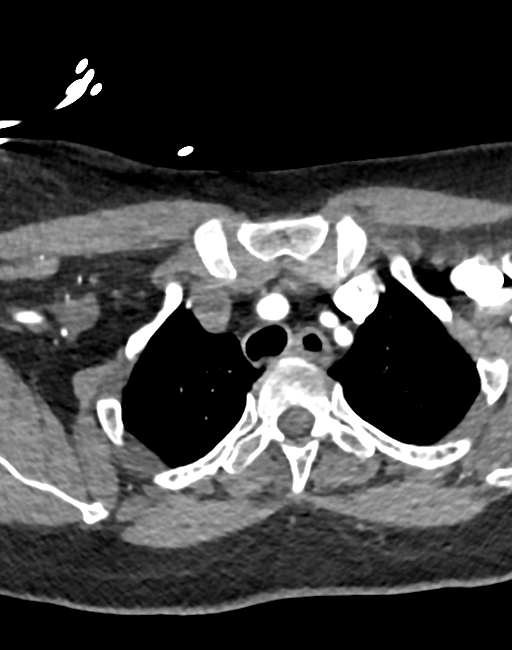
[im 103/360  bone]
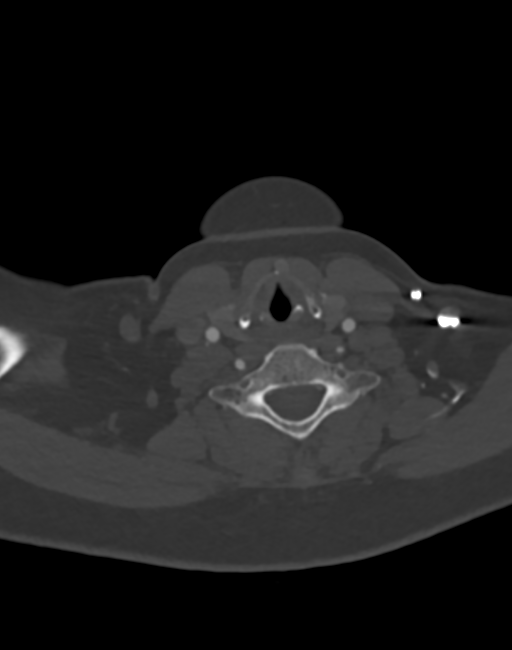
[im 154/360  soft-tissue]
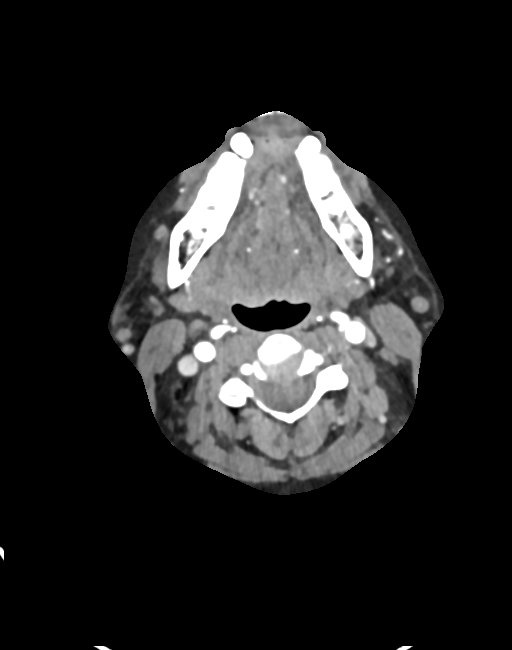
[im 206/360  bone]
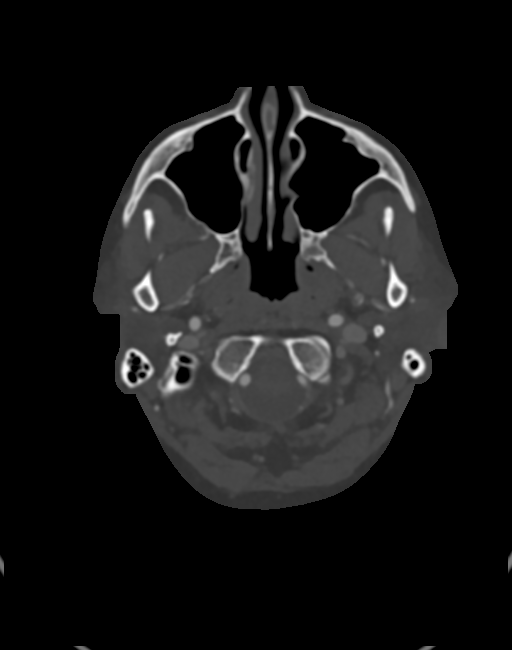
[im 257/360  soft-tissue]
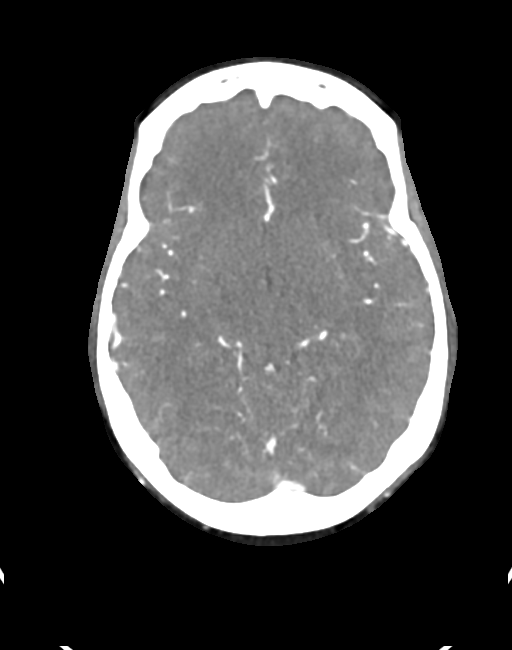
[im 308/360  bone]
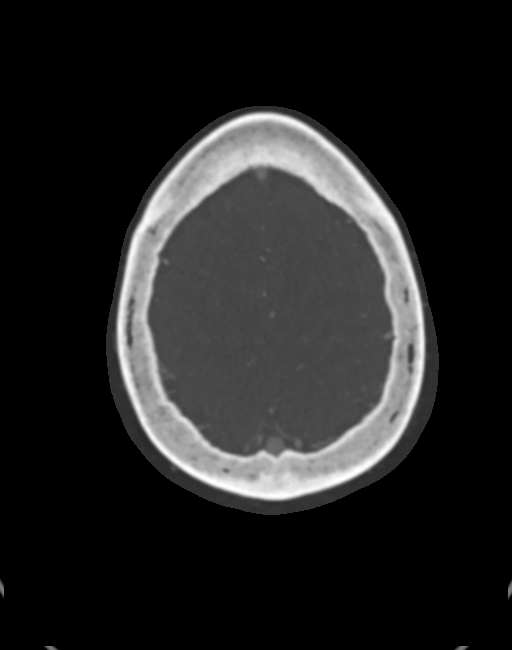

[8 of 33 positions shown; findings below may reference images not displayed]

RADIATION DOSE REDUCTION: This exam was performed according to the
departmental dose-optimization program which includes automated
exposure control, adjustment of the mA and/or kV according to
patient size and/or use of iterative reconstruction technique.

CONTRAST:  100mL OMNIPAQUE IOHEXOL 350 MG/ML SOLN
FINDINGS: CTA NECK

Skeleton: No acute osseous abnormality identified. Mild for age
cervical spine degeneration.

Upper chest: Negative.

Other neck: Subcentimeter left thyroid nodules, Not clinically
significant; no follow-up imaging recommended (ref: [HOSPITAL]. [DATE]): 143-50).

No acute finding in the neck.

Aortic arch: 3 vessel arch configuration. Mild arch calcified
atherosclerosis.

Right carotid system: Negative aside from subtle beaded appearance
of the cervical ICA distal to the bulb (series 11, image 27).

Left carotid system: Negative left CCA and left carotid bifurcation.
Mild tortuosity and beaded appearance distal to the bulb (also on
image 27).

Vertebral arteries:
Negative. Right vertebral artery is dominant.

CTA HEAD

Posterior circulation: Dominant right V4 segment. No distal right
vertebral artery plaque or stenosis. Non dominant left V4 segment
with normal left PICA origin. Mildly fenestrated vertebrobasilar
junction, doubt significant left vertebrobasilar junction stenosis.
Right AICA appears dominant and patent. Patent basilar artery with
mild tortuosity but no stenosis. Ectatic basilar tip and left PCA P1
segment. Normal SCA and right PCA origins. Posterior communicating
arteries are diminutive or absent. Bilateral PCA branches are within
normal limits.

Anterior circulation: Both ICA siphons are patent. No significant
siphon plaque or stenosis. Infundibulum of the supraclinoid right
ICA suspected on series 9, image 95. Patent carotid termini, MCA and
ACA origins. Right A1 segment appears dominant, the left A1 segment
is diminutive and mildly irregular. Normal anterior communicating
artery and bilateral ACA branches. Left MCA M1 segment and
trifurcation are patent without stenosis. Right MCA M1 segment and
bifurcation are patent without stenosis. Bilateral MCA branches are
mildly tortuous but otherwise normal.

Venous sinuses: Patent. Dominant left transverse and sigmoid sinus.

Anatomic variants: Dominant right vertebral artery.

Review of the MIP images confirms the above findings
IMPRESSION: 1. Negative for large vessel occlusion.
2. Positive for mild bilateral cervical ICA Fibromuscular Dysplasia
(FMD).
3. No significant arterial atherosclerosis or stenosis in the head
or neck.
4. Mild aortic arch atherosclerosis.

## 2021-12-11 MED ORDER — ASPIRIN 81 MG PO TBEC
81.0000 mg | DELAYED_RELEASE_TABLET | Freq: Every day | ORAL | Status: DC
  Administered 2021-12-11: 81 mg via ORAL
  Filled 2021-12-11: qty 1

## 2021-12-11 MED ORDER — ASPIRIN 81 MG PO TBEC: 81.0000 mg | DELAYED_RELEASE_TABLET | Freq: Every day | ORAL | 12 refills | Status: DC

## 2021-12-11 MED ORDER — IOHEXOL 350 MG/ML SOLN
100.0000 mL | Freq: Once | INTRAVENOUS | Status: AC | PRN
  Administered 2021-12-11: 100 mL via INTRAVENOUS

## 2021-12-11 NOTE — Progress Notes (Signed)
  Echocardiogram 2D Echocardiogram has been performed.  Kimberly Zhang 12/11/2021, 10:16 AM

## 2021-12-11 NOTE — ED Notes (Signed)
Pt ambulatory to waiting room. Pt verbalized understanding of discharge instructions.   

## 2021-12-11 NOTE — ED Notes (Signed)
Pt ambulatory to restroom

## 2021-12-11 NOTE — Evaluation (Signed)
Physical Therapy Evaluation Patient Details Name: Kimberly Zhang MRN: [id number] DOB: Mar 10, 1966 Today's Date: 12/11/2021  History of Present Illness  56 y.o. female presents to [facility] on 12/10/2021 from PCP with R numbness and slurred speech. MRI and CT head unremarkable. PMH includes migraine, HLD.  Clinical Impression  Pt presents to PT with reports of resolution of numbness and tingling. Pt reports mild instability with dynamic balance tasks, increased sway with rhomberg eyes closed and single leg stance. PT anticipates the patient will progress quickly with increased time and opportunity to mobilize. PT recommends no follow-up at this time.       Recommendations for follow up therapy are one component of a multi-disciplinary discharge planning process, led by the attending physician.  Recommendations may be updated based on patient status, additional functional criteria and insurance authorization.  Follow Up Recommendations No PT follow up    Assistance Recommended at Discharge None  Patient can return home with the following       Equipment Recommendations None recommended by PT  Recommendations for Other Services       Functional Status Assessment Patient has had a recent decline in their functional status and demonstrates the ability to make significant improvements in function in a reasonable and predictable amount of time. (mild instability compared to baseline)     Precautions / Restrictions Precautions Precautions: None Restrictions Weight Bearing Restrictions: No      Mobility  Bed Mobility Overal bed mobility: Modified Independent                  Transfers Overall transfer level: Independent                      Ambulation/Gait Ambulation/Gait assistance: Modified independent (Device/Increase time) Gait Distance (Feet): 250 Feet Assistive device: None Gait Pattern/deviations: Step-through pattern Gait velocity: reduced Gait  velocity interpretation: 1.31 - 2.62 ft/sec, indicative of limited community ambulator   General Gait Details: pt with slowed step-through gait, reduced gait speed with head turns, increased time to change gait speed, able to change stride length with steps for setup  Stairs            Wheelchair Mobility    Modified Rankin (Stroke Patients Only)       Balance Overall balance assessment: Needs assistance Sitting-balance support: No upper extremity supported, Feet supported Sitting balance-Leahy Scale: Good     Standing balance support: No upper extremity supported, During functional activity Standing balance-Leahy Scale: Good   Single Leg Stance - Right Leg: 10       Rhomberg - Eyes Opened: 20 (stopped by PT at 20 seconds) Rhomberg - Eyes Closed: 30   High Level Balance Comments: pt retrieves object from the floor with squat             Pertinent Vitals/Pain Pain Assessment Pain Assessment: No/denies pain    Home Living Family/patient expects to be discharged to:: Private residence Living Arrangements: Spouse/significant other;Children;Other relatives Available Help at Discharge: Family;Available PRN/intermittently Type of Home: House Home Access: Level entry     Alternate Level Stairs-Number of Steps: flight Home Layout: Two level Home Equipment: None      Prior Function Prior Level of Function : Independent/Modified Independent;Working/employed;Driving             Mobility Comments: 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       [employer]        Extremity/Trunk Assessment   Upper Extremity Assessment Upper  Extremity Assessment: Overall WFL for tasks assessed    Lower Extremity Assessment Lower Extremity Assessment: Overall WFL for tasks assessed    Cervical / Trunk Assessment Cervical / Trunk Assessment: Normal  Communication   Communication: No difficulties  Cognition Arousal/Alertness: Awake/alert Behavior During Therapy: WFL  for tasks assessed/performed Overall Cognitive Status: Within Functional Limits for tasks assessed                                          General Comments General comments (skin integrity, edema, etc.): VSS on RA    Exercises     Assessment/Plan    PT Assessment Patient needs continued PT services  PT Problem List Decreased balance       PT Treatment Interventions Gait training;Balance training;Neuromuscular re-education;Patient/family education    PT Goals (Current goals can be found in the Care Plan section)  Acute Rehab PT Goals Patient Stated Goal: to return home PT Goal Formulation: With patient Time For Goal Achievement: 12/25/21 Potential to Achieve Goals: Good Additional Goals Additional Goal #1: Pt will score >45/56 on the BERG balance test to indicate a reduced risk for falls Additional Goal #2: Pt will score >19/24 on the DGI to indicate a reduced risk for falls    Frequency Min 2X/week     Co-evaluation               AM-PAC PT "6 Clicks" Mobility  Outcome Measure Help needed turning from your back to your side while in a flat bed without using bedrails?: None Help needed moving from lying on your back to sitting on the side of a flat bed without using bedrails?: None Help needed moving to and from a bed to a chair (including a wheelchair)?: None Help needed standing up from a chair using your arms (e.g., wheelchair or bedside chair)?: None Help needed to walk in hospital room?: A Little Help needed climbing 3-5 steps with a railing? : A Little 6 Click Score: 22    End of Session   Activity Tolerance: Patient tolerated treatment well Patient left: in bed;with call bell/phone within reach Nurse Communication: Mobility status PT Visit Diagnosis: Other abnormalities of gait and mobility (R26.89)    Time: 1694-5038 PT Time Calculation (min) (ACUTE ONLY): 13 min   Charges:   PT Evaluation $PT Eval Low Complexity: 1 Low           Zenaida Niece, PT, DPT Acute Rehabilitation Pager: [phone] Office [phone]   Zenaida Niece 12/11/2021, 9:06 AM

## 2021-12-11 NOTE — Discharge Summary (Signed)
Physician Discharge Summary  Kimberly Zhang EXB:[id number] DOB: 1965-09-03 DOA: 12/10/2021  PCP: Unk Pinto, MD  Admit date: 12/10/2021 Discharge date: 12/11/2021  Admitted From: Home Disposition: Home  Recommendations for Outpatient Follow-up:  Follow up with PCP in 1-2 weeks Follow-up with neurology as scheduled:  Discharge Condition: Stable CODE STATUS: Full Diet recommendation: Low-salt low-fat diet  Brief/Interim Summary: Kimberly Zhang is a 56 y.o. female with medical history significant of hyperlipidemia, migraines (unspecified subtype), anxiety and depression who presents to the ED from PCP office with acute onset right-sided numbness and reported slurred speech.  Last known normal was Thursday night, she is outside the window for tPA consideration.  Unclear time of onset but symptoms were noted at 6:30 AM, neurology has been consulted recommending inpatient evaluation for TIA given negative CT and MRI at this point.  Hospitalist called for admission.  Patient admitted overnight per neurology request for ongoing TIA work-up.  Remainder of imaging including CTA head and neck as well as echocardiogram were unrevealing.  At this time given negative findings neurology recommending low-dose aspirin and outpatient follow-up with PCP for ongoing testing.  Patient will need evaluation for PSK 9 inhibitor given her intolerance and resistance to typical anticholesterol medications including statins and fibrate's.  Patient otherwise stable and agreeable for discharge  Discharge Diagnoses:  Principal Problem:   Transient ischemic attack (TIA) Active Problems:   Anxiety   Migraine   Obesity (BMI 30.0-34.9)    Discharge Instructions  Discharge Instructions     Ambulatory referral to Neurology   Complete by: As directed    An appointment is requested in approximately: 8 weeks   Discharge patient   Complete by: As directed    Discharge disposition: 01-Home or Self Care    Discharge patient date: 12/11/2021      Allergies as of 12/11/2021       Reactions   Shellfish Allergy Anaphylaxis   Shellfish-derived Products Anaphylaxis   Atorvastatin    Severe nausea   Rosuvastatin    Nausea, GI        Medication List     TAKE these medications    aspirin EC 81 MG tablet Take 1 tablet (81 mg total) by mouth daily. Swallow whole. Start taking on: Dec 12, 2021   buPROPion 150 MG 24 hr tablet Commonly known as: WELLBUTRIN XL TAKE 1 TABLET BY MOUTH EVERY MORNING FOR MOOD AND ENERGY What changed: See the new instructions.   escitalopram 20 MG tablet Commonly known as: LEXAPRO TAKE 1 TABLET BY MOUTH DAILY FOR MOOD What changed: See the new instructions.   levocetirizine 5 MG tablet Commonly known as: XYZAL TAKE 1 TABLET BY MOUTH DAILY FOR ALLERGIES What changed: See the new instructions.   Nexlizet 180-10 MG Tabs Generic drug: Bempedoic Acid-Ezetimibe Take 1 tablet by mouth daily.   topiramate 100 MG tablet Commonly known as: TOPAMAX Take 1 tablet  at Bedtime  for Migraine Prevention & Weight Loss What changed:  how much to take how to take this when to take this additional instructions   Ubrelvy 50 MG Tabs Generic drug: Ubrogepant Take 50 mg by mouth as needed. What changed: reasons to take this   verapamil 120 MG CR tablet Commonly known as: CALAN-SR TAKE 1 TABLET(120 MG) BY MOUTH AT BEDTIME   Vitamin D (Ergocalciferol) 1.25 MG (50000 UNIT) Caps capsule Commonly known as: DRISDOL TAKE 1 CAPSULE BY MOUTH 3 DAYS A WEEK What changed:  how much to take how to take  this        Follow-up Information     Garvin Fila, MD. Schedule an appointment as soon as possible for a visit in 12 week(s).   Specialties: Neurology, Radiology Contact information: [address] [place] [address] [phone]                Allergies  Allergen Reactions   Shellfish Allergy Anaphylaxis   Shellfish-Derived Products  Anaphylaxis   Atorvastatin     Severe nausea   Rosuvastatin     Nausea, GI    Consultations: Neurology  Procedures/Studies: CT ANGIO HEAD NECK W WO CM  Result Date: 12/11/2021 CLINICAL DATA:  56 year old female TIA. EXAM: CT ANGIOGRAPHY HEAD AND NECK TECHNIQUE: Multidetector CT imaging of the head and neck was performed using the standard protocol during bolus administration of intravenous contrast. Multiplanar CT image reconstructions and MIPs were obtained to evaluate the vascular anatomy. Carotid stenosis measurements (when applicable) are obtained utilizing NASCET criteria, using the distal internal carotid diameter as the denominator. RADIATION DOSE REDUCTION: This exam was performed according to the departmental dose-optimization program which includes automated exposure control, adjustment of the mA and/or kV according to patient size and/or use of iterative reconstruction technique. CONTRAST:  177m OMNIPAQUE IOHEXOL 350 MG/ML SOLN COMPARISON:  Brain MRI and head CT 12/10/2021. FINDINGS: CTA NECK Skeleton: No acute osseous abnormality identified. Mild for age cervical spine degeneration. Upper chest: Negative. Other neck: Subcentimeter left thyroid nodules, Not clinically significant; no follow-up imaging recommended (ref: J Am Coll Radiol. 2015 Feb;12(2): 143-50). No acute finding in the neck. Aortic arch: 3 vessel arch configuration. Mild arch calcified atherosclerosis. Right carotid system: Negative aside from subtle beaded appearance of the cervical ICA distal to the bulb (series 11, image 27). Left carotid system: Negative left CCA and left carotid bifurcation. Mild tortuosity and beaded appearance distal to the bulb (also on image 27). Vertebral arteries: Negative. Right vertebral artery is dominant. CTA HEAD Posterior circulation: Dominant right V4 segment. No distal right vertebral artery plaque or stenosis. Non dominant left V4 segment with normal left PICA origin. Mildly fenestrated  vertebrobasilar junction, doubt significant left vertebrobasilar junction stenosis. Right AICA appears dominant and patent. Patent basilar artery with mild tortuosity but no stenosis. Ectatic basilar tip and left PCA P1 segment. Normal SCA and right PCA origins. Posterior communicating arteries are diminutive or absent. Bilateral PCA branches are within normal limits. Anterior circulation: Both ICA siphons are patent. No significant siphon plaque or stenosis. Infundibulum of the supraclinoid right ICA suspected on series 9, image 95. Patent carotid termini, MCA and ACA origins. Right A1 segment appears dominant, the left A1 segment is diminutive and mildly irregular. Normal anterior communicating artery and bilateral ACA branches. Left MCA M1 segment and trifurcation are patent without stenosis. Right MCA M1 segment and bifurcation are patent without stenosis. Bilateral MCA branches are mildly tortuous but otherwise normal. Venous sinuses: Patent. Dominant left transverse and sigmoid sinus. Anatomic variants: Dominant right vertebral artery. Review of the MIP images confirms the above findings IMPRESSION: 1. Negative for large vessel occlusion. 2. 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 3. No significant arterial atherosclerosis or stenosis in the head or neck. 4. Mild aortic arch atherosclerosis. Electronically Signed   By: HGenevie AnnM.D.   On: 12/11/2021 08:25   MR BRAIN WO CONTRAST  Result Date: 12/10/2021 CLINICAL DATA:  Neuro deficit, acute, stroke suspected; right-sided weakness, facial droop and slurred speech EXAM: MRI HEAD WITHOUT CONTRAST  TECHNIQUE: Multiplanar, multiecho pulse sequences of the brain and surrounding structures were obtained without intravenous contrast. COMPARISON:  None Available. FINDINGS: Brain: There is no acute infarction or intracranial hemorrhage. There is no intracranial mass, mass effect, or edema. There is no hydrocephalus or extra-axial fluid  collection. Ventricles and sulci are normal in size and configuration. Patchy small foci of T2 hyperintensity in the supratentorial white matter are nonspecific but could reflect minor chronic microvascular ischemic changes. Vascular: Major vessel flow voids at the skull base are preserved. Skull and upper cervical spine: Normal marrow signal is preserved. Sinuses/Orbits: Paranasal sinuses are aerated. Orbits are unremarkable. Other: Sella is unremarkable.  Mastoid air cells are clear. IMPRESSION: No acute infarction, hemorrhage, or mass. Probable minor chronic microvascular ischemic changes. Electronically Signed   By: Macy Mis M.D.   On: 12/10/2021 13:59   ECHOCARDIOGRAM COMPLETE  Result Date: 12/11/2021    ECHOCARDIOGRAM REPORT   Patient Name:   DESIREY KEAHEY Date of Exam: 12/11/2021 Medical Rec #:  [id number]         Height:       62.8 in Accession #:    [id number]        Weight:       175.9 lb Date of Birth:  1965-07-30        BSA:          1.826 m Patient Age:    45 years          BP:           132/81 mmHg Patient Gender: F                 HR:           61 bpm. Exam Location:  Inpatient Procedure: 2D Echo Indications:    TIA  History:        Patient has no prior history of Echocardiogram examinations.                 Risk Factors:Dyslipidemia.  Sonographer:    Johny Chess RDCS Referring Phys: [id number] Little Ishikawa  Sonographer Comments: Image acquisition challenging due to patient body habitus. IMPRESSIONS  1. Left ventricular ejection fraction, by estimation, is 60 to 65%. The left ventricle has normal function. The left ventricle has no regional wall motion abnormalities. Left ventricular diastolic parameters were normal.  2. Right ventricular systolic function is normal. The right ventricular size is normal.  3. Mild mitral valve regurgitation.  4. The aortic valve is normal in structure. Aortic valve regurgitation is not visualized.  5. The inferior vena cava is normal in size with  greater than 50% respiratory variability, suggesting right atrial pressure of 3 mmHg. FINDINGS  Left Ventricle: Left ventricular ejection fraction, by estimation, is 60 to 65%. The left ventricle has normal function. The left ventricle has no regional wall motion abnormalities. The left ventricular internal cavity size was normal in size. There is  borderline left ventricular hypertrophy. Left ventricular diastolic parameters were normal. Right Ventricle: The right ventricular size is normal. Right vetricular wall thickness was not assessed. Right ventricular systolic function is normal. Left Atrium: Left atrial size was normal in size. Right Atrium: Right atrial size was normal in size. Pericardium: There is no evidence of pericardial effusion. Mitral Valve: There is mild thickening of the mitral valve leaflet(s). There is mild calcification of the mitral valve leaflet(s). Mild mitral valve regurgitation. Tricuspid Valve: The tricuspid valve is normal in structure. Tricuspid valve regurgitation is  trivial. Aortic Valve: The aortic valve is normal in structure. Aortic valve regurgitation is not visualized. Pulmonic Valve: The pulmonic valve was normal in structure. Pulmonic valve regurgitation is trivial. Aorta: The aortic root and ascending aorta are structurally normal, with no evidence of dilitation. Venous: The inferior vena cava is normal in size with greater than 50% respiratory variability, suggesting right atrial pressure of 3 mmHg. IAS/Shunts: No atrial level shunt detected by color flow Doppler.  LEFT VENTRICLE PLAX 2D LVIDd:         4.20 cm   Diastology LVIDs:         3.00 cm   LV e' medial:    6.74 cm/s LV PW:         1.00 cm   LV E/e' medial:  8.4 LV IVS:        1.10 cm   LV e' lateral:   6.31 cm/s LVOT diam:     1.90 cm   LV E/e' lateral: 9.0 LV SV:         50 LV SV Index:   27 LVOT Area:     2.84 cm  RIGHT VENTRICLE         IVC TAPSE (M-mode): 1.9 cm  IVC diam: 1.50 cm LEFT ATRIUM             Index         RIGHT ATRIUM          Index LA diam:        2.90 cm 1.59 cm/m   RA Area:     7.89 cm LA Vol (A2C):   35.4 ml 19.39 ml/m  RA Volume:   13.30 ml 7.28 ml/m LA Vol (A4C):   33.2 ml 18.18 ml/m LA Biplane Vol: 34.5 ml 18.89 ml/m  AORTIC VALVE LVOT Vmax:   90.70 cm/s LVOT Vmean:  56.200 cm/s LVOT VTI:    0.175 m  AORTA Ao Root diam: 2.70 cm Ao Asc diam:  2.70 cm MITRAL VALVE MV Area (PHT): 3.17 cm    SHUNTS MV Decel Time: 239 msec    Systemic VTI:  0.18 m MV E velocity: 56.80 cm/s  Systemic Diam: 1.90 cm MV A velocity: 76.40 cm/s MV E/A ratio:  0.74 Dorris Carnes MD Electronically signed by Dorris Carnes MD Signature Date/Time: 12/11/2021/1:28:39 PM    Final    CT HEAD CODE STROKE WO CONTRAST  Result Date: 12/10/2021 CLINICAL DATA:  Code stroke. EXAM: CT HEAD WITHOUT CONTRAST TECHNIQUE: Contiguous axial images were obtained from the base of the skull through the vertex without intravenous contrast. RADIATION DOSE REDUCTION: This exam was performed according to the departmental dose-optimization program which includes automated exposure control, adjustment of the mA and/or kV according to patient size and/or use of iterative reconstruction technique. COMPARISON:  CT head 04/11/2012 FINDINGS: Brain: There is no acute intracranial hemorrhage, extra-axial fluid collection, or acute infarct. Parenchymal volume is normal. The ventricles are normal in size. Gray-white differentiation is preserved. There is no mass lesion.  There is no mass effect or midline shift. Vascular: No dense vessel is seen. Skull: Normal. Negative for fracture or focal lesion. Sinuses/Orbits: The paranasal sinuses are clear. The globes and orbits are unremarkable. Other: None. ASPECTS [facility] Stroke Program Early CT Score) - Ganglionic level infarction (caudate, lentiform nuclei, internal capsule, insula, M1-M3 cortex): 7 - Supraganglionic infarction (M4-M6 cortex): 3 Total score (0-10 with 10 being normal): 10 IMPRESSION: 1. No acute  intracranial pathology. 2. ASPECTS is 10 These results  were paged via AMION at the time of interpretation on 12/10/2021 at 11:42 am to provider Dr Cheral Marker. Electronically Signed   By: Valetta Mole M.D.   On: 12/10/2021 11:45     Subjective: No acute issues or events overnight   Discharge Exam: Vitals:   12/11/21 0937 12/11/21 1416  BP: 127/77 118/71  Pulse: 63 80  Resp: 15 17  Temp:  98.2 F (36.8 C)  SpO2: 100% 98%   Vitals:   12/11/21 0730 12/11/21 0830 12/11/21 0937 12/11/21 1416  BP:   127/77 118/71  Pulse: 61 83 63 80  Resp: '16 16 15 17  '$ Temp:    98.2 F (36.8 C)  TempSrc:    Oral  SpO2: 98% 100% 100% 98%  Weight:        General: Pt is alert, awake, not in acute distress Cardiovascular: RRR, S1/S2 +, no rubs, no gallops Respiratory: CTA bilaterally, no wheezing, no rhonchi Abdominal: Soft, NT, ND, bowel sounds + Extremities: no edema, no cyanosis    The results of significant diagnostics from this hospitalization (including imaging, microbiology, ancillary and laboratory) are listed below for reference.     Microbiology: No results found for this or any previous visit (from the past 240 hour(s)).   Labs: BNP (last 3 results) No results for input(s): BNP in the last 8760 hours. Basic Metabolic Panel: Recent Labs  Lab 12/10/21 1130 12/10/21 1137 12/10/21 1943  NA 139 140  --   K 4.3 4.3  --   CL 110 108  --   CO2 22  --   --   GLUCOSE 109* 110*  --   BUN 7 7  --   CREATININE 0.80 0.70 0.76  CALCIUM 9.5  --   --    Liver Function Tests: Recent Labs  Lab 12/10/21 1130  AST 13*  ALT 13  ALKPHOS 103  BILITOT 0.5  PROT 6.6  ALBUMIN 3.7   No results for input(s): LIPASE, AMYLASE in the last 168 hours. No results for input(s): AMMONIA in the last 168 hours. CBC: Recent Labs  Lab 12/10/21 1130 12/10/21 1137 12/10/21 1943  WBC 8.8  --  8.7  NEUTROABS 4.0  --   --   HGB 15.3* 16.0* 14.3  HCT 47.0* 47.0* 44.3  MCV 86.1  --  86.2  PLT 261  --   272   Cardiac Enzymes: No results for input(s): CKTOTAL, CKMB, CKMBINDEX, TROPONINI in the last 168 hours. BNP: Invalid input(s): POCBNP CBG: Recent Labs  Lab 12/10/21 1128  GLUCAP 119*   D-Dimer No results for input(s): DDIMER in the last 72 hours. Hgb A1c Recent Labs    12/10/21 1943  HGBA1C 5.4   Lipid Profile No results for input(s): CHOL, HDL, LDLCALC, TRIG, CHOLHDL, LDLDIRECT in the last 72 hours. Thyroid function studies No results for input(s): TSH, T4TOTAL, T3FREE, THYROIDAB in the last 72 hours.  Invalid input(s): FREET3 Anemia work up No results for input(s): VITAMINB12, FOLATE, FERRITIN, TIBC, IRON, RETICCTPCT in the last 72 hours. Urinalysis    Component Value Date/Time   COLORURINE YELLOW 06/03/2021 [place] 06/03/2021 1055   LABSPEC 1.019 06/03/2021 1055   PHURINE 5.5 06/03/2021 1055   GLUCOSEU NEGATIVE 06/03/2021 1055   HGBUR 1+ (A) 06/03/2021 1055   BILIRUBINUR NEGATIVE 03/21/2016 [place] 06/03/2021 1055   PROTEINUR NEGATIVE 06/03/2021 1055   UROBILINOGEN 0.2 10/06/2014 0851   NITRITE NEGATIVE 06/03/2021 [place] 06/03/2021 1055  Sepsis Labs Invalid input(s): PROCALCITONIN,  WBC,  LACTICIDVEN Microbiology No results found for this or any previous visit (from the past 240 hour(s)).   Time coordinating discharge: Over 30 minutes  SIGNED:  Little Ishikawa, DO [facility] 12/11/2021, 8:57 PM Pager   If 7PM-7AM, please contact night-coverage [email or web address]

## 2021-12-11 NOTE — Progress Notes (Addendum)
STROKE TEAM PROGRESS NOTE   INTERVAL HISTORY She noticed delayed speech, right side weakness/numbness that has since improved. Patient endorses a vegan diet. She states that her cholesterol/LDL has been hard to control for years and she has been informed that it is genetic. She has not tried an PCSK9 inhibitor yet, we recommend initiating this outpatient. She is in agreement. Her migraines have been well controlled with verapamil, Topamax, and Ubrelvy.  She has seen Kimberly Zhang neurology in the past.  Vitals:   12/10/21 1915 12/10/21 2228 12/11/21 0133 12/11/21 0500  BP: (!) 120/56 (!) 112/58 (!) 119/52 118/71  Pulse: (!) 55 65 (!) 55 (!) 56  Resp: '20 10 17 18  '$ Temp:   98 F (36.7 C) 98.5 F (36.9 C)  TempSrc:   Oral Oral  SpO2: 100% 100% 98% 100%  Weight:       CBC:  Recent Labs  Lab 12/10/21 1130 12/10/21 1137 12/10/21 1943  WBC 8.8  --  8.7  NEUTROABS 4.0  --   --   HGB 15.3* 16.0* 14.3  HCT 47.0* 47.0* 44.3  MCV 86.1  --  86.2  PLT 261  --  409   Basic Metabolic Panel:  Recent Labs  Lab 12/10/21 1130 12/10/21 1137 12/10/21 1943  NA 139 140  --   K 4.3 4.3  --   CL 110 108  --   CO2 22  --   --   GLUCOSE 109* 110*  --   BUN 7 7  --   CREATININE 0.80 0.70 0.76  CALCIUM 9.5  --   --    Lipid Panel: No results for input(s): CHOL, TRIG, HDL, CHOLHDL, VLDL, LDLCALC in the last 168 hours. HgbA1c:  Recent Labs  Lab 12/10/21 1943  HGBA1C 5.4   Urine Drug Screen: No results for input(s): LABOPIA, COCAINSCRNUR, LABBENZ, AMPHETMU, THCU, LABBARB in the last 168 hours.  Alcohol Level No results for input(s): ETH in the last 168 hours.  IMAGING past 24 hours MR BRAIN WO CONTRAST  Result Date: 12/10/2021 CLINICAL DATA:  Neuro deficit, acute, stroke suspected; right-sided weakness, facial droop and slurred speech EXAM: MRI HEAD WITHOUT CONTRAST TECHNIQUE: Multiplanar, multiecho pulse sequences of the brain and surrounding structures were obtained without intravenous contrast.  COMPARISON:  None Available. FINDINGS: Brain: There is no acute infarction or intracranial hemorrhage. There is no intracranial mass, mass effect, or edema. There is no hydrocephalus or extra-axial fluid collection. Ventricles and sulci are normal in size and configuration. Patchy small foci of T2 hyperintensity in the supratentorial white matter are nonspecific but could reflect minor chronic microvascular ischemic changes. Vascular: Major vessel flow voids at the skull base are preserved. Skull and upper cervical spine: Normal marrow signal is preserved. Sinuses/Orbits: Paranasal sinuses are aerated. Orbits are unremarkable. Other: Sella is unremarkable.  Mastoid air cells are clear. IMPRESSION: No acute infarction, hemorrhage, or mass. Probable minor chronic microvascular ischemic changes. Electronically Signed   By: Macy Mis M.D.   On: 12/10/2021 13:59   CT HEAD CODE STROKE WO CONTRAST  Result Date: 12/10/2021 CLINICAL DATA:  Code stroke. EXAM: CT HEAD WITHOUT CONTRAST TECHNIQUE: Contiguous axial images were obtained from the base of the skull through the vertex without intravenous contrast. RADIATION DOSE REDUCTION: This exam was performed according to the departmental dose-optimization program which includes automated exposure control, adjustment of the mA and/or kV according to patient size and/or use of iterative reconstruction technique. COMPARISON:  CT head 04/11/2012 FINDINGS: Brain: There is no  acute intracranial hemorrhage, extra-axial fluid collection, or acute infarct. Parenchymal volume is normal. The ventricles are normal in size. Gray-white differentiation is preserved. There is no mass lesion.  There is no mass effect or midline shift. Vascular: No dense vessel is seen. Skull: Normal. Negative for fracture or focal lesion. Sinuses/Orbits: The paranasal sinuses are clear. The globes and orbits are unremarkable. Other: None. ASPECTS [facility] Stroke Program Early CT Score) - Ganglionic  level infarction (caudate, lentiform nuclei, internal capsule, insula, M1-M3 cortex): 7 - Supraganglionic infarction (M4-M6 cortex): 3 Total score (0-10 with 10 being normal): 10 IMPRESSION: 1. No acute intracranial pathology. 2. ASPECTS is 10 These results were paged via AMION at the time of interpretation on 12/10/2021 at 11:42 am to provider Dr Cheral Marker. Electronically Signed   By: Valetta Mole M.D.   On: 12/10/2021 11:45    PHYSICAL EXAM LUNGS - Clear to auscultation bilaterally with no wheezes CV - S1S2 RRR, no m/r/g, equal pulses bilaterally.   NEURO:  Mental Status: AA&Ox3, Follows commands. Pleasant, cooperative . Language: speech is stuttering /slow  Cranial Nerves: PERRL 31m/brisk. EOMI, visual fields full, no facial asymmetry, facial sensation intact, hearing intact, tongue/uvula/soft palate midline, normal sternocleidomastoid and trapezius muscle strength. No evidence of tongue atrophy  Motor: No drift in any limb. Tone is normal and bulk is normal Sensation- Intact to light touch bilaterally Coordination: FTN intact bilaterally, no ataxia in BLE. Gait- deferred  ASSESSMENT/PLAN Ms. RBINTOU LAFATAis a 56y.o. female with history of anxiety, HLD, migraine, vitamin D def presenting with right side numbness and slurred speech.  Start baby aspirin daily.  Recommend follow-up outpatient with PCP for LDL.  MRI negative for acute infarct.  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  Follow-up with [facility] in 8 to 12 weeks.  TIA vs complicated migraine Code Stroke CT head No acute abnormality. ASPECTS 10.    CTA head & neck bilateral cervical ICA fibromuscular dysplasia MRI  Probable minor chronic microvascular ischemic changes. 2D Echo EF is 60-65%, LV normal function LDL 319 HgbA1c 5.4 VTE prophylaxis - Lovenox    Diet   Diet regular Room service appropriate? Yes; Fluid consistency: Thin   No antithrombotic prior to admission, now on aspirin 81 mg daily.  Therapy recommendations:   No follow up recommended Disposition: Discharge Home  Hypertension Stable Permissive hypertension (OK if < 220/120) but gradually normalize in 5-7 days Long-term BP goal normotensive  Hyperlipidemia Home meds: None- statin allergy LDL 319, goal < 70 Recommend initiating PCSK9 inhibitor outpatient  Other Stroke Risk Factors Obesity, Body mass index is 31.41 kg/m., BMI >/= 30 associated with increased stroke risk, recommend weight loss, diet and exercise as appropriate  Migraines Home meds: Topamax and ubrelvy  Other Active Problems Depression-Wellbutrin, Lexapro  Hospital day # 0  Patient seen and examined by NP/APP with MD. MD to update note as needed.   DJanine Ores DNP, FNP-BC [facility] Pager: [phone] ATTENDING ATTESTATION:  56year old with right-sided numbness facial weakness and slurred speech that resolved.  MRI is negative for CVA.  She does have a history of migraine but does not have those kind of symptoms with her migraine at baseline.  Likely TIA.  Continue aspirin.  Discussed need to get her LDL under control .  She cannot tolerate statins.  Discussed with her newer injectable medication such as Repatha for her hypercholesterolemia.  She understands this and will have to be pursued outpatient follow-up in stroke clinic as above.  All of her questions answered to her satisfaction.  Dr. Reeves Forth evaluated pt independently, reviewed imaging, chart, labs. Discussed and formulated plan with the APP. Please see APP note above for details.   Total 36 minutes spent on counseling patient and coordinating care, writing notes and reviewing chart.    Kimberly Wartman,MD   To contact Stroke Continuity provider, please refer to [email or web address] After hours, contact General Neurology

## 2021-12-13 NOTE — Progress Notes (Unsigned)
Hospital follow up  Assessment and Plan: Hospital visit follow up for:   Kimberly Zhang was seen today for hospitalization follow-up.  Diagnoses and all orders for this visit:  Hyperlipidemia, unspecified hyperlipidemia type Begin aspirin and will refer to lipid clinic for evaluation for  -     AMB Referral to [facility] - Lipid panel  Transient ischemic attack (TIA)/Aphasia/Right sided weakness Will try to have evaluated at neuro ASAP Referral made for neuro rehab Note for out of work x 1 month, f/u in 4 weeks -     COMPLETE METABOLIC PANEL WITH GFR -     AMB Referral to [facility] -     Ambulatory referral to Neurology   Other migraine without status migrainosus, not intractable Continue medication and monitor  Obesity (BMI 30.0-34.9) Continue diet and exercise Limit saturated fats and simple carbs  Hypotension Monitor BP if running consistently less than 100/60 please notify the office -     CBC with Differential/Platelet  Medication management -     CBC with Differential/Platelet -     COMPLETE METABOLIC PANEL WITH GFR     All medications were reviewed with patient and family and fully reconciled. All questions answered fully, and patient and family members were encouraged to call the office with any further questions or concerns. Discussed goal to avoid readmission related to this diagnosis.  Medications Discontinued During This Encounter  Medication Reason   verapamil (CALAN-SR) 120 MG CR tablet Allergic reaction   Bempedoic Acid-Ezetimibe (NEXLIZET) 180-10 MG TABS     CAN NOT DO FOR BCBS REGULAR OR MEDICARE  Over 40 minutes of exam, counseling, chart review, and complex, high/moderate level critical decision making was performed this visit.   Future Appointments  Date Time Provider [place]  01/18/2022 10:30 AM Penni Bombard, MD [facility] None  06/03/2022 10:00 AM Magda Bernheim, NP [facility] None      HPI 56 y.o.female presents for follow up for transition from recent hospitalization or SNIF stay. Admit date to the hospital was 12/10/21, patient was discharged from the hospital on 12/11/21 and our clinical staff contacted the office the day after discharge to set up a follow up appointment. The discharge summary, medications, and diagnostic test results were reviewed before meeting with the patient. The patient was admitted for:  Kimberly Zhang is a 56 y.o. female with medical history significant of hyperlipidemia, migraines (unspecified subtype), anxiety and depression who presents to the ED from PCP office with acute onset right-sided numbness and reported slurred speech.  Last known normal was Thursday night, she is outside the window for tPA consideration.  Unclear time of onset but symptoms were noted at 6:30 AM, neurology has been consulted recommending inpatient evaluation for TIA given negative CT and MRI at this point.  Hospitalist called for admission.   Patient admitted overnight per neurology request for ongoing TIA work-up.  Remainder of imaging including CTA head and neck as well as echocardiogram were unrevealing.  At this time given negative findings neurology recommending low-dose aspirin and outpatient follow-up with PCP for ongoing testing.  Patient will need evaluation for PSK 9 inhibitor given her intolerance and resistance to typical anticholesterol medications including statins and fibrate's.  Patient otherwise stable and agreeable for discharge   Discharge Diagnoses:  Principal Problem:   Transient ischemic attack (TIA) Active Problems:   Anxiety   Migraine   Obesity (BMI 30.0-34.9)  She continues to have more difficulty with speech.  She knows what she wants to say but cannot get the words to come out.  Feels the speech difficulties have not greatly improved.  She also has a lot of fatigue, sleeping longer at night and sleeping more during the day. Difficulty  reading. Very emotional at today's visit.   Her blood pressure is well controlled.  Denies headaches, chest pain, shortness of breath. She does have persistent dizziness. Blood pressure continues to run low. She is also having some ringing in her ears.  BP Readings from Last 3 Encounters:  12/14/21 100/62  12/11/21 118/71  06/03/21 100/68   BMI is Body mass index is 30.6 kg/m., she has been working on diet and exercise. Wt Readings from Last 3 Encounters:  12/14/21 171 lb 6.4 oz (77.7 kg)  12/10/21 175 lb 14.8 oz (79.8 kg)  12/10/21 173 lb (78.5 kg)     She does have elevated cholesterol and is unable to tolerate fibrates and statins. Was recommended to have PSK 9 inhibitor Lab Results  Component Value Date   CHOL 392 (H) 06/03/2021   HDL 41 (L) 06/03/2021   LDLCALC 319 (H) 06/03/2021   TRIG 153 (H) 06/03/2021   CHOLHDL 9.6 (H) 06/03/2021      Home health is not involved.   Images while in the hospital: CT ANGIO HEAD NECK W WO CM  Result Date: 12/11/2021 CLINICAL DATA:  56 year old female TIA. EXAM: CT ANGIOGRAPHY HEAD AND NECK TECHNIQUE:. IMPRESSION: 1. Negative for large vessel occlusion. 2. 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 3. No significant arterial atherosclerosis or stenosis in the head or neck. 4. Mild aortic arch atherosclerosis. Electronically Signed   By: Genevie Ann M.D.   On: 12/11/2021 08:25   MR BRAIN WO CONTRAST  Result Date: 12/10/2021 CLINICAL DATA:  Neuro deficit, acute, stroke suspected; right-sided weakness, facial droop and slurred speech EXAM: MRI HEAD WITHOUT CONTRAST TECHNIQUE:  IMPRESSION: No acute infarction, hemorrhage, or mass. Probable minor chronic microvascular ischemic changes. Electronically Signed   By: Macy Mis M.D.   On: 12/10/2021 13:59   ECHOCARDIOGRAM COMPLETE  Result Date: 12/11/2021    ECHOCARDIOGRAM REPORT   Patient Name:   Kimberly Zhang Date of Exam: 12/11/2021 Medical Rec #:  [id number]          Height:       62.8 in Accession #:    [id number]        Weight:       175.9 lb Date of Birth:  [birth year]        BSA:          1.826 m Patient Age:    51 years          BP:           132/81 mmHg Patient Gender: F                 HR:           61 bpm. Exam Location:  Inpatient Procedure: 2D Echo Indications:    TIA  History:        Patient has no prior history of Echocardiogram examinations.                 Risk Factors:Dyslipidemia.  Sonographer:    Johny Chess RDCS Referring Phys: [id number] [place]  Sonographer Comments: Image acquisition challenging due to patient body habitus. IMPRESSIONS  1. Left ventricular ejection fraction, by estimation, is 60 to 65%. The left ventricle  has normal function. The left ventricle has no regional wall motion abnormalities. Left ventricular diastolic parameters were normal.  2. Right ventricular systolic function is normal. The right ventricular size is normal.  3. Mild mitral valve regurgitation.  4. The aortic valve is normal in structure. Aortic valve regurgitation is not visualized.  5. The inferior vena cava is normal in size with greater than 50% respiratory variability, suggesting right atrial pressure of 3 mmHg. FINDINGS  Left Ventricle: Left ventricular ejection fraction, by estimation, is 60 to 65%. The left ventricle has normal function. The left ventricle has no regional wall motion abnormalities. The left ventricular internal cavity size was normal in size. There is  borderline left ventricular hypertrophy. Left ventricular diastolic parameters were normal. Right Ventricle: The right ventricular size is normal. Right vetricular wall thickness was not assessed. Right ventricular systolic function is normal. Left Atrium: Left atrial size was normal in size. Right Atrium: Right atrial size was normal in size. Pericardium: There is no evidence of pericardial effusion. Mitral Valve: There is mild thickening of the mitral valve leaflet(s). There is mild  calcification of the mitral valve leaflet(s). Mild mitral valve regurgitation. Tricuspid Valve: The tricuspid valve is normal in structure. Tricuspid valve regurgitation is trivial. Aortic Valve: The aortic valve is normal in structure. Aortic valve regurgitation is not visualized. Pulmonic Valve: The pulmonic valve was normal in structure. Pulmonic valve regurgitation is trivial. Aorta: The aortic root and ascending aorta are structurally normal, with no evidence of dilitation. Venous: The inferior vena cava is normal in size with greater than 50% respiratory variability, suggesting right atrial pressure of 3 mmHg. IAS/Shunts: No atrial level shunt detected by color flow Doppler.  LEFT VENTRICLE PLAX 2D LVIDd:         4.20 cm   Diastology LVIDs:         3.00 cm   LV e' medial:    6.74 cm/s LV PW:         1.00 cm   LV E/e' medial:  8.4 LV IVS:        1.10 cm   LV e' lateral:   6.31 cm/s LVOT diam:     1.90 cm   LV E/e' lateral: 9.0 LV SV:         50 LV SV Index:   27 LVOT Area:     2.84 cm  RIGHT VENTRICLE         IVC TAPSE (M-mode): 1.9 cm  IVC diam: 1.50 cm LEFT ATRIUM             Index        RIGHT ATRIUM          Index LA diam:        2.90 cm 1.59 cm/m   RA Area:     7.89 cm LA Vol (A2C):   35.4 ml 19.39 ml/m  RA Volume:   13.30 ml 7.28 ml/m LA Vol (A4C):   33.2 ml 18.18 ml/m LA Biplane Vol: 34.5 ml 18.89 ml/m  AORTIC VALVE LVOT Vmax:   90.70 cm/s LVOT Vmean:  56.200 cm/s LVOT VTI:    0.175 m  AORTA Ao Root diam: 2.70 cm Ao Asc diam:  2.70 cm MITRAL VALVE MV Area (PHT): 3.17 cm    SHUNTS MV Decel Time: 239 msec    Systemic VTI:  0.18 m MV E velocity: 56.80 cm/s  Systemic Diam: 1.90 cm MV A velocity: 76.40 cm/s MV E/A ratio:  0.74 Dorris Carnes  MD Electronically signed by Dorris Carnes MD Signature Date/Time: 12/11/2021/1:28:39 PM    Final    CT HEAD CODE STROKE WO CONTRAST  Result Date: 12/10/2021 CLINICAL DATA:  Code stroke. EXAM: CT HEAD WITHOUT CONTRAST TECHNIQUE: Contiguous axial images were obtained  from the base of the skull through the vertex without intravenous contrast. RADIATION DOSE REDUCTION: This exam was performed according to the departmental dose-optimization program which includes automated exposure control, adjustment of the mA and/or kV according to patient size and/or use of iterative reconstruction technique. COMPARISON:  CT head 04/11/2012 IMPRESSION: 1. No acute intracranial pathology. 2. ASPECTS is 10 These results were paged via AMION at the time of interpretation on 12/10/2021 at 11:42 am to provider Dr Cheral Marker. Electronically Signed   By: Valetta Mole M.D.   On: 12/10/2021 11:45       Current Outpatient Medications (Respiratory):    levocetirizine (XYZAL) 5 MG tablet, TAKE 1 TABLET BY MOUTH DAILY FOR ALLERGIES (Patient taking differently: Take 5 mg by mouth daily.)  Current Outpatient Medications (Analgesics):    Ubrogepant (UBRELVY) 50 MG TABS, Take 50 mg by mouth as needed. (Patient taking differently: Take 50 mg by mouth as needed (migraine).)   aspirin EC 81 MG tablet, Take 1 tablet (81 mg total) by mouth daily. Swallow whole. (Patient not taking: Reported on 12/14/2021)   Current Outpatient Medications (Other):    buPROPion (WELLBUTRIN XL) 150 MG 24 hr tablet, TAKE 1 TABLET BY MOUTH EVERY MORNING FOR MOOD AND ENERGY (Patient taking differently: Take 150 mg by mouth daily.)   escitalopram (LEXAPRO) 20 MG tablet, TAKE 1 TABLET BY MOUTH DAILY FOR MOOD (Patient taking differently: Take 20 mg by mouth daily.)   topiramate (TOPAMAX) 100 MG tablet, Take 1 tablet  at Bedtime  for Migraine Prevention & Weight Loss (Patient taking differently: Take 100 mg by mouth at bedtime.)   Vitamin D, Ergocalciferol, (DRISDOL) 1.25 MG (50000 UNIT) CAPS capsule, TAKE 1 CAPSULE BY MOUTH 3 DAYS A WEEK (Patient taking differently: Take 50,000 Units by mouth. TAKE 1 CAPSULE BY MOUTH 3 DAYS A WEEK)  Past Medical History:  Diagnosis Date   Allergy    Anxiety    Asthma    Benign hematuria  2011   Ottelin   Hyperlipidemia    Migraine    Vitamin D deficiency      Allergies  Allergen Reactions   Shellfish Allergy Anaphylaxis   Shellfish-Derived Products Anaphylaxis   Atorvastatin     Severe nausea   Rosuvastatin     Nausea, GI    ROS: all negative except above.   Physical Exam: Filed Weights   12/14/21 0932  Weight: 171 lb 6.4 oz (77.7 kg)   BP 100/62   Pulse 63   Temp (!) 97.3 F (36.3 C)   Wt 171 lb 6.4 oz (77.7 kg)   SpO2 98%   BMI 30.60 kg/m  General Appearance: Well nourished, difficulty with speech(aphasia), thinks for long periods to form words, emotional and crying Eyes: PERRLA, EOMs, conjunctiva no swelling or erythema Sinuses: No Frontal/maxillary tenderness ENT/Mouth: Ext aud canals clear, TMs without erythema, bulging. No erythema, swelling, or exudate on post pharynx.  Tonsils not swollen or erythematous. Hearing normal.  Neck: Supple, thyroid normal.  Respiratory: Respiratory effort normal, BS equal bilaterally without rales, rhonchi, wheezing or stridor.  Cardio: RRR with no MRGs. Brisk peripheral pulses without edema.  Abdomen: Soft, + BS.  Non tender, no guarding, rebound, hernias, masses. Lymphatics: Non tender without lymphadenopathy.  Musculoskeletal: Right upper and lower extremity weakness 4/5 Skin: Warm, dry without rashes, lesions, ecchymosis.  Neuro: Cranial nerves intact. Decreased muscle one on right. Sensation intact.  Psych: Awake and oriented X 3,Insight and Judgment appropriate.     Magda Bernheim, NP 10:21 AM [name]

## 2021-12-14 ENCOUNTER — Ambulatory Visit (INDEPENDENT_AMBULATORY_CARE_PROVIDER_SITE_OTHER): Payer: BC Managed Care – PPO | Admitting: Nurse Practitioner

## 2021-12-14 ENCOUNTER — Encounter: Payer: Self-pay | Admitting: Nurse Practitioner

## 2021-12-14 VITALS — BP 100/62 | HR 63 | Temp 97.3°F | Wt 171.4 lb

## 2021-12-14 DIAGNOSIS — G459 Transient cerebral ischemic attack, unspecified: Secondary | ICD-10-CM

## 2021-12-14 DIAGNOSIS — E785 Hyperlipidemia, unspecified: Secondary | ICD-10-CM

## 2021-12-14 DIAGNOSIS — G43809 Other migraine, not intractable, without status migrainosus: Secondary | ICD-10-CM | POA: Diagnosis not present

## 2021-12-14 DIAGNOSIS — R4701 Aphasia: Secondary | ICD-10-CM

## 2021-12-14 DIAGNOSIS — R531 Weakness: Secondary | ICD-10-CM

## 2021-12-14 DIAGNOSIS — E669 Obesity, unspecified: Secondary | ICD-10-CM

## 2021-12-14 DIAGNOSIS — F419 Anxiety disorder, unspecified: Secondary | ICD-10-CM

## 2021-12-14 DIAGNOSIS — Z79899 Other long term (current) drug therapy: Secondary | ICD-10-CM

## 2021-12-14 DIAGNOSIS — I959 Hypotension, unspecified: Secondary | ICD-10-CM

## 2021-12-14 NOTE — Patient Instructions (Addendum)
Monitor blood pressure, if consistently running less than 90/60 notify the office   Transient Ischemic Attack A transient ischemic attack (TIA) causes the same symptoms as a stroke, but the symptoms go away quickly. A TIA happens when blood flow to the brain is blocked. Having a TIA means you may be at risk for a stroke. A TIA is a medical emergency. What are the causes? A TIA is caused by a blocked artery in the head or neck. This means the brain does not get the blood supply it needs. A blockage can be caused by: Fatty buildup in an artery in the head or neck. A blood clot. A tear in an artery. Irritation and swelling (inflammation) of an artery. Sometimes the cause is not known. What increases the risk? Certain things may make you more likely to have a TIA. Some of these are things that you can change, such as: Being very overweight. Using products that have nicotine or tobacco. Taking birth control pills. Not being active. Drinking too much alcohol. Using drugs. Health conditions that may increase your risk include: High blood pressure. High cholesterol. Diabetes. Heart disease. A heartbeat that is not regular (atrial fibrillation). Sickle cell disease. Sleep problems (sleep apnea). Long-term diseases that cause irritation and swelling. Problems with blood clotting. Other risk factors include: Being over the age of 27. Being female. Having a family history of stroke. Having had blood clots, stroke, TIA, or heart attack in the past. Having a history of high blood pressure when pregnant (preeclampsia). Very bad headaches (migraines). What are the signs or symptoms? The symptoms of a TIA are like those of a stroke. They can include: Weakness or loss of feeling in your face, arm, or leg. This often happens on one side of your body. Trouble walking. Trouble moving your arms or legs. Trouble talking or understanding what people are saying. Problems with how you see. Feeling  dizzy. Feeling confused. Loss of balance or coordination. Feeling like you may vomit (nausea) or you vomit. Having a very bad headache. If you can, note what time you started to have symptoms. Tell your doctor. How is this treated? The goal of treatment is to lower the risk for a stroke. This may include: Changes to diet and lifestyle, such as getting regular exercise and stopping smoking. Taking medicines to: Thin the blood. Lower blood pressure. Lower cholesterol. Treating other health conditions, such as diabetes. If testing shows that an artery in your brain is narrow, your doctor may recommend a procedure to: Take the blockage out of your artery (carotid endarterectomy). Open or widen an artery in your neck (carotid angioplasty and stenting). Follow these instructions at home: Medicines Take over-the-counter and prescription medicines only as told by your doctor. If you were told to take aspirin or another medicine to thin your blood, take it exactly as told by your doctor. Taking too much of the medicine can cause bleeding. Taking too little of the medicine may not work to treat the problem. Eating and drinking  Eat 5 or more servings of fruits and vegetables each day. Follow instructions from your doctor about your diet. You may need to follow a certain diet to help lower your risk of a stroke. You may need to: Eat a diet that is low in fat and salt. Eat foods with a lot of fiber. Limit carbohydrates and sugar. If you drink alcohol: Limit how much you have to: 0-1 drink a day for women who are not pregnant. 0-2 drinks  a day for men. Know how much alcohol is in a drink. In the [place] one drink equals one 12 oz bottle of beer (382m), one 5 oz glass of wine (1450m, or one 1 oz glass of hard liquor (4429m General instructions Keep a healthy weight. Try to get at least 30 minutes of exercise on most days. Get treatment if you have sleep problems. Do not smoke or use any  products that contain nicotine or tobacco. If you need help quitting, ask your doctor. Do not use drugs. Keep all follow-up visits. Where to find more information American Stroke Association: www.stroke.org Get help right away if: You have chest pain. You have a heartbeat that is not regular. You have any signs of a stroke. "BE FAST" is an easy way to remember the main warning signs: B - Balance. Dizziness, sudden trouble walking, or loss of balance. E - Eyes. Trouble seeing or a change in how you see. F - Face. Sudden weakness or loss of feeling of the face. The face or eyelid may droop on one side. A - Arms. Weakness or loss of feeling in an arm. This happens all of a sudden and most often on one side of the body. S - Speech. Sudden trouble speaking, slurred speech, or trouble understanding what people say. T - Time. Time to call emergency services. Write down what time symptoms started. You have other signs of a stroke, such as: A sudden, very bad headache with no known cause. Feeling like you may vomit. Vomiting. A seizure. These symptoms may be an emergency. Get help right away. Call your local emergency services (911 in the [place] Do not wait to see if the symptoms will go away. Do not drive yourself to the hospital. Summary A transient ischemic attack (TIA) happens when an artery in the head or neck is blocked. This causes the same symptoms as a stroke. The symptoms go away quickly. A TIA is a medical emergency. Get help right away, even if your symptoms go away. Having a TIA means that you may be at risk for a stroke. Taking medicines and making diet and lifestyle changes can help to prevent a stroke. This information is not intended to replace advice given to you by your health care provider. Make sure you discuss any questions you have with your health care provider. Document Revised: 02/04/2020 Document Reviewed: 02/04/2020 Elsevier Patient Education  [address]

## 2021-12-15 ENCOUNTER — Ambulatory Visit: Payer: BC Managed Care – PPO | Admitting: Occupational Therapy

## 2021-12-15 ENCOUNTER — Ambulatory Visit: Payer: BC Managed Care – PPO | Attending: Nurse Practitioner | Admitting: Physical Therapy

## 2021-12-15 ENCOUNTER — Encounter: Payer: Self-pay | Admitting: Occupational Therapy

## 2021-12-15 ENCOUNTER — Encounter: Payer: Self-pay | Admitting: Speech Pathology

## 2021-12-15 ENCOUNTER — Ambulatory Visit: Payer: BC Managed Care – PPO | Admitting: Speech Pathology

## 2021-12-15 DIAGNOSIS — R41840 Attention and concentration deficit: Secondary | ICD-10-CM | POA: Insufficient documentation

## 2021-12-15 DIAGNOSIS — R2681 Unsteadiness on feet: Secondary | ICD-10-CM | POA: Diagnosis present

## 2021-12-15 DIAGNOSIS — R41844 Frontal lobe and executive function deficit: Secondary | ICD-10-CM | POA: Diagnosis present

## 2021-12-15 DIAGNOSIS — R482 Apraxia: Secondary | ICD-10-CM

## 2021-12-15 DIAGNOSIS — R278 Other lack of coordination: Secondary | ICD-10-CM | POA: Diagnosis present

## 2021-12-15 DIAGNOSIS — M6281 Muscle weakness (generalized): Secondary | ICD-10-CM

## 2021-12-15 DIAGNOSIS — R2689 Other abnormalities of gait and mobility: Secondary | ICD-10-CM | POA: Diagnosis present

## 2021-12-15 LAB — COMPLETE METABOLIC PANEL WITH GFR
AG Ratio: 1.9 (calc) (ref 1.0–2.5)
ALT: 11 U/L (ref 6–29)
AST: 10 U/L (ref 10–35)
Albumin: 4.3 g/dL (ref 3.6–5.1)
Alkaline phosphatase (APISO): 104 U/L (ref 37–153)
BUN: 9 mg/dL (ref 7–25)
CO2: 23 mmol/L (ref 20–32)
Calcium: 9.8 mg/dL (ref 8.6–10.4)
Chloride: 107 mmol/L (ref 98–110)
Creat: 0.73 mg/dL (ref 0.50–1.03)
Globulin: 2.3 g/dL (calc) (ref 1.9–3.7)
Glucose, Bld: 86 mg/dL (ref 65–99)
Potassium: 4.5 mmol/L (ref 3.5–5.3)
Sodium: 137 mmol/L (ref 135–146)
Total Bilirubin: 0.4 mg/dL (ref 0.2–1.2)
Total Protein: 6.6 g/dL (ref 6.1–8.1)
eGFR: 96 mL/min/{1.73_m2} (ref >=60)

## 2021-12-15 LAB — CBC WITH DIFFERENTIAL/PLATELET
Absolute Monocytes: 475 cells/uL (ref 200–950)
Basophils Absolute: 59 cells/uL (ref 0–200)
Basophils Relative: 0.9 %
Eosinophils Absolute: 139 cells/uL (ref 15–500)
Eosinophils Relative: 2.1 %
HCT: 44.3 % (ref 35.0–45.0)
Hemoglobin: 15.1 g/dL (ref 11.7–15.5)
Lymphs Abs: 3366 cells/uL (ref 850–3900)
MCH: 29.2 pg (ref 27.0–33.0)
MCHC: 34.1 g/dL (ref 32.0–36.0)
MCV: 85.7 fL (ref 80.0–100.0)
MPV: 9.1 fL (ref 7.5–12.5)
Monocytes Relative: 7.2 %
Neutro Abs: 2561 cells/uL (ref 1500–7800)
Neutrophils Relative %: 38.8 %
Platelets: 277 10*3/uL (ref 140–400)
RBC: 5.17 10*6/uL — ABNORMAL HIGH (ref 3.80–5.10)
RDW: 12.6 % (ref 11.0–15.0)
Total Lymphocyte: 51 %
WBC: 6.6 10*3/uL (ref 3.8–10.8)

## 2021-12-15 LAB — LIPID PANEL
Cholesterol: 386 mg/dL — ABNORMAL HIGH (ref <200)
HDL: 40 mg/dL — ABNORMAL LOW (ref >=50)
LDL Cholesterol (Calc): 309 mg/dL (calc) — ABNORMAL HIGH
Non-HDL Cholesterol (Calc): 346 mg/dL (calc) — ABNORMAL HIGH (ref <130)
Total CHOL/HDL Ratio: 9.7 (calc) — ABNORMAL HIGH (ref <5.0)
Triglycerides: 190 mg/dL — ABNORMAL HIGH (ref <150)

## 2021-12-15 NOTE — Therapy (Signed)
OUTPATIENT SPEECH LANGUAGE PATHOLOGY APHASIA EVALUATION   Patient Name: Kimberly Zhang MRN: [id number] DOB:[birth year], 56 y.o., female Today's Date: 12/15/2021  PCP: Unk Pinto MD REFERRING PROVIDER: Magda Bernheim, NP    End of Session - 12/15/21 1100     Visit Number 1    Number of Visits 17    Date for SLP Re-Evaluation 02/09/22             Past Medical History:  Diagnosis Date   Allergy    Anxiety    Asthma    Benign hematuria 2011   Ottelin   Hyperlipidemia    Migraine    Vitamin D deficiency    Past Surgical History:  Procedure Laterality Date   ABDOMINAL HYSTERECTOMY  1998   COLONOSCOPY  2018   LEEP     POLYPECTOMY     TONSILECTOMY/ADENOIDECTOMY WITH MYRINGOTOMY     Patient Active Problem List   Diagnosis Date Noted   Transient ischemic attack (TIA) 12/10/2021   B12 deficiency 04/21/2021   Obesity (BMI 30.0-34.9) 12/01/2015   Female genuine stress incontinence 10/20/2014   Benign hematuria    Hyperlipidemia    Anxiety    Asthma    Migraine    Vitamin D deficiency     ONSET DATE: 12/10/21   REFERRING DIAG: G45.9 (ICD-10-CM) - Transient cerebral ischemic attack, unspecified   THERAPY DIAG:  Verbal apraxia  Rationale for Evaluation and Treatment Rehabilitation  SUBJECTIVE:   SUBJECTIVE STATEMENT: "My words come out slow" Pt accompanied by: significant other Kimberly Zhang  PERTINENT HISTORY: Kimberly Zhang is a 56 y.o. female with medical history significant of hyperlipidemia, migraines (unspecified subtype), anxiety and depression who presents to the ED from PCP office with acute onset right-sided numbness and reported slurred speech.  Last known normal was Thursday night, she is outside the window for tPA consideration.  Unclear time of onset but symptoms were noted at 6:30 AM, neurology has been consulted recommending inpatient evaluation for TIA given negative CT and MRI at this point.  Hospitalist called for admission   PAIN:  Are you  having pain? No  FALLS: Has patient fallen in last 6 months?  Comment: PT eval today  LIVING ENVIRONMENT: Lives with: lives with their spouse Lives in: House/apartment  PLOF:  Level of assistance: Independent with IADLs Employment: Full-time employment   PATIENT GOALS To talk like normal  OBJECTIVE:    COGNITION: Overall cognitive status: Within functional limits for tasks assessed   AUDITORY COMPREHENSION: Overall auditory comprehension: Appears intact  READING COMPREHENSION: Intact  EXPRESSION: verbal  VERBAL EXPRESSION: Level of generative/spontaneous verbalization: conversation Automatic speech: social response: intact, counting: intact, day of week: intact, and month of year: intact  Repetition: Appears intact Naming: Divergent: 76-100% Pragmatics: Appears intact Comments: No word finding difficulty noted, spouse denied word finding difficulty at home Interfering components:    WRITTEN EXPRESSION: Dominant hand:   rightWritten expression: Not tested  MOTOR SPEECH: Overall motor speech: impaired 100% intelligible at conversation level. Conversation and automatic speech with atypically and inconsistently slowed rate with inconsistent prolongation of initial phoneme. Volume audible. Rate and fluency improved with repetition of non speech syllables 5-10x. 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 Repetition of 2-3 syllable words 5x with improved rate and   ORAL MOTOR EXAMINATION WFL for tasks assessed. Mildly slowed movements, however oral apraxia screen revealed WNL oral movements. She is tolerating regular solids and thin liquids. Denies drool   PATIENT REPORTED OUTCOME MEASURES (PROM): To be completed 1-2 sessions  TODAY'S TREATMENT:  Initiated HEP for verbal apraxia with improved rate and fluency on structured alternating syllable and short word repetition. See pt instructions   PATIENT EDUCATION: Education details: HEP, good prognosis Person educated: Patient  and Spouse Education method: Explanation, Demonstration, Verbal cues, and Handouts Education comprehension: verbalized understanding, returned demonstration, verbal cues required, and needs further education   GOALS: Goals reviewed with patient? Yes  SHORT TERM GOALS: Target date: 01/12/22  Pt will complete HEP for verbal apraxia with mod I Baseline: Goal status: INITIAL  2.  Pt will achieve WFL rate and fluency in structure task 18/20 sentences with occasional min A Baseline:  Goal status: INITIAL  3.  Pt will achieve WFL rate with 3 or less dysfluencies over 8 minute simple conversation with occasional min A over 2 sessions Baseline:  Goal status: INITIAL   LONG TERM GOALS: Target date: 02/09/2022  (Remove Blue Hyperlink)  Pt will achieve WFL rate and fluency over 18 minute complex conversation with rare min A Baseline:  Goal status: INITIAL  2.  Pt will participate in informal Zoom call with functional rate and 2 or less dysfluencies  over 10 minute call Baseline:  Goal status: INITIAL   ASSESSMENT:  CLINICAL IMPRESSION: Patient is a 56 y.o. female who was seen today for concerns re: slowed speech. She is accompanied by her spouse, Kimberly Zhang. Kimberly Zhang denies any word finding episodes at home, Kimberly Zhang agrees. 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 Speech with  atypically slowed rate with inconsistent prolonged initial phonemes.  No groping or halting observed. Rapid alternating speech tasks resulted in improved rate and no dysfluency. She had 3 episodes of WNL rate of speech and no dysfluencies in conversation re: family and spouse. Rate varied throughout evaluation. I recommend skilled ST to maximize normal rate of speech to return to PLOF and improve QOL  OBJECTIVE IMPAIRMENTS include apraxia. These impairments are limiting patient from return to work and effectively communicating at home and in  community. Factors affecting potential to achieve goals and functional outcome are cooperation/participation level. Patient will benefit from skilled SLP services to address above impairments and improve overall function.  REHAB POTENTIAL: Good  PLAN: SLP FREQUENCY: 2x/week  SLP DURATION: 4 weeks  PLANNED INTERVENTIONS: Language facilitation, Environmental controls, Cueing hierachy, Internal/external aids, Functional tasks, Multimodal communication approach, and SLP instruction and feedback    Strong, Annye Rusk, [place] 12/15/2021, 12:51 PM

## 2021-12-15 NOTE — Therapy (Signed)
OUTPATIENT PHYSICAL THERAPY NEURO EVALUATION   Patient Name: Kimberly Zhang MRN: [id number] DOB:02-07-1966, 56 y.o., female Today's Date: 12/15/2021   PCP: Unk Pinto, MD REFERRING PROVIDER: Magda Bernheim, NP    PT End of Session - 12/15/21 [phone]     Visit Number 1    Number of Visits 13   Plus eval   Date for PT Re-Evaluation 03/09/22    Authorization Type [employer]    PT Start Time [phone]    PT Stop Time 1014    PT Time Calculation (min) 40 min    Activity Tolerance Patient tolerated treatment well    Behavior During Therapy [facility] for tasks assessed/performed;Anxious             Past Medical History:  Diagnosis Date   Allergy    Anxiety    Asthma    Benign hematuria 2011   Ottelin   Hyperlipidemia    Migraine    Vitamin D deficiency    Past Surgical History:  Procedure Laterality Date   ABDOMINAL HYSTERECTOMY  1998   COLONOSCOPY  2018   LEEP     POLYPECTOMY     TONSILECTOMY/ADENOIDECTOMY WITH MYRINGOTOMY     Patient Active Problem List   Diagnosis Date Noted   Transient ischemic attack (TIA) 12/10/2021   B12 deficiency 04/21/2021   Obesity (BMI 30.0-34.9) 12/01/2015   Female genuine stress incontinence 10/20/2014   Benign hematuria    Hyperlipidemia    Anxiety    Asthma    Migraine    Vitamin D deficiency     ONSET DATE: 12/14/2021   REFERRING DIAG: G45.9 (ICD-10-CM) - Transient ischemic attack (TIA)   THERAPY DIAG:  Unsteadiness on feet  Other abnormalities of gait and mobility  Rationale for Evaluation and Treatment Rehabilitation  SUBJECTIVE:                                                                                                                                                                                              SUBJECTIVE STATEMENT: Pt very emotional throughout evaluation. Started to have symptoms of CVA on Friday, was determined to be a TIA affecting her R side. States that bending over and taking a shower makes her  dizzy. States her appetite is poor and she is very tired. Reports she feels like she veers to the R during gait, but has improved since Friday. "I am dizzy when I wake up".    Pt accompanied by: significant other Deidre Ala)   PERTINENT HISTORY: hyperlipidemia, migraines (unspecified subtype), anxiety and depression   PAIN:  Are you having pain? No  PRECAUTIONS: Fall  WEIGHT BEARING RESTRICTIONS No  FALLS: Has patient fallen in last 6 months? No  LIVING ENVIRONMENT: Lives with: lives with their spouse and lives with their daughter Lives in: House/apartment Stairs: Yes: Internal: 16 steps; on right going up and External: 1 steps; none Has following equipment at home: None  PLOF: Independent  PATIENT GOALS "Bending and standing up without feeling off balance" "feeling security when I am walking"   OBJECTIVE:   COGNITION: Overall cognitive status:  Difficult to assess - pt emotionally labile throughout session   SENSATION: Reports numbness and tingling of R hemibody that has improved since onset of TIA   COORDINATION: Heel to shin test: WFL on LLE, dysmetria noted on RLE   POSTURE: rounded shoulders, forward head, and posterior pelvic tilt  LOWER EXTREMITY MMT:    MMT Right Eval Left Eval  Hip flexion 3+ 4+  Hip extension    Hip abduction 4 4+  Hip adduction 3+ 4  Hip internal rotation    Hip external rotation    Knee flexion 4- 4+  Knee extension 4- 4+  Ankle dorsiflexion 4- 4+  Ankle plantarflexion    Ankle inversion    Ankle eversion    (Blank rows = not tested)  BED MOBILITY:  Pt reports greatest limitation is dizziness, no physical impairment  TRANSFERS: Assistive device utilized: None  Sit to stand: CGA Stand to sit: CGA   RAMP:  Level of Assistance: {Levels of assistance:24026} Assistive device utilized: {Assistive devices:23999} Ramp Comments: ***  CURB:  Level of Assistance: {Levels of assistance:24026} Assistive device utilized: {Assistive  devices:23999} Curb Comments: ***  STAIRS:  Level of Assistance: {Levels of assistance:24026}  Stair Negotiation Technique: {Stair Technique:27161} with {Rail Assistance:27162}  Number of Stairs: ***   Height of Stairs: ***  Comments: ***  GAIT: Gait pattern: {gait characteristics:25376} Distance walked: *** Assistive device utilized: {Assistive devices:23999} Level of assistance: {Levels of assistance:24026} Comments: ***  FUNCTIONAL TESTs:  {Functional tests:24029}  PATIENT SURVEYS:  {rehab surveys:24030}  TODAY'S TREATMENT:  See below    PATIENT EDUCATION: Education details: *** Person educated: {Person educated:25204} Education method: {Education Method:25205} Education comprehension: {Education Comprehension:25206}   HOME EXERCISE PROGRAM: ***    GOALS: Goals reviewed with patient? {yes/no:20286}  SHORT TERM GOALS: Target date: {follow up:25551}  *** Baseline: Goal status: {GOALSTATUS:25110}  2.  *** Baseline:  Goal status: {GOALSTATUS:25110}  3.  *** Baseline:  Goal status: {GOALSTATUS:25110}  4.  *** Baseline:  Goal status: {GOALSTATUS:25110}  5.  *** Baseline:  Goal status: {GOALSTATUS:25110}  6.  *** Baseline:  Goal status: {GOALSTATUS:25110}  LONG TERM GOALS: Target date: {follow up:25551}  *** Baseline:  Goal status: {GOALSTATUS:25110}  2.  *** Baseline:  Goal status: {GOALSTATUS:25110}  3.  *** Baseline:  Goal status: {GOALSTATUS:25110}  4.  *** Baseline:  Goal status: {GOALSTATUS:25110}  5.  *** Baseline:  Goal status: {GOALSTATUS:25110}  6.  *** Baseline:  Goal status: {GOALSTATUS:25110}  ASSESSMENT:  CLINICAL IMPRESSION: Patient is a 56 year old female referred to Neuro OPPT for TIA. Pt's PMH is significant for: hyperlipidemia, migraines (unspecified subtype), anxiety and depression. The following deficits were present during the exam: ***. Based on ***, pt is an incr risk for falls. Pt would benefit from  skilled PT to address these impairments and functional limitations to maximize functional mobility independence    OBJECTIVE IMPAIRMENTS {opptimpairments:25111}.   ACTIVITY LIMITATIONS {activitylimitations:27494}  PARTICIPATION LIMITATIONS: {participationrestrictions:25113}  PERSONAL FACTORS {Personal factors:25162} are also affecting patient's functional outcome.   REHAB POTENTIAL: {  rehabpotential:25112}  CLINICAL DECISION MAKING: {clinical decision making:25114}  EVALUATION COMPLEXITY: {Evaluation complexity:25115}  PLAN: PT FREQUENCY: {rehab frequency:25116}  PT DURATION: {rehab duration:25117}  PLANNED INTERVENTIONS: {rehab planned interventions:25118::"Therapeutic exercises","Therapeutic activity","Neuromuscular re-education","Balance training","Gait training","Patient/Family education","Joint mobilization"}  PLAN FOR NEXT SESSION: ***   Tria Noguera E Zaylia Riolo, PT, DPT 12/15/2021, 10:16 AM

## 2021-12-15 NOTE — Patient Instructions (Signed)
   3 ring binder for therapies  Try to get on a nap schedule to an hour a day then try to stay up until 8-9  Quiet in the bedroom - no TV during sleep or bluetooth headphones. Consider a sleep mask to block out noise if it doesn't bug   Speech exercises - do 10x each, x2-3/day as fast as you can while being accurate  Pa Ta Ka PaTa TaKa KaPa PaTaKa   [employer] Tip Top Fifty [employer] [place] Scientist, clinical (histocompatibility and immunogenetics)   SAY THE FOLLOWING-  5x each slowly &make every sound! Red leather, yellow leather     Purple baby carriage    [facility] [place] copper coffee pot Ripe purple cabbage Three free throws [place] [employer] [employer], Blue Bulb Flash Message Six Thick Thistles Stick Double Bubble Gum Cinnamon aluminum linoleum Black bugs blood Lovely lemon linament Tying Tape Takes Time A Shifty Salt Shaker   [place] A Three Toed Tree Toad Knapsack Strap Snap Rubber Baby Buggy Bumpers [place]

## 2021-12-15 NOTE — Therapy (Signed)
OUTPATIENT OCCUPATIONAL THERAPY NEURO EVALUATION  Patient Name: Kimberly Zhang MRN: [id number] DOB:[birth year], 56 y.o., female Today's Date: 12/15/2021  PCP: Unk Pinto, MD REFERRING PROVIDER: Magda Bernheim, NP   OT End of Session - 12/15/21 1156     Visit Number 1    Number of Visits 9    Date for OT Re-Evaluation 01/26/22    Authorization Type BCBS    Authorization Time Period VL:MN    OT Start Time 1100    OT Stop Time 2637    OT Time Calculation (min) 45 min    Activity Tolerance Patient tolerated treatment well    Behavior During Therapy [facility] for tasks assessed/performed             Past Medical History:  Diagnosis Date   Allergy    Anxiety    Asthma    Benign hematuria 2011   Ottelin   Hyperlipidemia    Migraine    Vitamin D deficiency    Past Surgical History:  Procedure Laterality Date   ABDOMINAL HYSTERECTOMY  1998   COLONOSCOPY  2018   LEEP     POLYPECTOMY     TONSILECTOMY/ADENOIDECTOMY WITH MYRINGOTOMY     Patient Active Problem List   Diagnosis Date Noted   Transient ischemic attack (TIA) 12/10/2021   B12 deficiency 04/21/2021   Obesity (BMI 30.0-34.9) 12/01/2015   Female genuine stress incontinence 10/20/2014   Benign hematuria    Hyperlipidemia    Anxiety    Asthma    Migraine    Vitamin D deficiency      Rationale for Evaluation and Treatment Rehabilitation    ONSET DATE: 12/10/21  REFERRING DIAG: G45.9 (ICD-10-CM) - Transient cerebral ischemic attack, unspecified  THERAPY DIAG:  Unsteadiness on feet  Other lack of coordination  Muscle weakness (generalized)  Apraxia  Attention and concentration deficit  Frontal lobe and executive function deficit  SUBJECTIVE:   SUBJECTIVE STATEMENT: Pt reports having TIA with weakness on the right side.   Pt accompanied by: self and significant other, Ralph  PERTINENT HISTORY: hyperlipidemia, migraines (unspecified subtype), anxiety and depression   PRECAUTIONS:  Fall  WEIGHT BEARING RESTRICTIONS No  PAIN:  Are you having pain? No  FALLS: Has patient fallen in last 6 months? No  LIVING ENVIRONMENT: Lives with: lives with their spouse and lives with their daughter Lives in: House/apartment Stairs:  internal flight of steps Has following equipment at home: None  PLOF: Vocation/Vocational requirements: Full time - [employer] children [employer], board for running [employer] in [place], Leisure: play with grandchildren, and Independent PLOF  PATIENT GOALS "move like I normally would" re: RUE  OBJECTIVE:    DIAGNOSTIC FINDINGS:  MRI 12/10/21 IMPRESSION: No acute infarction, hemorrhage, or mass.   Probable minor chronic microvascular ischemic changes.  HAND DOMINANCE: Right  ADLs: Overall ADLs: Mod I level for all ADLs Eating: Mod I Grooming: Mod I UB Dressing: Mod  I LB Dressing: Mod I Toileting: Mod I Bathing: Mod I Tub Shower transfers: Mod I Equipment: none   IADLs: Shopping: has not gone Light housekeeping: increased time Meal Prep: Obtaining snacks and simple items. Needs help opening containers. Community mobility: Not currently driving Medication management: independent Financial management: hasn't had to do it yet Handwriting: 75% legible per pt report of PLOF  MOBILITY STATUS: Needs Assist: hand held assistance - not using device  POSTURE COMMENTS:  rounded shoulders, forward head, and posterior pelvic tilt   ACTIVITY TOLERANCE: Activity tolerance: poor  UE ROM     Active ROM Right 11/22/2021 Left 11/22/2021  Shoulder flexion [facility] however slower movements on RUE  Shoulder abduction   Shoulder adduction   Shoulder extension   Shoulder internal rotation   Shoulder external rotation   Elbow flexion   Elbow extension   Wrist flexion   Wrist extension   Wrist ulnar deviation   Wrist radial deviation   Wrist pronation   Wrist supination   (Blank rows = not tested)   UE MMT:     MMT Right 11/22/2021  Left 11/22/2021  Shoulder flexion Grossly 4/5 on RUE [facility]  Shoulder abduction    Shoulder adduction    Shoulder extension    Shoulder internal rotation    Shoulder external rotation    Middle trapezius    Lower trapezius    Elbow flexion    Elbow extension    Wrist flexion    Wrist extension    Wrist ulnar deviation    Wrist radial deviation    Wrist pronation    Wrist supination    (Blank rows = not tested)  HAND FUNCTION: Grip strength: Right: 18.5 lbs; Left: 40.3 lbs  COORDINATION: Finger Nose Finger test: impaired on RUE - slow but accurate 9 Hole Peg test: Right: 24.84 sec; Left: 22 sec Box and Blocks:  Right 48 blocks, Left 54 blocks  SENSATION: WFL Light touch: WFL Hot/Cold: WFL Reports numbness and "pins and needles" in RUE  EDEMA: N/A   COGNITION: Overall cognitive status: Difficulty to assess - pt denies any changes with memory or problem solving however reports difficult with understanding and thinking about what people say to her and responding. "I have to really think about it before I respond"  VISION: Subjective report: Pt denies any changes with vision.  PERCEPTION: WFL  PRAXIS: Impaired: decreased speed/processing  TODAY'S TREATMENT:  12/15/21 None today - evaluation only.   PATIENT EDUCATION: Education details: Education on role and purpose of OT  Person educated: Patient Education method: Explanation Education comprehension: verbalized understanding   HOME EXERCISE PROGRAM: To Be Established    GOALS: Goals reviewed with patient? No   LONG TERM GOALS: Target date: 01/26/2022  Pt will be independent with HEP targeting RUE proximal strength and grip strength  Baseline:  Goal status: INITIAL  2.  Pt will improve grip strength by at least 5 lbs with RUE for increasing functional use in dominant hand  Baseline: Right: 18.5 lbs; Left: 40.3 lbs Goal status: INITIAL  3.  Pt will verbalize understanding of adapted strategies and/or  equipment PRN to increase safety and independence with ADLs and IADLs (I.e. handwriting and functional activities)  Baseline:  Goal status: INITIAL   ASSESSMENT:  CLINICAL IMPRESSION: Patient is a 56 y.o. female who was seen today for occupational therapy evaluation for TIA. Pt presents with deficits with RUE slower movements, speech deficits and grip and proximal strength deficits. Skilled occupational therapy is recommended to target listed areas of deficit and increase independence with ADLs and IADLs and decrease caregiver burden.     PERFORMANCE DEFICITS in functional skills including ADLs, IADLs, coordination, strength, GMC, decreased knowledge of use of DME, and UE functional use, cognitive skills including problem solving and temperament/personality, and psychosocial skills including coping strategies and routines and behaviors.   IMPAIRMENTS are limiting patient from ADLs, IADLs, work, and leisure.   COMORBIDITIES has no other co-morbidities that affects occupational performance. Patient will benefit from skilled OT to address above impairments and improve overall function.  MODIFICATION OR ASSISTANCE TO COMPLETE EVALUATION: No modification of tasks or assist necessary to complete an evaluation.  OT OCCUPATIONAL PROFILE AND HISTORY: Problem focused assessment: Including review of records relating to presenting problem.  CLINICAL DECISION MAKING: LOW - limited treatment options, no task modification necessary  REHAB POTENTIAL: Good  EVALUATION COMPLEXITY: Low    PLAN: OT FREQUENCY: 2x/week  OT DURATION: 4 weeks  PLANNED INTERVENTIONS: self care/ADL training, therapeutic exercise, therapeutic activity, neuromuscular re-education, manual therapy, functional mobility training, patient/family education, cognitive remediation/compensation, and DME and/or AE instructions  RECOMMENDED OTHER SERVICES: pt seen by PT and ST today  CONSULTED AND AGREED WITH PLAN OF CARE: Patient and  family member/caregiver  PLAN FOR NEXT SESSION: issue theraputty HEP, proximal strengthening for [place], OT 12/15/2021, 12:19 PM

## 2021-12-17 ENCOUNTER — Other Ambulatory Visit: Payer: Self-pay | Admitting: Adult Health Nurse Practitioner

## 2021-12-17 DIAGNOSIS — G43701 Chronic migraine without aura, not intractable, with status migrainosus: Secondary | ICD-10-CM

## 2021-12-23 ENCOUNTER — Ambulatory Visit: Payer: BC Managed Care – PPO | Admitting: Physical Therapy

## 2021-12-23 ENCOUNTER — Ambulatory Visit: Payer: BC Managed Care – PPO | Attending: Nurse Practitioner | Admitting: Occupational Therapy

## 2021-12-23 ENCOUNTER — Ambulatory Visit: Payer: BC Managed Care – PPO

## 2021-12-23 ENCOUNTER — Encounter: Payer: Self-pay | Admitting: Physical Therapy

## 2021-12-23 DIAGNOSIS — R2689 Other abnormalities of gait and mobility: Secondary | ICD-10-CM

## 2021-12-23 DIAGNOSIS — M6281 Muscle weakness (generalized): Secondary | ICD-10-CM | POA: Insufficient documentation

## 2021-12-23 DIAGNOSIS — R2681 Unsteadiness on feet: Secondary | ICD-10-CM | POA: Diagnosis present

## 2021-12-23 DIAGNOSIS — R41844 Frontal lobe and executive function deficit: Secondary | ICD-10-CM | POA: Diagnosis present

## 2021-12-23 DIAGNOSIS — R482 Apraxia: Secondary | ICD-10-CM | POA: Diagnosis present

## 2021-12-23 DIAGNOSIS — R278 Other lack of coordination: Secondary | ICD-10-CM | POA: Insufficient documentation

## 2021-12-23 DIAGNOSIS — R41840 Attention and concentration deficit: Secondary | ICD-10-CM | POA: Diagnosis present

## 2021-12-23 NOTE — Therapy (Signed)
OUTPATIENT SPEECH LANGUAGE PATHOLOGY TREATMENT NOTE   Patient Name: Kimberly Zhang MRN: [id number] DOB:1966-06-27, 56 y.o., female Today's Date: 12/23/2021  PCP: Unk Pinto MD REFERRING PROVIDER: Magda Bernheim, NP   END OF SESSION:   End of Session - 12/23/21 1012     Visit Number 2    Number of Visits 17    Date for SLP Re-Evaluation 02/09/22    Authorization Type BCBS    SLP Start Time 4650    SLP Stop Time  3546    SLP Time Calculation (min) 38 min    Activity Tolerance Patient limited by lethargy;Patient limited by fatigue             Past Medical History:  Diagnosis Date   Allergy    Anxiety    Asthma    Benign hematuria 2011   Ottelin   Hyperlipidemia    Migraine    Vitamin D deficiency    Past Surgical History:  Procedure Laterality Date   ABDOMINAL HYSTERECTOMY  1998   COLONOSCOPY  2018   LEEP     POLYPECTOMY     TONSILECTOMY/ADENOIDECTOMY WITH MYRINGOTOMY     Patient Active Problem List   Diagnosis Date Noted   Transient ischemic attack (TIA) 12/10/2021   B12 deficiency 04/21/2021   Obesity (BMI 30.0-34.9) 12/01/2015   Female genuine stress incontinence 10/20/2014   Benign hematuria    Hyperlipidemia    Anxiety    Asthma    Migraine    Vitamin D deficiency     ONSET DATE: 12/10/21   REFERRING DIAG: G45.9 (ICD-10-CM) - Transient cerebral ischemic attack, unspecified   THERAPY DIAG: Verbal apraxia  Rationale for Evaluation and Treatment Rehabilitation  SUBJECTIVE: "It's getting better"  PAIN:  Are you having pain? No  OBJECTIVE:   TODAY'S TREATMENT:  12-23-21: Reportedly and notably fatigued today. Grossly WNL verbal fluency exhibited in opening conversation with occasional pausing. Completed HEP targeting verbal apraxia. Oral reading at paragraph level was notably slow and effortful. Scaffolded task for reading at sentence level with repetition to target increasing fluency with mass practice. Pt able to improve fluency of  speech after 2-3 repetitions. SLP targeted naming task with usual pausing and delays noted. Usual initial phoneme prolongation exhibited at word level, which mildly improved with SLP cues and demonstration. Conversational speech c/p reduced volume and occasional reduced rate. Fatigue appeared to greatly impact performance today. Updated HEP.  12-15-21: Initiated HEP for verbal apraxia with improved rate and fluency on structured alternating syllable and short word repetition. See pt instructions     PATIENT EDUCATION: Education details: HEP, good prognosis Person educated: Patient and Spouse Education method: Explanation, Demonstration, Verbal cues, and Handouts Education comprehension: verbalized understanding, returned demonstration, verbal cues required, and needs further education     GOALS: Goals reviewed with patient? Yes   SHORT TERM GOALS: Target date: 01/12/22   Pt will complete HEP for verbal apraxia with mod I Baseline: Goal status: ongoing   2.  Pt will achieve WFL rate and fluency in structure task 18/20 sentences with occasional min A Baseline:  Goal status: ongoing   3.  Pt will achieve WFL rate with 3 or less dysfluencies over 8 minute simple conversation with occasional min A over 2 sessions Baseline:  Goal status: ongoing     LONG TERM GOALS: Target date: 02/09/2022     Pt will achieve WFL rate and fluency over 18 minute complex conversation with rare min A Baseline:  Goal status: ongoing  2.  Pt will participate in informal Zoom call with functional rate and 2 or less dysfluencies  over 10 minute call Baseline:  Goal status: ongoing     ASSESSMENT:   CLINICAL IMPRESSION: Patient is a 56 y.o. female who was seen today for concerns re: slowed speech. 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 Speech with atypically slowed rate with inconsistent prolonged initial phonemes. Rate appeared  significantly impacted by overt fatigue and lethargy. Continued education and training of verbal apraxia strategies to aid speech fluency. I recommend skilled ST to maximize normal rate of speech to return to PLOF and improve QOL   OBJECTIVE IMPAIRMENTS include apraxia. These impairments are limiting patient from return to work and effectively communicating at home and in community. Factors affecting potential to achieve goals and functional outcome are cooperation/participation level. Patient will benefit from skilled SLP services to address above impairments and improve overall function.   REHAB POTENTIAL: Good   PLAN: SLP FREQUENCY: 2x/week   SLP DURATION: 4 weeks   PLANNED INTERVENTIONS: Language facilitation, Environmental controls, Cueing hierachy, Internal/external aids, Functional tasks, Multimodal communication approach, and SLP instruction and feedback     Marzetta Board, CCC-SLP 12/23/2021, 10:52 AM

## 2021-12-23 NOTE — Patient Instructions (Signed)
SELF ASSISTED WITH OBJECT: Shoulder Flexion - Supine    Hold 2 lb dumbbell with both hands (palms facing). Raise arms overhead, keep elbows straight. _10__ reps per set, _2__ sets per day  2. Hold 2 lb dumbbell up at 90 degrees (towards ceiling), thumb facing forward, make small circles clockwise and counterclockwise x 5 reps each way, 2 sets per day  3. Scapular Retraction (Prone)    Lie with arms at sides. Pinch shoulder blades together and down. Shoulders should come up towards ceiling, NOT ears. Repeat _10___ times per set. Do _2___ sessions per day.   4. Shoulder Push-Up (Prone on Elbows)    With elbows placed under shoulders, rise up on elbows as high as possible. Keep hips on surface and back arched. Hold __5__ seconds. Repeat _5-10___ times. Do __2__ sessions per day.  5. Extension - Prone (Dumbbell)    Lie with right arm hanging off side of bed. Lift hand back and up. Hold 3 sec Repeat __10__ times per set. Do __1__ sets per session. Do __2__ sessions per day.   1. Grip Strengthening (Resistive Putty)   Squeeze putty using thumb and all fingers. Repeat _20___ times. Do __2__ sessions per day.   2. Roll putty into tube on table and pinch between first two fingers and thumb x 10 reps. Do 2 sessions per day

## 2021-12-23 NOTE — Therapy (Signed)
OUTPATIENT PHYSICAL THERAPY TREATMENT NOTE   Patient Name: Kimberly Zhang MRN: [id number] DOB:1965-08-28, 56 y.o., female Today's Date: 12/23/2021  PCP: Unk Pinto, MD REFERRING PROVIDER: Magda Bernheim, NP   END OF SESSION:   PT End of Session - 12/23/21 0852     Visit Number 2    Number of Visits 13   Plus eval   Date for PT Re-Evaluation 03/09/22    Authorization Type BCBS    PT Start Time 0849    PT Stop Time 0933    PT Time Calculation (min) 44 min    Equipment Utilized During Treatment Gait belt    Activity Tolerance Patient tolerated treatment well    Behavior During Therapy [facility] for tasks assessed/performed;Anxious             Past Medical History:  Diagnosis Date   Allergy    Anxiety    Asthma    Benign hematuria 2011   Ottelin   Hyperlipidemia    Migraine    Vitamin D deficiency    Past Surgical History:  Procedure Laterality Date   ABDOMINAL HYSTERECTOMY  1998   COLONOSCOPY  2018   LEEP     POLYPECTOMY     TONSILECTOMY/ADENOIDECTOMY WITH MYRINGOTOMY     Patient Active Problem List   Diagnosis Date Noted   Transient ischemic attack (TIA) 12/10/2021   B12 deficiency 04/21/2021   Obesity (BMI 30.0-34.9) 12/01/2015   Female genuine stress incontinence 10/20/2014   Benign hematuria    Hyperlipidemia    Anxiety    Asthma    Migraine    Vitamin D deficiency     REFERRING DIAG: G45.9 (ICD-10-CM) - Transient ischemic attack (TIA)  THERAPY DIAG:  Unsteadiness on feet   Other abnormalities of gait and mobility   Rationale for Evaluation and Treatment Rehabilitation  PERTINENT HISTORY: hyperlipidemia, migraines (unspecified subtype), anxiety and depression   PRECAUTIONS: Fall  SUBJECTIVE: No falls, she has been walking and getting in the shower.  She did this today, but she stepped in sideways holding onto the wall.  She still can't squat or bend at her hips putting her head down because it makes her really dizzy.  Standing back up from  bending over makes her really dizzy.  She has practiced going up and down the stairs.  She takes her time so has not had an issue with that so far.  PAIN:  Are you having pain? No   OBJECTIVE: (objective measures completed at initial evaluation unless otherwise dated)  TODAY'S TREATMENT:  Assessed BERG:  [facility] PT Assessment - 12/23/21 0903       Balance   Balance Assessed Yes      Standardized Balance Assessment   Standardized Balance Assessment Berg Balance Test      Berg Balance Test   Sit to Stand Able to stand without using hands and stabilize independently    Standing Unsupported Able to stand safely 2 minutes    Sitting with Back Unsupported but Feet Supported on Floor or Stool Able to sit safely and securely 2 minutes    Stand to Sit Sits safely with minimal use of hands    Transfers Able to transfer safely, minor use of hands    Standing Unsupported with Eyes Closed Able to stand 10 seconds safely    Standing Unsupported with Feet Together Able to place feet together independently and stand 1 minute safely    From Standing, Reach Forward with Outstretched Arm Reaches forward but  needs supervision   pt visibly wobbly reporting increased dizziness as head goes forward   From Standing Position, Pick up Object from [place] to pick up shoe, needs supervision   Pt unable to face object on floor due to dizziness, can retrieve object when keeping neck extended so head is above waist.   From Standing Position, Turn to Look Behind Over each Shoulder Looks behind from both sides and weight shifts well    Turn 360 Degrees Able to turn 360 degrees safely but slowly   turning to left increased dizziness.   Standing Unsupported, Alternately Place Feet on Step/Stool Able to stand independently and complete 8 steps >20 seconds    Standing Unsupported, One Foot in Front Able to take small step independently and hold 30 seconds    Standing on One Leg Tries to lift leg/unable to hold 3 seconds  but remains standing independently   can hold each leg about 2 seconds   Total Score 44    Berg comment: Significant Fall Risk           Pt becomes emotional during BERG requiring extended seated rest break following several bouts of reported dizziness. Assessed 2MWT:  228'  GAIT: Gait pattern: decreased arm swing- Right, decreased arm swing- Left, decreased step length- Right, decreased step length- Left, decreased stance time- Right, decreased hip/knee flexion- Right, decreased ankle dorsiflexion- Right, and wide BOS Distance walked: 335' Assistive device utilized: None Level of assistance: SBA Comments: Pt demonstrates stiffness of trunk and neck to prevent sudden head movements during gait.  She requires intermittent tactile cues to prevent running into left oriented objects as stiffness prevents full scanning of environment.  Initiated HEP.  See below for details.   PATIENT EDUCATION: Education details: Initial HEP with setup for safety and modifications for tolerance/dizziness.  Discussed pt turning lights on in home when walking around for both safety and to promote tolerance to brighter environments in community as she states she keeps home dim to prevent worsening her dizziness. Person educated: Patient and Spouse Education method: Customer service manager Education comprehension: verbalized understanding and needs further education     HOME EXERCISE PROGRAM: Access Code: [email or web address] URL: [email or web address] Date: 12/23/2021 Prepared by: Elease Etienne  Exercises - Standing March with Counter Support  - 1 x daily - 7 x weekly - 3 sets - 10 reps - Standing Single Leg Stance with Counter Support  - 1 x daily - 7 x weekly - 3 sets - 10 reps -instructed for within functional ROM on RLE knee flexion - Side Stepping with Counter Support  - 1 x daily - 7 x weekly - 3 sets - 10 reps  - Heel Toe Raises with Counter Support  - 1 x daily - 7 x weekly - 3 sets -  10 reps -instructed for within functional ROM on RLE DF       GOALS: Goals reviewed with patient? Yes   SHORT TERM GOALS: Target date: 01/12/2022   Pt will be independent with initial HEP for improved strength, balance, transfers and gait. Baseline: Established 12/23/2021 Goal status: INITIAL   2.  Pt will improve 5 x STS to less than or equal to 25 seconds without UE support to demonstrate improved functional strength and transfer efficiency.  Baseline: 35.21s without UE support  Goal status: INITIAL   3.  Pt will increase BERG balance score to 48/56 to demonstrate improved static balance. Baseline: 44/56 Goal status: INITIAL   4.  Pt will  ambulate>350 feet on 2MWT to demonstrate improved endurance for functional tasks in home and community. Baseline: 228' Goal status: INITIAL     LONG TERM GOALS: Target date: 02/09/2022   Pt will be independent with final HEP for improved strength, balance, transfers and gait. Baseline: To be established Goal status: INITIAL   2.  Pt will improve gait velocity to at least 3.1 ft/s without AD for improved gait efficiency  Baseline: 2.5 ft/s without AD Goal status: INITIAL   3.  Pt will increase BERG balance score to 52/56 to demonstrate improved static balance. Baseline: See STG. Goal status: INITIAL   4.  Pt will ambulate>500 feet on 2MWT to demonstrate improved endurance for functional tasks in home and community. Baseline: See STG. Goal status: INITIAL   5.  Pt will improve 5 x STS to less than or equal to 20 seconds without UE support to demonstrate improved functional strength and transfer efficiency.  Baseline: 35.21s without UE support  Goal status: INITIAL     ASSESSMENT:   CLINICAL IMPRESSION: Assessed BERG this session with pt scoring 44/56 indicating significant fall risk.  She has several bouts of dizziness throughout session requiring rest during which she becomes mildly labile.  She is able to ambulate 228' supervision  level during the 2MWT and completes 335' total this session.  She is avoiding head movement particularly vertically and bright lighting so as to not exacerbate her dizziness.  She would benefit from further assessment of nystagmus and motion sensitivity training in addition to dynamic balance and RLE strengthening in the future.  Will continue per POC.     OBJECTIVE IMPAIRMENTS Abnormal gait, decreased activity tolerance, decreased balance, decreased endurance, decreased knowledge of use of DME, decreased mobility, difficulty walking, decreased strength, dizziness, and impaired sensation.    ACTIVITY LIMITATIONS carrying, lifting, bending, squatting, stairs, transfers, bed mobility, and locomotion level   PARTICIPATION LIMITATIONS: meal prep, cleaning, laundry, interpersonal relationship, driving, shopping, community activity, and occupation   [place] and 1 comorbidity: dizziness  are also affecting patient's functional outcome.    REHAB POTENTIAL: Good   CLINICAL DECISION MAKING: Stable/uncomplicated   EVALUATION COMPLEXITY: Low   PLAN: PT FREQUENCY:  2x/wk for 4 weeks and 1x/wk for 4 weeks   PT DURATION: 12 weeks   PLANNED INTERVENTIONS: Therapeutic exercises, Therapeutic activity, Neuromuscular re-education, Balance training, Gait training, Patient/Family education, Stair training, and DME instructions, vestibular training, canalith repositioning.   PLAN FOR NEXT SESSION: RLE strengthening and balance HEP prn, may benefit from further vestibular assessment (resent cert w/ inclusion of canalith repositioning and vestibular training on 12/23/2021).    Bary Richard, PT, DPT 12/23/2021, 10:18 AM

## 2021-12-23 NOTE — Therapy (Signed)
OUTPATIENT OCCUPATIONAL THERAPY NEURO TREATMENT  Patient Name: Kimberly Zhang MRN: [id number] DOB:1966-02-03, 56 y.o., female Today's Date: 12/23/2021  PCP: Unk Pinto, MD REFERRING PROVIDER: Magda Bernheim, NP   OT End of Session - 12/23/21 0934     Visit Number 2    Number of Visits 9    Date for OT Re-Evaluation 01/26/22    Authorization Type BCBS    Authorization Time Period VL:MN    OT Start Time 0930    OT Stop Time 1015    OT Time Calculation (min) 45 min    Activity Tolerance Patient tolerated treatment well    Behavior During Therapy [facility] for tasks assessed/performed             Past Medical History:  Diagnosis Date   Allergy    Anxiety    Asthma    Benign hematuria 2011   Ottelin   Hyperlipidemia    Migraine    Vitamin D deficiency    Past Surgical History:  Procedure Laterality Date   ABDOMINAL HYSTERECTOMY  1998   COLONOSCOPY  2018   LEEP     POLYPECTOMY     TONSILECTOMY/ADENOIDECTOMY WITH MYRINGOTOMY     Patient Active Problem List   Diagnosis Date Noted   Transient ischemic attack (TIA) 12/10/2021   B12 deficiency 04/21/2021   Obesity (BMI 30.0-34.9) 12/01/2015   Female genuine stress incontinence 10/20/2014   Benign hematuria    Hyperlipidemia    Anxiety    Asthma    Migraine    Vitamin D deficiency      Rationale for Evaluation and Treatment Rehabilitation    ONSET DATE: 12/10/21  REFERRING DIAG: G45.9 (ICD-10-CM) - Transient cerebral ischemic attack, unspecified  THERAPY DIAG:  Muscle weakness (generalized)  Other lack of coordination  SUBJECTIVE:   SUBJECTIVE STATEMENT: Denies pain   PERTINENT HISTORY: hyperlipidemia, migraines (unspecified subtype), anxiety and depression   PRECAUTIONS: Fall   PAIN:  Are you having pain? No  PLOF: Vocation/Vocational requirements: Full time - [employer], board for running [employer] in [place], Leisure: play with grandchildren, and Independent  PLOF  PATIENT GOALS "move like I normally would" re: RUE  OBJECTIVE:    DIAGNOSTIC FINDINGS:  MRI 12/10/21 IMPRESSION: No acute infarction, hemorrhage, or mass.   Probable minor chronic microvascular ischemic changes.  HAND DOMINANCE: Right   TODAY'S TREATMENT:  Pt issued HEP for shoulder girdle strengthening and putty HEP - Red resistance putty issued. See pt instructions for details.   Practiced handwriting - pt 100% legible however appears smaller. Focused on writing bigger  PATIENT EDUCATION: Education details: Shoulder and putty HEP  Person educated: Patient Education method: Explanation, Demonstration, Verbal cues, and Handouts Education comprehension: verbalized understanding, returned demonstration, and verbal cues required   HOME EXERCISE PROGRAM: 12/23/21: Shoulder proximal strengthening HEP, Putty HEP     GOALS: Goals reviewed with patient? No   LONG TERM GOALS: Target date: 02/03/2022  Pt will be independent with HEP targeting RUE proximal strength and grip strength  Baseline:  Goal status: IN PROGRESS   2.  Pt will improve grip strength by at least 5 lbs with RUE for increasing functional use in dominant hand  Baseline: Right: 18.5 lbs; Left: 40.3 lbs Goal status: IN PROGRESS  3.  Pt will verbalize understanding of adapted strategies and/or equipment PRN to increase safety and independence with ADLs and IADLs (I.e. handwriting and functional activities)  Baseline:  Goal status: INITIAL   ASSESSMENT:  CLINICAL IMPRESSION: Patient approximating LTG #1. Pt w/ mild scapula weakness and posterior sh girdle weakness Rt side.     PERFORMANCE DEFICITS in functional skills including ADLs, IADLs, coordination, strength, GMC, decreased knowledge of use of DME, and UE functional use, cognitive skills including problem solving and temperament/personality, and psychosocial skills including coping strategies and routines and behaviors.   IMPAIRMENTS are limiting  patient from ADLs, IADLs, work, and leisure.   COMORBIDITIES has no other co-morbidities that affects occupational performance. Patient will benefit from skilled OT to address above impairments and improve overall function.  MODIFICATION OR ASSISTANCE TO COMPLETE EVALUATION: No modification of tasks or assist necessary to complete an evaluation.  OT OCCUPATIONAL PROFILE AND HISTORY: Problem focused assessment: Including review of records relating to presenting problem.  CLINICAL DECISION MAKING: LOW - limited treatment options, no task modification necessary  REHAB POTENTIAL: Good  EVALUATION COMPLEXITY: Low    PLAN: OT FREQUENCY: 2x/week  OT DURATION: 4 weeks  PLANNED INTERVENTIONS: self care/ADL training, therapeutic exercise, therapeutic activity, neuromuscular re-education, manual therapy, functional mobility training, patient/family education, cognitive remediation/compensation, and DME and/or AE instructions  RECOMMENDED OTHER SERVICES: pt seen by PT and ST today  CONSULTED AND AGREED WITH PLAN OF CARE: Patient and family member/caregiver  PLAN FOR NEXT SESSION: review HEP's, consider adding goal for cooking and environmental scanning   Hans Eden, OT 12/23/2021, 9:35 AM

## 2021-12-27 ENCOUNTER — Telehealth: Payer: Self-pay | Admitting: Nurse Practitioner

## 2021-12-27 ENCOUNTER — Other Ambulatory Visit: Payer: Self-pay | Admitting: Adult Health Nurse Practitioner

## 2021-12-27 DIAGNOSIS — G43701 Chronic migraine without aura, not intractable, with status migrainosus: Secondary | ICD-10-CM

## 2021-12-27 NOTE — Telephone Encounter (Signed)
Pt called saying pharmacy has not received any e prescription for the [name], wanting it resent to Kimberly Zhang on file

## 2021-12-28 ENCOUNTER — Ambulatory Visit: Payer: BC Managed Care – PPO | Admitting: Speech Pathology

## 2021-12-28 ENCOUNTER — Encounter: Payer: Self-pay | Admitting: Occupational Therapy

## 2021-12-28 ENCOUNTER — Ambulatory Visit: Payer: BC Managed Care – PPO | Admitting: Physical Therapy

## 2021-12-28 ENCOUNTER — Ambulatory Visit: Payer: BC Managed Care – PPO | Admitting: Occupational Therapy

## 2021-12-28 VITALS — BP 89/65 | HR 61

## 2021-12-28 DIAGNOSIS — R2681 Unsteadiness on feet: Secondary | ICD-10-CM

## 2021-12-28 DIAGNOSIS — M6281 Muscle weakness (generalized): Secondary | ICD-10-CM | POA: Diagnosis not present

## 2021-12-28 DIAGNOSIS — R278 Other lack of coordination: Secondary | ICD-10-CM

## 2021-12-28 DIAGNOSIS — R482 Apraxia: Secondary | ICD-10-CM

## 2021-12-28 DIAGNOSIS — R41840 Attention and concentration deficit: Secondary | ICD-10-CM

## 2021-12-28 DIAGNOSIS — R2689 Other abnormalities of gait and mobility: Secondary | ICD-10-CM

## 2021-12-28 NOTE — Patient Instructions (Signed)
  Coordination Activities  Perform the following activities for 10 minutes 1-2 times per day with right hand(s).  Rotate ball in fingertips (clockwise and counter-clockwise). Deal cards with your thumb (Hold deck in hand and push card off top with thumb). Pick up coins one at a time until you get 5 in your hand, then move coins from palm to fingertips to stack one at a time. Practice writing and typing Practice buttons

## 2021-12-28 NOTE — Therapy (Signed)
OUTPATIENT SPEECH LANGUAGE PATHOLOGY TREATMENT NOTE   Patient Name: Kimberly Zhang MRN: [id number] DOB:Jan 23, 1966, 56 y.o., female Today's Date: 12/28/2021  PCP: Unk Pinto MD REFERRING PROVIDER: Magda Bernheim, NP   END OF SESSION:   End of Session - 12/28/21 0908     Visit Number 3    Number of Visits 17    Date for SLP Re-Evaluation 02/09/22    Authorization Type BCBS    SLP Start Time [phone]    SLP Stop Time  0700    SLP Time Calculation (min) 1293 min    Activity Tolerance Patient limited by lethargy;Patient limited by fatigue              Past Medical History:  Diagnosis Date   Allergy    Anxiety    Asthma    Benign hematuria 2011   Ottelin   Hyperlipidemia    Migraine    Vitamin D deficiency    Past Surgical History:  Procedure Laterality Date   ABDOMINAL HYSTERECTOMY  1998   COLONOSCOPY  2018   LEEP     POLYPECTOMY     TONSILECTOMY/ADENOIDECTOMY WITH MYRINGOTOMY     Patient Active Problem List   Diagnosis Date Noted   Transient ischemic attack (TIA) 12/10/2021   B12 deficiency 04/21/2021   Obesity (BMI 30.0-34.9) 12/01/2015   Female genuine stress incontinence 10/20/2014   Benign hematuria    Hyperlipidemia    Anxiety    Asthma    Migraine    Vitamin D deficiency     ONSET DATE: 12/10/21   REFERRING DIAG: G45.9 (ICD-10-CM) - Transient cerebral ischemic attack, unspecified   THERAPY DIAG: Verbal apraxia  Rationale for Evaluation and Treatment Rehabilitation  SUBJECTIVE: "it is coming along"  PAIN:  Are you having pain? No  OBJECTIVE:   TODAY'S TREATMENT:  12-28-21: Spontaneous, unstructured conversational sample of ~20 minutes pt presenting with verbal speech within gross normal limits. Some halting speech noted, though does not affect intelligibility. SLP reinforces positive practices pt reports to engaging in. Variable speech quality, reports sometimes speech seems "slow" but pt is compensating through self-care and slowing rate  to achieve clear articulation. Readily seeking out communication opportunities. Pt engaging in daily home practice of targeted exercises and oral readings. Led pt through oral reading task, featuring preferred topic news article. During, pt IND demonstrating slowed rate and over-articulation. No articulation errors, halting speech, or prolongations of vowels noted during ~5 minute reading. Of note, pt reports some executive dysfunction, would potentially benefit from skilled interventions to address this cognitive deficit based on further evaluation.   12-23-21: Reportedly and notably fatigued today. Grossly WNL verbal fluency exhibited in opening conversation with occasional pausing. Completed HEP targeting verbal apraxia. Oral reading at paragraph level was notably slow and effortful. Scaffolded task for reading at sentence level with repetition to target increasing fluency with mass practice. Pt able to improve fluency of speech after 2-3 repetitions. SLP targeted naming task with usual pausing and delays noted. Usual initial phoneme prolongation exhibited at word level, which mildly improved with SLP cues and demonstration. Conversational speech c/p reduced volume and occasional reduced rate. Fatigue appeared to greatly impact performance today. Updated HEP.  12-15-21: Initiated HEP for verbal apraxia with improved rate and fluency on structured alternating syllable and short word repetition. See pt instructions     PATIENT EDUCATION: Education details: HEP, good prognosis Person educated: Patient and Spouse Education method: Explanation, Demonstration, Verbal cues, and Handouts Education comprehension: verbalized understanding, returned demonstration,  verbal cues required, and needs further education     GOALS: Goals reviewed with patient? Yes   SHORT TERM GOALS: Target date: 01/12/22   Pt will complete HEP for verbal apraxia with mod I Baseline: Goal status: ongoing   2.  Pt will achieve WFL  rate and fluency in structure task 18/20 sentences with occasional min A Baseline:  Goal status: ongoing   3.  Pt will achieve WFL rate with 3 or less dysfluencies over 8 minute simple conversation with occasional min A over 2 sessions Baseline:  Goal status: ongoing     LONG TERM GOALS: Target date: 02/09/2022     Pt will achieve WFL rate and fluency over 18 minute complex conversation with rare min A Baseline:  Goal status: ongoing   2.  Pt will participate in informal Zoom call with functional rate and 2 or less dysfluencies  over 10 minute call Baseline:  Goal status: ongoing     ASSESSMENT:   CLINICAL IMPRESSION: Patient is a 56 y.o. female who was seen today for concerns re: slowed speech. 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 Speech with atypically slowed rate with inconsistent prolonged initial phonemes. Rate appeared significantly impacted by overt fatigue and lethargy. Continued education and training of verbal apraxia strategies to aid speech fluency. I recommend skilled ST to maximize normal rate of speech to return to PLOF and improve QOL   OBJECTIVE IMPAIRMENTS include apraxia. These impairments are limiting patient from return to work and effectively communicating at home and in community. Factors affecting potential to achieve goals and functional outcome are cooperation/participation level. Patient will benefit from skilled SLP services to address above impairments and improve overall function.   REHAB POTENTIAL: Good   PLAN: SLP FREQUENCY: 2x/week   SLP DURATION: 4 weeks   PLANNED INTERVENTIONS: Language facilitation, Environmental controls, Cueing hierachy, Internal/external aids, Functional tasks, Multimodal communication approach, and SLP instruction and feedback     Su Monks, CCC-SLP 12/28/2021, 9:28 AM

## 2021-12-28 NOTE — Therapy (Signed)
OUTPATIENT OCCUPATIONAL THERAPY NEURO TREATMENT  Patient Name: Kimberly Zhang MRN: [id number] DOB:[birth year], 56 y.o., female Today's Date: 12/28/2021  PCP: Unk Pinto, MD REFERRING PROVIDER: Magda Bernheim, NP   OT End of Session - 12/28/21 1033     Visit Number 3    Number of Visits 9    Date for OT Re-Evaluation 01/26/22    Authorization Type BCBS    Authorization Time Period VL:MN    OT Start Time 1030    OT Stop Time 1115    OT Time Calculation (min) 45 min    Activity Tolerance Patient tolerated treatment well    Behavior During Therapy [facility] for tasks assessed/performed             Past Medical History:  Diagnosis Date   Allergy    Anxiety    Asthma    Benign hematuria 2011   Ottelin   Hyperlipidemia    Migraine    Vitamin D deficiency    Past Surgical History:  Procedure Laterality Date   ABDOMINAL HYSTERECTOMY  1998   COLONOSCOPY  2018   LEEP     POLYPECTOMY     TONSILECTOMY/ADENOIDECTOMY WITH MYRINGOTOMY     Patient Active Problem List   Diagnosis Date Noted   Transient ischemic attack (TIA) 12/10/2021   B12 deficiency 04/21/2021   Obesity (BMI 30.0-34.9) 12/01/2015   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 My husband does the cooking. I've done the laundry, sweeping, and even some vacuuming, and cleaning bathrooms.    PERTINENT HISTORY: hyperlipidemia, migraines (unspecified subtype), anxiety and depression   PRECAUTIONS: Fall   PAIN:  Are you having pain? 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   Probable minor chronic microvascular ischemic changes.  HAND DOMINANCE: Right   TODAY'S TREATMENT:   Reviewed HEP for shoulder girdle strengthening and putty HEP - pt demo well  Gripper set at level 2 resistance to pick up blocks Rt hand for sustained grip strength w/ min difficulty  Environmental scanning while tossing ball Rt hand finding 12/13 items on first pass in mod to max distracting gym.  Issued high level coordination HEP as pt reports difficulty w/ buttons and slightly with writing  Pt shown modifications for sweeping up dirt from floor for fall prevention  PATIENT EDUCATION: Education details: coordination HEP  Person educated: Patient Education method: Explanation, Demonstration, Verbal cues, and Handouts Education comprehension: verbalized understanding, returned demonstration, and verbal cues required   HOME EXERCISE PROGRAM: 12/23/21: Shoulder proximal strengthening HEP, Putty HEP  12/28/21: high level coordination HEP    GOALS: Goals reviewed with patient? No   LONG TERM GOALS: Target date: 02/08/2022  Pt will be independent with HEP targeting RUE proximal strength and grip strength  Baseline:  Goal status: MET  2.  Pt will improve grip strength by at least 5 lbs with RUE for increasing functional use in dominant hand  Baseline: Right: 18.5 lbs; Left: 40.3 lbs Goal status: IN PROGRESS (12/28/21: 25.5 LBS)  3.  Pt will verbalize understanding of adapted strategies and/or equipment PRN to increase safety and independence with ADLs and IADLs (I.e. handwriting and functional activities)  Baseline:  Goal status: DEFERRED - no longer  needed   ASSESSMENT:  CLINICAL IMPRESSION: Patient has met LTG #1 and progressing towards remaining grip strength goal. Pt w/ mild scapula weakness and posterior sh girdle weakness Rt side, however this has improved since last week as she has been doing her HEP.     PERFORMANCE DEFICITS in functional skills including ADLs, IADLs, coordination, strength, GMC, decreased knowledge of use of DME, and UE functional use, cognitive skills including problem solving and temperament/personality, and psychosocial skills including coping strategies and routines and behaviors.   IMPAIRMENTS are limiting patient from ADLs, IADLs, work, and leisure.   COMORBIDITIES has no other co-morbidities that affects occupational performance. Patient will benefit from skilled OT to address above impairments and improve overall function.  MODIFICATION OR ASSISTANCE TO COMPLETE EVALUATION: No modification of tasks or assist necessary to complete an evaluation.  OT OCCUPATIONAL PROFILE AND HISTORY: Problem focused assessment: Including review of records relating to presenting problem.  CLINICAL DECISION MAKING: LOW - limited treatment options, no task modification necessary  REHAB POTENTIAL: Good  EVALUATION COMPLEXITY: Low    PLAN: OT FREQUENCY: 2x/week  OT DURATION: 4 weeks  PLANNED INTERVENTIONS: self care/ADL training, therapeutic exercise, therapeutic activity, neuromuscular re-education, manual therapy, functional mobility training, patient/family education, cognitive remediation/compensation, and DME and/or AE instructions  RECOMMENDED OTHER SERVICES: pt seen by PT and ST today  CONSULTED AND AGREED WITH PLAN OF CARE: Patient and family member/caregiver  PLAN FOR NEXT SESSION: continue grip strength, UBE, address any other difficulties and d/c next session   Hans Eden, OT 12/28/2021, 10:34 AM

## 2021-12-28 NOTE — Therapy (Signed)
OUTPATIENT PHYSICAL THERAPY TREATMENT NOTE   Patient Name: Kimberly Zhang MRN: [id number] DOB:May 05, 1966, 56 y.o., female Today's Date: 12/28/2021  PCP: Unk Pinto, MD REFERRING PROVIDER: Magda Bernheim, NP   END OF SESSION:   PT End of Session - 12/28/21 0847     Visit Number 3    Number of Visits 13   Plus eval   Date for PT Re-Evaluation 03/09/22    Authorization Type BCBS    PT Start Time 0846    PT Stop Time 0925    PT Time Calculation (min) 39 min    Equipment Utilized During Treatment --    Activity Tolerance Patient tolerated treatment well    Behavior During Therapy [facility] for tasks assessed/performed              Past Medical History:  Diagnosis Date   Allergy    Anxiety    Asthma    Benign hematuria 2011   Ottelin   Hyperlipidemia    Migraine    Vitamin D deficiency    Past Surgical History:  Procedure Laterality Date   ABDOMINAL HYSTERECTOMY  1998   COLONOSCOPY  2018   LEEP     POLYPECTOMY     TONSILECTOMY/ADENOIDECTOMY WITH MYRINGOTOMY     Patient Active Problem List   Diagnosis Date Noted   Transient ischemic attack (TIA) 12/10/2021   B12 deficiency 04/21/2021   Obesity (BMI 30.0-34.9) 12/01/2015   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 Pt reports she has been getting in/out of the shower at home via side stepping and exercises are going well. In higher spirits today.   PAIN:  Are you having pain? No   OBJECTIVE: (objective measures completed at initial  evaluation unless otherwise dated)  TODAY'S TREATMENT:   Ther Act  POSITIONAL TESTS:  Right Dix-Hallpike: none; Duration:60 Left Dix-Hallpike: none; Duration: 60s   Pt reported dizziness that lasted for 10-20s while sitting up, no nystagmus noted   Ther Ex  Updated and demonstrated HEP for improved single leg stability, acclimation to veritcal head movement and BLE strength:   - Side Stepping with Red Resistance at [employer] and Counter Support  - down and back x2   Sports administrator Walk with Red Band at [employer] and [employer]  - down and back x2   - Marching with Resistance (red band) - x5 reps per side   - Forward Step Down Touch with Heel - x5 per side w/3s eccentric lower and LUE support on rail       PATIENT EDUCATION: Education details: Updates to HEP  Person educated: Patient and Spouse Education method: Customer service manager Education comprehension: verbalized understanding and needs further education     HOME EXERCISE PROGRAM: Access Code: [email or web address] URL: [email or web address] Date: 12/28/2021 Prepared by: Mickie Bail Delories Mauri  Exercises - Standing Single Leg Stance with Counter Support  - 1 x daily - 7 x weekly - 3 sets - 10 reps - Heel Toe Raises with Counter Support  - 1 x  daily - 7 x weekly - 3 sets - 10 reps - Side Stepping with Red Resistance Band at Thighs and Counter Support  - 1 x daily - 7 x weekly - 3 sets - 10 reps - Forward Backward Monster Walk with Red Band at Thighs and Counter Support  - 1 x daily - 7 x weekly - 3 sets - 10 reps - Marching with Red Resistance Band - 1 x daily - 7 x weekly - 3 sets - 10 reps - Forward Step Down Touch with Heel  - 1 x daily - 7 x weekly - 3 sets - 10 reps     GOALS: Goals reviewed with patient? Yes   SHORT TERM GOALS: Target date: 01/12/2022   Pt will be independent with initial HEP for improved strength, balance, transfers and gait. Baseline: Established 12/23/2021 Goal status:  INITIAL   2.  Pt will improve 5 x STS to less than or equal to 25 seconds without UE support to demonstrate improved functional strength and transfer efficiency.  Baseline: 35.21s without UE support  Goal status: INITIAL   3.  Pt will increase BERG balance score to 48/56 to demonstrate improved static balance. Baseline: 44/56 Goal status: INITIAL   4.  Pt will ambulate>350 feet on 2MWT to demonstrate improved endurance for functional tasks in home and community. Baseline: 228' Goal status: INITIAL     LONG TERM GOALS: Target date: 02/09/2022   Pt will be independent with final HEP for improved strength, balance, transfers and gait. Baseline: To be established Goal status: INITIAL   2.  Pt will improve gait velocity to at least 3.1 ft/s without AD for improved gait efficiency  Baseline: 2.5 ft/s without AD Goal status: INITIAL   3.  Pt will increase BERG balance score to 52/56 to demonstrate improved static balance. Baseline: See STG. Goal status: INITIAL   4.  Pt will ambulate>500 feet on 2MWT to demonstrate improved endurance for functional tasks in home and community. Baseline: See STG. Goal status: INITIAL   5.  Pt will improve 5 x STS to less than or equal to 20 seconds without UE support to demonstrate improved functional strength and transfer efficiency.  Baseline: 35.21s without UE support  Goal status: INITIAL     ASSESSMENT:   CLINICAL IMPRESSION: Emphasis of skilled PT session on assessing for BPPV and updated HEP for dynamic hip/BLE strength as pt more confident with balance. No nystagmus noted w/Dix-Hallpike on L and R side, pt asymptomatic in position but reported dizziness while sitting up that dissipated after 30s. May benefit from further vestibular assessment. Pt very fearful to perform retro gait initially but performed task well without LOB. Pt is progressing well in PT but continues to be limited by dizziness w/head turns in vertical direction. Continue POC       OBJECTIVE IMPAIRMENTS Abnormal gait, decreased activity tolerance, decreased balance, decreased endurance, decreased knowledge of use of DME, decreased mobility, difficulty walking, decreased strength, dizziness, and impaired sensation.    ACTIVITY LIMITATIONS carrying, lifting, bending, squatting, stairs, transfers, bed mobility, and locomotion level   PARTICIPATION LIMITATIONS: meal prep, cleaning, laundry, interpersonal relationship, driving, shopping, community activity, and occupation   [place] and 1 comorbidity: dizziness  are also affecting patient's functional outcome.    REHAB POTENTIAL: Good   CLINICAL DECISION MAKING: Stable/uncomplicated   EVALUATION COMPLEXITY: Low   PLAN: PT FREQUENCY:  2x/wk for 4 weeks and 1x/wk for 4 weeks   PT DURATION: 12 weeks  PLANNED INTERVENTIONS: Therapeutic exercises, Therapeutic activity, Neuromuscular re-education, Balance training, Gait training, Patient/Family education, Stair training, and DME instructions, vestibular training, canalith repositioning.   PLAN FOR NEXT SESSION: FGA? VOR x1 in vertical direction, Treadmill training, RLE strengthening and balance HEP prn    Raye Wiens E Kire Ferg, PT, DPT 12/28/2021, 10:04 AM

## 2021-12-30 ENCOUNTER — Ambulatory Visit: Payer: BC Managed Care – PPO | Admitting: Speech Pathology

## 2021-12-30 ENCOUNTER — Ambulatory Visit: Payer: BC Managed Care – PPO | Admitting: Occupational Therapy

## 2022-01-04 ENCOUNTER — Ambulatory Visit: Payer: BC Managed Care – PPO

## 2022-01-04 ENCOUNTER — Ambulatory Visit: Payer: BC Managed Care – PPO | Admitting: Physical Therapy

## 2022-01-04 ENCOUNTER — Ambulatory Visit: Payer: BC Managed Care – PPO | Admitting: Occupational Therapy

## 2022-01-04 ENCOUNTER — Encounter: Payer: Self-pay | Admitting: Occupational Therapy

## 2022-01-04 DIAGNOSIS — R278 Other lack of coordination: Secondary | ICD-10-CM

## 2022-01-04 DIAGNOSIS — R2681 Unsteadiness on feet: Secondary | ICD-10-CM

## 2022-01-04 DIAGNOSIS — R41840 Attention and concentration deficit: Secondary | ICD-10-CM

## 2022-01-04 DIAGNOSIS — R482 Apraxia: Secondary | ICD-10-CM

## 2022-01-04 DIAGNOSIS — M6281 Muscle weakness (generalized): Secondary | ICD-10-CM | POA: Diagnosis not present

## 2022-01-04 DIAGNOSIS — R41844 Frontal lobe and executive function deficit: Secondary | ICD-10-CM

## 2022-01-04 DIAGNOSIS — R2689 Other abnormalities of gait and mobility: Secondary | ICD-10-CM

## 2022-01-04 NOTE — Therapy (Signed)
OUTPATIENT OCCUPATIONAL THERAPY NEURO TREATMENT & DISCHARGE  Patient Name: Kimberly Zhang MRN: [id number] DOB:[birth year], 56 y.o., female Today's Date: 01/04/2022  PCP: Unk Pinto, MD REFERRING PROVIDER: Magda Bernheim, NP   OT End of Session - 01/04/22 0931     Visit Number 4    Number of Visits 9    Date for OT Re-Evaluation 01/26/22    Authorization Type BCBS    Authorization Time Period VL:MN    OT Start Time 0930    OT Stop Time 0950    OT Time Calculation (min) 20 min    Activity Tolerance Patient tolerated treatment well    Behavior During Therapy [facility] for tasks assessed/performed             Past Medical History:  Diagnosis Date   Allergy    Anxiety    Asthma    Benign hematuria 2011   Ottelin   Hyperlipidemia    Migraine    Vitamin D deficiency    Past Surgical History:  Procedure Laterality Date   ABDOMINAL HYSTERECTOMY  1998   COLONOSCOPY  2018   LEEP     POLYPECTOMY     TONSILECTOMY/ADENOIDECTOMY WITH MYRINGOTOMY     Patient Active Problem List   Diagnosis Date Noted   Transient ischemic attack (TIA) 12/10/2021   B12 deficiency 04/21/2021   Obesity (BMI 30.0-34.9) 12/01/2015   Female genuine stress incontinence 10/20/2014   Benign hematuria    Hyperlipidemia    Anxiety    Asthma    Migraine    Vitamin D deficiency      Rationale for Evaluation and Treatment Rehabilitation    ONSET DATE: 12/10/21  REFERRING DIAG: G45.9 (ICD-10-CM) - Transient cerebral ischemic attack, unspecified  THERAPY DIAG:  Frontal lobe and executive function deficit  Unsteadiness on feet  Muscle weakness (generalized)  Other lack of coordination  Attention and concentration deficit  SUBJECTIVE:   SUBJECTIVE STATEMENT: Handwriting is doing better writing larger. My husband does the cooking. I've done the laundry, sweeping, and even some vacuuming, and cleaning bathrooms.    PERTINENT HISTORY: hyperlipidemia, migraines (unspecified subtype),  anxiety and depression   PRECAUTIONS: Fall   PAIN:  Are you having pain? No  PLOF: Vocation/Vocational requirements: Full time - [employer], board for running [employer] in [place], Leisure: play with grandchildren, and Independent PLOF  PATIENT GOALS "move like I normally would" re: RUE  OBJECTIVE:    DIAGNOSTIC FINDINGS:  MRI 12/10/21 IMPRESSION: No acute infarction, hemorrhage, or mass.   Probable minor chronic microvascular ischemic changes.  HAND DOMINANCE: Right   TODAY'S TREATMENT:    Hand Gripper: with RUE on level 3 with BLACK spring. Pt picked up 1 inch blocks with gripper with min drops and min difficulty.  Arm Bike: for conditioning and reciprocal movements on Level 1 for 8 minutes (forward and backward)   OCCUPATIONAL THERAPY DISCHARGE SUMMARY  Visits from Start of Care: 4  Current functional level related to goals / functional outcomes: Pt has improved and met all goals.    Remaining deficits: Pt reports still challenge with picking up small things.   Education / Equipment: HEPs   Patient agrees to discharge. Patient goals were met. Patient is being discharged due to meeting the stated rehab goals.Marland Kitchen       HOME EXERCISE PROGRAM: 12/23/21: Shoulder proximal strengthening HEP, Putty HEP  12/28/21: high level coordination HEP    GOALS: Goals reviewed with patient? No   LONG TERM  GOALS: Target date: 02/15/2022  Pt will be independent with HEP targeting RUE proximal strength and grip strength  Baseline:  Goal status: MET  2.  Pt will improve grip strength by at least 5 lbs with RUE for increasing functional use in dominant hand  Baseline: Right: 18.5 lbs; Left: 40.3 lbs Goal status: MET (12/28/21: 25.5 LBS, 01/04/22 49.8 LBS)  3.  Pt will verbalize understanding of adapted strategies and/or equipment PRN to increase safety and independence with ADLs and IADLs (I.e. handwriting and functional activities)  Baseline:  Goal status:  DEFERRED - no longer needed   ASSESSMENT:  CLINICAL IMPRESSION: Patient has met all goals and is ready for d/c from OT at this time.   PERFORMANCE DEFICITS in functional skills including ADLs, IADLs, coordination, strength, GMC, decreased knowledge of use of DME, and UE functional use, cognitive skills including problem solving and temperament/personality, and psychosocial skills including coping strategies and routines and behaviors.   IMPAIRMENTS are limiting patient from ADLs, IADLs, work, and leisure.   COMORBIDITIES has no other co-morbidities that affects occupational performance. Patient will benefit from skilled OT to address above impairments and improve overall function.  MODIFICATION OR ASSISTANCE TO COMPLETE EVALUATION: No modification of tasks or assist necessary to complete an evaluation.  OT OCCUPATIONAL PROFILE AND HISTORY: Problem focused assessment: Including review of records relating to presenting problem.  CLINICAL DECISION MAKING: LOW - limited treatment options, no task modification necessary  REHAB POTENTIAL: Good  EVALUATION COMPLEXITY: Low    PLAN: OT FREQUENCY: 2x/week  OT DURATION: 4 weeks  PLANNED INTERVENTIONS: self care/ADL training, therapeutic exercise, therapeutic activity, neuromuscular re-education, manual therapy, functional mobility training, patient/family education, cognitive remediation/compensation, and DME and/or AE instructions  RECOMMENDED OTHER SERVICES: pt seen by PT and ST today  CONSULTED AND AGREED WITH PLAN OF CARE: Patient and family member/caregiver  PLAN FOR NEXT SESSION: discharge from [place], [place] 01/04/2022, 9:50 AM

## 2022-01-04 NOTE — Therapy (Signed)
OUTPATIENT SPEECH LANGUAGE PATHOLOGY TREATMENT NOTE   Patient Name: Kimberly Zhang MRN: [id number] DOB:1965-08-25, 56 y.o., female Today's Date: 01/04/2022  PCP: Unk Pinto MD REFERRING PROVIDER: Magda Bernheim, NP   END OF SESSION:   End of Session - 01/04/22 0853     Visit Number 4    Number of Visits 17    Date for SLP Re-Evaluation 02/09/22    Authorization Type BCBS    SLP Start Time 3299    SLP Stop Time  2426    SLP Time Calculation (min) 43 min    Activity Tolerance Patient tolerated treatment well              Past Medical History:  Diagnosis Date   Allergy    Anxiety    Asthma    Benign hematuria 2011   Ottelin   Hyperlipidemia    Migraine    Vitamin D deficiency    Past Surgical History:  Procedure Laterality Date   ABDOMINAL HYSTERECTOMY  1998   COLONOSCOPY  2018   LEEP     POLYPECTOMY     TONSILECTOMY/ADENOIDECTOMY WITH MYRINGOTOMY     Patient Active Problem List   Diagnosis Date Noted   Transient ischemic attack (TIA) 12/10/2021   B12 deficiency 04/21/2021   Obesity (BMI 30.0-34.9) 12/01/2015   Female genuine stress incontinence 10/20/2014   Benign hematuria    Hyperlipidemia    Anxiety    Asthma    Migraine    Vitamin D deficiency     ONSET DATE: 12/10/21   REFERRING DIAG: G45.9 (ICD-10-CM) - Transient cerebral ischemic attack, unspecified   THERAPY DIAG: Verbal apraxia  Rationale for Evaluation and Treatment Rehabilitation  SUBJECTIVE: "It's going well"   PAIN:  Are you having pain? No  OBJECTIVE:   TODAY'S TREATMENT:  01-04-22: Pt entered with grossly WNL verbal expression with occasional fading to rare pausing and halting noted in conversation. Some initial delayed processing reported, which pt feels like has improved overall. Pt does not feel concerned with cognitive functioning at this time and denied need for further evaluation. Will continue to monitor in upcoming ST sessions. Some reduced speech fluency  indicated in mornings and evenings, in which SLP recommended over-articulation when fatigue occurs resulting in slurred speech. Targeted extended Q and A conversation today, in which pt noted with usual WNL fluency, rare pausing/halting, and seemingly WNL processing for responding to questions. Today, pt rated current speech as "60-70%" on scale of 100% back to baseline, which is improved compared to "5%" at initial evaluation.   12-28-21: Spontaneous, unstructured conversational sample of ~20 minutes pt presenting with verbal speech within gross normal limits. Some halting speech noted, though does not affect intelligibility. SLP reinforces positive practices pt reports to engaging in. Variable speech quality, reports sometimes speech seems "slow" but pt is compensating through self-care and slowing rate to achieve clear articulation. Readily seeking out communication opportunities. Pt engaging in daily home practice of targeted exercises and oral readings. Led pt through oral reading task, featuring preferred topic news article. During, pt IND demonstrating slowed rate and over-articulation. No articulation errors, halting speech, or prolongations of vowels noted during ~5 minute reading. Of note, pt reports some executive dysfunction, would potentially benefit from skilled interventions to address this cognitive deficit based on further evaluation.   12-23-21: Reportedly and notably fatigued today. Grossly WNL verbal fluency exhibited in opening conversation with occasional pausing. Completed HEP targeting verbal apraxia. Oral reading at paragraph level was notably slow and  effortful. Scaffolded task for reading at sentence level with repetition to target increasing fluency with mass practice. Pt able to improve fluency of speech after 2-3 repetitions. SLP targeted naming task with usual pausing and delays noted. Usual initial phoneme prolongation exhibited at word level, which mildly improved with SLP cues and  demonstration. Conversational speech c/p reduced volume and occasional reduced rate. Fatigue appeared to greatly impact performance today. Updated HEP.  12-15-21: Initiated HEP for verbal apraxia with improved rate and fluency on structured alternating syllable and short word repetition. See pt instructions     PATIENT EDUCATION: Education details: HEP, good prognosis Person educated: Patient and Spouse Education method: Explanation, Demonstration, Verbal cues, and Handouts Education comprehension: verbalized understanding, returned demonstration, verbal cues required, and needs further education     GOALS: Goals reviewed with patient? Yes   SHORT TERM GOALS: Target date: 01/12/22   Pt will complete HEP for verbal apraxia with mod I Baseline: Goal status: Met   2.  Pt will achieve WFL rate and fluency in structure task 18/20 sentences with occasional min A Baseline:  Goal status: Met    3.  Pt will achieve WFL rate with 3 or less dysfluencies over 8 minute simple conversation with occasional min A over 2 sessions Baseline: 01-04-22 Goal status: ongoing     LONG TERM GOALS: Target date: 02/09/2022     Pt will achieve WFL rate and fluency over 18 minute complex conversation with rare min A Baseline: 01-04-22 Goal status: ongoing   2.  Pt will participate in informal Zoom call with functional rate and 2 or less dysfluencies over 10 minute call Baseline:  Goal status: ongoing     ASSESSMENT:   CLINICAL IMPRESSION: Patient is a 56 y.o. female who was seen today for concerns re: slowed speech. 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 Speech with improving rate and less frequent halting/pausing. Continued education and training of verbal apraxia strategies to aid speech fluency. I recommend skilled ST to maximize normal rate of speech to return to PLOF and improve QOL   OBJECTIVE IMPAIRMENTS  include apraxia. These impairments are limiting patient from return to work and effectively communicating at home and in community. Factors affecting potential to achieve goals and functional outcome are cooperation/participation level. Patient will benefit from skilled SLP services to address above impairments and improve overall function.   REHAB POTENTIAL: Good   PLAN: SLP FREQUENCY: 2x/week   SLP DURATION: 4 weeks   PLANNED INTERVENTIONS: Language facilitation, Environmental controls, Cueing hierachy, Internal/external aids, Functional tasks, Multimodal communication approach, and SLP instruction and feedback     Marzetta Board, CCC-SLP 01/04/2022, 9:54 AM

## 2022-01-04 NOTE — Patient Instructions (Signed)
Typing Websites:  [email or web address] [email or web address] [email or web address] [email or web address]   Mouse Accuracy:  [email or web address]  

## 2022-01-04 NOTE — Therapy (Signed)
OUTPATIENT PHYSICAL THERAPY TREATMENT NOTE   Patient Name: Kimberly Zhang MRN: [id number] DOB:1966/07/05, 56 y.o., female Today's Date: 01/04/2022  PCP: Unk Pinto, MD REFERRING PROVIDER: Magda Bernheim, NP   END OF SESSION:   PT End of Session - 01/04/22 0853     Visit Number 4    Number of Visits 13   Plus eval   Date for PT Re-Evaluation 03/09/22    Authorization Type BCBS    PT Start Time [phone]   Pt arrived late   PT Stop Time 0928    PT Time Calculation (min) 37 min    Activity Tolerance Patient tolerated treatment well    Behavior During Therapy [facility] for tasks assessed/performed              Past Medical History:  Diagnosis Date   Allergy    Anxiety    Asthma    Benign hematuria 2011   Ottelin   Hyperlipidemia    Migraine    Vitamin D deficiency    Past Surgical History:  Procedure Laterality Date   ABDOMINAL HYSTERECTOMY  1998   COLONOSCOPY  2018   LEEP     POLYPECTOMY     TONSILECTOMY/ADENOIDECTOMY WITH MYRINGOTOMY     Patient Active Problem List   Diagnosis Date Noted   Transient ischemic attack (TIA) 12/10/2021   B12 deficiency 04/21/2021   Obesity (BMI 30.0-34.9) 12/01/2015   Female genuine stress incontinence 10/20/2014   Benign hematuria    Hyperlipidemia    Anxiety    Asthma    Migraine    Vitamin D deficiency     REFERRING DIAG: G45.9 (ICD-10-CM) - Transient ischemic attack (TIA)  THERAPY DIAG:  Unsteadiness on feet   Other abnormalities of gait and mobility   Rationale for Evaluation and Treatment Rehabilitation  PERTINENT HISTORY: hyperlipidemia, migraines (unspecified subtype), anxiety and depression   PRECAUTIONS: Fall  SUBJECTIVE: Pt reports she is doing well, is painting her kitchen cabinets w/her husband. Exercises are going well. No falls but several near-misses, multiple on the stairs and once outside in driveway.   PAIN:  Are you having pain? No   OBJECTIVE: (objective measures completed at initial  evaluation unless otherwise dated)  TODAY'S TREATMENT:   Ther Act   [facility] PT Assessment - 01/04/22 0858       Functional Gait  Assessment   Gait assessed  Yes    Gait Level Surface Walks 20 ft in less than 7 sec but greater than 5.5 sec, uses assistive device, slower speed, mild gait deviations, or deviates 6-10 in outside of the 12 in walkway width.   5.97s   Change in Gait Speed Makes only minor adjustments to walking speed, or accomplishes a change in speed with significant gait deviations, deviates 10-15 in outside the 12 in walkway width, or changes speed but loses balance but is able to recover and continue walking.    Gait with Horizontal Head Turns Performs head turns smoothly with slight change in gait velocity (eg, minor disruption to smooth gait path), deviates 6-10 in outside 12 in walkway width, or uses an assistive device.    Gait with Vertical Head Turns Performs task with slight change in gait velocity (eg, minor disruption to smooth gait path), deviates 6 - 10 in outside 12 in walkway width or uses assistive device    Gait and Pivot Turn Pivot turns safely within 3 sec and stops quickly with no loss of balance.    Step Over Obstacle  Is able to step over one shoe box (4.5 in total height) without changing gait speed. No evidence of imbalance.    Gait with Narrow Base of Support Is able to ambulate for 10 steps heel to toe with no staggering.   In a squat position   Gait with Eyes Closed Walks 20 ft, uses assistive device, slower speed, mild gait deviations, deviates 6-10 in outside 12 in walkway width. Ambulates 20 ft in less than 9 sec but greater than 7 sec.   8s   Ambulating Backwards Walks 20 ft, slow speed, abnormal gait pattern, evidence for imbalance, deviates 10-15 in outside 12 in walkway width.   38.72s   Steps Two feet to a stair, must use rail.   Reciprocal pattern to ascend, step-to to descend   Total Score 19    FGA comment: Medium fall risk            NMR   Added VOR x1 in standing position w/vertical and horizontal head turns to HEP (see bolded below) due to continued reports of dizziness. Pt able to perform well, min cues to increase speed of head turns to exacerbate dizziness to level of 3-4/10. Pt much more symptomatic w/vertical turns compared to horizontal.     PATIENT EDUCATION: Education details: Updates to HEP, using step-to pattern to descend steps   Person educated: Patient and Spouse Education method: Customer service manager and Handout  Education comprehension: verbalized understanding and needs further education     HOME EXERCISE PROGRAM: Access Code: [email or web address] URL: [email or web address] Date: 12/28/2021 Prepared by: Mickie Bail Yuan Gann  Exercises - Standing Single Leg Stance with Counter Support  - 1 x daily - 7 x weekly - 3 sets - 10 reps - Heel Toe Raises with Counter Support  - 1 x daily - 7 x weekly - 3 sets - 10 reps - Side Stepping with Red Resistance Band at Thighs and Counter Support  - 1 x daily - 7 x weekly - 3 sets - 10 reps - Forward Backward Monster Walk with Red Band at Thighs and Counter Support  - 1 x daily - 7 x weekly - 3 sets - 10 reps - Marching with Red Resistance Band - 1 x daily - 7 x weekly - 3 sets - 10 reps - Forward Step Down Touch with Heel  - 1 x daily - 7 x weekly - 3 sets - 10 reps - Gaze Stability (VOR) x1 Feet Together on Firm Ground With Horizontal Head Turns  - 1 x daily - 7 x weekly - 3 sets - 10 reps - Gaze Stability (VOR) x1 Feet Together on Firm Ground With Vertical Head Turns  - 1 x daily - 7 x weekly - 3 sets - 10 reps     GOALS: Goals reviewed with patient? Yes   SHORT TERM GOALS: Target date: 01/12/2022   Pt will be independent with initial HEP for improved strength, balance, transfers and gait. Baseline: Established 12/23/2021 Goal status: INITIAL   2.  Pt will improve 5 x STS to less than or equal to 25 seconds without UE support to demonstrate improved  functional strength and transfer efficiency.  Baseline: 35.21s without UE support  Goal status: INITIAL   3.  Pt will increase BERG balance score to 48/56 to demonstrate improved static balance. Baseline: 44/56 Goal status: INITIAL   4.  Pt will ambulate>350 feet on 2MWT to demonstrate improved endurance for functional tasks in home and community. Baseline: 228' Goal status:  INITIAL     LONG TERM GOALS: Target date: 02/09/2022   Pt will be independent with final HEP for improved strength, balance, transfers and gait. Baseline: To be established Goal status: INITIAL   2.  Pt will improve gait velocity to at least 3.1 ft/s without AD for improved gait efficiency  Baseline: 2.5 ft/s without AD Goal status: INITIAL   3.  Pt will improve FGA to 25/30 for decreased fall risk   Baseline: 19/30 Goal status: REVISED   4.  Pt will ambulate>500 feet on 2MWT to demonstrate improved endurance for functional tasks in home and community. Baseline: See STG. Goal status: INITIAL   5.  Pt will improve 5 x STS to less than or equal to 20 seconds without UE support to demonstrate improved functional strength and transfer efficiency.  Baseline: 35.21s without UE support  Goal status: INITIAL     ASSESSMENT:   CLINICAL IMPRESSION: Emphasis of skilled PT session on balance assessment and adding gaze stabilization to HEP. Pt achieved a 19/30 on FGA, noting the most significant difficulty w/retro gait and stairs. Pt continues to exhibit fear-avoidance behavior w/dynamic gait but has no stability issues. Updated LTG to reflect score in FGA. Added standing VOR x1 in vertical and horizontal direction to HEP. Pt performed well in clinic and reported mild dizziness w/activity. Plan to add metronome to activity next session for increased speed. Continue POC.      OBJECTIVE IMPAIRMENTS Abnormal gait, decreased activity tolerance, decreased balance, decreased endurance, decreased knowledge of use of DME,  decreased mobility, difficulty walking, decreased strength, dizziness, and impaired sensation.    ACTIVITY LIMITATIONS carrying, lifting, bending, squatting, stairs, transfers, bed mobility, and locomotion level   PARTICIPATION LIMITATIONS: meal prep, cleaning, laundry, interpersonal relationship, driving, shopping, community activity, and occupation   [place] and 1 comorbidity: dizziness  are also affecting patient's functional outcome.    REHAB POTENTIAL: Good   CLINICAL DECISION MAKING: Stable/uncomplicated   EVALUATION COMPLEXITY: Low   PLAN: PT FREQUENCY:  2x/wk for 4 weeks and 1x/wk for 4 weeks   PT DURATION: 12 weeks   PLANNED INTERVENTIONS: Therapeutic exercises, Therapeutic activity, Neuromuscular re-education, Balance training, Gait training, Patient/Family education, Stair training, and DME instructions, vestibular training, canalith repositioning.   PLAN FOR NEXT SESSION: How is HEP? Treadmill training, gait training outside, squatting practice on floor mat, eccentric heel taps, balance on unlevel surfaces, floor transfers   Cruzita Lederer Lakya Schrupp, PT, DPT 01/04/2022, 9:34 AM

## 2022-01-06 ENCOUNTER — Ambulatory Visit: Payer: BC Managed Care – PPO

## 2022-01-06 ENCOUNTER — Ambulatory Visit: Payer: BC Managed Care – PPO | Admitting: Physical Therapy

## 2022-01-06 ENCOUNTER — Ambulatory Visit: Payer: BC Managed Care – PPO | Admitting: Speech Pathology

## 2022-01-06 ENCOUNTER — Encounter: Payer: BC Managed Care – PPO | Admitting: Occupational Therapy

## 2022-01-06 DIAGNOSIS — M6281 Muscle weakness (generalized): Secondary | ICD-10-CM

## 2022-01-06 DIAGNOSIS — R2681 Unsteadiness on feet: Secondary | ICD-10-CM

## 2022-01-06 DIAGNOSIS — R482 Apraxia: Secondary | ICD-10-CM

## 2022-01-06 NOTE — Therapy (Signed)
OUTPATIENT SPEECH LANGUAGE PATHOLOGY TREATMENT NOTE   Patient Name: Kimberly Zhang MRN: [id number] DOB:January 16, 1966, 56 y.o., female Today's Date: 01/06/2022  PCP: Unk Pinto MD REFERRING PROVIDER: Magda Bernheim, NP   END OF SESSION:   End of Session - 01/06/22 0931     Visit Number 5    Number of Visits 17    Date for SLP Re-Evaluation 02/09/22    Authorization Type BCBS    SLP Start Time 0930    SLP Stop Time  1011    SLP Time Calculation (min) 41 min    Activity Tolerance Patient tolerated treatment well              Past Medical History:  Diagnosis Date   Allergy    Anxiety    Asthma    Benign hematuria 2011   Ottelin   Hyperlipidemia    Migraine    Vitamin D deficiency    Past Surgical History:  Procedure Laterality Date   ABDOMINAL HYSTERECTOMY  1998   COLONOSCOPY  2018   LEEP     POLYPECTOMY     TONSILECTOMY/ADENOIDECTOMY WITH MYRINGOTOMY     Patient Active Problem List   Diagnosis Date Noted   Transient ischemic attack (TIA) 12/10/2021   B12 deficiency 04/21/2021   Obesity (BMI 30.0-34.9) 12/01/2015   Female genuine stress incontinence 10/20/2014   Benign hematuria    Hyperlipidemia    Anxiety    Asthma    Migraine    Vitamin D deficiency     ONSET DATE: 12/10/21   REFERRING DIAG: G45.9 (ICD-10-CM) - Transient cerebral ischemic attack, unspecified   THERAPY DIAG: Verbal apraxia  Rationale for Evaluation and Treatment Rehabilitation  SUBJECTIVE: "Everyone says I am talking very clear"   PAIN:  Are you having pain? No  OBJECTIVE:   TODAY'S TREATMENT:  01-06-22: Discussion on cognitive functioning, pt continues to endorse no concerns. Tells ST she feels overwhelmed when stressed by responsibility regarding others, but does not feel issues are from change in cognitive functioning. Overall, pt reporting emotional lability. Reporting anxiety, panic attacks for small events like spilling water on floor. Pt has implemented a "sanity  check" with trusted friend and received new prescription from PCP. During unstructured conversation, pt demonstrating WNL verbal expression. She is pleased with progress her speech has made. Will monitor for 1 week then pt agreeable to d/c.   01-04-22: Pt entered with grossly WNL verbal expression with occasional fading to rare pausing and halting noted in conversation. Some initial delayed processing reported, which pt feels like has improved overall. Pt does not feel concerned with cognitive functioning at this time and denied need for further evaluation. Will continue to monitor in upcoming ST sessions. Some reduced speech fluency indicated in mornings and evenings, in which SLP recommended over-articulation when fatigue occurs resulting in slurred speech. Targeted extended Q and A conversation today, in which pt noted with usual WNL fluency, rare pausing/halting, and seemingly WNL processing for responding to questions. Today, pt rated current speech as "60-70%" on scale of 100% back to baseline, which is improved compared to "5%" at initial evaluation.   12-28-21: Spontaneous, unstructured conversational sample of ~20 minutes pt presenting with verbal speech within gross normal limits. Some halting speech noted, though does not affect intelligibility. SLP reinforces positive practices pt reports to engaging in. Variable speech quality, reports sometimes speech seems "slow" but pt is compensating through self-care and slowing rate to achieve clear articulation. Readily seeking out communication opportunities. Pt engaging  in daily home practice of targeted exercises and oral readings. Led pt through oral reading task, featuring preferred topic news article. During, pt IND demonstrating slowed rate and over-articulation. No articulation errors, halting speech, or prolongations of vowels noted during ~5 minute reading. Of note, pt reports some executive dysfunction, would potentially benefit from skilled  interventions to address this cognitive deficit based on further evaluation.   12-23-21: Reportedly and notably fatigued today. Grossly WNL verbal fluency exhibited in opening conversation with occasional pausing. Completed HEP targeting verbal apraxia. Oral reading at paragraph level was notably slow and effortful. Scaffolded task for reading at sentence level with repetition to target increasing fluency with mass practice. Pt able to improve fluency of speech after 2-3 repetitions. SLP targeted naming task with usual pausing and delays noted. Usual initial phoneme prolongation exhibited at word level, which mildly improved with SLP cues and demonstration. Conversational speech c/p reduced volume and occasional reduced rate. Fatigue appeared to greatly impact performance today. Updated HEP.  12-15-21: Initiated HEP for verbal apraxia with improved rate and fluency on structured alternating syllable and short word repetition. See pt instructions     PATIENT EDUCATION: Education details: HEP, good prognosis Person educated: Patient and Spouse Education method: Explanation, Demonstration, Verbal cues, and Handouts Education comprehension: verbalized understanding, returned demonstration, verbal cues required, and needs further education     GOALS: Goals reviewed with patient? Yes   SHORT TERM GOALS: Target date: 01/12/22   Pt will complete HEP for verbal apraxia with mod I Baseline: Goal status: Met   2.  Pt will achieve WFL rate and fluency in structure task 18/20 sentences with occasional min A Baseline:  Goal status: Met    3.  Pt will achieve WFL rate with 3 or less dysfluencies over 8 minute simple conversation with occasional min A over 2 sessions Baseline: 01-04-22, 01-06-22 Goal status: Met     LONG TERM GOALS: Target date: 02/09/2022     Pt will achieve WFL rate and fluency over 18 minute complex conversation with rare min A Baseline: 01-04-22, 01-06-22 Goal status: met   2.  Pt  will participate in informal Zoom call with functional rate and 2 or less dysfluencies over 10 minute call Baseline:  Goal status: ongoing     ASSESSMENT:   CLINICAL IMPRESSION: Patient is a 56 y.o. female who was seen today for concerns re: slowed speech. 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 Speech with improving rate and less frequent halting/pausing. Continued education and training of verbal apraxia strategies to aid speech fluency. I recommend skilled ST to maximize normal rate of speech to return to PLOF and improve QOL   OBJECTIVE IMPAIRMENTS include apraxia. These impairments are limiting patient from return to work and effectively communicating at home and in community. Factors affecting potential to achieve goals and functional outcome are cooperation/participation level. Patient will benefit from skilled SLP services to address above impairments and improve overall function.   REHAB POTENTIAL: Good   PLAN: SLP FREQUENCY: 2x/week   SLP DURATION: 4 weeks   PLANNED INTERVENTIONS: Language facilitation, Environmental controls, Cueing hierachy, Internal/external aids, Functional tasks, Multimodal communication approach, and SLP instruction and feedback     Su Monks, CCC-SLP 01/06/2022, 9:33 AM

## 2022-01-06 NOTE — Therapy (Signed)
OUTPATIENT PHYSICAL THERAPY TREATMENT NOTE   Patient Name: Kimberly Zhang MRN: [id number] DOB:1965/09/10, 56 y.o., female Today's Date: 01/06/2022  PCP: Unk Pinto, MD REFERRING PROVIDER: Magda Bernheim, NP   END OF SESSION:   PT End of Session - 01/06/22 0901     Visit Number 5    Number of Visits 13   Plus eval   Date for PT Re-Evaluation 03/09/22    Authorization Type BCBS    PT Start Time 0859   Pt arrived late   PT Stop Time 0930    PT Time Calculation (min) 31 min    Activity Tolerance Patient tolerated treatment well    Behavior During Therapy [facility] for tasks assessed/performed               Past Medical History:  Diagnosis Date   Allergy    Anxiety    Asthma    Benign hematuria 2011   Ottelin   Hyperlipidemia    Migraine    Vitamin D deficiency    Past Surgical History:  Procedure Laterality Date   ABDOMINAL HYSTERECTOMY  1998   COLONOSCOPY  2018   LEEP     POLYPECTOMY     TONSILECTOMY/ADENOIDECTOMY WITH MYRINGOTOMY     Patient Active Problem List   Diagnosis Date Noted   Transient ischemic attack (TIA) 12/10/2021   B12 deficiency 04/21/2021   Obesity (BMI 30.0-34.9) 12/01/2015   Female genuine stress incontinence 10/20/2014   Benign hematuria    Hyperlipidemia    Anxiety    Asthma    Migraine    Vitamin D deficiency     REFERRING DIAG: G45.9 (ICD-10-CM) - Transient ischemic attack (TIA)  THERAPY DIAG:  Unsteadiness on feet   Other abnormalities of gait and mobility   Rationale for Evaluation and Treatment Rehabilitation  PERTINENT HISTORY: hyperlipidemia, migraines (unspecified subtype), anxiety and depression   PRECAUTIONS: Fall  SUBJECTIVE: Pt reports she is doing well, painted her kitchen cabinets and helped her husband clean out the garage. No falls, one instance of feeling as though legs were going to buckle due to fatigue but this does not concern pt. Pt has been waking up with a minor headache the past few days, but  goes away quickly. States exercises are going well, has been challenging herself by performing w/various backgrounds. States the mirror was very difficult and exacerbated her dizziness the most.   PAIN:  Are you having pain? No   OBJECTIVE: (objective measures completed at initial evaluation unless otherwise dated)  TODAY'S TREATMENT:   Ther Ex   Pt practiced floor transfer x3 w/proper technique and distant S* to imitate playing w/grandkids at home and to ensure pt is performing safely. Pt able to perform stand <>tall kneel well without UE support.   Deep squats while standing on floor mat x8, min cues to keep weight in heels as pt tends to lean forward. Progressed to 8 reps on hard floor and pt reported this is easier, noted good form without cues and adequate depth.   Staggered stance RDLs w/12# KB, x8 per side, for improved hamstring strength and dynamic balance. Pt maintained form well but reported high levels of fatigue throughout.   SciFit multi-peaks level 4 for 9 minutes w/BUE/BLEs as dynamic stretch and cool down following weight lifting to reduce risk of DOMs. Pt sprinted last minute of activity, RPE 4/10.     PATIENT EDUCATION: Education details: Continue HEP and walking program  Person educated: Patient and Spouse Education method:  Explanation and Demonstration and Handout  Education comprehension: verbalized understanding and needs further education     HOME EXERCISE PROGRAM: Access Code: [email or web address] URL: [email or web address] Date: 12/28/2021 Prepared by: Mickie Bail Taila Basinski  Exercises - Standing Single Leg Stance with Counter Support  - 1 x daily - 7 x weekly - 3 sets - 10 reps - Heel Toe Raises with Counter Support  - 1 x daily - 7 x weekly - 3 sets - 10 reps - Side Stepping with Red Resistance Band at Thighs and Counter Support  - 1 x daily - 7 x weekly - 3 sets - 10 reps - Forward Backward Monster Walk with Red Band at Thighs and Counter Support  - 1 x  daily - 7 x weekly - 3 sets - 10 reps - Marching with Red Resistance Band - 1 x daily - 7 x weekly - 3 sets - 10 reps - Forward Step Down Touch with Heel  - 1 x daily - 7 x weekly - 3 sets - 10 reps - Gaze Stability (VOR) x1 Feet Together on Firm Ground With Horizontal Head Turns  - 1 x daily - 7 x weekly - 3 sets - 10 reps - Gaze Stability (VOR) x1 Feet Together on Firm Ground With Vertical Head Turns  - 1 x daily - 7 x weekly - 3 sets - 10 reps     GOALS: Goals reviewed with patient? Yes   SHORT TERM GOALS: Target date: 01/12/2022   Pt will be independent with initial HEP for improved strength, balance, transfers and gait. Baseline: Established 12/23/2021 Goal status: INITIAL   2.  Pt will improve 5 x STS to less than or equal to 25 seconds without UE support to demonstrate improved functional strength and transfer efficiency.  Baseline: 35.21s without UE support  Goal status: INITIAL   3.  Pt will increase BERG balance score to 48/56 to demonstrate improved static balance. Baseline: 44/56 Goal status: INITIAL   4.  Pt will ambulate>350 feet on 2MWT to demonstrate improved endurance for functional tasks in home and community. Baseline: 228' Goal status: INITIAL     LONG TERM GOALS: Target date: 02/09/2022   Pt will be independent with final HEP for improved strength, balance, transfers and gait. Baseline: To be established Goal status: INITIAL   2.  Pt will improve gait velocity to at least 3.1 ft/s without AD for improved gait efficiency  Baseline: 2.5 ft/s without AD Goal status: INITIAL   3.  Pt will improve FGA to 25/30 for decreased fall risk   Baseline: 19/30 Goal status: REVISED   4.  Pt will ambulate>500 feet on 2MWT to demonstrate improved endurance for functional tasks in home and community. Baseline: See STG. Goal status: INITIAL   5.  Pt will improve 5 x STS to less than or equal to 20 seconds without UE support to demonstrate improved functional strength and  transfer efficiency.  Baseline: 35.21s without UE support  Goal status: INITIAL     ASSESSMENT:   CLINICAL IMPRESSION: Session limited due to pt arriving late. Emphasis of skilled PT session on high-intensity BLE strength and safety w/floor transfers. Pt performed floor transfer well w/min cues for stand > tall kneel sequencing and is able to perform without UE support. Pt reported significant difficulty w/squats and RDLs but performed well without need for assistance or cues for form, stating her husband has her do similar exercises at home. Continue POC.      OBJECTIVE IMPAIRMENTS Abnormal  gait, decreased activity tolerance, decreased balance, decreased endurance, decreased knowledge of use of DME, decreased mobility, difficulty walking, decreased strength, dizziness, and impaired sensation.    ACTIVITY LIMITATIONS carrying, lifting, bending, squatting, stairs, transfers, bed mobility, and locomotion level   PARTICIPATION LIMITATIONS: meal prep, cleaning, laundry, interpersonal relationship, driving, shopping, community activity, and occupation   [place] and 1 comorbidity: dizziness  are also affecting patient's functional outcome.    REHAB POTENTIAL: Good   CLINICAL DECISION MAKING: Stable/uncomplicated   EVALUATION COMPLEXITY: Low   PLAN: PT FREQUENCY:  2x/wk for 4 weeks and 1x/wk for 4 weeks   PT DURATION: 12 weeks   PLANNED INTERVENTIONS: Therapeutic exercises, Therapeutic activity, Neuromuscular re-education, Balance training, Gait training, Patient/Family education, Stair training, and DME instructions, vestibular training, canalith repositioning.   PLAN FOR NEXT SESSION: Check goals (maybe discuss DC- OT Dc'd already), How is HEP? HIIT (cross-fit style), Treadmill training, gait training outside, weighted squats (front and back), thrusters, eccentric heel taps, balance on unlevel surfaces   Shaneya Taketa E Amadeus Oyama, PT, DPT 01/06/2022, 9:30 AM

## 2022-01-07 ENCOUNTER — Telehealth: Payer: Self-pay | Admitting: Nurse Practitioner

## 2022-01-07 MED ORDER — BUPROPION HCL ER (XL) 150 MG PO TB24: ORAL_TABLET | ORAL | 0 refills | Status: DC

## 2022-01-07 NOTE — Telephone Encounter (Signed)
Pt is requesting refill on wellbutrin to be sent to pharmacy [facility] on file

## 2022-01-07 NOTE — Addendum Note (Signed)
Addended by: Chancy Hurter on: 01/07/2022 10:30 AM   Modules accepted: Orders

## 2022-01-11 ENCOUNTER — Encounter: Payer: Self-pay | Admitting: Physical Therapy

## 2022-01-11 ENCOUNTER — Encounter: Payer: BC Managed Care – PPO | Admitting: Speech Pathology

## 2022-01-11 ENCOUNTER — Ambulatory Visit: Payer: BC Managed Care – PPO | Admitting: Physical Therapy

## 2022-01-11 DIAGNOSIS — R2689 Other abnormalities of gait and mobility: Secondary | ICD-10-CM

## 2022-01-11 DIAGNOSIS — M6281 Muscle weakness (generalized): Secondary | ICD-10-CM | POA: Diagnosis not present

## 2022-01-11 DIAGNOSIS — R2681 Unsteadiness on feet: Secondary | ICD-10-CM

## 2022-01-11 NOTE — Therapy (Signed)
OUTPATIENT PHYSICAL THERAPY TREATMENT NOTE   Patient Name: Kimberly Zhang MRN: [id number] DOB:1966/03/17, 56 y.o., female Today's Date: 01/11/2022  PCP: Unk Pinto, MD REFERRING PROVIDER: Magda Bernheim, NP   END OF SESSION:   PT End of Session - 01/11/22 1047     Visit Number 6    Number of Visits 13   Plus eval   Date for PT Re-Evaluation 03/09/22    Authorization Type [employer]    PT Start Time 1044    PT Stop Time 1127    PT Time Calculation (min) 43 min    Equipment Utilized During Treatment Gait belt    Activity Tolerance Patient tolerated treatment well    Behavior During Therapy WFL for tasks assessed/performed               Past Medical History:  Diagnosis Date   Allergy    Anxiety    Asthma    Benign hematuria 2011   Ottelin   Hyperlipidemia    Migraine    Vitamin D deficiency    Past Surgical History:  Procedure Laterality Date   ABDOMINAL HYSTERECTOMY  1998   COLONOSCOPY  2018   LEEP     POLYPECTOMY     TONSILECTOMY/ADENOIDECTOMY WITH MYRINGOTOMY     Patient Active Problem List   Diagnosis Date Noted   Transient ischemic attack (TIA) 12/10/2021   B12 deficiency 04/21/2021   Obesity (BMI 30.0-34.9) 12/01/2015   Female genuine stress incontinence 10/20/2014   Benign hematuria    Hyperlipidemia    Anxiety    Asthma    Migraine    Vitamin D deficiency     REFERRING DIAG: G45.9 (ICD-10-CM) - Transient ischemic attack (TIA)  THERAPY DIAG:  Unsteadiness on feet   Other abnormalities of gait and mobility   Rationale for Evaluation and Treatment Rehabilitation  PERTINENT HISTORY: hyperlipidemia, migraines (unspecified subtype), anxiety and depression   PRECAUTIONS: Fall  SUBJECTIVE: Pt reports she has been prepping her home to sell because she would like to live in a single level home since stairs take so much of her energy.  She states a few days ago she had to be caught by 2 other people when walking in gravel as it slid and she  could not independently regain her balance.  Her posterior hip is sore from the way she went to catch herself.   PAIN:  Are you having pain? Yes: NPRS scale: 6/10 Pain location: right hip Pain description: intermittent sharp Aggravating factors: extending knee or standing Relieving factors: sitting without pressure through the right foot   OBJECTIVE: (objective measures completed at initial evaluation unless otherwise dated)  TODAY'S TREATMENT:   [facility] PT Assessment - 01/11/22 1056       Standardized Balance Assessment   Standardized Balance Assessment Berg Balance Test      Berg Balance Test   Sit to Stand Able to stand without using hands and stabilize independently    Standing Unsupported Able to stand safely 2 minutes    Sitting with Back Unsupported but Feet Supported on Floor or Stool Able to sit safely and securely 2 minutes    Stand to Sit Sits safely with minimal use of hands    Transfers Able to transfer safely, minor use of hands    Standing Unsupported with Eyes Closed Able to stand 10 seconds safely    Standing Unsupported with Feet Together Able to place feet together independently and stand 1 minute safely    From  Standing, Reach Forward with Outstretched Arm Can reach forward >5 cm safely (2")    From Standing Position, Pick up Object from Floor Able to pick up shoe safely and easily   mild dizziness   From Standing Position, Turn to Look Behind Over each Shoulder Looks behind from both sides and weight shifts well    Turn 360 Degrees Able to turn 360 degrees safely in 4 seconds or less   mild dizziness   Standing Unsupported, Alternately Place Feet on Step/Stool Able to stand independently and safely and complete 8 steps in 20 seconds    Standing Unsupported, One Foot in Front Able to plae foot ahead of the other independently and hold 30 seconds    Standing on One Leg Able to lift leg independently and hold equal to or more than 3 seconds    Total Score 51              [facility] PT Assessment - 01/11/22 1056       Standardized Balance Assessment   Standardized Balance Assessment Berg Balance Test      Berg Balance Test   Sit to Stand Able to stand without using hands and stabilize independently    Standing Unsupported Able to stand safely 2 minutes    Sitting with Back Unsupported but Feet Supported on Floor or Stool Able to sit safely and securely 2 minutes    Stand to Sit Sits safely with minimal use of hands    Transfers Able to transfer safely, minor use of hands    Standing Unsupported with Eyes Closed Able to stand 10 seconds safely    Standing Unsupported with Feet Together Able to place feet together independently and stand 1 minute safely    From Standing, Reach Forward with Outstretched Arm Can reach forward >5 cm safely (2")    From Standing Position, Pick up Object from Floor Able to pick up shoe safely and easily   mild dizziness   From Standing Position, Turn to Look Behind Over each Shoulder Looks behind from both sides and weight shifts well    Turn 360 Degrees Able to turn 360 degrees safely in 4 seconds or less   mild dizziness   Standing Unsupported, Alternately Place Feet on Step/Stool Able to stand independently and safely and complete 8 steps in 20 seconds    Standing Unsupported, One Foot in Front Able to plae foot ahead of the other independently and hold 30 seconds    Standing on One Leg Able to lift leg independently and hold equal to or more than 3 seconds    Total Score 51           Assessed 5xSTS w/o UE support:  15.44 sec Assessed 2MWT no AD:  402 ft  GAIT: Gait pattern: WFL, step through pattern, and decreased stride length Distance walked: 920 Assistive device utilized: None Level of assistance: Complete Independence Comments: Pt ambulates w/ mildly decreased speed to point of fatigue.   She exhibits no LOB and is able to maintain conversation with therapist throughout.  She reports no dizziness following  ambulation.  Pt ambulates backwards 2x15' beginning w/ CGA progressed to independently w/ modertely decreased speed and step size. Forward and retro-stepping 4x8', and lateral stepping on blue mat surface 4x8'   PATIENT EDUCATION: Education details: Continue HEP and walking program.  Progress towards goals. Person educated: Patient Education method: Customer service manager Education comprehension: verbalized understanding and needs further education     HOME  EXERCISE PROGRAM: Access Code: [email or web address] URL: [email or web address] Date: 12/28/2021 Prepared by: Mickie Bail Plaster  Exercises - Standing Single Leg Stance with Counter Support  - 1 x daily - 7 x weekly - 3 sets - 10 reps - Heel Toe Raises with Counter Support  - 1 x daily - 7 x weekly - 3 sets - 10 reps - Side Stepping with Red Resistance Band at Thighs and Counter Support  - 1 x daily - 7 x weekly - 3 sets - 10 reps - Forward Backward Monster Walk with Red Band at Thighs and Counter Support  - 1 x daily - 7 x weekly - 3 sets - 10 reps - Marching with Red Resistance Band - 1 x daily - 7 x weekly - 3 sets - 10 reps - Forward Step Down Touch with Heel  - 1 x daily - 7 x weekly - 3 sets - 10 reps - Gaze Stability (VOR) x1 Feet Together on Firm Ground With Horizontal Head Turns  - 1 x daily - 7 x weekly - 3 sets - 10 reps - Gaze Stability (VOR) x1 Feet Together on Firm Ground With Vertical Head Turns  - 1 x daily - 7 x weekly - 3 sets - 10 reps     GOALS: Goals reviewed with patient? Yes   SHORT TERM GOALS: Target date: 01/12/2022   Pt will be independent with initial HEP for improved strength, balance, transfers and gait. Baseline: Established 12/23/2021; pt is compliant to HEP and walking program Goal status: MET   2.  Pt will improve 5 x STS to less than or equal to 25 seconds without UE support to demonstrate improved functional strength and transfer efficiency.  Baseline: 35.21s without UE support; 15.44  seconds w/o UE support 01/11/2022 Goal status: MET   3.  Pt will increase BERG balance score to 48/56 to demonstrate improved static balance. Baseline: 44/56; 01/11/2022 51/56 Goal status: MET   4.  Pt will ambulate>350 feet on 2MWT to demonstrate improved endurance for functional tasks in home and community.  Baseline: 228'; 01/11/2022 402' Goal status: MET     LONG TERM GOALS: Target date: 02/09/2022   Pt will be independent with final HEP for improved strength, balance, transfers and gait. Baseline: To be established Goal status: INITIAL   2.  Pt will improve gait velocity to at least 3.1 ft/s without AD for improved gait efficiency  Baseline: 2.5 ft/s without AD Goal status: INITIAL   3.  Pt will improve FGA to 25/30 for decreased fall risk   Baseline: 19/30 Goal status: REVISED   4.  Pt will ambulate>500 feet on 2MWT to demonstrate improved endurance for functional tasks in home and community. Baseline: See STG. Goal status: INITIAL   5.  Pt will improve 5 x STS to less than or equal to 13 seconds without UE support to demonstrate improved functional strength and transfer efficiency.  Baseline: 35.21s without UE support; 15.44 sec w/o UE support 01/11/2022 Goal status: REVISED     ASSESSMENT:   CLINICAL IMPRESSION: Assessment of short term goals today with patient meeting 4 of 4 goals.  She did not feel ready to discharge at this time due to continued difficulty on outdoor surfaces including gravel which caused a recent stumble.  Gravel and grassy surfaces could not be fully addressed this visit due to weather, but will plan to further address in future visit.  She decreased her 5xSTS time to 15.44 seconds from a standard chair without  UE support.  She ambulates a total of 920' continuously prior to fatigue and 402' during 2MWT without AD or need for rest.  Her BERG score improved to 51/56.  She is returning to regular activity at home slowly, but remains limited by anxiety  around dynamic movements including backwards walking.  Will continue to assess and address deficits as able in coming sessions in preparation for discharge when appropriate.     OBJECTIVE IMPAIRMENTS Abnormal gait, decreased activity tolerance, decreased balance, decreased endurance, decreased knowledge of use of DME, decreased mobility, difficulty walking, decreased strength, dizziness, and impaired sensation.    ACTIVITY LIMITATIONS carrying, lifting, bending, squatting, stairs, transfers, bed mobility, and locomotion level   PARTICIPATION LIMITATIONS: meal prep, cleaning, laundry, interpersonal relationship, driving, shopping, community activity, and occupation   [place] and 1 comorbidity: dizziness  are also affecting patient's functional outcome.    REHAB POTENTIAL: Good   CLINICAL DECISION MAKING: Stable/uncomplicated   EVALUATION COMPLEXITY: Low   PLAN: PT FREQUENCY:  2x/wk for 4 weeks and 1x/wk for 4 weeks   PT DURATION: 12 weeks   PLANNED INTERVENTIONS: Therapeutic exercises, Therapeutic activity, Neuromuscular re-education, Balance training, Gait training, Patient/Family education, Stair training, and DME instructions, vestibular training, canalith repositioning.   PLAN FOR NEXT SESSION: Gravel, and stairs-energy efficiency, How is HEP? HIIT (cross-fit style), Treadmill training, gait training outside, weighted squats (front and back), thrusters, eccentric heel taps, balance on unlevel surfaces   Bary Richard, PT, DPT 01/11/2022, 11:33 AM

## 2022-01-12 NOTE — Therapy (Unsigned)
OUTPATIENT SPEECH LANGUAGE PATHOLOGY TREATMENT NOTE   Patient Name: Kimberly Zhang MRN: [id number] DOB:[birth year], 56 y.o., female Today's Date: 01/12/2022  PCP: Unk Pinto MD REFERRING PROVIDER: Magda Bernheim, NP   END OF SESSION:      Past Medical History:  Diagnosis Date   Allergy    Anxiety    Asthma    Benign hematuria 2011   Ottelin   Hyperlipidemia    Migraine    Vitamin D deficiency    Past Surgical History:  Procedure Laterality Date   ABDOMINAL HYSTERECTOMY  1998   COLONOSCOPY  2018   LEEP     POLYPECTOMY     TONSILECTOMY/ADENOIDECTOMY WITH MYRINGOTOMY     Patient Active Problem List   Diagnosis Date Noted   Transient ischemic attack (TIA) 12/10/2021   B12 deficiency 04/21/2021   Obesity (BMI 30.0-34.9) 12/01/2015   Female genuine stress incontinence 10/20/2014   Benign hematuria    Hyperlipidemia    Anxiety    Asthma    Migraine    Vitamin D deficiency     ONSET DATE: 12/10/21   REFERRING DIAG: G45.9 (ICD-10-CM) - Transient cerebral ischemic attack, unspecified   THERAPY DIAG: Verbal apraxia  Rationale for Evaluation and Treatment Rehabilitation  SUBJECTIVE: ***  PAIN:  Are you having pain? No  OBJECTIVE:   TODAY'S TREATMENT:  01-12-22: ***  01-06-22: Discussion on cognitive functioning, pt continues to endorse no concerns. Tells ST she feels overwhelmed when stressed by responsibility regarding others, but does not feel issues are from change in cognitive functioning. Overall, pt reporting emotional lability. Reporting anxiety, panic attacks for small events like spilling water on floor. Pt has implemented a "sanity check" with trusted friend and received new prescription from PCP. During unstructured conversation, pt demonstrating WNL verbal expression. She is pleased with progress her speech has made. Will monitor for 1 week then pt agreeable to d/c.   01-04-22: Pt entered with grossly WNL verbal expression with occasional fading  to rare pausing and halting noted in conversation. Some initial delayed processing reported, which pt feels like has improved overall. Pt does not feel concerned with cognitive functioning at this time and denied need for further evaluation. Will continue to monitor in upcoming ST sessions. Some reduced speech fluency indicated in mornings and evenings, in which SLP recommended over-articulation when fatigue occurs resulting in slurred speech. Targeted extended Q and A conversation today, in which pt noted with usual WNL fluency, rare pausing/halting, and seemingly WNL processing for responding to questions. Today, pt rated current speech as "60-70%" on scale of 100% back to baseline, which is improved compared to "5%" at initial evaluation.   12-28-21: Spontaneous, unstructured conversational sample of ~20 minutes pt presenting with verbal speech within gross normal limits. Some halting speech noted, though does not affect intelligibility. SLP reinforces positive practices pt reports to engaging in. Variable speech quality, reports sometimes speech seems "slow" but pt is compensating through self-care and slowing rate to achieve clear articulation. Readily seeking out communication opportunities. Pt engaging in daily home practice of targeted exercises and oral readings. Led pt through oral reading task, featuring preferred topic news article. During, pt IND demonstrating slowed rate and over-articulation. No articulation errors, halting speech, or prolongations of vowels noted during ~5 minute reading. Of note, pt reports some executive dysfunction, would potentially benefit from skilled interventions to address this cognitive deficit based on further evaluation.   12-23-21: Reportedly and notably fatigued today. Grossly WNL verbal fluency exhibited in opening conversation  with occasional pausing. Completed HEP targeting verbal apraxia. Oral reading at paragraph level was notably slow and effortful. Scaffolded  task for reading at sentence level with repetition to target increasing fluency with mass practice. Pt able to improve fluency of speech after 2-3 repetitions. SLP targeted naming task with usual pausing and delays noted. Usual initial phoneme prolongation exhibited at word level, which mildly improved with SLP cues and demonstration. Conversational speech c/p reduced volume and occasional reduced rate. Fatigue appeared to greatly impact performance today. Updated HEP.  12-15-21: Initiated HEP for verbal apraxia with improved rate and fluency on structured alternating syllable and short word repetition. See pt instructions     PATIENT EDUCATION: Education details: HEP, good prognosis Person educated: Patient and Spouse Education method: Explanation, Demonstration, Verbal cues, and Handouts Education comprehension: verbalized understanding, returned demonstration, verbal cues required, and needs further education     GOALS: Goals reviewed with patient? Yes   SHORT TERM GOALS: Target date: 01/12/22   Pt will complete HEP for verbal apraxia with mod I Baseline: Goal status: Met   2.  Pt will achieve WFL rate and fluency in structure task 18/20 sentences with occasional min A Baseline:  Goal status: Met    3.  Pt will achieve WFL rate with 3 or less dysfluencies over 8 minute simple conversation with occasional min A over 2 sessions Baseline: 01-04-22, 01-06-22 Goal status: Met     LONG TERM GOALS: Target date: 02/09/2022     Pt will achieve WFL rate and fluency over 18 minute complex conversation with rare min A Baseline: 01-04-22, 01-06-22 Goal status: met   2.  Pt will participate in informal Zoom call with functional rate and 2 or less dysfluencies over 10 minute call Baseline:  Goal status: ongoing     ASSESSMENT:   CLINICAL IMPRESSION: Patient is a 56 y.o. female who was seen today for concerns re: slowed speech. 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 Speech with improving rate and less frequent halting/pausing. Continued education and training of verbal apraxia strategies to aid speech fluency. I recommend skilled ST to maximize normal rate of speech to return to PLOF and improve QOL   OBJECTIVE IMPAIRMENTS include apraxia. These impairments are limiting patient from return to work and effectively communicating at home and in community. Factors affecting potential to achieve goals and functional outcome are cooperation/participation level. Patient will benefit from skilled SLP services to address above impairments and improve overall function.   REHAB POTENTIAL: Good   PLAN: SLP FREQUENCY: 2x/week   SLP DURATION: 4 weeks   PLANNED INTERVENTIONS: Language facilitation, Environmental controls, Cueing hierachy, Internal/external aids, Functional tasks, Multimodal communication approach, and SLP instruction and feedback     Su Monks, CCC-SLP 01/12/2022, 3:43 PM

## 2022-01-12 NOTE — Progress Notes (Signed)
Assessment and Plan:  Kimberly Zhang was seen today for follow-up.  Diagnoses and all orders for this visit:  Transient ischemic attack (TIA) Continue PT and follow up with Neurology Long discussion regarding her job- written out for the next month until evaluated by neurology and PT is completed  Anxiety Continue Lexapro, begin Buspar 5 mg TID PRN Given the names of several therapists and strongly encouraged to schedyle appointment  -     busPIRone (BUSPAR) 5 MG tablet; Take 1 tablet (5 mg total) by mouth 3 (three) times daily.  Aphasia Completed ST today Continue to work on exercises and follow with neurology  Right sided weakness Continue PT and follow with neurology      Further disposition pending results of labs. Discussed med's effects and SE's.   Over 30 minutes of exam, counseling, chart review, and critical decision making was performed.   Future Appointments  Date Time Provider [place]  01/18/2022  8:45 AM Plaster, Cruzita Lederer, PT [facility]  01/18/2022 10:30 AM Penumalli, Earlean Polka, MD [facility] None  01/20/2022  8:45 AM Arliss Journey, PT [facility]  03/04/2022  3:15 PM Pixie Casino, MD [facility]  06/03/2022 10:00 AM Alycia Rossetti, NP [facility] None    ------------------------------------------------------------------------------------------------------------------   HPI BP 110/76   Pulse 64   Temp 97.6 F (36.4 C)   Wt 173 lb (78.5 kg)   SpO2 99%   BMI 30.89 kg/m   55 y.o.female presents for reevaluation s/p TIA with deficits. She is discharged from speech and OT.  She has a follow up appointment with neurology.Her balance remains impaired and continues to work with PT   She is having increased anxiety to the point of having palpitations. Small things such as spilling water will put her in a panic attack. She gets more difficulties with speech when she is more fatigued.    She is currently written off work.  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  She was responsible for reading doctors notes, coordinating referrals. She is unable to function currently in this role. This role was very high stress.  BMI is Body mass index is 30.89 kg/m., she has been working on diet and exercise. Wt Readings from Last 3 Encounters:  01/13/22 173 lb (78.5 kg)  12/14/21 171 lb 6.4 oz (77.7 kg)  12/10/21 175 lb 14.8 oz (79.8 kg)   She is not currently on medication and blood pressure is well controlled.  BP Readings from Last 3 Encounters:  01/13/22 110/76  12/28/21 (!) 89/65  12/14/21 100/62     Past Medical History:  Diagnosis Date   Allergy    Anxiety    Asthma    Benign hematuria 2011   Ottelin   Hyperlipidemia    Migraine    Vitamin D deficiency      Allergies  Allergen Reactions   Shellfish Allergy Anaphylaxis   Shellfish-Derived Products Anaphylaxis   Atorvastatin     Severe nausea   Rosuvastatin     Nausea, GI    Current Outpatient Medications on File Prior to Visit  Medication Sig   aspirin EC 81 MG tablet Take 1 tablet (81 mg total) by mouth daily. Swallow whole.   buPROPion (WELLBUTRIN XL) 150 MG 24 hr tablet Take one tablet daily   escitalopram (LEXAPRO) 20 MG tablet TAKE 1 TABLET BY MOUTH DAILY FOR MOOD (Patient taking differently: Take 20 mg by mouth daily.)  levocetirizine (XYZAL) 5 MG tablet TAKE 1 TABLET BY MOUTH DAILY FOR ALLERGIES (Patient taking differently: Take 5 mg by mouth daily.)   topiramate (TOPAMAX) 100 MG tablet Take 1 tablet  at Bedtime  for Migraine Prevention & Weight Loss (Patient taking differently: Take 100 mg by mouth at bedtime.)   UBRELVY 50 MG TABS TAKE 1 TABLET BY MOUTH AS NEEDED   Vitamin D, Ergocalciferol, (DRISDOL) 1.25 MG (50000 UNIT) CAPS capsule TAKE 1 CAPSULE BY MOUTH 3 DAYS A WEEK (Patient taking differently: Take 50,000 Units by mouth. TAKE 1 CAPSULE BY MOUTH 3 DAYS A WEEK)   No current facility-administered  medications on file prior to visit.    ROS: all negative except above.   Physical Exam:  BP 110/76   Pulse 64   Temp 97.6 F (36.4 C)   Wt 173 lb (78.5 kg)   SpO2 99%   BMI 30.89 kg/m   General Appearance: Well nourished, in no apparent distress. Mood more stable, minimal crying on exam today Eyes: PERRLA, EOMs, conjunctiva no swelling or erythema Sinuses: No Frontal/maxillary tenderness ENT/Mouth: Ext aud canals clear, TMs without erythema, bulging. No erythema, swelling, or exudate on post pharynx.  Tonsils not swollen or erythematous. Hearing normal.  Neck: Supple, thyroid normal.  Respiratory: Respiratory effort normal, BS equal bilaterally without rales, rhonchi, wheezing or stridor.  Cardio: RRR with no MRGs. Brisk peripheral pulses without edema.  Abdomen: Soft, + BS.  Non tender, no guarding, rebound, hernias, masses. Lymphatics: Non tender without lymphadenopathy.  Musculoskeletal: Full ROM, slow steady gait. Right upper and lower extremity weakness 4/5 Skin: Warm, dry without rashes, lesions, ecchymosis.  Neuro: Cranial nerves intact. Decreased muscle tone right arm and leg.. Sensation intact.  Aphasia is much less pronounced Psych: Awake and oriented X 3, normal affect, Insight and Judgment appropriate.     Alycia Rossetti, NP 10:42 AM [name]

## 2022-01-13 ENCOUNTER — Ambulatory Visit (INDEPENDENT_AMBULATORY_CARE_PROVIDER_SITE_OTHER): Payer: BC Managed Care – PPO | Admitting: Nurse Practitioner

## 2022-01-13 ENCOUNTER — Ambulatory Visit: Payer: BC Managed Care – PPO | Admitting: Physical Therapy

## 2022-01-13 ENCOUNTER — Encounter: Payer: Self-pay | Admitting: Nurse Practitioner

## 2022-01-13 ENCOUNTER — Ambulatory Visit: Payer: BC Managed Care – PPO | Admitting: Speech Pathology

## 2022-01-13 ENCOUNTER — Encounter: Payer: BC Managed Care – PPO | Admitting: Occupational Therapy

## 2022-01-13 VITALS — BP 110/76 | HR 64 | Temp 97.6°F | Wt 173.0 lb

## 2022-01-13 DIAGNOSIS — R4701 Aphasia: Secondary | ICD-10-CM | POA: Diagnosis not present

## 2022-01-13 DIAGNOSIS — R2689 Other abnormalities of gait and mobility: Secondary | ICD-10-CM

## 2022-01-13 DIAGNOSIS — R531 Weakness: Secondary | ICD-10-CM | POA: Diagnosis not present

## 2022-01-13 DIAGNOSIS — R482 Apraxia: Secondary | ICD-10-CM

## 2022-01-13 DIAGNOSIS — R2681 Unsteadiness on feet: Secondary | ICD-10-CM

## 2022-01-13 DIAGNOSIS — F419 Anxiety disorder, unspecified: Secondary | ICD-10-CM

## 2022-01-13 DIAGNOSIS — G459 Transient cerebral ischemic attack, unspecified: Secondary | ICD-10-CM

## 2022-01-13 DIAGNOSIS — M6281 Muscle weakness (generalized): Secondary | ICD-10-CM | POA: Diagnosis not present

## 2022-01-13 MED ORDER — BUSPIRONE HCL 5 MG PO TABS: 5.0000 mg | ORAL_TABLET | Freq: Three times a day (TID) | ORAL | 3 refills | Status: DC

## 2022-01-13 NOTE — Patient Instructions (Addendum)
Therapists I recommend : Arbutus Leas PhD [address] [place] [address] ph [place], [place] [address] [address] [address] [phone]   Buspirone Tablets What is this medication? BUSPIRONE (byoo SPYE rone) treats anxiety. It works by balancing the levels of dopamine and serotonin in your brain, hormones that help regulate mood. This medicine may be used for other purposes; ask your health care provider or pharmacist if you have questions. COMMON BRAND NAME(S): BuSpar, Buspar Dividose What should I tell my care team before I take this medication? They need to know if you have any of these conditions: Kidney or liver disease An unusual or allergic reaction to buspirone, other medications, foods, dyes, or preservatives Pregnant or trying to get pregnant Breast-feeding How should I use this medication? Take this medication by mouth with a glass of water. Follow the directions on the prescription label. You may take this medication with or without food. To ensure that this medication always works the same way for you, you should take it either always with or always without food. Take your doses at regular intervals. Do not take your medication more often than directed. Do not stop taking except on the advice of your care team. Talk to your care team about the use of this medication in children. Special care may be needed. Overdosage: If you think you have taken too much of this medicine contact a poison control center or emergency room at once. NOTE: This medicine is only for you. Do not share this medicine with others. What if I miss a dose? If you miss a dose, take it as soon as you can. If it is almost time for your next dose, take only that dose. Do not take double or extra doses. What may interact with this medication? Do not take this medication with any of the  following: Linezolid MAOIs like Carbex, Eldepryl, Marplan, Nardil, and Parnate Methylene blue Procarbazine This medication may also interact with the following: Diazepam Digoxin Diltiazem Erythromycin Grapefruit juice Haloperidol Medications for mental depression or mood problems Medications for seizures like carbamazepine, phenobarbital and phenytoin Nefazodone Other medications for anxiety Rifampin Ritonavir Some antifungal medications like itraconazole, ketoconazole, and voriconazole Verapamil Warfarin This list may not describe all possible interactions. Give your health care provider a list of all the medicines, herbs, non-prescription drugs, or dietary supplements you use. Also tell them if you smoke, drink alcohol, or use illegal drugs. Some items may interact with your medicine. What should I watch for while using this medication? Visit your care team for regular checks on your progress. It may take 1 to 2 weeks before your anxiety gets better. You may get drowsy or dizzy. Do not drive, use machinery, or do anything that needs mental alertness until you know how this medication affects you. Do not stand or sit up quickly, especially if you are an older patient. This reduces the risk of dizzy or fainting spells. Alcohol can make you more drowsy and dizzy. Avoid alcoholic drinks. What side effects may I notice from receiving this medication? Side effects that you should report to your care team as soon as possible: Allergic reactions--skin rash, itching, hives, swelling of the face, lips, tongue, or throat Irritability, confusion, fast or irregular heartbeat, muscle stiffness, twitching muscles, sweating, high fever, seizure, chills, vomiting, diarrhea, which may be signs of serotonin syndrome Side effects that usually do not require medical  attention (report to your care team if they continue or are bothersome): Anxiety or  nervousness Dizziness Drowsiness Headache Nausea Trouble sleeping This list may not describe all possible side effects. Call your doctor for medical advice about side effects. You may report side effects to FDA at 1-800-FDA-1088. Where should I keep my medication? Keep out of the reach of children. Store at room temperature below 30 degrees C (86 degrees F). Protect from light. Keep container tightly closed. Throw away any unused medication after the expiration date. NOTE: This sheet is a summary. It may not cover all possible information. If you have questions about this medicine, talk to your doctor, pharmacist, or health care provider.  2023 Elsevier/Gold Standard (2020-10-08 00:00:00)

## 2022-01-13 NOTE — Therapy (Signed)
OUTPATIENT PHYSICAL THERAPY TREATMENT NOTE   Patient Name: Kimberly Zhang MRN: [id number] DOB:1965-09-05, 56 y.o., female Today's Date: 01/13/2022  PCP: Unk Pinto, MD REFERRING PROVIDER: Magda Bernheim, NP   END OF SESSION:   PT End of Session - 01/13/22 0847     Visit Number 7    Number of Visits 13   Plus eval   Date for PT Re-Evaluation 03/09/22    Authorization Type BCBS    PT Start Time 0847    PT Stop Time 0928    PT Time Calculation (min) 41 min    Equipment Utilized During Treatment Gait belt    Activity Tolerance Patient tolerated treatment well    Behavior During Therapy [facility] for tasks assessed/performed                Past Medical History:  Diagnosis Date   Allergy    Anxiety    Asthma    Benign hematuria 2011   Ottelin   Hyperlipidemia    Migraine    Vitamin D deficiency    Past Surgical History:  Procedure Laterality Date   ABDOMINAL HYSTERECTOMY  1998   COLONOSCOPY  2018   LEEP     POLYPECTOMY     TONSILECTOMY/ADENOIDECTOMY WITH MYRINGOTOMY     Patient Active Problem List   Diagnosis Date Noted   Transient ischemic attack (TIA) 12/10/2021   B12 deficiency 04/21/2021   Obesity (BMI 30.0-34.9) 12/01/2015   Female genuine stress incontinence 10/20/2014   Benign hematuria    Hyperlipidemia    Anxiety    Asthma    Migraine    Vitamin D deficiency     REFERRING DIAG: G45.9 (ICD-10-CM) - Transient ischemic attack (TIA)  THERAPY DIAG:  Unsteadiness on feet   Other abnormalities of gait and mobility   Rationale for Evaluation and Treatment Rehabilitation  PERTINENT HISTORY: hyperlipidemia, migraines (unspecified subtype), anxiety and depression   PRECAUTIONS: Fall  SUBJECTIVE: Pt reports she is still embarrassed from her fall on gravel earlier this week. Exercises are going well, other than walking backwards.   PAIN:  Are you having pain? No   OBJECTIVE: (objective measures completed at initial evaluation unless  otherwise dated)  TODAY'S TREATMENT:    Gait Training  The following treadmill training was completed for aerobic/neural priming, endurance, and gait speed. BUE support throughout - Warmup: 2:00 up to 2.3 mph - HIIT: 5:00 30sec ON/OFF alternating 3.5 / 2.3 mph   - Cool down: 90 sec at 1.8 mph   Pt required seated rest break between trials   The following treadmill training was completed for aerobic priming, endurance, and BLE strength. Pt switched between BUE and unilateral UE support  - Warmup: 2:00 up to 2.2 mph - Hill training: 6:00 every minute increasing the incline by 1% until reaching 7% grade at 2.2 mph, starting at 1% - Cool down: 60 sec at 0% incline and 2.2 mph    NMR  Blue mat w/various obstacles underneath (gum drops, dynadisc, stepping stones) for dynamic walking balance, reactive balance strategies and ankle strategy practice. Pt ambulated down and back mat x2 w/CGA-min A, occasionally reaching out for rail in //bars to recover balance. Noted pt tends to crouch down when she feels unsteady, bracing her BLEs against each other. However, no major LOB noted throughout.    PATIENT EDUCATION: Education details: Continue HEP and walking program.   Person educated: Patient Education method: Customer service manager Education comprehension: verbalized understanding and needs further education  HOME EXERCISE PROGRAM: Access Code: [email or web address] URL: [email or web address] Date: 12/28/2021 Prepared by: Mickie Bail Terri Malerba  Exercises - Standing Single Leg Stance with Counter Support  - 1 x daily - 7 x weekly - 3 sets - 10 reps - Heel Toe Raises with Counter Support  - 1 x daily - 7 x weekly - 3 sets - 10 reps - Side Stepping with Red Resistance Band at Thighs and Counter Support  - 1 x daily - 7 x weekly - 3 sets - 10 reps - Forward Backward Monster Walk with Red Band at Thighs and Counter Support  - 1 x daily - 7 x weekly - 3 sets - 10 reps - Marching with Red  Resistance Band - 1 x daily - 7 x weekly - 3 sets - 10 reps - Forward Step Down Touch with Heel  - 1 x daily - 7 x weekly - 3 sets - 10 reps - Gaze Stability (VOR) x1 Feet Together on Firm Ground With Horizontal Head Turns  - 1 x daily - 7 x weekly - 3 sets - 10 reps - Gaze Stability (VOR) x1 Feet Together on Firm Ground With Vertical Head Turns  - 1 x daily - 7 x weekly - 3 sets - 10 reps     GOALS: Goals reviewed with patient? Yes   SHORT TERM GOALS: Target date: 01/12/2022   Pt will be independent with initial HEP for improved strength, balance, transfers and gait. Baseline: Established 12/23/2021; pt is compliant to HEP and walking program Goal status: MET   2.  Pt will improve 5 x STS to less than or equal to 25 seconds without UE support to demonstrate improved functional strength and transfer efficiency.  Baseline: 35.21s without UE support; 15.44 seconds w/o UE support 01/11/2022 Goal status: MET   3.  Pt will increase BERG balance score to 48/56 to demonstrate improved static balance. Baseline: 44/56; 01/11/2022 51/56 Goal status: MET   4.  Pt will ambulate>350 feet on 2MWT to demonstrate improved endurance for functional tasks in home and community.  Baseline: 228'; 01/11/2022 402' Goal status: MET     LONG TERM GOALS: Target date: 02/09/2022   Pt will be independent with final HEP for improved strength, balance, transfers and gait. Baseline: To be established Goal status: INITIAL   2.  Pt will improve gait velocity to at least 3.1 ft/s without AD for improved gait efficiency  Baseline: 2.5 ft/s without AD Goal status: INITIAL   3.  Pt will improve FGA to 25/30 for decreased fall risk   Baseline: 19/30 Goal status: REVISED   4.  Pt will ambulate>500 feet on 2MWT to demonstrate improved endurance for functional tasks in home and community. Baseline: See STG. Goal status: INITIAL   5.  Pt will improve 5 x STS to less than or equal to 13 seconds without UE support to  demonstrate improved functional strength and transfer efficiency.  Baseline: 35.21s without UE support; 15.44 sec w/o UE support 01/11/2022 Goal status: REVISED     ASSESSMENT:   CLINICAL IMPRESSION: Emphasis of skilled PT session on gait training for increased endurance and BLE strength and reactive balance strategies. Pt performed well on treadmill but continues to exhibit fear-avoidance behavior following her recent fall in gravel. Unable to practice balance in grass/gravel today due to weather, but created unstable mat surface for pt to ambulate on inside clinic to challenge ankle strategy and reactive balance. Pt very hesitant to perform initially but maintained balance well  with little assistance. Continue POC.      OBJECTIVE IMPAIRMENTS Abnormal gait, decreased activity tolerance, decreased balance, decreased endurance, decreased knowledge of use of DME, decreased mobility, difficulty walking, decreased strength, dizziness, and impaired sensation.    ACTIVITY LIMITATIONS carrying, lifting, bending, squatting, stairs, transfers, bed mobility, and locomotion level   PARTICIPATION LIMITATIONS: meal prep, cleaning, laundry, interpersonal relationship, driving, shopping, community activity, and occupation   [place] and 1 comorbidity: dizziness  are also affecting patient's functional outcome.    REHAB POTENTIAL: Good   CLINICAL DECISION MAKING: Stable/uncomplicated   EVALUATION COMPLEXITY: Low   PLAN: PT FREQUENCY:  2x/wk for 4 weeks and 1x/wk for 4 weeks   PT DURATION: 12 weeks   PLANNED INTERVENTIONS: Therapeutic exercises, Therapeutic activity, Neuromuscular re-education, Balance training, Gait training, Patient/Family education, Stair training, and DME instructions, vestibular training, canalith repositioning.   PLAN FOR NEXT SESSION: Gravel, and stairs-energy efficiency, How is HEP? HIIT (cross-fit style), Treadmill training, gait training outside, weighted  squats (front and back), thrusters, eccentric heel taps, balance on unlevel surfaces   Annamay Laymon E Sheree Lalla, PT, DPT 01/13/2022, 10:59 AM

## 2022-01-18 ENCOUNTER — Ambulatory Visit: Payer: BC Managed Care – PPO | Admitting: Physical Therapy

## 2022-01-18 ENCOUNTER — Ambulatory Visit: Payer: BC Managed Care – PPO | Admitting: Diagnostic Neuroimaging

## 2022-01-18 ENCOUNTER — Encounter: Payer: Self-pay | Admitting: Diagnostic Neuroimaging

## 2022-01-18 ENCOUNTER — Encounter: Payer: BC Managed Care – PPO | Admitting: Speech Pathology

## 2022-01-18 VITALS — BP 126/73 | HR 73 | Ht 63.0 in | Wt 173.0 lb

## 2022-01-18 VITALS — BP 126/70

## 2022-01-18 DIAGNOSIS — M6281 Muscle weakness (generalized): Secondary | ICD-10-CM | POA: Diagnosis not present

## 2022-01-18 DIAGNOSIS — F430 Acute stress reaction: Secondary | ICD-10-CM

## 2022-01-18 DIAGNOSIS — R2689 Other abnormalities of gait and mobility: Secondary | ICD-10-CM

## 2022-01-18 DIAGNOSIS — R2681 Unsteadiness on feet: Secondary | ICD-10-CM

## 2022-01-18 DIAGNOSIS — G459 Transient cerebral ischemic attack, unspecified: Secondary | ICD-10-CM | POA: Diagnosis not present

## 2022-01-18 NOTE — Therapy (Signed)
OUTPATIENT PHYSICAL THERAPY TREATMENT NOTE   Patient Name: Kimberly Zhang MRN: [id number] DOB:1965/09/14, 56 y.o., female Today's Date: 01/18/2022  PCP: Lucky Cowboy, MD REFERRING PROVIDER: Revonda Humphrey, NP   END OF SESSION:   PT End of Session - 01/18/22 0848     Visit Number 8    Number of Visits 13   Plus eval   Date for PT Re-Evaluation 03/09/22    Authorization Type BCBS    PT Start Time 0845    PT Stop Time 0926    PT Time Calculation (min) 41 min    Equipment Utilized During Treatment Gait belt    Activity Tolerance Patient tolerated treatment well    Behavior During Therapy [facility] for tasks assessed/performed;Anxious                 Past Medical History:  Diagnosis Date   Allergy    Anxiety    Asthma    Benign hematuria 2011   Ottelin   Hyperlipidemia    Migraine    Vitamin D deficiency    Past Surgical History:  Procedure Laterality Date   ABDOMINAL HYSTERECTOMY  1998   COLONOSCOPY  2018   LEEP     POLYPECTOMY     TONSILECTOMY/ADENOIDECTOMY WITH MYRINGOTOMY     Patient Active Problem List   Diagnosis Date Noted   Transient ischemic attack (TIA) 12/10/2021   B12 deficiency 04/21/2021   Obesity (BMI 30.0-34.9) 12/01/2015   Female genuine stress incontinence 10/20/2014   Benign hematuria    Hyperlipidemia    Anxiety    Asthma    Migraine    Vitamin D deficiency     REFERRING DIAG: G45.9 (ICD-10-CM) - Transient ischemic attack (TIA)  THERAPY DIAG:  Unsteadiness on feet   Other abnormalities of gait and mobility   Rationale for Evaluation and Treatment Rehabilitation  PERTINENT HISTORY: hyperlipidemia, migraines (unspecified subtype), anxiety and depression   PRECAUTIONS: Fall  SUBJECTIVE: Pt reports she feels "off". Had a fall since last visit, but cannot remember what happened. Started taking medicine for anxiety, but states she is very shaky. Still having dizziness. Speech is more robotic today compared to previous sessions.    PAIN:  Are you having pain? No  Vitals:   01/18/22 0853  BP: 126/70      OBJECTIVE: (objective measures completed at initial evaluation unless otherwise dated)  TODAY'S TREATMENT:   Pt ambulated into clinic demonstrating very guarded gait pattern (similar to eval) and reduced cadence.   Ther Ex  SciFit multi-peaks level 7 for 8 minutes using BUE/BLEs for dynamic cardiovascular warmup, endurance and neural priming for reciprocal movement. Pt demonstrating reduced amplitude and velocity of movement today compared to previous sessions.  Gait Training  Gait pattern: step through pattern, decreased arm swing- Right, decreased arm swing- Left, and narrow BOS Distance walked: >500' outside on various surfaces (gravel and grass) Assistive device utilized: None Level of assistance: Modified independence and SBA Comments: Practiced ambulating in gravel (fwd and retro), as pt very fearful of falling/walking in gravel. Pt very guarded initially, provided mod cues for diaphragmatic breathing and to relax BUEs. Pt quickly relaxed and ambulated in gravel pit x5 times fwd and retro mod I (due to slow cadence). Progressed to fwd/retro gait in grass. Pt mod I w/fwd gait. W/retro gait, noted significant scissoring of BLEs and over supination of RLE, requiring mod verbal cues for proper foot placement and single instance of min A due to posterior LOB. Pt able to  correct foot placement w/cues and completed rest of retro gait in grass mod I.    PATIENT EDUCATION: Education details: Plan to DC next session, education on impact of proper foot placement/BOS on balance  Person educated: Patient Education method: Explanation and Demonstration Education comprehension: verbalized understanding and needs further education     HOME EXERCISE PROGRAM: Access Code: [email or web address] URL: [email or web address] Date: 12/28/2021 Prepared by: Alethia Berthold Danetta Prom  Exercises - Standing Single Leg Stance with  Counter Support  - 1 x daily - 7 x weekly - 3 sets - 10 reps - Heel Toe Raises with Counter Support  - 1 x daily - 7 x weekly - 3 sets - 10 reps - Side Stepping with Red Resistance Band at Thighs and Counter Support  - 1 x daily - 7 x weekly - 3 sets - 10 reps - Forward Backward Monster Walk with Red Band at Thighs and Counter Support  - 1 x daily - 7 x weekly - 3 sets - 10 reps - Marching with Red Resistance Band - 1 x daily - 7 x weekly - 3 sets - 10 reps - Forward Step Down Touch with Heel  - 1 x daily - 7 x weekly - 3 sets - 10 reps - Gaze Stability (VOR) x1 Feet Together on Firm Ground With Horizontal Head Turns  - 1 x daily - 7 x weekly - 3 sets - 10 reps - Gaze Stability (VOR) x1 Feet Together on Firm Ground With Vertical Head Turns  - 1 x daily - 7 x weekly - 3 sets - 10 reps     GOALS: Goals reviewed with patient? Yes   SHORT TERM GOALS: Target date: 01/12/2022   Pt will be independent with initial HEP for improved strength, balance, transfers and gait. Baseline: Established 12/23/2021; pt is compliant to HEP and walking program Goal status: MET   2.  Pt will improve 5 x STS to less than or equal to 25 seconds without UE support to demonstrate improved functional strength and transfer efficiency.  Baseline: 35.21s without UE support; 15.44 seconds w/o UE support 01/11/2022 Goal status: MET   3.  Pt will increase BERG balance score to 48/56 to demonstrate improved static balance. Baseline: 44/56; 01/11/2022 51/56 Goal status: MET   4.  Pt will ambulate>350 feet on to demonstrate improved endurance for functional tasks in home and community.  Baseline: 228'; 01/11/2022 402' Goal status: MET     LONG TERM GOALS: Target date: 02/09/2022   Pt will be independent with final HEP for improved strength, balance, transfers and gait. Baseline: To be established Goal status: INITIAL   2.  Pt will improve gait velocity to at least 3.1 ft/s without AD for improved gait efficiency   Baseline: 2.5 ft/s without AD Goal status: INITIAL   3.  Pt will improve FGA to 25/30 for decreased fall risk   Baseline: 19/30 Goal status: REVISED   4.  Pt will ambulate>500 feet on to demonstrate improved endurance for functional tasks in home and community. Baseline: See STG. Goal status: INITIAL   5.  Pt will improve 5 x STS to less than or equal to 13 seconds without UE support to demonstrate improved functional strength and transfer efficiency.  Baseline: 35.21s without UE support; 15.44 sec w/o UE support 01/11/2022 Goal status: REVISED     ASSESSMENT:   CLINICAL IMPRESSION: Emphasis of skilled PT session on gait training on unlevel surfaces. Pt seemingly more anxious during session today  but was able to relax following activity. Pt demonstrates fear-avoidance behavior w/gait on unlevel surfaces but responds well to encouraging cues. Pt had single LOB w/retro gait on grass due to narrow foot placement, but was otherwise able to complete activity mod I. Pt in agreement to DC from therapy next session. Continue POC.      OBJECTIVE IMPAIRMENTS Abnormal gait, decreased activity tolerance, decreased balance, decreased endurance, decreased knowledge of use of DME, decreased mobility, difficulty walking, decreased strength, dizziness, and impaired sensation.    ACTIVITY LIMITATIONS carrying, lifting, bending, squatting, stairs, transfers, bed mobility, and locomotion level   PARTICIPATION LIMITATIONS: meal prep, cleaning, laundry, interpersonal relationship, driving, shopping, community activity, and occupation   PERSONAL FACTORS Fitness and 1 comorbidity: dizziness  are also affecting patient's functional outcome.    REHAB POTENTIAL: Good   CLINICAL DECISION MAKING: Stable/uncomplicated   EVALUATION COMPLEXITY: Low   PLAN: PT FREQUENCY:  2x/wk for 4 weeks and 1x/wk for 4 weeks   PT DURATION: 12 weeks   PLANNED INTERVENTIONS: Therapeutic exercises, Therapeutic  activity, Neuromuscular re-education, Balance training, Gait training, Patient/Family education, Stair training, and DME instructions, vestibular training, canalith repositioning.   PLAN FOR NEXT SESSION: Goals, DC. Gravel, and stairs-energy efficiency, How is HEP? HIIT (cross-fit style), Treadmill training, gait training outside, weighted squats (front and back), thrusters, eccentric heel taps, balance on unlevel surfaces   Malikhi Ogan E Lillar Bianca, PT, DPT 01/18/2022, 9:42 AM

## 2022-01-20 ENCOUNTER — Encounter: Payer: BC Managed Care – PPO | Admitting: Occupational Therapy

## 2022-01-20 ENCOUNTER — Ambulatory Visit: Payer: BC Managed Care – PPO | Admitting: Physical Therapy

## 2022-01-20 ENCOUNTER — Encounter: Payer: BC Managed Care – PPO | Admitting: Speech Pathology

## 2022-01-20 DIAGNOSIS — M6281 Muscle weakness (generalized): Secondary | ICD-10-CM

## 2022-01-20 DIAGNOSIS — R2689 Other abnormalities of gait and mobility: Secondary | ICD-10-CM

## 2022-01-20 DIAGNOSIS — R2681 Unsteadiness on feet: Secondary | ICD-10-CM

## 2022-01-20 NOTE — Therapy (Addendum)
OUTPATIENT PHYSICAL THERAPY TREATMENT NOTE- DISCHARGE SUMMARY    Patient Name: Kimberly Zhang MRN: [id number] DOB:1965/09/23, 56 y.o., female Today's Date: 01/20/2022  PCP: Unk Pinto, MD REFERRING PROVIDER: Magda Bernheim, NP   PHYSICAL THERAPY DISCHARGE SUMMARY  Visits from Start of Care: 9  Current functional level related to goals / functional outcomes: See below    Remaining deficits: Decreased endurance, mild dizziness w/vertical head turns    Education / Equipment: HEP   Patient agrees to discharge. Patient goals were met. Patient is being discharged due to meeting the stated rehab goals.   END OF SESSION:   PT End of Session - 01/20/22 0854     Visit Number 9    Number of Visits 13   Plus eval   Date for PT Re-Evaluation 03/09/22    Authorization Type BCBS    PT Start Time [phone]   Pt arrived late   PT Stop Time 0918    PT Time Calculation (min) 26 min    Activity Tolerance Patient tolerated treatment well    Behavior During Therapy [facility] for tasks assessed/performed;Anxious                 Past Medical History:  Diagnosis Date   Allergy    Anxiety    Asthma    Benign hematuria 2011   Ottelin   Hyperlipidemia    Migraine    Vitamin D deficiency    Past Surgical History:  Procedure Laterality Date   ABDOMINAL HYSTERECTOMY  1998   COLONOSCOPY  2018   LEEP     POLYPECTOMY     TONSILECTOMY/ADENOIDECTOMY WITH MYRINGOTOMY     Patient Active Problem List   Diagnosis Date Noted   Transient ischemic attack (TIA) 12/10/2021   B12 deficiency 04/21/2021   Obesity (BMI 30.0-34.9) 12/01/2015   Female genuine stress incontinence 10/20/2014   Benign hematuria    Hyperlipidemia    Anxiety    Asthma    Migraine    Vitamin D deficiency     REFERRING DIAG: G45.9 (ICD-10-CM) - Transient ischemic attack (TIA)  THERAPY DIAG:  Unsteadiness on feet   Other abnormalities of gait and mobility   Rationale for Evaluation and Treatment  Rehabilitation  PERTINENT HISTORY: hyperlipidemia, migraines (unspecified subtype), anxiety and depression   PRECAUTIONS: Fall  SUBJECTIVE: Pt doing well, reports she is walking more. Excited to DC from therapy today.   PAIN:  Are you having pain? No  There were no vitals filed for this visit.     OBJECTIVE: (objective measures completed at initial evaluation unless otherwise dated)  TODAY'S TREATMENT:   Ther Act LTG assessment     [facility] PT Assessment - 01/20/22 0859       Transfers   Five time sit to stand comments  10.57s without UE support      Ambulation/Gait   Ambulation/Gait Yes    Ambulation/Gait Assistance 6: Modified independent (Device/Increase time)    Ambulation Distance (Feet) 465 Feet   2MWT   Assistive device None    Gait Pattern Step-through pattern;Decreased arm swing - right    Ambulation Surface Level;Indoor    Gait velocity 32.8' over 8.35s = 3.92 ft/s without AD      Functional Gait  Assessment   Gait assessed  Yes    Gait Level Surface Walks 20 ft in less than 7 sec but greater than 5.5 sec, uses assistive device, slower speed, mild gait deviations, or deviates 6-10 in outside of the 12  in walkway width.   5.81s   Change in Gait Speed Able to smoothly change walking speed without loss of balance or gait deviation. Deviate no more than 6 in outside of the 12 in walkway width.    Gait with Horizontal Head Turns Performs head turns smoothly with no change in gait. Deviates no more than 6 in outside 12 in walkway width    Gait with Vertical Head Turns Performs task with slight change in gait velocity (eg, minor disruption to smooth gait path), deviates 6 - 10 in outside 12 in walkway width or uses assistive device   slow speed   Gait and Pivot Turn Pivot turns safely within 3 sec and stops quickly with no loss of balance.    Step Over Obstacle Is able to step over 2 stacked shoe boxes taped together (9 in total height) without changing gait speed. No  evidence of imbalance.    Gait with Narrow Base of Support Is able to ambulate for 10 steps heel to toe with no staggering.    Gait with Eyes Closed Walks 20 ft, uses assistive device, slower speed, mild gait deviations, deviates 6-10 in outside 12 in walkway width. Ambulates 20 ft in less than 9 sec but greater than 7 sec.    Ambulating Backwards Walks 20 ft, uses assistive device, slower speed, mild gait deviations, deviates 6-10 in outside 12 in walkway width.   15.9s   Steps Two feet to a stair, must use rail.    Total Score 24    FGA comment: Medium fall risk            PATIENT EDUCATION: Education details: Goal assessment  results, continuing HEP and walking program at home  Person educated: Patient Education method: Explanation and Demonstration Education comprehension: verbalized understanding and needs further education     HOME EXERCISE PROGRAM: Access Code: [email or web address] URL: [email or web address] Date: 12/28/2021 Prepared by: Mickie Bail Kearney Evitt  Exercises - Standing Single Leg Stance with Counter Support  - 1 x daily - 7 x weekly - 3 sets - 10 reps - Heel Toe Raises with Counter Support  - 1 x daily - 7 x weekly - 3 sets - 10 reps - Side Stepping with Red Resistance Band at Thighs and Counter Support  - 1 x daily - 7 x weekly - 3 sets - 10 reps - Forward Backward Monster Walk with Red Band at Thighs and Counter Support  - 1 x daily - 7 x weekly - 3 sets - 10 reps - Marching with Red Resistance Band - 1 x daily - 7 x weekly - 3 sets - 10 reps - Forward Step Down Touch with Heel  - 1 x daily - 7 x weekly - 3 sets - 10 reps - Gaze Stability (VOR) x1 Feet Together on Firm Ground With Horizontal Head Turns  - 1 x daily - 7 x weekly - 3 sets - 10 reps - Gaze Stability (VOR) x1 Feet Together on Firm Ground With Vertical Head Turns  - 1 x daily - 7 x weekly - 3 sets - 10 reps     GOALS: Goals reviewed with patient? Yes   SHORT TERM GOALS: Target date: 01/12/2022   Pt  will be independent with initial HEP for improved strength, balance, transfers and gait. Baseline: Established 12/23/2021; pt is compliant to HEP and walking program Goal status: MET   2.  Pt will improve 5 x STS to less than or equal to 25 seconds  without UE support to demonstrate improved functional strength and transfer efficiency.  Baseline: 35.21s without UE support; 15.44 seconds w/o UE support 01/11/2022 Goal status: MET   3.  Pt will increase BERG balance score to 48/56 to demonstrate improved static balance. Baseline: 44/56; 01/11/2022 51/56 Goal status: MET   4.  Pt will ambulate>350 feet on 2MWT to demonstrate improved endurance for functional tasks in home and community.  Baseline: 228'; 01/11/2022 402' Goal status: MET     LONG TERM GOALS: Target date: 02/09/2022   Pt will be independent with final HEP for improved strength, balance, transfers and gait. Baseline: To be established Goal status: MET   2.  Pt will improve gait velocity to at least 3.1 ft/s without AD for improved gait efficiency  Baseline: 2.5 ft/s without AD; 3.92 ft/s without AD Goal status: MET   3.  Pt will improve FGA to 25/30 for decreased fall risk   Baseline: 19/30; 24/30  Goal status: NOT MET   4.  Pt will ambulate>500 feet on 2MWT to demonstrate improved endurance for functional tasks in home and community. Baseline: See STG; 465'  Goal status: NOT MET   5.  Pt will improve 5 x STS to less than or equal to 13 seconds without UE support to demonstrate improved functional strength and transfer efficiency.  Baseline: 35.21s without UE support; 15.44 sec w/o UE support 01/11/2022; 10.57S  Goal status: MET     ASSESSMENT:   CLINICAL IMPRESSION: Emphasis of skilled PT session on LTG assessment and DC from PT. Pt has met 3/5 LTGs, decreasing her 5x STS time, improving gait velocity and performing HEP regularly. Pt achieved a 24/30 on FGA, barely missing her goal of 25/30. Pt ambulated 465' on 2MWT,  very close to her goal of >500'. Pt verbalized agreement and excitement to DC from PT today and states she has "good days and bad days" but overall is doing very well. Educated pt on how to obtain new PT referral if mobility needs change, pt verbalized understanding.      OBJECTIVE IMPAIRMENTS Abnormal gait, decreased activity tolerance, decreased balance, decreased endurance, decreased knowledge of use of DME, decreased mobility, difficulty walking, decreased strength, dizziness, and impaired sensation.    ACTIVITY LIMITATIONS carrying, lifting, bending, squatting, stairs, transfers, bed mobility, and locomotion level   PARTICIPATION LIMITATIONS: meal prep, cleaning, laundry, interpersonal relationship, driving, shopping, community activity, and occupation   [place] and 1 comorbidity: dizziness  are also affecting patient's functional outcome.    REHAB POTENTIAL: Good   CLINICAL DECISION MAKING: Stable/uncomplicated   EVALUATION COMPLEXITY: Low   PLAN: PT FREQUENCY:  2x/wk for 4 weeks and 1x/wk for 4 weeks   PT DURATION: 12 weeks   PLANNED INTERVENTIONS: Therapeutic exercises, Therapeutic activity, Neuromuscular re-education, Balance training, Gait training, Patient/Family education, Stair training, and DME instructions, vestibular training, canalith repositioning.   Cruzita Lederer Jaylend Reiland, PT, DPT 01/20/2022, 9:23 AM

## 2022-02-09 NOTE — Progress Notes (Signed)
Assessment and Plan:  Kimberly Zhang was seen today for follow-up.  Diagnoses and all orders for this visit:  Transient ischemic attack (TIA) PT, OT, ST are completed Continue therapy exercises provided Long discussion regarding her job- written out for the next month until she determines how to restart position with difficulty with concentration  Anxiety Continue Lexapro, and Buspar 5 mg TID PRN Has improved  but still has a lot of emotional lability Given the names of several therapists and strongly encouraged to schedyle appointment    Aphasia Continue to work on exercises and follow with neurology  Right sided weakness Has improved  Migraine Topamax is no longer providing migraine control, having at least 8 migraine days a month Uses Ubrelvy PRN and it does help Begin Qulipta 30 mg daily    Further disposition pending results of labs. Discussed med's effects and SE's.   Over 30 minutes of exam, counseling, chart review, and critical decision making was performed.   Future Appointments  Date Time Provider [place]  03/04/2022  3:15 PM Pixie Casino, MD [facility]  06/03/2022 10:00 AM Alycia Rossetti, NP [facility] None    ------------------------------------------------------------------------------------------------------------------   HPI BP 108/70   Pulse 73   Temp (!) 97.3 F (36.3 C)   Ht '5\' 3"'$  (1.6 m)   Wt 172 lb 9.6 oz (78.3 kg)   SpO2 98%   BMI 30.57 kg/m   56 y.o.female presents for reevaluation s/p TIA with deficits. She is discharged from speech , OT and PT. She has had follow up with neurology and diagnosed TIA vs Complicated migraine. Continued to have mood lability and decreased strength in right hand. She has been having some Migraines and is using daily Topamax and Ubrelvy PRN. Her migraines have increased in frequency and is having at least 2 migraines a week/8 migraine days a month. She had been on Topamax for a very long time but  migraines are now not controlled   She is currently written off work.  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  She was responsible for reading doctors notes, coordinating referrals. She is unable to function currently in this role. This role was very high stress. She was not given any further work restrictions from neurology.   BMI is Body mass index is 30.57 kg/m., she has been working on diet and exercise. Wt Readings from Last 3 Encounters:  02/10/22 172 lb 9.6 oz (78.3 kg)  01/18/22 173 lb (78.5 kg)  01/13/22 173 lb (78.5 kg)   She is not currently on medication and blood pressure is well controlled.  BP Readings from Last 3 Encounters:  02/10/22 108/70  01/18/22 126/73  01/18/22 126/70     Past Medical History:  Diagnosis Date   Allergy    Anxiety    Asthma    Benign hematuria 2011   Ottelin   Hyperlipidemia    Migraine    Vitamin D deficiency      Allergies  Allergen Reactions   Shellfish Allergy Anaphylaxis   Shellfish-Derived Products Anaphylaxis   Atorvastatin     Severe nausea   Rosuvastatin     Nausea, GI    Current Outpatient Medications on File Prior to Visit  Medication Sig   aspirin EC 81 MG tablet Take 1 tablet (81 mg total) by mouth daily. Swallow whole.   buPROPion (WELLBUTRIN XL) 150 MG 24 hr tablet Take one tablet daily   busPIRone (BUSPAR) 5  MG tablet Take 1 tablet (5 mg total) by mouth 3 (three) times daily.   escitalopram (LEXAPRO) 20 MG tablet TAKE 1 TABLET BY MOUTH DAILY FOR MOOD (Patient taking differently: Take 20 mg by mouth daily.)   levocetirizine (XYZAL) 5 MG tablet TAKE 1 TABLET BY MOUTH DAILY FOR ALLERGIES (Patient taking differently: Take 5 mg by mouth daily.)   topiramate (TOPAMAX) 100 MG tablet Take 1 tablet  at Bedtime  for Migraine Prevention & Weight Loss (Patient taking differently: Take 100 mg by mouth at bedtime.)   UBRELVY 50 MG TABS TAKE 1 TABLET BY MOUTH AS NEEDED    Vitamin D, Ergocalciferol, (DRISDOL) 1.25 MG (50000 UNIT) CAPS capsule TAKE 1 CAPSULE BY MOUTH 3 DAYS A WEEK (Patient taking differently: Take 50,000 Units by mouth. TAKE 1 CAPSULE BY MOUTH 3 DAYS A WEEK)   No current facility-administered medications on file prior to visit.    ROS: all negative except above.   Physical Exam:  BP 108/70   Pulse 73   Temp (!) 97.3 F (36.3 C)   Ht '5\' 3"'$  (1.6 m)   Wt 172 lb 9.6 oz (78.3 kg)   SpO2 98%   BMI 30.57 kg/m   General Appearance: Well nourished, in no apparent distress. Mood more stable, minimal crying on exam today Eyes: PERRLA, EOMs, conjunctiva no swelling or erythema Sinuses: No Frontal/maxillary tenderness ENT/Mouth: Ext aud canals clear, TMs without erythema, bulging. No erythema, swelling, or exudate on post pharynx.  Tonsils not swollen or erythematous. Hearing normal.  Neck: Supple, thyroid normal.  Respiratory: Respiratory effort normal, BS equal bilaterally without rales, rhonchi, wheezing or stridor.  Cardio: RRR with no MRGs. Brisk peripheral pulses without edema.  Abdomen: Soft, + BS.  Non tender, no guarding, rebound, hernias, masses. Lymphatics: Non tender without lymphadenopathy.  Musculoskeletal: Full ROM, slow steady gait. Strength 5/5 Skin: Warm, dry without rashes, lesions, ecchymosis.  Neuro: Cranial nerves intact. Decreased muscle tone right arm and leg.. Sensation intact.  Aphasia is noted only rarely during conversation Psych: Awake and oriented X 3, normal affect, Insight and Judgment appropriate.     Alycia Rossetti, NP 11:40 AM [name]

## 2022-02-10 ENCOUNTER — Ambulatory Visit (INDEPENDENT_AMBULATORY_CARE_PROVIDER_SITE_OTHER): Payer: BC Managed Care – PPO | Admitting: Nurse Practitioner

## 2022-02-10 ENCOUNTER — Encounter: Payer: Self-pay | Admitting: Nurse Practitioner

## 2022-02-10 VITALS — BP 108/70 | HR 73 | Temp 97.3°F | Ht 63.0 in | Wt 172.6 lb

## 2022-02-10 DIAGNOSIS — R531 Weakness: Secondary | ICD-10-CM

## 2022-02-10 DIAGNOSIS — G43809 Other migraine, not intractable, without status migrainosus: Secondary | ICD-10-CM

## 2022-02-10 DIAGNOSIS — G459 Transient cerebral ischemic attack, unspecified: Secondary | ICD-10-CM

## 2022-02-10 DIAGNOSIS — F419 Anxiety disorder, unspecified: Secondary | ICD-10-CM | POA: Diagnosis not present

## 2022-02-10 DIAGNOSIS — R4701 Aphasia: Secondary | ICD-10-CM | POA: Diagnosis not present

## 2022-02-10 MED ORDER — QULIPTA 30 MG PO TABS: 30.0000 mg | ORAL_TABLET | Freq: Every day | ORAL | 3 refills | Status: DC

## 2022-02-28 ENCOUNTER — Telehealth: Payer: BC Managed Care – PPO | Admitting: Physician Assistant

## 2022-02-28 DIAGNOSIS — J019 Acute sinusitis, unspecified: Secondary | ICD-10-CM

## 2022-02-28 DIAGNOSIS — B9689 Other specified bacterial agents as the cause of diseases classified elsewhere: Secondary | ICD-10-CM

## 2022-02-28 DIAGNOSIS — R051 Acute cough: Secondary | ICD-10-CM | POA: Diagnosis not present

## 2022-02-28 MED ORDER — PROMETHAZINE-DM 6.25-15 MG/5ML PO SYRP: 5.0000 mL | ORAL_SOLUTION | Freq: Four times a day (QID) | ORAL | 0 refills | Status: DC | PRN

## 2022-02-28 MED ORDER — AMOXICILLIN-POT CLAVULANATE 875-125 MG PO TABS: 1.0000 | ORAL_TABLET | Freq: Two times a day (BID) | ORAL | 0 refills | Status: DC

## 2022-02-28 MED ORDER — BENZONATATE 100 MG PO CAPS: 100.0000 mg | ORAL_CAPSULE | Freq: Three times a day (TID) | ORAL | 0 refills | Status: DC | PRN

## 2022-02-28 NOTE — Patient Instructions (Signed)
Alfredia Ferguson, thank you for joining Mar Daring, PA-C for today's virtual visit.  While this provider is not your primary care provider (PCP), if your PCP is located in our provider database this encounter information will be shared with them immediately following your visit.  Consent: (Patient) Alfredia Ferguson provided verbal consent for this virtual visit at the beginning of the encounter.  Current Medications:  Current Outpatient Medications:    amoxicillin-clavulanate (AUGMENTIN) 875-125 MG tablet, Take 1 tablet by mouth 2 (two) times daily., Disp: 14 tablet, Rfl: 0   benzonatate (TESSALON) 100 MG capsule, Take 1 capsule (100 mg total) by mouth 3 (three) times daily as needed., Disp: 30 capsule, Rfl: 0   promethazine-dextromethorphan (PROMETHAZINE-DM) 6.25-15 MG/5ML syrup, Take 5 mLs by mouth 4 (four) times daily as needed., Disp: 118 mL, Rfl: 0   aspirin EC 81 MG tablet, Take 1 tablet (81 mg total) by mouth daily. Swallow whole., Disp: 30 tablet, Rfl: 12   Atogepant (QULIPTA) 30 MG TABS, Take 30 mg by mouth daily., Disp: 30 tablet, Rfl: 3   buPROPion (WELLBUTRIN XL) 150 MG 24 hr tablet, Take one tablet daily, Disp: 90 tablet, Rfl: 0   busPIRone (BUSPAR) 5 MG tablet, Take 1 tablet (5 mg total) by mouth 3 (three) times daily., Disp: 90 tablet, Rfl: 3   escitalopram (LEXAPRO) 20 MG tablet, TAKE 1 TABLET BY MOUTH DAILY FOR MOOD (Patient taking differently: Take 20 mg by mouth daily.), Disp: 90 tablet, Rfl: 3   levocetirizine (XYZAL) 5 MG tablet, TAKE 1 TABLET BY MOUTH DAILY FOR ALLERGIES (Patient taking differently: Take 5 mg by mouth daily.), Disp: 90 tablet, Rfl: 1   UBRELVY 50 MG TABS, TAKE 1 TABLET BY MOUTH AS NEEDED, Disp: 30 tablet, Rfl: 3   Vitamin D, Ergocalciferol, (DRISDOL) 1.25 MG (50000 UNIT) CAPS capsule, TAKE 1 CAPSULE BY MOUTH 3 DAYS A WEEK (Patient taking differently: Take 50,000 Units by mouth. TAKE 1 CAPSULE BY MOUTH 3 DAYS A WEEK), Disp: 36 capsule, Rfl: 1    Medications ordered in this encounter:  Meds ordered this encounter  Medications   amoxicillin-clavulanate (AUGMENTIN) 875-125 MG tablet    Sig: Take 1 tablet by mouth 2 (two) times daily.    Dispense:  14 tablet    Refill:  0    Order Specific Question:   Supervising Provider    Answer:   MILLER, BRIAN [[id number]]   benzonatate (TESSALON) 100 MG capsule    Sig: Take 1 capsule (100 mg total) by mouth 3 (three) times daily as needed.    Dispense:  30 capsule    Refill:  0    Order Specific Question:   Supervising Provider    Answer:   Noemi Chapel [[id number]]   promethazine-dextromethorphan (PROMETHAZINE-DM) 6.25-15 MG/5ML syrup    Sig: Take 5 mLs by mouth 4 (four) times daily as needed.    Dispense:  118 mL    Refill:  0    Order Specific Question:   Supervising Provider    Answer:   Sabra Heck, BRIAN [[id number]]     *If you need refills on other medications prior to your next appointment, please contact your pharmacy*  Follow-Up: Call back or seek an in-person evaluation if the symptoms worsen or if the condition fails to improve as anticipated.  Other Instructions Sinus Infection, Adult A sinus infection, also called sinusitis, is inflammation of your sinuses. Sinuses are hollow spaces in the bones around your face. Your sinuses are located: Around your  eyes. In the middle of your forehead. Behind your nose. In your cheekbones. Mucus normally drains out of your sinuses. When your nasal tissues become inflamed or swollen, mucus can become trapped or blocked. This allows bacteria, viruses, and fungi to grow, which leads to infection. Most infections of the sinuses are caused by a virus. A sinus infection can develop quickly. It can last for up to 4 weeks (acute) or for more than 12 weeks (chronic). A sinus infection often develops after a cold. What are the causes? This condition is caused by anything that creates swelling in the sinuses or stops mucus from draining. This  includes: Allergies. Asthma. Infection from bacteria or viruses. Deformities or blockages in your nose or sinuses. Abnormal growths in the nose (nasal polyps). Pollutants, such as chemicals or irritants in the air. Infection from fungi. This is rare. What increases the risk? You are more likely to develop this condition if you: Have a weak body defense system (immune system). Do a lot of swimming or diving. Overuse nasal sprays. Smoke. What are the signs or symptoms? The main symptoms of this condition are pain and a feeling of pressure around the affected sinuses. Other symptoms include: Stuffy nose or congestion that makes it difficult to breathe through your nose. Thick yellow or greenish drainage from your nose. Tenderness, swelling, and warmth over the affected sinuses. A cough that may get worse at night. Decreased sense of smell and taste. Extra mucus that collects in the throat or the back of the nose (postnasal drip) causing a sore throat or bad breath. Tiredness (fatigue). Fever. How is this diagnosed? This condition is diagnosed based on: Your symptoms. Your medical history. A physical exam. Tests to find out if your condition is acute or chronic. This may include: Checking your nose for nasal polyps. Viewing your sinuses using a device that has a light (endoscope). Testing for allergies or bacteria. Imaging tests, such as an MRI or CT scan. In rare cases, a bone biopsy may be done to rule out more serious types of fungal sinus disease. How is this treated? Treatment for a sinus infection depends on the cause and whether your condition is chronic or acute. If caused by a virus, your symptoms should go away on their own within 10 days. You may be given medicines to relieve symptoms. They include: Medicines that shrink swollen nasal passages (decongestants). A spray that eases inflammation of the nostrils (topical intranasal corticosteroids). Rinses that help get rid  of thick mucus in your nose (nasal saline washes). Medicines that treat allergies (antihistamines). Over-the-counter pain relievers. If caused by bacteria, your health care provider may recommend waiting to see if your symptoms improve. Most bacterial infections will get better without antibiotic medicine. You may be given antibiotics if you have: A severe infection. A weak immune system. If caused by narrow nasal passages or nasal polyps, surgery may be needed. Follow these instructions at home: Medicines Take, use, or apply over-the-counter and prescription medicines only as told by your health care provider. These may include nasal sprays. If you were prescribed an antibiotic medicine, take it as told by your health care provider. Do not stop taking the antibiotic even if you start to feel better. Hydrate and humidify  Drink enough fluid to keep your urine pale yellow. Staying hydrated will help to thin your mucus. Use a cool mist humidifier to keep the humidity level in your home above 50%. Inhale steam for 10-15 minutes, 3-4 times a day, or  as told by your health care provider. You can do this in the bathroom while a hot shower is running. Limit your exposure to cool or dry air. Rest Rest as much as possible. Sleep with your head raised (elevated). Make sure you get enough sleep each night. General instructions  Apply a warm, moist washcloth to your face 3-4 times a day or as told by your health care provider. This will help with discomfort. Use nasal saline washes as often as told by your health care provider. Wash your hands often with soap and water to reduce your exposure to germs. If soap and water are not available, use hand sanitizer. Do not smoke. Avoid being around people who are smoking (secondhand smoke). Keep all follow-up visits. This is important. Contact a health care provider if: You have a fever. Your symptoms get worse. Your symptoms do not improve within 10  days. Get help right away if: You have a severe headache. You have persistent vomiting. You have severe pain or swelling around your face or eyes. You have vision problems. You develop confusion. Your neck is stiff. You have trouble breathing. These symptoms may be an emergency. Get help right away. Call 911. Do not wait to see if the symptoms will go away. Do not drive yourself to the hospital. Summary A sinus infection is soreness and inflammation of your sinuses. Sinuses are hollow spaces in the bones around your face. This condition is caused by nasal tissues that become inflamed or swollen. The swelling traps or blocks the flow of mucus. This allows bacteria, viruses, and fungi to grow, which leads to infection. If you were prescribed an antibiotic medicine, take it as told by your health care provider. Do not stop taking the antibiotic even if you start to feel better. Keep all follow-up visits. This is important. This information is not intended to replace advice given to you by your health care provider. Make sure you discuss any questions you have with your health care provider. Document Revised: 06/15/2021 Document Reviewed: 06/15/2021 Elsevier Patient Education  [place].    If you have been instructed to have an in-person evaluation today at a local Urgent Care facility, please use the link below. It will take you to a list of all of our available [facility], including address, phone number and hours of operation. Please do not delay care.  [facility]  If you or a family member do not have a primary care provider, use the link below to schedule a visit and establish care. When you choose a [facility] primary care physician or advanced practice provider, you gain a long-term partner in health. Find a Primary Care Provider  Learn more about 's in-office and virtual care options: [facility]

## 2022-02-28 NOTE — Progress Notes (Signed)
Virtual Visit Consent   Kimberly Zhang, you are scheduled for a virtual visit with a [facility] provider today. Just as with appointments in the office, your consent must be obtained to participate. Your consent will be active for this visit and any virtual visit you may have with one of our providers in the next 365 days. If you have a MyChart account, a copy of this consent can be sent to you electronically.  As this is a virtual visit, video technology does not allow for your provider to perform a traditional examination. This may limit your provider's ability to fully assess your condition. If your provider identifies any concerns that need to be evaluated in person or the need to arrange testing (such as labs, EKG, etc.), we will make arrangements to do so. Although advances in technology are sophisticated, we cannot ensure that it will always work on either your end or our end. If the connection with a video visit is poor, the visit may have to be switched to a telephone visit. With either a video or telephone visit, we are not always able to ensure that we have a secure connection.  By engaging in this virtual visit, you consent to the provision of healthcare and authorize for your insurance to be billed (if applicable) for the services provided during this visit. Depending on your insurance coverage, you may receive a charge related to this service.  I need to obtain your verbal consent now. Are you willing to proceed with your visit today? Kimberly Zhang has provided verbal consent on 02/28/2022 for a virtual visit (video or telephone). Mar Daring, PA-C  Date: 02/28/2022 10:25 AM  Virtual Visit via Video Note   I, Mar Daring, connected with  Kimberly Zhang  ([id number], 10-22-1965) on 02/28/22 at 10:15 AM EDT by a video-enabled telemedicine application and verified that I am speaking with the correct person using two identifiers.  Location: Patient: Virtual Visit  Location Patient: Home Provider: Virtual Visit Location Provider: Home Office   I discussed the limitations of evaluation and management by telemedicine and the availability of in person appointments. The patient expressed understanding and agreed to proceed.    History of Present Illness: Kimberly Zhang is a 56 y.o. who identifies as a female who was assigned female at birth, and is being seen today for Possible URI/ cough/sinus pressure.  HPI: Cough This is a new problem. The current episode started in the past 7 days (returned from a conference in [place]). The problem has been gradually worsening. The problem occurs constantly. The cough is Productive of sputum and productive of purulent sputum. Associated symptoms include chest pain (heaviness), chills (last night), ear congestion (chronic), headaches, myalgias, nasal congestion, postnasal drip and shortness of breath. Pertinent negatives include no ear pain, fever, rhinorrhea, sore throat, sweats or wheezing. The symptoms are aggravated by lying down. Risk factors for lung disease include travel. Treatments tried: echinacea. The treatment provided no relief. Her past medical history is significant for asthma and environmental allergies. There is no history of bronchitis, COPD, emphysema or pneumonia.    At home covid testing (multiple) have been negative.  Problems:  Patient Active Problem List   Diagnosis Date Noted   Transient ischemic attack (TIA) 12/10/2021   B12 deficiency 04/21/2021   Obesity (BMI 30.0-34.9) 12/01/2015   Female genuine stress incontinence 10/20/2014   Benign hematuria    Hyperlipidemia    Anxiety    Asthma  Migraine    Vitamin D deficiency     Allergies:  Allergies  Allergen Reactions   Shellfish Allergy Anaphylaxis   Shellfish-Derived Products Anaphylaxis   Atorvastatin     Severe nausea   Rosuvastatin     Nausea, GI   Medications:  Current Outpatient Medications:    amoxicillin-clavulanate  (AUGMENTIN) 875-125 MG tablet, Take 1 tablet by mouth 2 (two) times daily., Disp: 14 tablet, Rfl: 0   benzonatate (TESSALON) 100 MG capsule, Take 1 capsule (100 mg total) by mouth 3 (three) times daily as needed., Disp: 30 capsule, Rfl: 0   promethazine-dextromethorphan (PROMETHAZINE-DM) 6.25-15 MG/5ML syrup, Take 5 mLs by mouth 4 (four) times daily as needed., Disp: 118 mL, Rfl: 0   aspirin EC 81 MG tablet, Take 1 tablet (81 mg total) by mouth daily. Swallow whole., Disp: 30 tablet, Rfl: 12   Atogepant (QULIPTA) 30 MG TABS, Take 30 mg by mouth daily., Disp: 30 tablet, Rfl: 3   buPROPion (WELLBUTRIN XL) 150 MG 24 hr tablet, Take one tablet daily, Disp: 90 tablet, Rfl: 0   busPIRone (BUSPAR) 5 MG tablet, Take 1 tablet (5 mg total) by mouth 3 (three) times daily., Disp: 90 tablet, Rfl: 3   escitalopram (LEXAPRO) 20 MG tablet, TAKE 1 TABLET BY MOUTH DAILY FOR MOOD (Patient taking differently: Take 20 mg by mouth daily.), Disp: 90 tablet, Rfl: 3   levocetirizine (XYZAL) 5 MG tablet, TAKE 1 TABLET BY MOUTH DAILY FOR ALLERGIES (Patient taking differently: Take 5 mg by mouth daily.), Disp: 90 tablet, Rfl: 1   UBRELVY 50 MG TABS, TAKE 1 TABLET BY MOUTH AS NEEDED, Disp: 30 tablet, Rfl: 3   Vitamin D, Ergocalciferol, (DRISDOL) 1.25 MG (50000 UNIT) CAPS capsule, TAKE 1 CAPSULE BY MOUTH 3 DAYS A WEEK (Patient taking differently: Take 50,000 Units by mouth. TAKE 1 CAPSULE BY MOUTH 3 DAYS A WEEK), Disp: 36 capsule, Rfl: 1  Observations/Objective: Patient is well-developed, well-nourished in no acute distress.  Resting comfortably at home.  Head is normocephalic, atraumatic.  No labored breathing.  Speech is clear and coherent with logical content.  Patient is alert and oriented at baseline.    Assessment and Plan: 1. Acute bacterial sinusitis - amoxicillin-clavulanate (AUGMENTIN) 875-125 MG tablet; Take 1 tablet by mouth 2 (two) times daily.  Dispense: 14 tablet; Refill: 0 - benzonatate (TESSALON) 100 MG  capsule; Take 1 capsule (100 mg total) by mouth 3 (three) times daily as needed.  Dispense: 30 capsule; Refill: 0 - promethazine-dextromethorphan (PROMETHAZINE-DM) 6.25-15 MG/5ML syrup; Take 5 mLs by mouth 4 (four) times daily as needed.  Dispense: 118 mL; Refill: 0  2. Acute cough - benzonatate (TESSALON) 100 MG capsule; Take 1 capsule (100 mg total) by mouth 3 (three) times daily as needed.  Dispense: 30 capsule; Refill: 0 - promethazine-dextromethorphan (PROMETHAZINE-DM) 6.25-15 MG/5ML syrup; Take 5 mLs by mouth 4 (four) times daily as needed.  Dispense: 118 mL; Refill: 0  - Worsening symptoms that have not responded to OTC medications.  - Will give Augmentin, Promethazine DM and tessalon perles - Continue allergy medications.  - Steam and humidifier can help - Stay well hydrated and get plenty of rest.  - Seek in person evaluation if no symptom improvement or if symptoms worsen   Follow Up Instructions: I discussed the assessment and treatment plan with the patient. The patient was provided an opportunity to ask questions and all were answered. The patient agreed with the plan and demonstrated an understanding of the instructions.  A  copy of instructions were sent to the patient via MyChart unless otherwise noted below.   The patient was advised to call back or seek an in-person evaluation if the symptoms worsen or if the condition fails to improve as anticipated.  Time:  I spent 11 minutes with the patient via telehealth technology discussing the above problems/concerns.    Mar Daring, PA-C

## 2022-03-04 ENCOUNTER — Encounter (HOSPITAL_BASED_OUTPATIENT_CLINIC_OR_DEPARTMENT_OTHER): Payer: Self-pay | Admitting: Internal Medicine

## 2022-03-04 ENCOUNTER — Ambulatory Visit (HOSPITAL_BASED_OUTPATIENT_CLINIC_OR_DEPARTMENT_OTHER): Payer: BC Managed Care – PPO | Admitting: Internal Medicine

## 2022-03-04 ENCOUNTER — Telehealth: Payer: Self-pay | Admitting: Internal Medicine

## 2022-03-04 VITALS — BP 110/68 | HR 70 | Ht 65.0 in | Wt 173.6 lb

## 2022-03-04 DIAGNOSIS — Z789 Other specified health status: Secondary | ICD-10-CM | POA: Diagnosis not present

## 2022-03-04 DIAGNOSIS — E7801 Familial hypercholesterolemia: Secondary | ICD-10-CM

## 2022-03-04 DIAGNOSIS — Z8673 Personal history of transient ischemic attack (TIA), and cerebral infarction without residual deficits: Secondary | ICD-10-CM | POA: Diagnosis not present

## 2022-03-04 DIAGNOSIS — E78019 Familial hypercholesterolemia, unspecified: Secondary | ICD-10-CM

## 2022-03-04 MED ORDER — REPATHA SURECLICK 140 MG/ML ~~LOC~~ SOAJ: 1.0000 | SUBCUTANEOUS | 3 refills | Status: DC

## 2022-03-04 NOTE — Patient Instructions (Addendum)
Medication Instructions:    Dr. Debara Pickett recommends Repatha '140mg'$ /mL (PCSK9). This is an injectable cholesterol medication self-administered once every 14 days. This medication will likely need prior approval with your insurance company, which we will work on. If the medication is not approved initially, we may need to do an appeal with your insurance.   Administer medication in area of fatty tissue such as abdomen, outer thigh, back of upper arm - and rotate site with each injection Store medication in refrigerator until ready to administer - allow to sit at room temp for 30 mins - 1 hour prior to injection Dispose of medication in a SHARPS container - your pharmacy should be able to direct you on this and proper disposal   If you need a co-pay card for Repatha: [email or web address] If you need a co-pay card for Praluent: [email or web address].[email or web address].uy  Patient Assistance:    These foundations have funds at various times.   The [employer] Foundation: https://www.panfoundation.org/disease-funds/hypercholesterolemia/ -- can sign up for wait list  The [facility] offers assistance to help pay for medication copays.  They will cover copays for all cholesterol lowering meds, including statins, fibrates, omega-3 fish oils like Vascepa, ezetimibe, Repatha, Praluent, Nexletol, Nexlizet.  The cards are usually good for $2,500 or 12 months, whichever comes first. Go to healthwellfoundation.org Click on "Apply Now" Answer questions as to whom is applying (patient or representative) Your disease fund will be "hypercholesterolemia - Medicare access" They will ask questions about finances and which medications you are taking for cholesterol When you submit, the approval is usually within minutes.  You will need to print the card information from the site You will need to show this information to your pharmacy, they will bill your Medicare Part D plan first -then bill [employer]  Well --for the copay.   You can also call them at [phone], although the hold times can be quite long.     *If you need a refill on your cardiac medications before your next appointment, please call your pharmacy*   Lab Work: FASTING lab work to check cholesterol in 3-4 months -- complete about 1 week before next appointment   If you have labs (blood work) drawn today and your tests are completely normal, you will receive your results only by: [place] (if you have MyChart) OR A paper copy in the mail If you have any lab test that is abnormal or we need to change your treatment, we will call you to review the results.   Testing/Procedures: Genetic Test - results available in about 2-3 weeks    Follow-Up: At [facility], you and your health needs are our priority.  As part of our continuing mission to provide you with exceptional heart care, we have created designated Provider Care Teams.  These Care Teams include your primary Cardiologist (physician) and Advanced Practice Providers (APPs -  Physician Assistants and Nurse Practitioners) who all work together to provide you with the care you need, when you need it.  We recommend signing up for the patient portal called "MyChart".  Sign up information is provided on this After Visit Summary.  MyChart is used to connect with patients for Virtual Visits (Telemedicine).  Patients are able to view lab/test results, encounter notes, upcoming appointments, etc.  Non-urgent messages can be sent to your provider as well.   To learn more about what you can do with MyChart, go to [email or web address].    Your next appointment:   3-4 months with Dr. Debara Pickett

## 2022-03-04 NOTE — Telephone Encounter (Signed)
Genetic test for dyslipidemia/ASCVD ordered ([facility]) Cheek swab completed in office Specimen and necessary paperwork mailed. ID: [id number]

## 2022-03-07 ENCOUNTER — Encounter (HOSPITAL_BASED_OUTPATIENT_CLINIC_OR_DEPARTMENT_OTHER): Payer: Self-pay | Admitting: Internal Medicine

## 2022-03-07 NOTE — Progress Notes (Signed)
LIPID CLINIC CONSULT NOTE  Chief Complaint:  Manage dyslipidemia  Primary Care Physician: Unk Pinto, MD  Primary Cardiologist:  None  HPI:  Kimberly Zhang is a 56 y.o. female who is being seen today for the evaluation of lipidemia at the request of Kimberly Rossetti, NP.  This is a pleasant 56 year old female kindly referred for evaluation management of dyslipidemia.  She has a strong family history of high cholesterol in her mom and her sister who also had coronary artery disease and her son who has high cholesterol.  She also has a history of prior stroke and therefore her target LDL is less than 70.  Unfortunately she could not tolerate statins having tried atorvastatin and rosuvastatin both of which caused severe nausea and GI discomfort.  Her last lipids were in May 2023 showing total cholesterol 386, triglycerides 190, HDL 40 and LDL 309.  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  She reports his diet low in saturated fats.  PMHx:  Past Medical History:  Diagnosis Date   Allergy    Anxiety    Asthma    Benign hematuria 2011   Ottelin   Hyperlipidemia    Migraine    Vitamin D deficiency     Past Surgical History:  Procedure Laterality Date   ABDOMINAL HYSTERECTOMY  1998   COLONOSCOPY  2018   LEEP     POLYPECTOMY     TONSILECTOMY/ADENOIDECTOMY WITH MYRINGOTOMY      FAMHx:  Family History  Problem Relation Age of Onset   Liver disease Father    Hyperlipidemia Mother    Migraines Mother    Diabetes Paternal Grandmother    Heart disease Maternal Aunt    Cancer Maternal Aunt    Cancer Maternal Uncle    Depression Other    Alzheimer's disease Other    Healthy Sister    Healthy Son    Healthy Daughter    Colon cancer Neg Hx    Esophageal cancer Neg Hx    Rectal cancer Neg Hx    Stomach cancer Neg Hx    Colon polyps Neg Hx     SOCHx:   reports that  she has been smoking cigarettes. She has never used smokeless tobacco. She reports current alcohol use. She reports that she does not use drugs.  ALLERGIES:  Allergies  Allergen Reactions   Shellfish Allergy Anaphylaxis   Shellfish-Derived Products Anaphylaxis   Atorvastatin     Severe nausea   Rosuvastatin     Nausea, GI    ROS: Pertinent items noted in HPI and remainder of comprehensive ROS otherwise negative.  HOME MEDS: Current Outpatient Medications on File Prior to Visit  Medication Sig Dispense Refill   amoxicillin-clavulanate (AUGMENTIN) 875-125 MG tablet Take 1 tablet by mouth 2 (two) times daily. 14 tablet 0   aspirin EC 81 MG tablet Take 1 tablet (81 mg total) by mouth daily. Swallow whole. 30 tablet 12   Atogepant (QULIPTA) 30 MG TABS Take 30 mg by mouth daily. 30 tablet 3   benzonatate (TESSALON) 100 MG capsule Take 1 capsule (100 mg total) by mouth 3 (three) times daily as needed. 30 capsule 0   buPROPion (WELLBUTRIN XL) 150 MG 24 hr tablet Take one tablet daily 90 tablet 0   busPIRone (BUSPAR) 5 MG tablet Take 1 tablet (5 mg total) by mouth 3 (three) times daily. 90 tablet 3   escitalopram (LEXAPRO)  20 MG tablet TAKE 1 TABLET BY MOUTH DAILY FOR MOOD (Patient taking differently: Take 20 mg by mouth daily.) 90 tablet 3   levocetirizine (XYZAL) 5 MG tablet TAKE 1 TABLET BY MOUTH DAILY FOR ALLERGIES (Patient taking differently: Take 5 mg by mouth daily.) 90 tablet 1   promethazine-dextromethorphan (PROMETHAZINE-DM) 6.25-15 MG/5ML syrup Take 5 mLs by mouth 4 (four) times daily as needed. 118 mL 0   topiramate (TOPAMAX) 100 MG tablet Take 100 mg by mouth daily.     UBRELVY 50 MG TABS TAKE 1 TABLET BY MOUTH AS NEEDED 30 tablet 3   verapamil (CALAN) 120 MG tablet Take 120 mg by mouth daily.     Vitamin D, Ergocalciferol, (DRISDOL) 1.25 MG (50000 UNIT) CAPS capsule TAKE 1 CAPSULE BY MOUTH 3 DAYS A WEEK (Patient taking differently: Take 50,000 Units by mouth. TAKE 1 CAPSULE BY  MOUTH 3 DAYS A WEEK) 36 capsule 1   No current facility-administered medications on file prior to visit.    LABS/IMAGING: No results found for this or any previous visit (from the past 48 hour(s)). No results found.  LIPID PANEL:    Component Value Date/Time   CHOL 386 (H) 12/14/2021 1028   TRIG 190 (H) 12/14/2021 1028   HDL 40 (L) 12/14/2021 1028   CHOLHDL 9.7 (H) 12/14/2021 1028   VLDL 23 10/19/2016 0909   LDLCALC 309 (H) 12/14/2021 1028    WEIGHTS: Wt Readings from Last 3 Encounters:  03/04/22 173 lb 9.6 oz (78.7 kg)  02/10/22 172 lb 9.6 oz (78.3 kg)  01/18/22 173 lb (78.5 kg)    VITALS: BP 110/68   Pulse 70   Ht '5\' 5"'$  (1.651 m)   Wt 173 lb 9.6 oz (78.7 kg)   SpO2 99%   BMI 28.89 kg/m   EXAM: General appearance: alert and no distress Neck: no carotid bruit, no JVD, and thyroid not enlarged, symmetric, no tenderness/mass/nodules Lungs: clear to auscultation bilaterally Heart: regular rate and rhythm Abdomen: soft, non-tender; bowel sounds normal; no masses,  no organomegaly Extremities: extremities normal, atraumatic, no cyanosis or edema Pulses: 2+ and symmetric Skin: Skin color, texture, turgor normal. 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   Kimberly Zhang has a history of very high cholesterol and prior stroke with evidence of cerebral atherosclerosis.  There is also a strong family history in multiple first-degree relatives suggestive of a familial hyperlipidemia.  Unfortunately she has been statin intolerant to both atorvastatin and rosuvastatin causing GI upset.  She is a good candidate for PCSK9 inhibitor which has primary location for lipid-lowering for familial hyperlipidemia.  We will reach out  for prior authorization.  Would also recommend genetic testing at is likely to be helpful to identify the etiology of her high cholesterol and screen other family members.  I will contact her with those results and we will plan to repeat lipid NMR and LP(a) in about 3 months on therapy.  Thanks again for the kind referral.  Pixie Casino, MD, FACC, [place] Director of the [facility]  Card[facility] Diplomate of the American Board of Clinical Lipidology Attending Cardiologist  Direct Dial: [phone]  Fax: [phone]  Website:  [email or web address] 03/07/2022, 8:26 PM

## 2022-03-08 ENCOUNTER — Telehealth: Payer: Self-pay

## 2022-03-08 NOTE — Telephone Encounter (Signed)
PA for Repatha 140 MG/ML, submitted and approved by [facility] key: [id number], Rx #: [id number] from 03/08/2022 to 03/09/2023.

## 2022-03-09 NOTE — Telephone Encounter (Signed)
Message sent to patient in MyChart w/update on med approval

## 2022-03-22 ENCOUNTER — Encounter: Payer: Self-pay | Admitting: Internal Medicine

## 2022-03-22 NOTE — Progress Notes (Unsigned)
Assessment and Plan:  There are no diagnoses linked to this encounter.    Further disposition pending results of labs. Discussed med's effects and SE's.   Over 30 minutes of exam, counseling, chart review, and critical decision making was performed.   Future Appointments  Date Time Provider [place]  03/23/2022 11:30 AM Alycia Rossetti, NP [facility] None  05/31/2022  8:45 AM Pixie Casino, MD [facility]  06/03/2022 10:00 AM Alycia Rossetti, NP [facility] None    ------------------------------------------------------------------------------------------------------------------   HPI There were no vitals taken for this visit. 56 y.o.female presents for  Past Medical History:  Diagnosis Date   Allergy    Anxiety    Asthma    Benign hematuria 2011   Ottelin   Hyperlipidemia    Migraine    Vitamin D deficiency      Allergies  Allergen Reactions   Shellfish Allergy Anaphylaxis   Shellfish-Derived Products Anaphylaxis   Atorvastatin     Severe nausea   Rosuvastatin     Nausea, GI    Current Outpatient Medications on File Prior to Visit  Medication Sig   amoxicillin-clavulanate (AUGMENTIN) 875-125 MG tablet Take 1 tablet by mouth 2 (two) times daily.   aspirin EC 81 MG tablet Take 1 tablet (81 mg total) by mouth daily. Swallow whole.   Atogepant (QULIPTA) 30 MG TABS Take 30 mg by mouth daily.   benzonatate (TESSALON) 100 MG capsule Take 1 capsule (100 mg total) by mouth 3 (three) times daily as needed.   buPROPion (WELLBUTRIN XL) 150 MG 24 hr tablet Take one tablet daily   busPIRone (BUSPAR) 5 MG tablet Take 1 tablet (5 mg total) by mouth 3 (three) times daily.   escitalopram (LEXAPRO) 20 MG tablet TAKE 1 TABLET BY MOUTH DAILY FOR MOOD (Patient taking differently: Take 20 mg by mouth daily.)   Evolocumab (REPATHA SURECLICK) 381 MG/ML SOAJ Inject 1 Dose into the skin every 14 (fourteen) days.   levocetirizine (XYZAL) 5 MG tablet TAKE 1 TABLET BY MOUTH  DAILY FOR ALLERGIES (Patient taking differently: Take 5 mg by mouth daily.)   promethazine-dextromethorphan (PROMETHAZINE-DM) 6.25-15 MG/5ML syrup Take 5 mLs by mouth 4 (four) times daily as needed.   topiramate (TOPAMAX) 100 MG tablet Take 100 mg by mouth daily.   UBRELVY 50 MG TABS TAKE 1 TABLET BY MOUTH AS NEEDED   verapamil (CALAN) 120 MG tablet Take 120 mg by mouth daily.   Vitamin D, Ergocalciferol, (DRISDOL) 1.25 MG (50000 UNIT) CAPS capsule TAKE 1 CAPSULE BY MOUTH 3 DAYS A WEEK (Patient taking differently: Take 50,000 Units by mouth. TAKE 1 CAPSULE BY MOUTH 3 DAYS A WEEK)   No current facility-administered medications on file prior to visit.    ROS: all negative except above.   Physical Exam:  There were no vitals taken for this visit.  General Appearance: Well nourished, in no apparent distress. Eyes: PERRLA, EOMs, conjunctiva no swelling or erythema Sinuses: No Frontal/maxillary tenderness ENT/Mouth: Ext aud canals clear, TMs without erythema, bulging. No erythema, swelling, or exudate on post pharynx.  Tonsils not swollen or erythematous. Hearing normal.  Neck: Supple, thyroid normal.  Respiratory: Respiratory effort normal, BS equal bilaterally without rales, rhonchi, wheezing or stridor.  Cardio: RRR with no MRGs. Brisk peripheral pulses without edema.  Abdomen: Soft, + BS.  Non tender, no guarding, rebound, hernias, masses. Lymphatics: Non tender without lymphadenopathy.  Musculoskeletal: Full ROM, 5/5 strength, normal gait.  Skin: Warm, dry without rashes, lesions, ecchymosis.  Neuro:  Cranial nerves intact. Normal muscle tone, no cerebellar symptoms. Sensation intact.  Psych: Awake and oriented X 3, normal affect, Insight and Judgment appropriate.     Alycia Rossetti, NP 9:51 AM [facility]

## 2022-03-23 ENCOUNTER — Ambulatory Visit (HOSPITAL_COMMUNITY)
Admission: RE | Admit: 2022-03-23 | Discharge: 2022-03-23 | Disposition: A | Discharge status: Home / Self Care | Payer: BC Managed Care – PPO | Source: Ambulatory Visit | Attending: Nurse Practitioner | Admitting: Nurse Practitioner

## 2022-03-23 ENCOUNTER — Encounter: Payer: Self-pay | Admitting: Nurse Practitioner

## 2022-03-23 ENCOUNTER — Ambulatory Visit (INDEPENDENT_AMBULATORY_CARE_PROVIDER_SITE_OTHER): Payer: BC Managed Care – PPO | Admitting: Nurse Practitioner

## 2022-03-23 VITALS — BP 118/78 | HR 84 | Temp 97.8°F | Resp 17 | Ht 65.0 in | Wt 166.8 lb

## 2022-03-23 DIAGNOSIS — R251 Tremor, unspecified: Secondary | ICD-10-CM

## 2022-03-23 DIAGNOSIS — R4702 Dysphasia: Secondary | ICD-10-CM | POA: Diagnosis present

## 2022-03-23 DIAGNOSIS — R531 Weakness: Secondary | ICD-10-CM

## 2022-03-23 DIAGNOSIS — F419 Anxiety disorder, unspecified: Secondary | ICD-10-CM

## 2022-03-23 DIAGNOSIS — G43809 Other migraine, not intractable, without status migrainosus: Secondary | ICD-10-CM

## 2022-03-23 MED ORDER — GADOBUTROL 1 MMOL/ML IV SOLN
7.5000 mL | Freq: Once | INTRAVENOUS | Status: AC | PRN
  Administered 2022-03-23: 7.5 mL via INTRAVENOUS

## 2022-03-23 NOTE — Patient Instructions (Signed)
Transient Ischemic Attack A transient ischemic attack (TIA) causes stroke-like symptoms that go away quickly. Having a TIA means that a person is at higher risk for a stroke. A TIA happens when blood supply to the brain is blocked temporarily. A TIA is a medical emergency. What are the causes? This condition is caused by a temporary blockage in an artery in the head or neck. This means the brain does not get the blood supply it needs. There is no permanent brain damage with a TIA. A blockage can be caused by: Fatty buildup in an artery in the head or neck (atherosclerosis). A blood clot. An artery tear (dissection). Inflammation of an artery (vasculitis). Sometimes the cause is not known. What increases the risk? Certain factors may make you more likely to develop this condition. Some of these are things that you can change, such as: Obesity. Using products that contain nicotine or tobacco. Taking oral birth control, especially if you also use tobacco. Not being active. Heavy alcohol use. Drug use, especially cocaine and methamphetamine. Medical conditions that may increase your risk include: High blood pressure (hypertension). High cholesterol. Diabetes. Heart disease (coronary artery disease). An irregular heartbeat, also called atrial fibrillation (AFib). Sickle cell disease. Sleep problems (sleep apnea). Chronic inflammatory diseases, such as rheumatoid arthritis or lupus. Blood clotting disorders (hypercoagulable state). Other risk factors include: Being over the age of 38. Being female. Family history of stroke. Previous history of blood clots, stroke, TIA, or heart attack. Having a history of preeclampsia. Migraine headache. What are the signs or symptoms? Symptoms of a TIA are the same as those of a stroke. The symptoms develop suddenly, and then go away quickly. They may include: Weakness or numbness in your face, arm, or leg, especially on one side of your body. Trouble  walking or moving your arms or legs. Trouble speaking, understanding speech, or both (aphasia). Vision changes, such as double vision, blurred vision, or loss of vision. Dizziness. Confusion. Loss of balance or coordination. Nausea and vomiting. Severe headache. If possible, note what time your symptoms started. Tell your health care provider. How is this diagnosed? This condition may be diagnosed based on: Your symptoms and medical history. A physical exam. Imaging tests, usually a CT scan or MRI of the brain. Blood tests. You may also have other tests, including: Electrocardiogram (ECG). Echocardiogram. Carotid ultrasound. A scan of blood circulation in the brain (CT angiogram or MR angiogram). Continuous heart monitoring. How is this treated? The goal of treatment is to reduce the risk for a stroke. Stroke prevention therapies may include: Changes to diet and lifestyle, such as being physically active and stopping smoking. Medicines to thin the blood (antiplatelets or anticoagulants). Blood pressure medicines. Medicines to reduce cholesterol. Treating other health conditions, such as diabetes or AFib. If testing shows a narrowing in the arteries to your brain, your health care provider may recommend a procedure, such as: Carotid endarterectomy. This is done to remove the blockage from your artery. Carotid angioplasty and stenting. This uses a tube (stent) to open or widen an artery in the neck. The stent helps keep the artery open by supporting the artery walls. Follow these instructions at home: Medicines Take over-the-counter and prescription medicines only as told by your health care provider. If you were told to take a medicine to thin your blood, such as aspirin or an anticoagulant, take it exactly as told by your health care provider. Taking too much blood-thinning medicine can cause bleeding. Taking too little will  not protect you against a stroke and other  problems. Eating and drinking  Eat 5 or more servings of fruits and vegetables each day. Follow guidelines from your health care provider about your diet. You may need to follow a certain diet to help manage risk factors for stroke. This may include: Eating a low-fat, low-salt diet. Choosing high-fiber foods. Limiting carbohydrates and sugar. If you drink alcohol: Limit how much you have to: 0-1 drink a day for women who are not pregnant. 0-2 drinks a day for men. Know how much alcohol is in a drink. In the [place] one drink equals one 12 oz bottle of beer (357m), one 5 oz glass of wine (1419m, or one 1 oz glass of hard liquor (4445m General instructions Maintain a healthy weight. Try to get at least 30 minutes of exercise on most days. Get treatment if you have sleep apnea. Do not use any products that contain nicotine or tobacco. These products include cigarettes, chewing tobacco, and vaping devices, such as e-cigarettes. If you need help quitting, ask your health care provider. Do not use drugs. Keep all follow-up visits. This is important. Where to find more information American Stroke Association: www.stroke.org Get help right away if: You have chest pain or an irregular heartbeat. You have any symptoms of a stroke. "BE FAST" is an easy way to remember the main warning signs of a stroke. B - Balance. Signs are dizziness, sudden trouble walking, or loss of balance. E - Eyes. Signs are trouble seeing or a sudden change in vision. F - Face. Signs are sudden weakness or numbness of the face, or the face or eyelid drooping on one side. A - Arms. Signs are weakness or numbness in an arm. This happens suddenly and usually on one side of the body. S - Speech. Signs are sudden trouble speaking, slurred speech, or trouble understanding what people say. T - Time. Time to call emergency services. Write down what time symptoms started. You have other signs of a stroke, such as: A sudden,  severe headache with no known cause. Nausea or vomiting. Seizure. These symptoms may represent a serious problem that is an emergency. Do not wait to see if the symptoms will go away. Get medical help right away. Call your local emergency services (911 in the [place] Do not drive yourself to the hospital. Summary A transient ischemic attack (TIA) happens when an artery in the head or neck is blocked. The blockage clears before there is any permanent brain damage. A TIA is a medical emergency. Symptoms of a TIA are the same as those of a stroke. The symptoms develop suddenly, and then go away quickly. Having a TIA means that you are at higher risk for a stroke. The goal of treatment is to reduce your risk for a stroke. Treatment may include medicines to thin the blood and changes to diet and lifestyle. This information is not intended to replace advice given to you by your health care provider. Make sure you discuss any questions you have with your health care provider. Document Revised: 11/10/2021 Document Reviewed: 02/04/2020 Elsevier Patient Education  [id number]

## 2022-03-25 ENCOUNTER — Encounter (HOSPITAL_BASED_OUTPATIENT_CLINIC_OR_DEPARTMENT_OTHER): Payer: Self-pay | Admitting: Internal Medicine

## 2022-04-11 ENCOUNTER — Other Ambulatory Visit: Payer: Self-pay

## 2022-04-11 MED ORDER — BUPROPION HCL ER (XL) 150 MG PO TB24: ORAL_TABLET | ORAL | 0 refills | Status: DC

## 2022-05-04 ENCOUNTER — Ambulatory Visit: Payer: BC Managed Care – PPO | Admitting: Nurse Practitioner

## 2022-05-04 ENCOUNTER — Encounter: Payer: Self-pay | Admitting: Internal Medicine

## 2022-05-04 ENCOUNTER — Ambulatory Visit (INDEPENDENT_AMBULATORY_CARE_PROVIDER_SITE_OTHER): Payer: BC Managed Care – PPO | Admitting: Nurse Practitioner

## 2022-05-04 ENCOUNTER — Encounter: Payer: Self-pay | Admitting: Nurse Practitioner

## 2022-05-04 VITALS — BP 108/70 | HR 61 | Temp 96.9°F | Ht 65.0 in | Wt 172.8 lb

## 2022-05-04 DIAGNOSIS — E538 Deficiency of other specified B group vitamins: Secondary | ICD-10-CM | POA: Diagnosis not present

## 2022-05-04 DIAGNOSIS — G988 Other disorders of nervous system: Secondary | ICD-10-CM

## 2022-05-04 DIAGNOSIS — Z0289 Encounter for other administrative examinations: Secondary | ICD-10-CM

## 2022-05-04 NOTE — Progress Notes (Signed)
Assessment and Plan:  Kimberly Zhang was seen today for an episodic visit.  Diagnoses and all order for this visit:  Encounter to obtain work note Work not provided until further work up evaluation completed.  Neurological disorder Workup pending. Continue to monitor  B12 deficiency Deferred with Neurology Monitor levels  - Vitamin B12  Notify office for further evaluation and treatment, questions or concerns if s/s fail to improve. The risks and benefits of my recommendations, as well as other treatment options were discussed with the patient today. Questions were answered.  Further disposition pending results of labs. Discussed med's effects and SE's.    Over 15 minutes of exam, counseling, chart review, and critical decision making was performed.   Future Appointments  Date Time Provider [place]  05/31/2022  8:45 AM Pixie Casino, MD [facility]  06/03/2022 10:00 AM Alycia Rossetti, NP [facility] None    ------------------------------------------------------------------------------------------------------------------   HPI BP 108/70   Pulse 61   Temp (!) 96.9 F (36.1 C)   Ht '5\' 5"'$  (1.651 m)   Wt 172 lb 12.8 oz (78.4 kg)   SpO2 98%   BMI 28.76 kg/m    56 y.o.female presents for request of work note of absence stating that Kimberly Zhang is no longer mentally capable to complete required work related activity and demand.  Kimberly Zhang had an initial consult with [facility], Dr. 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  Kimberly Zhang was asked to keep taking her bASA and cholesterol lowering medication.  B12 was offered to be checked but Kimberly Zhang has deferred to today's visit.  Kimberly Zhang was to sign a medical release to have the CD copies of the MRI scans from 11/2021 to be sent for review.  Kimberly Zhang is continuing to be worked up for stroke like symptoms and memory loss, tremor, trouble sleeping, excessive crying, fatigue,  with past diagnostics negative  for any significance to ongoing symptoms.   Kimberly Zhang states Kimberly Zhang is no longer to carr out her work duties.     Past Medical History:  Diagnosis Date   Allergy    Anxiety    Asthma    Benign hematuria 2011   Ottelin   Hyperlipidemia    Migraine    Vitamin D deficiency      Allergies  Allergen Reactions   Shellfish Allergy Anaphylaxis   Shellfish-Derived Products Anaphylaxis   Atorvastatin     Severe nausea   Rosuvastatin     Nausea, GI    Current Outpatient Medications on File Prior to Visit  Medication Sig   aspirin EC 81 MG tablet Take 1 tablet (81 mg total) by mouth daily. Swallow whole.   Atogepant (QULIPTA) 30 MG TABS Take 30 mg by mouth daily.   benzonatate (TESSALON) 100 MG capsule Take 1 capsule (100 mg total) by mouth 3 (three) times daily as needed.   buPROPion (WELLBUTRIN XL) 150 MG 24 hr tablet Take one tablet daily   busPIRone (BUSPAR) 5 MG tablet Take 1 tablet (5 mg total) by mouth 3 (three) times daily.   escitalopram (LEXAPRO) 20 MG tablet TAKE 1 TABLET BY MOUTH DAILY FOR MOOD (Patient taking differently: Take 20 mg by mouth daily.)   Evolocumab (REPATHA SURECLICK) 664 MG/ML SOAJ Inject 1 Dose into the skin every 14 (fourteen) days.   levocetirizine (XYZAL) 5 MG tablet TAKE 1 TABLET BY MOUTH DAILY FOR ALLERGIES (Patient taking differently: Take 5 mg by mouth daily.)   promethazine-dextromethorphan (PROMETHAZINE-DM) 6.25-15 MG/5ML syrup  Take 5 mLs by mouth 4 (four) times daily as needed.   topiramate (TOPAMAX) 100 MG tablet Take 100 mg by mouth daily.   UBRELVY 50 MG TABS TAKE 1 TABLET BY MOUTH AS NEEDED   verapamil (CALAN) 120 MG tablet Take 120 mg by mouth daily.   Vitamin D, Ergocalciferol, (DRISDOL) 1.25 MG (50000 UNIT) CAPS capsule TAKE 1 CAPSULE BY MOUTH 3 DAYS A WEEK (Patient taking differently: Take 50,000 Units by mouth. TAKE 1 CAPSULE BY MOUTH 3 DAYS A WEEK)   No current facility-administered medications on file prior to visit.    ROS: all negative  except what is noted in the HPI.   Physical Exam:  BP 108/70   Pulse 61   Temp (!) 96.9 F (36.1 C)   Ht '5\' 5"'$  (1.651 m)   Wt 172 lb 12.8 oz (78.4 kg)   SpO2 98%   BMI 28.76 kg/m   General Appearance: NAD.  Awake, conversant and cooperative. Eyes: PERRLA, EOMs intact.  Sclera white.  Conjunctiva without erythema. Sinuses: No frontal/maxillary tenderness.  No nasal discharge. Nares patent.  ENT/Mouth: Ext aud canals clear.  Bilateral TMs w/DOL and without erythema or bulging. Hearing intact.  Posterior pharynx without swelling or exudate.  Tonsils without swelling or erythema.  Neck: Supple.  No masses, nodules or thyromegaly. Respiratory: Effort is regular with non-labored breathing. Breath sounds are equal bilaterally without rales, rhonchi, wheezing or stridor.  Cardio: RRR with no MRGs. Brisk peripheral pulses without edema.  Abdomen: Active BS in all four quadrants.  Soft and non-tender without guarding, rebound tenderness, hernias or masses. Lymphatics: Non tender without lymphadenopathy.  Musculoskeletal: Full ROM, 5/5 strength, normal ambulation.  No clubbing or cyanosis. Skin: Appropriate color for ethnicity. Warm without rashes, lesions, ecchymosis, ulcers.  Neuro: CN II-XII grossly normal. Normal muscle tone without cerebellar symptoms and intact sensation.   Psych: AO X 3,  appropriate mood and affect, insight and judgment.     Darrol Jump, NP 4:37 PM [facility]

## 2022-05-05 LAB — VITAMIN B12: Vitamin B-12: 268 pg/mL (ref 200–1100)

## 2022-05-10 ENCOUNTER — Ambulatory Visit: Payer: BC Managed Care – PPO | Admitting: Nurse Practitioner

## 2022-05-10 ENCOUNTER — Other Ambulatory Visit: Payer: Self-pay | Admitting: Adult Health Nurse Practitioner

## 2022-05-10 DIAGNOSIS — G43809 Other migraine, not intractable, without status migrainosus: Secondary | ICD-10-CM

## 2022-05-11 ENCOUNTER — Ambulatory Visit: Payer: BC Managed Care – PPO | Admitting: Internal Medicine

## 2022-05-20 ENCOUNTER — Other Ambulatory Visit: Payer: Self-pay | Admitting: Nurse Practitioner

## 2022-05-20 DIAGNOSIS — F419 Anxiety disorder, unspecified: Secondary | ICD-10-CM

## 2022-05-31 ENCOUNTER — Encounter (HOSPITAL_BASED_OUTPATIENT_CLINIC_OR_DEPARTMENT_OTHER): Payer: Self-pay | Admitting: Internal Medicine

## 2022-05-31 ENCOUNTER — Ambulatory Visit (INDEPENDENT_AMBULATORY_CARE_PROVIDER_SITE_OTHER): Payer: BC Managed Care – PPO | Admitting: Internal Medicine

## 2022-05-31 VITALS — BP 110/70 | HR 64 | Ht 65.0 in | Wt 173.0 lb

## 2022-05-31 DIAGNOSIS — Z8673 Personal history of transient ischemic attack (TIA), and cerebral infarction without residual deficits: Secondary | ICD-10-CM

## 2022-05-31 DIAGNOSIS — Z789 Other specified health status: Secondary | ICD-10-CM

## 2022-05-31 DIAGNOSIS — E7801 Familial hypercholesterolemia: Secondary | ICD-10-CM

## 2022-05-31 NOTE — Patient Instructions (Signed)
Medication Instructions:  NO CHANGES -- will depend on lab results  *If you need a refill on your cardiac medications before your next appointment, please call your pharmacy*   Lab Work: NMR lipoprofile, LPa today   If you have labs (blood work) drawn today and your tests are completely normal, you will receive your results only by: [place] (if you have MyChart) OR A paper copy in the mail If you have any lab test that is abnormal or we need to change your treatment, we will call you to review the results.   Testing/Procedures: NONE   Follow-Up: At [facility], you and your health needs are our priority.  As part of our continuing mission to provide you with exceptional heart care, we have created designated Provider Care Teams.  These Care Teams include your primary Cardiologist (physician) and Advanced Practice Providers (APPs -  Physician Assistants and Nurse Practitioners) who all work together to provide you with the care you need, when you need it.  We recommend signing up for the patient portal called "MyChart".  Sign up information is provided on this After Visit Summary.  MyChart is used to connect with patients for Virtual Visits (Telemedicine).  Patients are able to view lab/test results, encounter notes, upcoming appointments, etc.  Non-urgent messages can be sent to your provider as well.   To learn more about what you can do with MyChart, go to [email or web address].    Your next appointment:    6 months with Dr. Debara Pickett -- call in February for a May appointment

## 2022-05-31 NOTE — Progress Notes (Signed)
LIPID CLINIC CONSULT NOTE  Chief Complaint:  Follow-up dyslipidemia  Primary Care Physician: Unk Pinto, MD  Primary Cardiologist:  None  HPI:  Kimberly Zhang is a 56 y.o. female who is being seen today for the evaluation of lipidemia at the request of Unk Pinto, MD.  This is a pleasant 56 year old female kindly referred for evaluation management of dyslipidemia.  She has a strong family history of high cholesterol in her mom and her sister who also had coronary artery disease and her son who has high cholesterol.  She also has a history of prior stroke and therefore her target LDL is less than 70.  Unfortunately she could not tolerate statins having tried atorvastatin and rosuvastatin both of which caused severe nausea and GI discomfort.  Her last lipids were in May 2023 showing total cholesterol 386, triglycerides 190, HDL 40 and LDL 309.  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  She reports his diet low in saturated fats.  05/31/2022  Ms. Straker returns today for follow-up. She reports that she has been using Repatha every 2 weeks.  No concerns regarding any side effects with the medication.  Unfortunately, she did not have labs repeated prior to this visit.  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  She reports she has had some short-term memory issues related to TIAs which is improving somewhat.  She understands that the E4 allele is associated with potential increased risk of Alzheimer's dementia that does run in her family.  PMHx:  Past Medical History:  Diagnosis Date   Allergy    Anxiety    Asthma    Benign hematuria 2011   Ottelin   Hyperlipidemia    Migraine    Vitamin D deficiency     Past Surgical History:   Procedure Laterality Date   ABDOMINAL HYSTERECTOMY  1998   COLONOSCOPY  2018   LEEP     POLYPECTOMY     TONSILECTOMY/ADENOIDECTOMY WITH MYRINGOTOMY      FAMHx:  Family History  Problem Relation Age of Onset   Liver disease Father    Hyperlipidemia Mother    Migraines Mother    Diabetes Paternal Grandmother    Heart disease Maternal Aunt    Cancer Maternal Aunt    Cancer Maternal Uncle    Depression Other    Alzheimer's disease Other    Healthy Sister    Healthy Son    Healthy Daughter    Colon cancer Neg Hx    Esophageal cancer Neg Hx    Rectal cancer Neg Hx    Stomach cancer Neg Hx    Colon polyps Neg Hx     SOCHx:   reports that she has been smoking cigarettes. She has never used smokeless tobacco. She reports current alcohol use. She reports that she does not use drugs.  ALLERGIES:  Allergies  Allergen Reactions   Shellfish Allergy Anaphylaxis   Shellfish-Derived Products Anaphylaxis   Atorvastatin     Severe nausea   Rosuvastatin     Nausea, GI    ROS: Pertinent items noted in HPI and remainder of comprehensive ROS otherwise negative.  HOME MEDS: Current Outpatient Medications on File Prior to Visit  Medication Sig Dispense Refill   aspirin EC 81 MG tablet Take 1 tablet (81 mg total)  by mouth daily. Swallow whole. 30 tablet 12   Atogepant (QULIPTA) 30 MG TABS Take 30 mg by mouth daily. 30 tablet 3   benzonatate (TESSALON) 100 MG capsule Take 1 capsule (100 mg total) by mouth 3 (three) times daily as needed. 30 capsule 0   buPROPion (WELLBUTRIN XL) 150 MG 24 hr tablet Take one tablet daily 90 tablet 0   busPIRone (BUSPAR) 5 MG tablet TAKE 1 TABLET(5 MG) BY MOUTH THREE TIMES DAILY 90 tablet 3   escitalopram (LEXAPRO) 20 MG tablet TAKE 1 TABLET BY MOUTH DAILY FOR MOOD (Patient taking differently: Take 20 mg by mouth daily.) 90 tablet 3   Evolocumab (REPATHA SURECLICK) 109 MG/ML SOAJ Inject 1 Dose into the skin every 14 (fourteen) days. 6 mL 3    levocetirizine (XYZAL) 5 MG tablet TAKE 1 TABLET BY MOUTH DAILY FOR ALLERGIES (Patient taking differently: Take 5 mg by mouth daily.) 90 tablet 1   promethazine-dextromethorphan (PROMETHAZINE-DM) 6.25-15 MG/5ML syrup Take 5 mLs by mouth 4 (four) times daily as needed. 118 mL 0   topiramate (TOPAMAX) 100 MG tablet Take 100 mg by mouth daily.     UBRELVY 50 MG TABS TAKE 1 TABLET BY MOUTH AS NEEDED 30 tablet 3   verapamil (CALAN) 120 MG tablet Take 120 mg by mouth daily.     verapamil (CALAN-SR) 120 MG CR tablet TAKE 1 TABLET(120 MG) BY MOUTH AT BEDTIME 90 tablet 3   Vitamin D, Ergocalciferol, (DRISDOL) 1.25 MG (50000 UNIT) CAPS capsule TAKE 1 CAPSULE BY MOUTH 3 DAYS A WEEK (Patient taking differently: Take 50,000 Units by mouth. TAKE 1 CAPSULE BY MOUTH 3 DAYS A WEEK) 36 capsule 1   No current facility-administered medications on file prior to visit.    LABS/IMAGING: No results found for this or any previous visit (from the past 48 hour(s)). No results found.  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   Ms. Gawlik has been tolerating PCSK9 inhibitor therapy for the past several months.  She needs repeat lipids.  Plan NMR and LP(a) today.  She might need  additional therapy for her high triglycerides which are rarely affected by the PCSK9 inhibitors although they have not been severely elevated in the past several years.  Follow-up with me in 6 months or sooner as necessary.  Pixie Casino, MD, [facility], [place] Director of the Advanced Lipid Disorders &  Cardiovascular Risk Reduction Clinic Diplomate of the American Board of Clinical Lipidology Attending Cardiologist  Direct Dial: [phone]  Fax: [phone]  Website:  [email or web address] Jinny Sweetland 05/31/2022, 8:50 AM

## 2022-06-01 LAB — NMR, LIPOPROFILE
Cholesterol, Total: 182 mg/dL (ref 100–199)
HDL Particle Number: 30.4 umol/L — ABNORMAL LOW (ref >=30.5)
HDL-C: 41 mg/dL (ref >39)
LDL Particle Number: 1333 nmol/L — ABNORMAL HIGH (ref <1000)
LDL Size: 21.2 nm (ref >20.5)
LDL-C (NIH Calc): 115 mg/dL — ABNORMAL HIGH (ref 0–99)
LP-IR Score: 63 — ABNORMAL HIGH (ref <=45)
Small LDL Particle Number: 480 nmol/L (ref <=527)
Triglycerides: 147 mg/dL (ref 0–149)

## 2022-06-01 LAB — LIPOPROTEIN A (LPA): Lipoprotein (a): 401.4 nmol/L — ABNORMAL HIGH (ref <75.0)

## 2022-06-01 NOTE — Progress Notes (Signed)
COMPLETE PHYSICAL  Assessment and Plan:  Encounter for general adult medical examination with abnormal findings 1 year  Hyperlipidemia Continue medications: Repatha and continue to follow with lipid clinic  check lipids, decrease fatty foods, increase activity.  -     Lipid panel [place] 15% cardiovascular risk assessment CBC CMP TSH  Abnormal Glucose Continue diet and exercise - A1c  Migraine Continue Qlipta and Ubrelvy Continue to follow with neurology  Neurological disorder/ Right sided weakness/ dysphasia/right hand tremor - Continue to follow with neurology - Paperwork filled out for short term disability  Obesity -     TSH - long discussion about weight loss, diet, and exercise   Benign hematuria Routine urine with reflex microscopic Microalbumin/creatinine urine ratio  Vitamin D deficiency Continue supplementation to maintain goal of 70-100 Taking Vitamin D 50,000IU three times a week. Pt prefer prescription, more consistent with taking than OTC. -     VITAMIN D 25 Hydroxy (Vit-D Deficiency, Fractures)  Medication management -     Magnesium  Breast pain , left Vit E daily to help tenderness, avoid caffeine Mammogram is upcoming  Flu vaccine need - FLU QUAD 6 MOS + PF IM given  Anxiety Continue Buspar and lexapro 20 mg for anxiety  Screening for hematuria or proteinuria -     Urinalysis, Routine w reflex microscopic (not at [facility]) -     Microalbumin / creatinine urine ratio  Screening for vaginal cancer - Pap taken and submitted  Screening for AAA - U/S ABD RETROPERITONEAL LTD  Screening for thyroid disorder - TSH  Screening for Ischemic heart disease EKG   Discussed med's effects and SE's. Screening labs and tests as requested with regular follow-up as recommended. Over 40 minutes of face to face interview, exam, counseling, chart review, and complex, high level critical decision making was performed this visit.   Future Appointments   Date Time Provider [place]  06/12/2023 10:00 AM Alycia Rossetti, NP [facility] None    HPI  56 y.o. female  presents for a complete physical and follow up for has Hyperlipidemia; Anxiety; Asthma; Migraine; Vitamin D deficiency; Benign hematuria; Female genuine stress incontinence; Obesity (BMI 30.0-34.9); B12 deficiency; and Transient ischemic attack (TIA) on their problem list..  She is s/p TIA with deficits. She is discharged from speech , OT and PT. She has had follow up with neurology and diagnosed TIA vs Complicated migraine. Continued to have mood lability and decreased strength in right hand. She has been having some Migraines and is using daily qlipta and Ubrelvy PRN. Her migraines have increased in frequency and is having at least 2 migraines a week/8 migraine days a month. She had been on Topamax for a very long time but migraines are now not controlled. Stopped Topamax this week to see if this helps cognition.    She is currently written off work.  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  She was responsible for reading doctors notes, coordinating referrals. She is unable to function currently in this role. This role was very high stress. She is continuing to be worked up for stroke like symptoms and memory loss, tremor, trouble sleeping, excessive crying, fatigue,  with past diagnostics negative for any significance to ongoing symptoms. She also has right hand tremor.Right sided weakness persists, completed CT.    Her blood pressure has been controlled at home without medication, today their BP is BP: 100/70 BP Readings from Last 3 Encounters:  06/03/22 100/70  05/31/22 110/70  05/04/22 108/70   She does workout, she walks. She denies chest pain, shortness of breath, dizziness.   She is not sleeping well at night, has vivid dreams.   She is on [place] for migraines  She is on cholesterol medication, she  was on crestor but stopped related to adverse side effects.  She is taking Repatha 140 mg  and tolerating well.  [place] risk assessment 15%. Not taking bASA.  Her cholesterol is not at goal. The cholesterol last visit was:   Lab Results  Component Value Date   CHOL 386 (H) 12/14/2021   HDL 40 (L) 12/14/2021   LDLCALC 309 (H) 12/14/2021   TRIG 190 (H) 12/14/2021   CHOLHDL 9.7 (H) 12/14/2021     Last A1C in the office was:  Lab Results  Component Value Date   HGBA1C 5.4 12/10/2021    Patient is on Vitamin D supplement, 5000 IU daily.   Lab Results  Component Value Date   VD25OH 18 (L) 12/26/2018     BMI is Body mass index is 30.96 kg/m., she is working on diet and exercise. She is eating a lot vegetable and fruits, drinks water. She is walking regularly. She limits saturated fats.  Wt Readings from Last 3 Encounters:  06/03/22 174 lb 12.8 oz (79.3 kg)  05/31/22 173 lb (78.5 kg)  05/04/22 172 lb 12.8 oz (78.4 kg)    Current Medications:  Current Outpatient Medications on File Prior to Visit  Medication Sig Dispense Refill   aspirin EC 81 MG tablet Take 1 tablet (81 mg total) by mouth daily. Swallow whole. 30 tablet 12   Atogepant (QULIPTA) 30 MG TABS Take 30 mg by mouth daily. 30 tablet 3   busPIRone (BUSPAR) 5 MG tablet TAKE 1 TABLET(5 MG) BY MOUTH THREE TIMES DAILY 90 tablet 3   escitalopram (LEXAPRO) 20 MG tablet TAKE 1 TABLET BY MOUTH DAILY FOR MOOD (Patient taking differently: Take 20 mg by mouth daily.) 90 tablet 3   Evolocumab (REPATHA SURECLICK) 944 MG/ML SOAJ Inject 1 Dose into the skin every 14 (fourteen) days. 6 mL 3   levocetirizine (XYZAL) 5 MG tablet TAKE 1 TABLET BY MOUTH DAILY FOR ALLERGIES (Patient taking differently: Take 5 mg by mouth daily.) 90 tablet 1   UBRELVY 50 MG TABS TAKE 1 TABLET BY MOUTH AS NEEDED 30 tablet 3   Vitamin D, Ergocalciferol, (DRISDOL) 1.25 MG (50000 UNIT) CAPS capsule TAKE 1 CAPSULE BY MOUTH 3 DAYS A WEEK (Patient taking differently:  Take 50,000 Units by mouth. TAKE 1 CAPSULE BY MOUTH 3 DAYS A WEEK) 36 capsule 1   benzonatate (TESSALON) 100 MG capsule Take 1 capsule (100 mg total) by mouth 3 (three) times daily as needed. (Patient not taking: Reported on 06/03/2022) 30 capsule 0   buPROPion (WELLBUTRIN XL) 150 MG 24 hr tablet Take one tablet daily (Patient not taking: Reported on 06/03/2022) 90 tablet 0   promethazine-dextromethorphan (PROMETHAZINE-DM) 6.25-15 MG/5ML syrup Take 5 mLs by mouth 4 (four) times daily as needed. (Patient not taking: Reported on 06/03/2022) 118 mL 0   topiramate (TOPAMAX) 100 MG tablet Take 100 mg by mouth daily. (Patient not taking: Reported on 06/03/2022)     verapamil (CALAN) 120 MG tablet Take 120 mg by mouth daily. (Patient not taking: Reported on 06/03/2022)     verapamil (CALAN-SR) 120 MG CR tablet TAKE 1 TABLET(120 MG) BY MOUTH AT BEDTIME (Patient not taking: Reported on 06/03/2022) 90 tablet  3   No current facility-administered medications on file prior to visit.   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   Health Maintenance:   Immunization History  Administered Date(s) Administered   DTaP 10/24/2010   Fluad Quad(high Dose 65+) 05/26/2019   Influenza Inj Mdck Quad With Preservative 05/02/2017, 05/10/2018, 06/03/2020   Influenza Split 05/11/2012, 05/13/2013   Influenza,inj,Quad PF,6+ Mos 05/26/2019, 05/26/2019, 06/03/2021   Influenza-Unspecified 05/11/2015   PFIZER(Purple Top)SARS-COV-2 Vaccination 08/19/2019, 09/09/2019, 07/10/2020   PPD Test 08/27/2013, 09/23/2014   Pneumococcal Polysaccharide-23  08/27/2013   Tdap 10/24/2010   Tetanus: 2012 Pneumovax: 2015 Prevnar 13: due age 40 Flu vaccine: 2018 Zostavax: N/A LMP: s/p TAH Pap:05/02/2017  MGM: 2016, Dr Garwin Brothers  DEXA: N/A Colonoscopy: 10/2016 CT head 2013  Patient Care Team: Unk Pinto, MD as PCP - General (Internal Medicine) Kathie Rhodes, MD (Inactive) as Consulting Physician (Urology)  Surgical History:  She has a past surgical history that includes LEEP; Abdominal hysterectomy (1998); Tonsilectomy/adenoidectomy with myringotomy; Colonoscopy (2018); and Polypectomy. Family History:  Herfamily history includes Alzheimer's disease in an other family member; Cancer in her maternal aunt and maternal uncle; Depression in an other family member; Diabetes in her paternal grandmother; Healthy in her daughter, sister, and son; Heart disease in her maternal aunt; Hyperlipidemia in her mother; Liver disease in her father; Migraines in her mother. Social History:  She reports that she has been smoking cigarettes. She has never used smokeless tobacco. She reports current alcohol use. She reports that she does not use drugs.  Review of Systems: Review of Systems  Constitutional:  Negative for chills, fever and malaise/fatigue.  HENT:  Negative for congestion, hearing loss, sinus pain, sore throat and tinnitus.   Eyes: Negative.  Negative for blurred vision and double vision.  Respiratory: Negative.  Negative for cough, hemoptysis, sputum production, shortness of breath and wheezing.   Cardiovascular: Negative.  Negative for chest pain, palpitations and leg swelling.  Gastrointestinal:  Positive for constipation. Negative for abdominal pain, diarrhea, heartburn, nausea and vomiting.  Genitourinary: Negative.  Negative for dysuria and urgency.       Some stress incontinence. Left breast tenderness  Musculoskeletal:  Negative for back pain, falls, joint pain, myalgias and neck pain.  Skin: Negative.  Negative for rash.   Neurological:  Positive for headaches. Negative for dizziness, tingling, tremors, sensory change, speech change, focal weakness, seizures, loss of consciousness and weakness.       Decreased strength right arm, tremor right hand  Endo/Heme/Allergies: Negative.  Does not bruise/bleed easily.  Psychiatric/Behavioral:  Negative for depression and suicidal ideas. The patient has insomnia. The patient is not nervous/anxious.     Physical Exam: Estimated body mass index is 30.96 kg/m as calculated from the following:   Height as of this encounter: '5\' 3"'$  (1.6 m).   Weight as of this encounter: 174 lb 12.8 oz (79.3 kg). BP 100/70   Pulse 66   Temp (!) 97.2 F (36.2 C)   Ht '5\' 3"'$  (1.6 m)   Wt 174 lb 12.8 oz (79.3 kg)   SpO2 96%   BMI 30.96 kg/m  General Appearance: Well nourished, in  no apparent distress.  Eyes: PERRLA, EOMs, conjunctiva no swelling or erythema, normal fundi and vessels.  Sinuses: No Frontal/maxillary tenderness  ENT/Mouth: Ext aud canals clear, normal light reflex with TMs without erythema, bulging. Good dentition. No erythema, swelling, or exudate on post pharynx. Tonsils not swollen or erythematous. Hearing normal.  Neck: Supple, thyroid normal. No bruits  Respiratory: Respiratory effort normal, BS equal bilaterally without rales, rhonchi, wheezing or stridor.  Cardio: RRR without murmurs, rubs or gallops. Brisk peripheral pulses without edema.  Chest: symmetric, with normal excursions and percussion.  Breasts: breasts appear normal, no suspicious masses, no skin or nipple changes or axillary nodes. Mild tenderness of left breast at mid breast directly beneath nipple- no mass felt Abdomen: Soft, nontender, no guarding, rebound, hernias, masses, or organomegaly.  Lymphatics: Non tender without lymphadenopathy.  Pelvic exam: normal external genitalia, vulva, vagina. Cervix/uterus surgically absent, no adnexal tenderness or enlargement , PAP: Pap smear done today, thin-prep  method.  Musculoskeletal: Full ROM all peripheral extremities,5/5 strength except right arm 4/5, and slow gait.  Skin: Warm, dry without rashes, lesions, ecchymosis. Skin tags noted, neck.(Make appointment if bothersome for removal). Neuro: Cranial nerves intact, reflexes equal bilaterally. Normal muscle tone, but 4/5 strength of right arm. Sensation intact.  Psych: Awake and oriented X 3, normal affect, Insight and Judgment appropriate.   EKG: Sinus bradycardia, no ST changes AAA: < 3 cm   [facility]  06/03/2022

## 2022-06-03 ENCOUNTER — Encounter: Payer: Self-pay | Admitting: Nurse Practitioner

## 2022-06-03 ENCOUNTER — Ambulatory Visit (INDEPENDENT_AMBULATORY_CARE_PROVIDER_SITE_OTHER): Payer: BC Managed Care – PPO | Admitting: Nurse Practitioner

## 2022-06-03 VITALS — BP 100/70 | HR 66 | Temp 97.2°F | Ht 63.0 in | Wt 174.8 lb

## 2022-06-03 DIAGNOSIS — F419 Anxiety disorder, unspecified: Secondary | ICD-10-CM

## 2022-06-03 DIAGNOSIS — I10 Essential (primary) hypertension: Secondary | ICD-10-CM

## 2022-06-03 DIAGNOSIS — Z0000 Encounter for general adult medical examination without abnormal findings: Secondary | ICD-10-CM

## 2022-06-03 DIAGNOSIS — E785 Hyperlipidemia, unspecified: Secondary | ICD-10-CM

## 2022-06-03 DIAGNOSIS — Z13220 Encounter for screening for lipoid disorders: Secondary | ICD-10-CM

## 2022-06-03 DIAGNOSIS — G988 Other disorders of nervous system: Secondary | ICD-10-CM

## 2022-06-03 DIAGNOSIS — Z136 Encounter for screening for cardiovascular disorders: Secondary | ICD-10-CM | POA: Diagnosis not present

## 2022-06-03 DIAGNOSIS — N644 Mastodynia: Secondary | ICD-10-CM

## 2022-06-03 DIAGNOSIS — R4702 Dysphasia: Secondary | ICD-10-CM

## 2022-06-03 DIAGNOSIS — N029 Recurrent and persistent hematuria with unspecified morphologic changes: Secondary | ICD-10-CM

## 2022-06-03 DIAGNOSIS — Z131 Encounter for screening for diabetes mellitus: Secondary | ICD-10-CM | POA: Diagnosis not present

## 2022-06-03 DIAGNOSIS — Z1272 Encounter for screening for malignant neoplasm of vagina: Secondary | ICD-10-CM

## 2022-06-03 DIAGNOSIS — E559 Vitamin D deficiency, unspecified: Secondary | ICD-10-CM | POA: Diagnosis not present

## 2022-06-03 DIAGNOSIS — Z23 Encounter for immunization: Secondary | ICD-10-CM | POA: Diagnosis not present

## 2022-06-03 DIAGNOSIS — I700 Atherosclerosis of aorta: Secondary | ICD-10-CM | POA: Diagnosis not present

## 2022-06-03 DIAGNOSIS — Z1329 Encounter for screening for other suspected endocrine disorder: Secondary | ICD-10-CM

## 2022-06-03 DIAGNOSIS — R531 Weakness: Secondary | ICD-10-CM

## 2022-06-03 DIAGNOSIS — Z79899 Other long term (current) drug therapy: Secondary | ICD-10-CM

## 2022-06-03 DIAGNOSIS — E669 Obesity, unspecified: Secondary | ICD-10-CM

## 2022-06-03 DIAGNOSIS — R7309 Other abnormal glucose: Secondary | ICD-10-CM

## 2022-06-03 DIAGNOSIS — R251 Tremor, unspecified: Secondary | ICD-10-CM

## 2022-06-03 DIAGNOSIS — Z1389 Encounter for screening for other disorder: Secondary | ICD-10-CM | POA: Diagnosis not present

## 2022-06-03 DIAGNOSIS — G43809 Other migraine, not intractable, without status migrainosus: Secondary | ICD-10-CM

## 2022-06-03 DIAGNOSIS — Z0001 Encounter for general adult medical examination with abnormal findings: Secondary | ICD-10-CM

## 2022-06-03 NOTE — Patient Instructions (Signed)

## 2022-06-04 LAB — COMPLETE METABOLIC PANEL WITH GFR
AG Ratio: 1.6 (calc) (ref 1.0–2.5)
ALT: 9 U/L (ref 6–29)
AST: 11 U/L (ref 10–35)
Albumin: 4 g/dL (ref 3.6–5.1)
Alkaline phosphatase (APISO): 102 U/L (ref 37–153)
BUN: 9 mg/dL (ref 7–25)
CO2: 27 mmol/L (ref 20–32)
Calcium: 9.2 mg/dL (ref 8.6–10.4)
Chloride: 107 mmol/L (ref 98–110)
Creat: 0.77 mg/dL (ref 0.50–1.03)
Globulin: 2.5 g/dL (calc) (ref 1.9–3.7)
Glucose, Bld: 91 mg/dL (ref 65–99)
Potassium: 4 mmol/L (ref 3.5–5.3)
Sodium: 141 mmol/L (ref 135–146)
Total Bilirubin: 0.5 mg/dL (ref 0.2–1.2)
Total Protein: 6.5 g/dL (ref 6.1–8.1)
eGFR: 90 mL/min/{1.73_m2} (ref >=60)

## 2022-06-04 LAB — HEMOGLOBIN A1C
Hgb A1c MFr Bld: 5.6 % of total Hgb (ref <5.7)
Mean Plasma Glucose: 114 mg/dL
eAG (mmol/L): 6.3 mmol/L

## 2022-06-04 LAB — MICROALBUMIN / CREATININE URINE RATIO
Creatinine, Urine: 180 mg/dL (ref 20–275)
Microalb Creat Ratio: 4 mcg/mg creat (ref <30)
Microalb, Ur: 0.8 mg/dL

## 2022-06-04 LAB — CBC WITH DIFFERENTIAL/PLATELET
Absolute Monocytes: 600 cells/uL (ref 200–950)
Basophils Absolute: 47 cells/uL (ref 0–200)
Basophils Relative: 0.6 %
Eosinophils Absolute: 142 cells/uL (ref 15–500)
Eosinophils Relative: 1.8 %
HCT: 42.1 % (ref 35.0–45.0)
Hemoglobin: 14.3 g/dL (ref 11.7–15.5)
Lymphs Abs: 3705 cells/uL (ref 850–3900)
MCH: 28.6 pg (ref 27.0–33.0)
MCHC: 34 g/dL (ref 32.0–36.0)
MCV: 84.2 fL (ref 80.0–100.0)
MPV: 8.8 fL (ref 7.5–12.5)
Monocytes Relative: 7.6 %
Neutro Abs: 3405 cells/uL (ref 1500–7800)
Neutrophils Relative %: 43.1 %
Platelets: 292 10*3/uL (ref 140–400)
RBC: 5 10*6/uL (ref 3.80–5.10)
RDW: 12.5 % (ref 11.0–15.0)
Total Lymphocyte: 46.9 %
WBC: 7.9 10*3/uL (ref 3.8–10.8)

## 2022-06-04 LAB — LIPID PANEL
Cholesterol: 194 mg/dL (ref <200)
HDL: 48 mg/dL — ABNORMAL LOW (ref >=50)
LDL Cholesterol (Calc): 120 mg/dL (calc) — ABNORMAL HIGH
Non-HDL Cholesterol (Calc): 146 mg/dL (calc) — ABNORMAL HIGH (ref <130)
Total CHOL/HDL Ratio: 4 (calc) (ref <5.0)
Triglycerides: 138 mg/dL (ref <150)

## 2022-06-04 LAB — URINALYSIS, ROUTINE W REFLEX MICROSCOPIC
Bacteria, UA: NONE SEEN /HPF
Bilirubin Urine: NEGATIVE
Glucose, UA: NEGATIVE
Hyaline Cast: NONE SEEN /LPF
Ketones, ur: NEGATIVE
Leukocytes,Ua: NEGATIVE
Nitrite: NEGATIVE
Protein, ur: NEGATIVE
Specific Gravity, Urine: 1.019 (ref 1.001–1.035)
WBC, UA: NONE SEEN /HPF (ref 0–5)
pH: 5.5 (ref 5.0–8.0)

## 2022-06-04 LAB — MAGNESIUM: Magnesium: 1.9 mg/dL (ref 1.5–2.5)

## 2022-06-04 LAB — TSH: TSH: 0.57 mIU/L (ref 0.40–4.50)

## 2022-06-04 LAB — VITAMIN D 25 HYDROXY (VIT D DEFICIENCY, FRACTURES): Vit D, 25-Hydroxy: 39 ng/mL (ref 30–100)

## 2022-06-04 LAB — MICROSCOPIC MESSAGE

## 2022-06-07 LAB — PAP, TP IMAGING W/ HPV RNA, RFLX HPV TYPE 16,18/45: HPV DNA High Risk: NOT DETECTED

## 2022-06-07 LAB — PAP, TP IMAGING, WNL RFLX HPV

## 2022-06-21 ENCOUNTER — Ambulatory Visit (INDEPENDENT_AMBULATORY_CARE_PROVIDER_SITE_OTHER): Payer: BC Managed Care – PPO | Admitting: Internal Medicine

## 2022-06-21 ENCOUNTER — Encounter: Payer: Self-pay | Admitting: Internal Medicine

## 2022-06-21 ENCOUNTER — Other Ambulatory Visit: Payer: Self-pay

## 2022-06-21 VITALS — BP 114/70 | HR 71 | Temp 97.2°F | Ht 63.0 in | Wt 172.0 lb

## 2022-06-21 DIAGNOSIS — J0140 Acute pansinusitis, unspecified: Secondary | ICD-10-CM | POA: Diagnosis not present

## 2022-06-21 DIAGNOSIS — R6889 Other general symptoms and signs: Secondary | ICD-10-CM | POA: Diagnosis not present

## 2022-06-21 DIAGNOSIS — J0410 Acute tracheitis without obstruction: Secondary | ICD-10-CM | POA: Diagnosis not present

## 2022-06-21 DIAGNOSIS — Z1152 Encounter for screening for COVID-19: Secondary | ICD-10-CM | POA: Diagnosis not present

## 2022-06-21 LAB — POCT INFLUENZA A/B
Influenza A, POC: NEGATIVE
Influenza B, POC: NEGATIVE

## 2022-06-21 LAB — POC COVID19 BINAXNOW: SARS Coronavirus 2 Ag: NEGATIVE

## 2022-06-21 MED ORDER — DEXAMETHASONE 4 MG PO TABS: ORAL_TABLET | ORAL | 0 refills | Status: DC

## 2022-06-21 MED ORDER — PSEUDOEPHEDRINE HCL ER 120 MG PO TB12: ORAL_TABLET | ORAL | 2 refills | Status: DC

## 2022-06-21 MED ORDER — AZITHROMYCIN 250 MG PO TABS: ORAL_TABLET | ORAL | 1 refills | Status: DC

## 2022-06-21 NOTE — Progress Notes (Signed)
Future Appointments  Date Time Provider Department  12/08/2022  9:30 AM Alycia Rossetti, NP [facility]  06/12/2023 10:00 AM Alycia Rossetti, NP [facility]    History of Present Illness:      Patient ids a very nice 56 yo single  BF with HTN, HLD, Pre-Diabetes and Vitamin D Deficiency who presents with a 5-6 day prodrome of head/chest congestion , discomfort over frontal /maxillary sinus areas and dry non productive cough. No fever, chills, rash or dyspnea.    Medications     Evolocumab (REPATHA SURECLICK) 778 MG/ML, Inject 1 Dose into the skin every 14 (fourteen) days.   levocetirizine (XYZAL) 5 MG tablet, TAKE 1 TABLET  DAILY FOR ALLERGIES   aspirin EC 81 MG tablet, Take 1 tablet  daily   Atogepant (QULIPTA) 30 MG TABS, Take  daily.   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   Observations/Objective:  BP 114/70   Pulse 71   Temp (!) 97.2 F (36.2 C)   Ht '5\' 3"'$  (1.6 m)   Wt 172 lb (78 kg)   SpO2 99%   BMI 30.47 kg/m   Dry brassy cough. Slight hoarseness.  HEENT -  EACs/ TMs - Nl. (+) tender over frontal & maxillary sinuses. N/O/P - clear Neck - supple.  Chest -  Few scattered rales . No rhonchi /wheezes.  Cor - Nl HS. RRR w/o sig M . MS- FROM w/o deformities.  Gait Nl. Neuro -  Nl w/o focal abnormalities.   Assessment and Plan:   1. Acute non-recurrent pansinusitis  - dexamethasone  4 MG tablet; Take 1 tab 3 x day - 3 days, then 2 x day - 3 days, then 1 tab daily   Dispense: 20 tablet  - azithromycin 250 MG tablet; Take 2 tablets with  Food on  Day 1, then 1 tablet Daily  Dispense: 6 each; Refill: 1  - pseudoephedrine 120 MG 12 hr tablet; Take   1 tablet    2 x /day Dispense: 20 tablet; Refill: 2   2. Tracheitis  - dexamethasone  4 MG tablet; Take 1 tab 3 x day - 3 days, then 2 x day - 3 days, then 1 tab daily   Dispense: 20 tablet  - azithromycin 250 MG tablet; Take 2 tablets with Food on  Day 1, then 1 tablet Daily  Dispense: 6 each; Refill: 1  - pseudoephedrine 120 MG 12 hr tablet; Take   1 tablet    2 x /day Dispense: 20 tablet; Refill: 2   3. Flu-like symptoms  - POCT Influenza A/B - Negative   4. Encounter for screening for COVID-19  - POC COVID-19 - Negative     Follow Up Instructions:        I discussed the assessment and treatment plan with the patient. The patient was provided an opportunity to ask questions and all were answered. The patient agreed with the plan and demonstrated an understanding of the instructions.       The patient was advised  to call back or seek an in-person evaluation if the symptoms worsen or if the condition fails to improve as anticipated.    Kirtland Bouchard, MD

## 2022-06-25 ENCOUNTER — Encounter: Payer: Self-pay | Admitting: Internal Medicine

## 2022-07-02 ENCOUNTER — Other Ambulatory Visit: Payer: Self-pay | Admitting: Nurse Practitioner

## 2022-07-02 DIAGNOSIS — G43809 Other migraine, not intractable, without status migrainosus: Secondary | ICD-10-CM

## 2022-09-07 ENCOUNTER — Other Ambulatory Visit: Payer: Self-pay | Admitting: Nurse Practitioner

## 2022-09-07 DIAGNOSIS — J301 Allergic rhinitis due to pollen: Secondary | ICD-10-CM

## 2022-10-03 ENCOUNTER — Other Ambulatory Visit: Payer: Self-pay | Admitting: Nurse Practitioner

## 2022-10-03 DIAGNOSIS — G43809 Other migraine, not intractable, without status migrainosus: Secondary | ICD-10-CM

## 2022-10-06 DIAGNOSIS — F444 Conversion disorder with motor symptom or deficit: Secondary | ICD-10-CM | POA: Diagnosis not present

## 2022-10-06 DIAGNOSIS — R2681 Unsteadiness on feet: Secondary | ICD-10-CM | POA: Diagnosis not present

## 2022-10-06 DIAGNOSIS — R531 Weakness: Secondary | ICD-10-CM | POA: Diagnosis not present

## 2022-10-13 DIAGNOSIS — R2681 Unsteadiness on feet: Secondary | ICD-10-CM | POA: Diagnosis not present

## 2022-10-13 DIAGNOSIS — R531 Weakness: Secondary | ICD-10-CM | POA: Diagnosis not present

## 2022-10-13 DIAGNOSIS — F444 Conversion disorder with motor symptom or deficit: Secondary | ICD-10-CM | POA: Diagnosis not present

## 2022-10-31 ENCOUNTER — Telehealth: Payer: Self-pay | Admitting: Nurse Practitioner

## 2022-10-31 ENCOUNTER — Other Ambulatory Visit: Payer: Self-pay | Admitting: Nurse Practitioner

## 2022-10-31 NOTE — Telephone Encounter (Signed)
Patient is requesting a refill on bupropion to [facility] on [address]

## 2022-11-03 DIAGNOSIS — R531 Weakness: Secondary | ICD-10-CM | POA: Diagnosis not present

## 2022-11-03 DIAGNOSIS — R2681 Unsteadiness on feet: Secondary | ICD-10-CM | POA: Diagnosis not present

## 2022-11-03 DIAGNOSIS — F444 Conversion disorder with motor symptom or deficit: Secondary | ICD-10-CM | POA: Diagnosis not present

## 2022-11-08 ENCOUNTER — Ambulatory Visit: Payer: BC Managed Care – PPO | Admitting: Diagnostic Neuroimaging

## 2022-11-10 DIAGNOSIS — R2681 Unsteadiness on feet: Secondary | ICD-10-CM | POA: Diagnosis not present

## 2022-11-10 DIAGNOSIS — F444 Conversion disorder with motor symptom or deficit: Secondary | ICD-10-CM | POA: Diagnosis not present

## 2022-11-10 DIAGNOSIS — R531 Weakness: Secondary | ICD-10-CM | POA: Diagnosis not present

## 2022-11-14 ENCOUNTER — Other Ambulatory Visit: Payer: Self-pay

## 2022-11-14 DIAGNOSIS — F419 Anxiety disorder, unspecified: Secondary | ICD-10-CM

## 2022-11-14 MED ORDER — ESCITALOPRAM OXALATE 20 MG PO TABS
20.0000 mg | ORAL_TABLET | Freq: Every day | ORAL | 0 refills | Status: DC
Start: 2022-11-14 — End: 2023-02-12

## 2022-12-07 NOTE — Progress Notes (Unsigned)
6 MONTH FOLLOW UP  Assessment and Plan:   Hyperlipidemia Continue medications: Repatha and continue to follow with lipid clinic Decrease fatty foods, increase activity.  [place] 15% cardiovascular risk assessment CBC CMP  Abnormal Glucose Continue diet and exercise - CMP  Migraine Continue Qlipta and Ubrelvy Continue to follow with neurology  Neurological disorder/ Right sided weakness/ dysphasia/right hand tremor - Continue to follow with neurology - Paperwork filled out for short term disability  Obesity -     TSH - long discussion about weight loss, diet, and exercise   Benign hematuria Routine urine with reflex microscopic Microalbumin/creatinine urine ratio  Vitamin D deficiency Continue supplementation to maintain goal of 70-100 Taking Vitamin D 50,000IU three times a week. Pt prefer prescription, more consistent with taking than OTC.  Medication management Continued  Anxiety Continue Buspar and lexapro 20 mg for anxiety     Discussed med's effects and SE's. Screening labs and tests as requested with regular follow-up as recommended. Over 40 minutes of face to face interview, exam, counseling, chart review, and complex, high level critical decision making was performed this visit.   Future Appointments  Date Time Provider Department Center  12/08/2022  9:30 AM Raynelle Dick, NP [facility] None  06/12/2023 10:00 AM Raynelle Dick, NP [facility] None    HPI  57 y.o. female  presents for a complete physical and follow up for has Hyperlipidemia; Anxiety; Asthma; Migraine; Vitamin D deficiency; Benign hematuria; Female genuine stress incontinence; Obesity (BMI 30.0-34.9); B12 deficiency; and Transient ischemic attack (TIA) on their problem list..  She is s/p TIA with deficits. She is discharged from speech , OT and PT. She has had follow up with neurology and diagnosed TIA vs Complicated migraine. Continued to have mood lability and decreased  strength in right hand. She has been having some Migraines and is using daily qlipta and Ubrelvy PRN. Her migraines have increased in frequency and is having at least 2 migraines a week/8 migraine days a month. She had been on Topamax for a very long time but migraines are now not controlled. Stopped Topamax this week to see if this helps cognition.    She is currently written off work.  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  She was responsible for reading doctors notes, coordinating referrals. She is unable to function currently in this role. This role was very high stress. She is continuing to be worked up for stroke like symptoms and memory loss, tremor, trouble sleeping, excessive crying, fatigue,  with past diagnostics negative for any significance to ongoing symptoms. She also has right hand tremor.Right sided weakness persists, completed CT.    Her blood pressure has been controlled at home without medication, today their BP is   BP Readings from Last 3 Encounters:  06/21/22 114/70  06/03/22 100/70  05/31/22 110/70   She does workout, she walks. She denies chest pain, shortness of breath, dizziness.   She is not sleeping well at night, has vivid dreams.   She is on [place] for migraines  She is on cholesterol medication, she was on crestor but stopped related to adverse side effects.  She is taking Repatha 140 mg  and tolerating well.  [place] risk assessment 15%. Not taking bASA.  Her cholesterol is not at goal. The cholesterol last visit was:   Lab Results  Component Value Date   CHOL 194 06/03/2022   HDL 48 (L) 06/03/2022  LDLCALC 120 (H) 06/03/2022   TRIG 138 06/03/2022   CHOLHDL 4.0 06/03/2022     Last A1C in the office was:  Lab Results  Component Value Date   HGBA1C 5.6 06/03/2022    Patient is on Vitamin D supplement, 5000 IU daily.   Lab Results  Component Value Date   VD25OH 18 (L) 12/26/2018      BMI is There is no height or weight on file to calculate BMI., she is working on diet and exercise. She is eating a lot vegetable and fruits, drinks water. She is walking regularly. She limits saturated fats.  Wt Readings from Last 3 Encounters:  06/21/22 172 lb (78 kg)  06/03/22 174 lb 12.8 oz (79.3 kg)  05/31/22 173 lb (78.5 kg)    Current Medications:  Current Outpatient Medications on File Prior to Visit  Medication Sig Dispense Refill   aspirin EC 81 MG tablet Take 1 tablet (81 mg total) by mouth daily. Swallow whole. 30 tablet 12   Atogepant (QULIPTA) 30 MG TABS TAKE 1 TABLET BY MOUTH DAILY 90 tablet 1   azithromycin (ZITHROMAX) 250 MG tablet Take 2 tablets with Food on  Day 1, then 1 tablet Daily with Food for Sinusitis / Bronchitis 6 each 1   buPROPion (WELLBUTRIN XL) 150 MG 24 hr tablet TAKE 1 TABLET BY MOUTH DAILY 90 tablet 0   busPIRone (BUSPAR) 5 MG tablet TAKE 1 TABLET(5 MG) BY MOUTH THREE TIMES DAILY 90 tablet 3   dexamethasone (DECADRON) 4 MG tablet Take 1 tab 3 x day - 3 days, then 2 x day - 3 days, then 1 tab daily 20 tablet 0   escitalopram (LEXAPRO) 20 MG tablet Take 1 tablet (20 mg total) by mouth daily. 90 tablet 0   Evolocumab (REPATHA SURECLICK) 140 MG/ML SOAJ Inject 1 Dose into the skin every 14 (fourteen) days. 6 mL 3   levocetirizine (XYZAL) 5 MG tablet TAKE 1 TABLET BY MOUTH DAILY FOR ALLERGIES 90 tablet 1   pseudoephedrine (SUDAFED) 120 MG 12 hr tablet Take   1 tablet    2 x /day (every 12 hours)    for Sinus & Chest Congestion 20 tablet 2   UBRELVY 50 MG TABS TAKE 1 TABLET BY MOUTH AS NEEDED 30 tablet 3   Vitamin D, Ergocalciferol, (DRISDOL) 1.25 MG (50000 UNIT) CAPS capsule TAKE 1 CAPSULE BY MOUTH 3 DAYS A WEEK (Patient taking differently: Take 50,000 Units by mouth. TAKE 1 CAPSULE BY MOUTH 3 DAYS A WEEK) 36 capsule 1   No current facility-administered medications on file prior to visit.   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   Health Maintenance:   Immunization History  Administered Date(s) Administered   DTaP 10/24/2010   Fluad Quad(high Dose 65+) 05/26/2019   Influenza Inj Mdck Quad With Preservative 05/02/2017, 05/10/2018, 06/03/2020   Influenza Split 05/11/2012, 05/13/2013   Influenza,inj,Quad PF,6+ Mos 05/26/2019, 05/26/2019, 06/03/2021, 06/03/2022   Influenza-Unspecified 05/11/2015   PFIZER(Purple Top)SARS-COV-2 Vaccination 08/19/2019, 09/09/2019, 07/10/2020   PPD Test 08/27/2013, 09/23/2014   Pneumococcal Polysaccharide-23 08/27/2013   Tdap 10/24/2010   Tetanus: 2012 Pneumovax: 2015 Prevnar 13:  due age 68 Flu vaccine: 2018 Zostavax: N/A LMP: s/p TAH Pap:05/02/2017  MGM: 2016, Dr Cherly Hensen  DEXA: N/A Colonoscopy: 10/2016 CT head 2013  Patient Care Team: Lucky Cowboy, MD as PCP - General (Internal Medicine) Ihor Gully, MD (Inactive) as Consulting Physician (Urology)  Surgical History:  She has a past surgical history that includes LEEP; Abdominal hysterectomy (1998); Tonsilectomy/adenoidectomy with myringotomy; Colonoscopy (2018); and Polypectomy. Family History:  Herfamily history includes Alzheimer's disease in an other family member; Cancer in her maternal aunt and maternal uncle; Depression in an other family member; Diabetes in her paternal grandmother; Healthy in her daughter, sister, and son; Heart disease in her maternal aunt; Hyperlipidemia in her mother; Liver disease in her father; Migraines in her mother. Social History:  She reports that she has been smoking cigarettes. She has never used smokeless tobacco. She  reports current alcohol use. She reports that she does not use drugs.  Review of Systems: Review of Systems  Constitutional:  Negative for chills, fever and malaise/fatigue.  HENT:  Negative for congestion, hearing loss, sinus pain, sore throat and tinnitus.   Eyes: Negative.  Negative for blurred vision and double vision.  Respiratory: Negative.  Negative for cough, hemoptysis, sputum production, shortness of breath and wheezing.   Cardiovascular: Negative.  Negative for chest pain, palpitations and leg swelling.  Gastrointestinal:  Positive for constipation. Negative for abdominal pain, diarrhea, heartburn, nausea and vomiting.  Genitourinary: Negative.  Negative for dysuria and urgency.       Some stress incontinence. Left breast tenderness  Musculoskeletal:  Negative for back pain, falls, joint pain, myalgias and neck pain.  Skin: Negative.  Negative for rash.  Neurological:  Positive for headaches. Negative for dizziness, tingling, tremors, sensory change, speech change, focal weakness, seizures, loss of consciousness and weakness.       Decreased strength right arm, tremor right hand  Endo/Heme/Allergies: Negative.  Does not bruise/bleed easily.  Psychiatric/Behavioral:  Negative for depression and suicidal ideas. The patient has insomnia. The patient is not nervous/anxious.     Physical Exam: Estimated body mass index is 30.47 kg/m as calculated from the following:   Height as of 06/21/22: 5\' 3"  (1.6 m).   Weight as of 06/21/22: 172 lb (78 kg). There were no vitals taken for this visit. General Appearance: Well nourished, in no apparent distress.  Eyes: PERRLA, EOMs, conjunctiva no swelling or erythema, normal fundi and vessels.  Sinuses: No Frontal/maxillary tenderness  ENT/Mouth: Ext aud canals clear, normal light reflex with TMs without erythema, bulging. Good dentition. No erythema, swelling, or exudate on post pharynx. Tonsils not swollen or erythematous. Hearing normal.   Neck: Supple, thyroid normal. No bruits  Respiratory: Respiratory effort normal, BS equal bilaterally without rales, rhonchi, wheezing or stridor.  Cardio: RRR without murmurs, rubs or gallops. Brisk peripheral pulses without edema.  Chest: symmetric, with normal excursions and percussion.  Breasts: breasts appear normal, no suspicious masses, no skin or nipple changes or axillary nodes. Mild tenderness of left breast at mid breast directly beneath nipple- no mass felt Abdomen: Soft, nontender, no guarding, rebound, hernias, masses, or organomegaly.  Lymphatics: Non tender without lymphadenopathy.  Pelvic exam: normal external genitalia, vulva, vagina. Cervix/uterus surgically absent, no adnexal tenderness or enlargement , PAP: Pap smear done today, thin-prep method.  Musculoskeletal: Full ROM all peripheral extremities,5/5 strength except right arm 4/5, and slow gait.  Skin: Warm, dry without rashes, lesions, ecchymosis. Skin tags noted, neck.(Make appointment if bothersome for removal). Neuro: Cranial nerves intact, reflexes  equal bilaterally. Normal muscle tone, but 4/5 strength of right arm. Sensation intact.  Psych: Awake and oriented X 3, normal affect, Insight and Judgment appropriate.   EKG: Sinus bradycardia, no ST changes AAA: < 3 cm   [facility]  12/07/2022

## 2022-12-08 ENCOUNTER — Encounter: Payer: Self-pay | Admitting: Nurse Practitioner

## 2022-12-08 ENCOUNTER — Ambulatory Visit (INDEPENDENT_AMBULATORY_CARE_PROVIDER_SITE_OTHER): Payer: BC Managed Care – PPO | Admitting: Nurse Practitioner

## 2022-12-08 VITALS — BP 118/80 | HR 68 | Temp 97.9°F | Resp 16 | Ht 63.0 in | Wt 163.8 lb

## 2022-12-08 DIAGNOSIS — E559 Vitamin D deficiency, unspecified: Secondary | ICD-10-CM

## 2022-12-08 DIAGNOSIS — R4702 Dysphasia: Secondary | ICD-10-CM

## 2022-12-08 DIAGNOSIS — Z79899 Other long term (current) drug therapy: Secondary | ICD-10-CM

## 2022-12-08 DIAGNOSIS — R7309 Other abnormal glucose: Secondary | ICD-10-CM

## 2022-12-08 DIAGNOSIS — G43809 Other migraine, not intractable, without status migrainosus: Secondary | ICD-10-CM

## 2022-12-08 DIAGNOSIS — N029 Recurrent and persistent hematuria with unspecified morphologic changes: Secondary | ICD-10-CM | POA: Diagnosis not present

## 2022-12-08 DIAGNOSIS — F419 Anxiety disorder, unspecified: Secondary | ICD-10-CM | POA: Diagnosis not present

## 2022-12-08 DIAGNOSIS — R251 Tremor, unspecified: Secondary | ICD-10-CM

## 2022-12-08 DIAGNOSIS — R531 Weakness: Secondary | ICD-10-CM

## 2022-12-08 DIAGNOSIS — E669 Obesity, unspecified: Secondary | ICD-10-CM

## 2022-12-08 DIAGNOSIS — G988 Other disorders of nervous system: Secondary | ICD-10-CM

## 2022-12-08 DIAGNOSIS — E785 Hyperlipidemia, unspecified: Secondary | ICD-10-CM | POA: Diagnosis not present

## 2022-12-08 LAB — CBC WITH DIFFERENTIAL/PLATELET
HCT: 44.1 % (ref 35.0–45.0)
MCH: 28.9 pg (ref 27.0–33.0)
Platelets: 307 10*3/uL (ref 140–400)
RBC: 5.06 10*6/uL (ref 3.80–5.10)
RDW: 12.4 % (ref 11.0–15.0)

## 2022-12-08 NOTE — Patient Instructions (Signed)

## 2022-12-09 LAB — URINALYSIS, ROUTINE W REFLEX MICROSCOPIC
Bilirubin Urine: NEGATIVE
Glucose, UA: NEGATIVE

## 2022-12-09 LAB — COMPLETE METABOLIC PANEL WITH GFR
AST: 12 U/L (ref 10–35)
Albumin: 4.1 g/dL (ref 3.6–5.1)
BUN/Creatinine Ratio: 9 (calc) (ref 6–22)
CO2: 27 mmol/L (ref 20–32)
Chloride: 107 mmol/L (ref 98–110)
Globulin: 2.2 g/dL (calc) (ref 1.9–3.7)
Sodium: 142 mmol/L (ref 135–146)
Total Protein: 6.3 g/dL (ref 6.1–8.1)

## 2022-12-09 LAB — LIPID PANEL: Triglycerides: 187 mg/dL — ABNORMAL HIGH (ref <150)

## 2022-12-09 LAB — CBC WITH DIFFERENTIAL/PLATELET
Basophils Absolute: 53 cells/uL (ref 0–200)
Hemoglobin: 14.6 g/dL (ref 11.7–15.5)
Lymphs Abs: 3192 cells/uL (ref 850–3900)
MCHC: 33.1 g/dL (ref 32.0–36.0)
Neutrophils Relative %: 48.2 %

## 2022-12-10 LAB — URINALYSIS, ROUTINE W REFLEX MICROSCOPIC
Nitrite: NEGATIVE
Specific Gravity, Urine: 1.02 (ref 1.001–1.035)
pH: 6 (ref 5.0–8.0)

## 2022-12-10 LAB — MICROSCOPIC MESSAGE

## 2022-12-10 LAB — COMPLETE METABOLIC PANEL WITH GFR
AG Ratio: 1.9 (calc) (ref 1.0–2.5)
ALT: 13 U/L (ref 6–29)
Alkaline phosphatase (APISO): 98 U/L (ref 37–153)
BUN: 6 mg/dL — ABNORMAL LOW (ref 7–25)
Calcium: 9.4 mg/dL (ref 8.6–10.4)
Creat: 0.67 mg/dL (ref 0.50–1.03)
Glucose, Bld: 77 mg/dL (ref 65–99)
Potassium: 4.3 mmol/L (ref 3.5–5.3)
Total Bilirubin: 0.5 mg/dL (ref 0.2–1.2)
eGFR: 102 mL/min/{1.73_m2} (ref >=60)

## 2022-12-10 LAB — CBC WITH DIFFERENTIAL/PLATELET
Absolute Monocytes: 570 cells/uL (ref 200–950)
Basophils Relative: 0.7 %
Eosinophils Absolute: 122 cells/uL (ref 15–500)
Eosinophils Relative: 1.6 %
MCV: 87.2 fL (ref 80.0–100.0)
MPV: 8.8 fL (ref 7.5–12.5)
Monocytes Relative: 7.5 %
Neutro Abs: 3663 cells/uL (ref 1500–7800)
Total Lymphocyte: 42 %
WBC: 7.6 10*3/uL (ref 3.8–10.8)

## 2022-12-10 LAB — LIPID PANEL
Cholesterol: 199 mg/dL (ref <200)
HDL: 48 mg/dL — ABNORMAL LOW (ref >=50)
LDL Cholesterol (Calc): 121 mg/dL (calc) — ABNORMAL HIGH
Non-HDL Cholesterol (Calc): 151 mg/dL (calc) — ABNORMAL HIGH (ref <130)
Total CHOL/HDL Ratio: 4.1 (calc) (ref <5.0)

## 2022-12-10 LAB — MICROALBUMIN / CREATININE URINE RATIO
Creatinine, Urine: 265 mg/dL (ref 20–275)
Microalb Creat Ratio: 9 mg/g creat (ref <30)
Microalb, Ur: 2.4 mg/dL

## 2022-12-30 ENCOUNTER — Other Ambulatory Visit: Payer: Self-pay | Admitting: Nurse Practitioner

## 2023-01-02 ENCOUNTER — Other Ambulatory Visit: Payer: Self-pay | Admitting: Nurse Practitioner

## 2023-01-02 DIAGNOSIS — G43701 Chronic migraine without aura, not intractable, with status migrainosus: Secondary | ICD-10-CM

## 2023-01-03 ENCOUNTER — Other Ambulatory Visit: Payer: Self-pay | Admitting: Nurse Practitioner

## 2023-01-03 DIAGNOSIS — F419 Anxiety disorder, unspecified: Secondary | ICD-10-CM

## 2023-01-16 ENCOUNTER — Other Ambulatory Visit: Payer: Self-pay | Admitting: Pharmacist

## 2023-01-16 MED ORDER — REPATHA SURECLICK 140 MG/ML ~~LOC~~ SOAJ: 140.0000 mg | SUBCUTANEOUS | 3 refills | Status: DC

## 2023-02-12 ENCOUNTER — Other Ambulatory Visit: Payer: Self-pay | Admitting: Nurse Practitioner

## 2023-02-12 DIAGNOSIS — F419 Anxiety disorder, unspecified: Secondary | ICD-10-CM

## 2023-04-08 ENCOUNTER — Other Ambulatory Visit: Payer: Self-pay | Admitting: Nurse Practitioner

## 2023-04-11 ENCOUNTER — Telehealth: Payer: Self-pay | Admitting: Pharmacy Technician

## 2023-04-11 ENCOUNTER — Telehealth: Payer: Self-pay | Admitting: Internal Medicine

## 2023-04-11 ENCOUNTER — Other Ambulatory Visit (HOSPITAL_COMMUNITY): Payer: Self-pay

## 2023-04-11 NOTE — Telephone Encounter (Signed)
Called patient left message on personal voice mail Repatha approved.

## 2023-04-11 NOTE — Telephone Encounter (Signed)
Pt c/o medication issue:  1. Name of Medication: Evolocumab (REPATHA SURECLICK) 140 MG/ML SOAJ   2. How are you currently taking this medication (dosage and times per day)?    3. Are you having a reaction (difficulty breathing--STAT)? no  4. What is your medication issue? Pharmacy is needed a prior auth for medication. Please advise

## 2023-04-11 NOTE — Telephone Encounter (Signed)
Pharmacy Patient Advocate Encounter  Received notification from [facility] that Prior Authorization for repatha has been APPROVED from 04/11/23 to 04/09/24. Ran test claim, Copay is $24.99. This test claim was processed through [facility]- copay amounts may vary at other pharmacies due to pharmacy/plan contracts, or as the patient moves through the different stages of their insurance plan.   PA #/Case ID/Reference #: [id number]

## 2023-04-11 NOTE — Telephone Encounter (Signed)
Pharmacy Patient Advocate Encounter   Received notification from CoverMyMeds that prior authorization for repatha is required/requested.   Insurance verification completed.   The patient is insured through [facility] .   Per test claim: PA required; PA submitted to [facility] via CoverMyMeds Key/confirmation #/EOC [id number] Status is pending

## 2023-04-11 NOTE — Telephone Encounter (Signed)
Message sent to our Pharm D.

## 2023-04-11 NOTE — Telephone Encounter (Signed)
PA request has been Approved. New Encounter created for follow up. For additional info see Pharmacy Prior Auth telephone encounter from 04/11/23.

## 2023-04-13 ENCOUNTER — Telehealth: Payer: Self-pay | Admitting: Internal Medicine

## 2023-04-13 NOTE — Telephone Encounter (Signed)
Please see encounter from 04/11/23 regarding prior authorization.

## 2023-04-13 NOTE — Telephone Encounter (Signed)
Caller [facility]) stated she is following up on patient's prior authorization.   Prescription# [phone]

## 2023-05-13 ENCOUNTER — Other Ambulatory Visit: Payer: Self-pay | Admitting: Nurse Practitioner

## 2023-05-13 DIAGNOSIS — F419 Anxiety disorder, unspecified: Secondary | ICD-10-CM

## 2023-05-13 DIAGNOSIS — G43809 Other migraine, not intractable, without status migrainosus: Secondary | ICD-10-CM

## 2023-06-12 ENCOUNTER — Encounter: Payer: BC Managed Care – PPO | Admitting: Nurse Practitioner

## 2023-06-19 ENCOUNTER — Telehealth: Payer: Self-pay | Admitting: Internal Medicine

## 2023-06-19 DIAGNOSIS — E7801 Familial hypercholesterolemia: Secondary | ICD-10-CM

## 2023-06-19 DIAGNOSIS — Z8673 Personal history of transient ischemic attack (TIA), and cerebral infarction without residual deficits: Secondary | ICD-10-CM

## 2023-06-19 NOTE — Telephone Encounter (Signed)
*  STAT* If patient is at the pharmacy, call can be transferred to refill team.   1. Which medications need to be refilled? (please list name of each medication and dose if known) Evolocumab (REPATHA SURECLICK) 140 MG/ML SOAJ   2. Which pharmacy/location (including street and city if local pharmacy) is medication to be sent to? [id number] [place] [place] - [address] AT [facility] [address] Phone: [phone]  Fax: [phone]      3. Do they need a 30 day or 90 day supply? Pt has made appt for 07/18/23, refill til then

## 2023-06-20 MED ORDER — REPATHA SURECLICK 140 MG/ML ~~LOC~~ SOAJ
140.0000 mg | SUBCUTANEOUS | 0 refills | Status: DC
Start: 2023-06-20 — End: 2023-06-26

## 2023-06-26 ENCOUNTER — Other Ambulatory Visit: Payer: Self-pay | Admitting: Family Medicine

## 2023-06-26 ENCOUNTER — Telehealth: Payer: Self-pay

## 2023-06-26 ENCOUNTER — Encounter: Payer: Self-pay | Admitting: Family Medicine

## 2023-06-26 ENCOUNTER — Ambulatory Visit: Payer: Medicaid Other | Admitting: Family Medicine

## 2023-06-26 VITALS — BP 116/74 | HR 66 | Temp 97.8°F | Ht 63.0 in | Wt 178.0 lb

## 2023-06-26 DIAGNOSIS — G43809 Other migraine, not intractable, without status migrainosus: Secondary | ICD-10-CM

## 2023-06-26 DIAGNOSIS — F419 Anxiety disorder, unspecified: Secondary | ICD-10-CM

## 2023-06-26 DIAGNOSIS — G8929 Other chronic pain: Secondary | ICD-10-CM

## 2023-06-26 DIAGNOSIS — M25511 Pain in right shoulder: Secondary | ICD-10-CM

## 2023-06-26 DIAGNOSIS — E7801 Familial hypercholesterolemia: Secondary | ICD-10-CM

## 2023-06-26 DIAGNOSIS — E785 Hyperlipidemia, unspecified: Secondary | ICD-10-CM | POA: Diagnosis not present

## 2023-06-26 DIAGNOSIS — G43701 Chronic migraine without aura, not intractable, with status migrainosus: Secondary | ICD-10-CM

## 2023-06-26 DIAGNOSIS — Z8673 Personal history of transient ischemic attack (TIA), and cerebral infarction without residual deficits: Secondary | ICD-10-CM | POA: Diagnosis not present

## 2023-06-26 DIAGNOSIS — M2559 Pain in other specified joint: Secondary | ICD-10-CM | POA: Diagnosis not present

## 2023-06-26 DIAGNOSIS — Z1231 Encounter for screening mammogram for malignant neoplasm of breast: Secondary | ICD-10-CM

## 2023-06-26 MED ORDER — ESCITALOPRAM OXALATE 10 MG PO TABS
10.0000 mg | ORAL_TABLET | Freq: Every day | ORAL | 0 refills | Status: DC
Start: 2023-06-26 — End: 2023-07-24

## 2023-06-26 MED ORDER — REPATHA SURECLICK 140 MG/ML ~~LOC~~ SOAJ: 140.0000 mg | SUBCUTANEOUS | 0 refills | Status: DC

## 2023-06-26 MED ORDER — UBRELVY 50 MG PO TABS: 1.0000 | ORAL_TABLET | ORAL | 0 refills | Status: DC | PRN

## 2023-06-26 MED ORDER — QULIPTA 30 MG PO TABS: 1.0000 | ORAL_TABLET | Freq: Every day | ORAL | 1 refills | Status: DC

## 2023-06-26 NOTE — Telephone Encounter (Signed)
Alternative requested

## 2023-06-26 NOTE — Progress Notes (Signed)
New Patient Office Visit  Subjective    Patient ID: Kimberly Zhang, female    DOB: Oct 10, 1965  Age: 56 y.o. MRN: [id number]  CC:  Chief Complaint  Patient presents with   New Patient (Initial Visit)    Pt stated she had a stroke in may 2023, still having the after effects of it. Tremor on the right side, pain during the night from the right thigh area to the feet     HPI  Kimberly Zhang presents to establish care today. Has complex medical history including likely stroke in May 2023, she still sees neurology. Requesting refills on migraine medications, Repatha, Lexapro today. Reports that she has not had Lexapro in a couple of months. Reports that she still has residual symptoms after her stroke in May 2023 including tremors to the right side of her arm and leg, some neuropathy, some trouble speaking and some memory loss issues.  She has worked with ST, OT, PT in the past.  Reports right shoulder pain concentrated over the deltoid, worse with movement.  Has not attempted to treat at home. Has not been to PT in "a while."  Willing to try this again.  Reports that [facility]      Outpatient Encounter Medications as of 06/26/2023  Medication Sig   escitalopram (LEXAPRO) 10 MG tablet Take 1 tablet (10 mg total) by mouth daily.   levocetirizine (XYZAL) 5 MG tablet TAKE 1 TABLET BY MOUTH DAILY FOR ALLERGIES   Vitamin D, Ergocalciferol, (DRISDOL) 1.25 MG (50000 UNIT) CAPS capsule TAKE 1 CAPSULE BY MOUTH 3 DAYS A WEEK (Patient taking differently: Take 50,000 Units by mouth. TAKE 1 CAPSULE BY MOUTH 3 DAYS A WEEK)   [DISCONTINUED] Atogepant (QULIPTA) 30 MG TABS TAKE 1 TABLET BY MOUTH DAILY   [DISCONTINUED] buPROPion (WELLBUTRIN XL) 150 MG 24 hr tablet TAKE 1 TABLET BY MOUTH DAILY   [DISCONTINUED] busPIRone (BUSPAR) 5 MG tablet TAKE 1 TABLET(5 MG) BY MOUTH THREE TIMES DAILY   [DISCONTINUED] escitalopram (LEXAPRO) 20 MG tablet TAKE 1  TABLET(20 MG) BY MOUTH DAILY   [DISCONTINUED] Evolocumab (REPATHA SURECLICK) 140 MG/ML SOAJ Inject 140 mg into the skin every 14 (fourteen) days.   [DISCONTINUED] Ubrogepant (UBRELVY) 50 MG TABS TAKE 1 TABLET BY MOUTH AS NEEDED   Atogepant (QULIPTA) 30 MG TABS Take 1 tablet (30 mg total) by mouth daily.   Evolocumab (REPATHA SURECLICK) 140 MG/ML SOAJ Inject 140 mg into the skin every 14 (fourteen) days.   Ubrogepant (UBRELVY) 50 MG TABS Take 1 tablet (50 mg total) by mouth as needed.   No facility-administered encounter medications on file as of 06/26/2023.    Past Medical History:  Diagnosis Date   Allergy    Anxiety    Asthma    Benign hematuria 2011   [place]   Hyperlipidemia    Migraine    Vitamin D deficiency     Past Surgical History:  Procedure Laterality Date   ABDOMINAL HYSTERECTOMY  1998   COLONOSCOPY  2018   LEEP     POLYPECTOMY     TONSILECTOMY/ADENOIDECTOMY WITH MYRINGOTOMY      Family History  Problem Relation Age of Onset   Liver disease Father    Hyperlipidemia Mother    Migraines Mother    Diabetes Paternal Grandmother    Heart disease Maternal Aunt    Cancer Maternal Aunt    Cancer Maternal Uncle    Depression Other  Alzheimer's disease Other    Healthy Sister    Healthy Son    Healthy Daughter    Colon cancer Neg Hx    Esophageal cancer Neg Hx    Rectal cancer Neg Hx    Stomach cancer Neg Hx    Colon polyps Neg Hx     Social History   Socioeconomic History   Marital status: Married    Spouse name: Not on file   Number of children: Not on file   Years of education: Not on file   Highest education level: Bachelor's degree (e.g., BA, AB, BS)  Occupational History   Not on file  Tobacco Use   Smoking status: Some Days    Current packs/day: 0.00    Types: Cigarettes    Last attempt to quit: 09/23/2015    Years since quitting: 7.7   Smokeless tobacco: Never  Vaping Use   Vaping status: Never Used  Substance and Sexual Activity    Alcohol use: Yes    Alcohol/week: 0.0 standard drinks of alcohol    Comment: twice yearly   Drug use: No   Sexual activity: Not on file  Other Topics Concern   Not on file  Social History Narrative   ** Merged History Encounter **       She works as a Paramedic level of education:  Automotive engineer   She lives alone.     Social Determinants of Health   Financial Resource Strain: Medium Risk (06/25/2023)   Overall Financial Resource Strain (CARDIA)    Difficulty of Paying Living Expenses: Somewhat hard  Food Insecurity: Patient Declined (06/25/2023)   Hunger Vital Sign    Worried About Running Out of Food in the Last Year: Patient declined    Ran Out of Food in the Last Year: Patient declined  Transportation Needs: No Transportation Needs (06/25/2023)   PRAPARE - Administrator, Civil Service (Medical): No    Lack of Transportation (Non-Medical): No  Physical Activity: Insufficiently Active (06/25/2023)   Exercise Vital Sign    Days of Exercise per Week: 3 days    Minutes of Exercise per Session: 30 min  Stress: No Stress Concern Present (06/25/2023)   [employer] of Occupational Health - Occupational Stress Questionnaire    Feeling of Stress : Only a little  Social Connections: Socially Integrated (06/25/2023)   Social Connection and Isolation Panel [NHANES]    Frequency of Communication with Friends and Family: More than three times a week    Frequency of Social Gatherings with Friends and Family: Three times a week    Attends Religious Services: More than 4 times per year    Active Member of Clubs or Organizations: Yes    Attends Engineer, structural: More than 4 times per year    Marital Status: Married  Catering manager Violence: Not on file    ROS Per HPI      Objective    BP 116/74   Pulse 66   Temp 97.8 F (36.6 C) (Temporal)   Ht 5\' 3"  (1.6 m)   Wt 178 lb (80.7 kg)   SpO2 98%   BMI 31.53 kg/m   Physical Exam Vitals and  nursing note reviewed.  Constitutional:      Appearance: She is obese.  HENT:     Head: Normocephalic and atraumatic.  Eyes:     Extraocular Movements: Extraocular movements intact.     Pupils: Pupils are equal, round, and  reactive to light.  Cardiovascular:     Rate and Rhythm: Normal rate and regular rhythm.     Heart sounds: Normal heart sounds.  Pulmonary:     Effort: Pulmonary effort is normal.     Breath sounds: Normal breath sounds.  Musculoskeletal:        General: Swelling and tenderness present.     Cervical back: Normal range of motion.     Comments: LROM to R trapezius, R deltoid. No erythema, no bruising noted  Neurological:     Mental Status: She is alert and oriented to person, place, and time. Mental status is at baseline.     Comments: Tremor to R arm, hand, R leg, foot. Slower speech, fluent thought        Assessment & Plan:   Other migraine without status migrainosus, not intractable -     Bernita Raisin; Take 1 tablet (50 mg total) by mouth as needed.  Dispense: 90 tablet; Refill: 0 -     Qulipta; Take 1 tablet (30 mg total) by mouth daily.  Dispense: 90 tablet; Refill: 1  Hyperlipidemia, unspecified hyperlipidemia type  Chronic migraine without aura with status migrainosus, not intractable Bernita Raisin; Take 1 tablet (50 mg total) by mouth as needed.  Dispense: 90 tablet; Refill: 0 -     Qulipta; Take 1 tablet (30 mg total) by mouth daily.  Dispense: 90 tablet; Refill: 1  Familial hypercholesterolemia -     Repatha SureClick; Inject 140 mg into the skin every 14 (fourteen) days.  Dispense: 2 mL; Refill: 0  History of stroke -     Repatha SureClick; Inject 140 mg into the skin every 14 (fourteen) days.  Dispense: 2 mL; Refill: 0  Anxiety tension state -     Escitalopram Oxalate; Take 1 tablet (10 mg total) by mouth daily.  Dispense: 30 tablet; Refill: 0  Encounter for screening mammogram for malignant neoplasm of breast -     3D Screening Mammogram, Left  and Right; Future  Pain in other joint -     Ambulatory referral to Physical Therapy  Chronic right shoulder pain -     Ambulatory referral to Physical Therapy     Return in 4 weeks (on 07/24/2023) for CPE.   Moshe Cipro, FNP

## 2023-06-26 NOTE — Telephone Encounter (Signed)
Pt wanting to be schedule for the mammogram (bus)

## 2023-06-27 ENCOUNTER — Other Ambulatory Visit: Payer: Self-pay | Admitting: Family Medicine

## 2023-06-27 DIAGNOSIS — G43809 Other migraine, not intractable, without status migrainosus: Secondary | ICD-10-CM

## 2023-06-27 DIAGNOSIS — G43701 Chronic migraine without aura, not intractable, with status migrainosus: Secondary | ICD-10-CM

## 2023-07-07 ENCOUNTER — Telehealth: Payer: Self-pay

## 2023-07-07 NOTE — Telephone Encounter (Signed)
*  Primary  Pharmacy Patient Advocate Encounter   Received notification from CoverMyMeds that prior authorization for Ubrelvy 50MG  tablets  is required/requested.   Insurance verification completed.   The patient is insured through [facility] .   Per test claim: PA required; PA submitted to above mentioned insurance via CoverMyMeds Key/confirmation #/EOC [facility] Status is pending

## 2023-07-10 NOTE — Telephone Encounter (Signed)
Pharmacy Patient Advocate Encounter  Received notification from [facility] that Prior Authorization for Ubrelvy 50mg  tabs has been DENIED.  No reason given; No denial letter received via Fax or CMM. It has been requested and will be uploaded to the media tab once received.   PA #/Case ID/Reference #: [id number]

## 2023-07-11 NOTE — Therapy (Signed)
OUTPATIENT PHYSICAL THERAPY SHOULDER EVALUATION   Patient Name: Kimberly Zhang MRN: [id number] DOB:June 16, 1966, 57 y.o., female Today's Date: 07/12/2023  END OF SESSION:  PT End of Session - 07/12/23 1100     Visit Number 1    Number of Visits 17    Date for PT Re-Evaluation 09/06/23    Authorization Type [employer]    Authorization Time Period auth after 27 visits per eval appt note    PT Start Time 1101    PT Stop Time 1142    PT Time Calculation (min) 41 min    Activity Tolerance Patient tolerated treatment well             Past Medical History:  Diagnosis Date   Allergy    Anxiety    Asthma    Benign hematuria 2011   [place]   Hyperlipidemia    Migraine    Vitamin D deficiency    Past Surgical History:  Procedure Laterality Date   ABDOMINAL HYSTERECTOMY  1998   COLONOSCOPY  2018   LEEP     POLYPECTOMY     TONSILECTOMY/ADENOIDECTOMY WITH MYRINGOTOMY     Patient Active Problem List   Diagnosis Date Noted   Transient ischemic attack (TIA) 12/10/2021   B12 deficiency 04/21/2021   Obesity (BMI 30.0-34.9) 12/01/2015   Female genuine stress incontinence 10/20/2014   Benign hematuria    Hyperlipidemia    Anxiety    Asthma    Migraine    Vitamin D deficiency     PCP: Moshe Cipro, FNP  REFERRING PROVIDER: Moshe Cipro, FNP  REFERRING DIAG: M25.59 (ICD-10-CM) - Pain in other joint M25.511,G89.29 (ICD-10-CM) - Chronic right shoulder pain  THERAPY DIAG:  Chronic right shoulder pain  Muscle weakness (generalized)  Stiffness of right shoulder, not elsewhere classified  Rationale for Evaluation and Treatment: Rehabilitation  ONSET DATE: 2023  SUBJECTIVE:                                                                                                                                                                                      SUBJECTIVE STATEMENT: Endorses continued R sided weakness from reported stroke in 2023, R sided  tremor, R shoulder pain, sharp pain down leg into foot. Symptoms fairly stable at this point, pt denies any recent worsening or improvement.  Pt endorses R shoulder/neck pain that is described as tightness, sometimes spasming. Difficulty lifting arm and performing ADLs. Weakness with gripping objects. Does have tingling in fingers/arm, states she still has tremors in arm as well. States tingling and tremors feel separate from shoulder pain. Does report some occasional dizziness, especially in mornings although this is variable,  which she states she is communicating with PCP/neurology about.  Hand dominance: Right  PERTINENT HISTORY: anxiety, asthma, migraines Reports stroke 2023 with residual deficits  PAIN:  Are you having pain: 3/10 Location/description: R shoulder/neck, tightness worst over past week: 10/10, fades slowly   - aggravating factors: manipulating objects (pots/pans), lifting arm, lying on R side, avoiding carrying - Easing factors: rest     PRECAUTIONS: R sided weakness/tremor   WEIGHT BEARING RESTRICTIONS: No  FALLS:  Has patient fallen in last 6 months? Yes. Number of falls 2 falls - reports balance issues since stroke. 1 fall coming down steps, 1 fall in family room where she felt dizzy   LIVING ENVIRONMENT: 2 story home, requires stair navigation. Does better with ascending than descending. No STE. Has rail inside. 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  DIAGNOSTIC FINDINGS:  No recent shoulder imaging in chart  PATIENT SURVEYS:  FOTO 55 > 64  COGNITION: Overall cognitive status: Within functional limits for tasks assessed     SENSATION: Light touch intact/symmetrical BUE although pt  does endorse tingling/tremor in RUE   POSTURE: Forward head, rounded shoulders  UPPER EXTREMITY ROM:  A/PROM Right eval Left eval  Shoulder flexion 98 deg * 162 deg  Shoulder abduction 82 deg * 150 deg  Shoulder internal rotation    Shoulder external rotation (functional) (About parallel with ear) T3  Elbow flexion    Elbow extension    Wrist flexion    Wrist extension     (Blank rows = not tested) (Key: WFL = within functional limits not formally assessed, * = concordant pain, s = stiffness/stretching sensation, NT = not tested)  Comments:    UPPER EXTREMITY MMT:  MMT Right eval Left eval  Shoulder flexion  4+  Shoulder extension  4  Shoulder abduction    Shoulder extension    Shoulder internal rotation 3+ painless 4  Shoulder external rotation 3+ painless  4  Elbow flexion    Elbow extension    Grip strength 15# 50#  (Blank rows = not tested)  (Key: WFL = within functional limits not formally assessed, * = concordant pain, s = stiffness/stretching sensation, NT = not tested)  Comments:    PALPATION:  Quite tender throughout R UT, LS, rhomboid, mid trap, infraspinatus, teres, deltoid. Does report some relief with manual pressure as well   TODAY'S TREATMENT:                                                                                                                                         [facility] Adult PT Treatment:  DATE: 07/12/23 Therapeutic Exercise: Scapular retractions practice reps cues for form, comfortable ROM Towel gripping practice reps cues for pacing HEP handout + education    PATIENT EDUCATION: Education details: Pt education on PT impairments, prognosis, and POC. Informed consent. Rationale for interventions, safe/appropriate HEP performance Person educated: Patient Education method: Explanation, Demonstration, Tactile cues, Verbal cues, and Handouts Education comprehension: verbalized understanding,  returned demonstration, verbal cues required, tactile cues required, and needs further education    HOME EXERCISE PROGRAM: Access Code: [email or web address] URL: [email or web address] Date: 07/12/2023 Prepared by: Fransisco Hertz  Exercises - Seated Scapular Retraction  - 2-3 x daily - 1 sets - 8-10 reps - Seated Gripping Towel  - 2-3 x daily - 1 sets - 8-10 reps  ASSESSMENT:  CLINICAL IMPRESSION: Patient is a pleasant 57 y.o. woman who was seen today for physical therapy evaluation and treatment for R shoulder pain. She states this has been consistent since reported stroke in 2023, reports residual tremor in RUE, N/T, and RLE pain. Pt is R hand dominant and reports modifications/difficulty with majority of daily tasks. On exam she demonstrates reduced GH mobility, reduced rotator cuff strength, and reduced grip strength on affected limb. Tolerates exam and HEP well overall, no adverse events. Recommend trial of skilled PT to address aforementioned deficits with aim of improving functional tolerance and reducing pain with typical activities. Pt departs today's session in no acute distress, all voiced concerns/questions addressed appropriately from PT perspective.    OBJECTIVE IMPAIRMENTS: decreased activity tolerance, decreased endurance, decreased mobility, decreased ROM, decreased strength, impaired perceived functional ability, impaired UE functional use, postural dysfunction, and pain.   ACTIVITY LIMITATIONS: carrying, lifting, sleeping, bathing, dressing, and hygiene/grooming  PARTICIPATION LIMITATIONS: meal prep, cleaning, laundry, driving, community activity, and occupation  PERSONAL FACTORS: Time since onset of injury/illness/exacerbation and 3+ comorbidities: anxiety, asthma, migraines, hx CVA  are also affecting patient's functional outcome.   REHAB POTENTIAL: Good  CLINICAL DECISION MAKING: Stable/uncomplicated  EVALUATION COMPLEXITY: Low   GOALS: Goals reviewed with  patient? Yes  SHORT TERM GOALS: Target date: 08/09/2023 Pt will demonstrate appropriate understanding and performance of initially prescribed HEP in order to facilitate improved independence with management of symptoms.  Baseline: HEP provided on eval Goal status: INITIAL   2. Pt will score greater than or equal to 60 on FOTO in order to demonstrate improved perception of function due to symptoms.  Baseline: 55  Goal status: INITIAL    LONG TERM GOALS: Target date: 09/06/2023 Pt will score 64 on FOTO in order to demonstrate improved perception of function due to symptoms. Baseline: 55 Goal status: INITIAL  2.  Pt will demonstrate at least 140 degrees of active shoulder elevation on RUE in order to demonstrate improved tolerance to functional movement patterns such as reaching overhead. Baseline: see ROM chart above Goal status: INITIAL  3.  Pt will demonstrate at least 4+/5 shoulder ER/IR MMT on RUE for improved symmetry of UE strength and improved tolerance to functional movements.  Baseline: see MMT chart above Goal status: INITIAL  4. Pt will report ability to perform upper body dressing with less than 2 point increase in pain on NPS in order to indicate improved tolerance/independence with ADLs.   Baseline: increased pain, requiring modification  Goal status: INITIAL   5. Pt will report at least 50% decrease in overall pain levels in past week in order to facilitate improved tolerance to basic ADLs/mobility.   Baseline: up to 10/10  Goal status: INITIAL    6. Pt  will demonstrate at least 30# grip strength on RUE in order to facilitate improved functional strength.  Baseline: see MMT chart above  Goal status: INITIAL   PLAN:  PT FREQUENCY: 1-2x/week  PT DURATION: 8 weeks  PLANNED INTERVENTIONS: 97164- PT Re-evaluation, 97110-Therapeutic exercises, 97530- Therapeutic activity, 97112- Neuromuscular re-education, 97535- Self Care, 16109- Manual therapy, Patient/Family  education, Balance training, Stair training, Taping, Dry Needling, Joint mobilization, Spinal mobilization, Cryotherapy, and Moist heat  PLAN FOR NEXT SESSION: Review/update HEP PRN. Work on [employer] exercises as appropriate with emphasis on postural strength, UE strength, and GH mobility. Symptom modification strategies as indicated/appropriate.    Ashley Murrain PT, DPT 07/12/2023 12:42 PM

## 2023-07-12 ENCOUNTER — Ambulatory Visit: Payer: Medicaid Other | Attending: Family Medicine | Admitting: Physical Therapy

## 2023-07-12 ENCOUNTER — Encounter: Payer: Self-pay | Admitting: Physical Therapy

## 2023-07-12 DIAGNOSIS — M25511 Pain in right shoulder: Secondary | ICD-10-CM | POA: Diagnosis not present

## 2023-07-12 DIAGNOSIS — M25611 Stiffness of right shoulder, not elsewhere classified: Secondary | ICD-10-CM | POA: Diagnosis not present

## 2023-07-12 DIAGNOSIS — M2559 Pain in other specified joint: Secondary | ICD-10-CM | POA: Diagnosis not present

## 2023-07-12 DIAGNOSIS — M6281 Muscle weakness (generalized): Secondary | ICD-10-CM | POA: Insufficient documentation

## 2023-07-12 DIAGNOSIS — G8929 Other chronic pain: Secondary | ICD-10-CM | POA: Diagnosis not present

## 2023-07-16 NOTE — Progress Notes (Unsigned)
Cardiology Office Note    Date:  07/18/2023  ID:  Kimberly Zhang, Kimberly Zhang 17-Jul-1966, MRN [id number] PCP:  Moshe Cipro, FNP  Cardiologist:  Chrystie Nose, MD  Electrophysiologist:  None   Chief Complaint: Follow up dyslipidemia   History of Present Illness: .    Kimberly Zhang is a 57 y.o. female with visit-pertinent history of stroke in May, 2023,  statin intolrerance, and dyslipidemia.   First evaluated by Dr. Rennis Golden on 03/04/2022 at the request of her PCP for management of dyslipidemia.  She has a strong family history of high cholesterol in her mom and her sister who also had coronary artery disease, her son also has high cholesterol.  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  Her last lipid profile on 12/08/2022 indicated total cholesterol 199, HDL 48, LDL 121 and triglycerides 409.  Today she presents for follow-up.  She reports that she is doing well. She did not sleep well last night as her dog was sick.  She notes that she has been off of her Repatha since September, she thought that it had not been approved by her insurance.  Reviewed her chart, her prior authorization was approved in September.  She now plans to restart.  She has remained stable from a cardiac perspective.  She notes she still has some baseline tremors from her stroke in May 2023.  She has completed working with occupational and speech therapy.  ROS: .   Today she denies chest pain, shortness of breath, lower extremity edema, fatigue, palpitations, melena, hematuria, hemoptysis, diaphoresis, weakness, presyncope, syncope, orthopnea, and PND.  All other systems are reviewed and otherwise negative. Studies Reviewed: Marland Kitchen    EKG:  EKG is ordered today, personally reviewed, demonstrating  EKG Interpretation Date/Time:  Tuesday July 18 2023 09:36:00 EST Ventricular Rate:  59 PR Interval:  170 QRS  Duration:  70 QT Interval:  414 QTC Calculation: 409 R Axis:   15  Text Interpretation: Sinus bradycardia Nonspecific T wave abnormality No significant change compared to prior Confirmed by Reather Littler [phone]) on 07/18/2023 9:42:05 AM   CV Studies:  Cardiac Studies & Procedures      ECHOCARDIOGRAM  ECHOCARDIOGRAM COMPLETE 12/11/2021  Narrative ECHOCARDIOGRAM REPORT    Patient Name:   Kimberly Zhang Date of Exam: 12/11/2021 Medical Rec #:  [id number]         Height:       62.8 in Accession #:    [id number]        Weight:       175.9 lb Date of Birth:  [birth year]        BSA:          1.826 m Patient Age:    55 years          BP:           132/81 mmHg Patient Gender: F                 HR:           61 bpm. Exam Location:  Inpatient  Procedure: 2D Echo  Indications:    TIA  History:        Patient has no prior history of Echocardiogram examinations. Risk Factors:Dyslipidemia.  Sonographer:    Delcie Roch RDCS Referring Phys: [id number] Azucena Fallen   Sonographer Comments: Image acquisition challenging due to patient body habitus.  IMPRESSIONS   1. Left ventricular ejection fraction, by estimation, is 60 to 65%. The left ventricle has normal function. The left ventricle has no regional wall motion abnormalities. Left ventricular diastolic parameters were normal. 2. Right ventricular systolic function is normal. The right ventricular size is normal. 3. Mild mitral valve regurgitation. 4. The aortic valve is normal in structure. Aortic valve regurgitation is not visualized. 5. The inferior vena cava is normal in size with greater than 50% respiratory variability, suggesting right atrial pressure of 3 mmHg.  FINDINGS Left Ventricle: Left ventricular ejection fraction, by estimation, is 60 to 65%. The left ventricle has normal function. The left ventricle has no regional wall motion abnormalities. The left ventricular internal cavity size was normal in size. There  is borderline left ventricular hypertrophy. Left ventricular diastolic parameters were normal.  Right Ventricle: The right ventricular size is normal. Right vetricular wall thickness was not assessed. Right ventricular systolic function is normal.  Left Atrium: Left atrial size was normal in size.  Right Atrium: Right atrial size was normal in size.  Pericardium: There is no evidence of pericardial effusion.  Mitral Valve: There is mild thickening of the mitral valve leaflet(s). There is mild calcification of the mitral valve leaflet(s). Mild mitral valve regurgitation.  Tricuspid Valve: The tricuspid valve is normal in structure. Tricuspid valve regurgitation is trivial.  Aortic Valve: The aortic valve is normal in structure. Aortic valve regurgitation is not visualized.  Pulmonic Valve: The pulmonic valve was normal in structure. Pulmonic valve regurgitation is trivial.  Aorta: The aortic root and ascending aorta are structurally normal, with no evidence of dilitation.  Venous: The inferior vena cava is normal in size with greater than 50% respiratory variability, suggesting right atrial pressure of 3 mmHg.  IAS/Shunts: No atrial level shunt detected by color flow Doppler.   LEFT VENTRICLE PLAX 2D LVIDd:         4.20 cm   Diastology LVIDs:         3.00 cm   LV e' medial:    6.74 cm/s LV PW:         1.00 cm   LV E/e' medial:  8.4 LV IVS:        1.10 cm   LV e' lateral:   6.31 cm/s LVOT diam:     1.90 cm   LV E/e' lateral: 9.0 LV SV:         50 LV SV Index:   27 LVOT Area:     2.84 cm   RIGHT VENTRICLE         IVC TAPSE (M-mode): 1.9 cm  IVC diam: 1.50 cm  LEFT ATRIUM             Index        RIGHT ATRIUM          Index LA diam:        2.90 cm 1.59 cm/m   RA Area:     7.89 cm LA Vol (A2C):   35.4 ml 19.39 ml/m  RA Volume:   13.30 ml 7.28 ml/m LA Vol (A4C):   33.2 ml 18.18 ml/m LA Biplane Vol: 34.5 ml 18.89 ml/m AORTIC VALVE LVOT Vmax:   90.70 cm/s LVOT Vmean:   56.200 cm/s LVOT VTI:    0.175 m  AORTA Ao Root diam: 2.70 cm Ao Asc diam:  2.70 cm  MITRAL VALVE MV Area (PHT): 3.17 cm    SHUNTS MV Decel Time: 239 msec  Systemic VTI:  0.18 m MV E velocity: 56.80 cm/s  Systemic Diam: 1.90 cm MV A velocity: 76.40 cm/s MV E/A ratio:  0.74  Dietrich Pates MD Electronically signed by Dietrich Pates MD Signature Date/Time: 12/11/2021/1:28:39 PM    Final             Current Reported Medications:.    Current Meds  Medication Sig   Atogepant (QULIPTA) 30 MG TABS Take 1 tablet (30 mg total) by mouth daily.   escitalopram (LEXAPRO) 10 MG tablet Take 1 tablet (10 mg total) by mouth daily.   levocetirizine (XYZAL) 5 MG tablet TAKE 1 TABLET BY MOUTH DAILY FOR ALLERGIES   Ubrogepant (UBRELVY) 50 MG TABS Take 1 tablet (50 mg total) by mouth as needed.   Vitamin D, Ergocalciferol, (DRISDOL) 1.25 MG (50000 UNIT) CAPS capsule TAKE 1 CAPSULE BY MOUTH 3 DAYS A WEEK (Patient taking differently: Take 50,000 Units by mouth. TAKE 1 CAPSULE BY MOUTH 3 DAYS A WEEK)   [DISCONTINUED] Evolocumab (REPATHA SURECLICK) 140 MG/ML SOAJ Inject 140 mg into the skin every 14 (fourteen) days.   Physical Exam:    VS:  BP 138/76 (BP Location: Left Arm, Patient Position: Sitting, Cuff Size: Normal)   Pulse (!) 59   Ht 5\' 3"  (1.6 m)   Wt 180 lb (81.6 kg)   SpO2 97%   BMI 31.89 kg/m    Wt Readings from Last 3 Encounters:  07/18/23 180 lb (81.6 kg)  06/26/23 178 lb (80.7 kg)  12/08/22 163 lb 12.8 oz (74.3 kg)    GEN: Well nourished, well developed in no acute distress NECK: No JVD; No carotid bruits CARDIAC: RRR, no murmurs, rubs, gallops RESPIRATORY:  Clear to auscultation without rales, wheezing or rhonchi  ABDOMEN: Soft, non-tender, non-distended EXTREMITIES:  No edema; No acute deformity   Asessement and Plan:Marland Kitchen    Hyperlipidemia: Last lipid profile on 12/08/2022 indicated total cholesterol 199, HDL 48, LDL 121 and triglycerides 098.  Patient notes that she has been off  of Repatha since September as she was not aware that her prior authorization had been approved.  Will send in refill of Repatha and she plans to restart.  Will check fasting lipid profile and LFTs in 2 months.  Will have her follow-up with Dr. Rennis Golden in 3 months.  Hx of a stroke: In May 2023, continues to have tremors. Currently working with physical therapy for shoulder pain. Finished working with speech therapy and occupational therapy.   Statin intolerance: Patient unable to tolerate rosuvastatin or atorvastatin for nausea and GI discomfort.     Disposition: F/u with Dr. Rennis Golden in 3-4 months.   Signed, Rip Harbour, NP

## 2023-07-18 ENCOUNTER — Telehealth: Payer: Self-pay | Admitting: Pharmacy Technician

## 2023-07-18 ENCOUNTER — Ambulatory Visit: Payer: Medicaid Other | Attending: Cardiology | Admitting: Cardiology

## 2023-07-18 ENCOUNTER — Encounter: Payer: Self-pay | Admitting: Cardiology

## 2023-07-18 ENCOUNTER — Other Ambulatory Visit (HOSPITAL_COMMUNITY): Payer: Self-pay

## 2023-07-18 DIAGNOSIS — Z789 Other specified health status: Secondary | ICD-10-CM

## 2023-07-18 DIAGNOSIS — Z8673 Personal history of transient ischemic attack (TIA), and cerebral infarction without residual deficits: Secondary | ICD-10-CM | POA: Diagnosis not present

## 2023-07-18 DIAGNOSIS — R001 Bradycardia, unspecified: Secondary | ICD-10-CM

## 2023-07-18 DIAGNOSIS — E7801 Familial hypercholesterolemia: Secondary | ICD-10-CM | POA: Diagnosis not present

## 2023-07-18 MED ORDER — REPATHA SURECLICK 140 MG/ML ~~LOC~~ SOAJ: 140.0000 mg | SUBCUTANEOUS | 0 refills | Status: DC

## 2023-07-18 NOTE — Telephone Encounter (Signed)
Pharmacy Patient Advocate Encounter   Received notification from CoverMyMeds that prior authorization for repatha is required/requested.   Insurance verification completed.   The patient is insured through [facility] .   Per test claim: PA required; PA submitted to above mentioned insurance via CoverMyMeds Key/confirmation #/EOC [id number] Status is pending

## 2023-07-18 NOTE — Patient Instructions (Signed)
Medication Instructions:  No changes *If you need a refill on your cardiac medications before your next appointment, please call your pharmacy*   Lab Work: In 2-3 month prior to appointment with Dr Rennis Golden we would like for you to have Fasting Lipid and LFT drawn If you have labs (blood work) drawn today and your tests are completely normal, you will receive your results only by: MyChart Message (if you have MyChart) OR A paper copy in the mail If you have any lab test that is abnormal or we need to change your treatment, we will call you to review the results.   Testing/Procedures: No testing  Follow-Up: At [facility], you and your health needs are our priority.  As part of our continuing mission to provide you with exceptional heart care, we have created designated Provider Care Teams.  These Care Teams include your primary Cardiologist (physician) and Advanced Practice Providers (APPs -  Physician Assistants and Nurse Practitioners) who all work together to provide you with the care you need, when you need it.  We recommend signing up for the patient portal called "MyChart".  Sign up information is provided on this After Visit Summary.  MyChart is used to connect with patients for Virtual Visits (Telemedicine).  Patients are able to view lab/test results, encounter notes, upcoming appointments, etc.  Non-urgent messages can be sent to your provider as well.   To learn more about what you can do with MyChart, go to [email or web address].    Your next appointment:   3 month(s)  Provider:   Chrystie Nose, MD

## 2023-07-20 NOTE — Telephone Encounter (Signed)
Pharmacy Patient Advocate Encounter  Received notification from [facility] that Prior Authorization for repatha has been APPROVED from 07/18/23 to 09/18/23   PA #/Case ID/Reference #: [id number]

## 2023-07-23 ENCOUNTER — Other Ambulatory Visit: Payer: Self-pay | Admitting: Family Medicine

## 2023-07-23 DIAGNOSIS — F419 Anxiety disorder, unspecified: Secondary | ICD-10-CM

## 2023-07-24 ENCOUNTER — Encounter: Payer: Self-pay | Admitting: Family Medicine

## 2023-07-24 ENCOUNTER — Ambulatory Visit (INDEPENDENT_AMBULATORY_CARE_PROVIDER_SITE_OTHER): Payer: Medicaid Other | Admitting: Family Medicine

## 2023-07-24 VITALS — BP 104/78 | HR 71 | Temp 98.0°F | Ht 63.0 in | Wt 180.4 lb

## 2023-07-24 DIAGNOSIS — G43809 Other migraine, not intractable, without status migrainosus: Secondary | ICD-10-CM | POA: Diagnosis not present

## 2023-07-24 DIAGNOSIS — E538 Deficiency of other specified B group vitamins: Secondary | ICD-10-CM

## 2023-07-24 DIAGNOSIS — Z23 Encounter for immunization: Secondary | ICD-10-CM

## 2023-07-24 DIAGNOSIS — E559 Vitamin D deficiency, unspecified: Secondary | ICD-10-CM

## 2023-07-24 DIAGNOSIS — F419 Anxiety disorder, unspecified: Secondary | ICD-10-CM | POA: Diagnosis not present

## 2023-07-24 DIAGNOSIS — R61 Generalized hyperhidrosis: Secondary | ICD-10-CM | POA: Diagnosis not present

## 2023-07-24 DIAGNOSIS — E66811 Obesity, class 1: Secondary | ICD-10-CM | POA: Diagnosis not present

## 2023-07-24 DIAGNOSIS — E785 Hyperlipidemia, unspecified: Secondary | ICD-10-CM

## 2023-07-24 DIAGNOSIS — G43701 Chronic migraine without aura, not intractable, with status migrainosus: Secondary | ICD-10-CM

## 2023-07-24 DIAGNOSIS — Z0000 Encounter for general adult medical examination without abnormal findings: Secondary | ICD-10-CM | POA: Diagnosis not present

## 2023-07-24 LAB — COMPREHENSIVE METABOLIC PANEL
ALT: 17 U/L (ref 0–35)
AST: 16 U/L (ref 0–37)
Albumin: 4.2 g/dL (ref 3.5–5.2)
Alkaline Phosphatase: 103 U/L (ref 39–117)
BUN: 9 mg/dL (ref 6–23)
CO2: 30 meq/L (ref 19–32)
Calcium: 9.3 mg/dL (ref 8.4–10.5)
Chloride: 103 meq/L (ref 96–112)
Creatinine, Ser: 0.66 mg/dL (ref 0.40–1.20)
GFR: 96.92 mL/min (ref >60.00)
Glucose, Bld: 96 mg/dL (ref 70–99)
Potassium: 4.4 meq/L (ref 3.5–5.1)
Sodium: 141 meq/L (ref 135–145)
Total Bilirubin: 0.4 mg/dL (ref 0.2–1.2)
Total Protein: 6.7 g/dL (ref 6.0–8.3)

## 2023-07-24 LAB — CBC WITH DIFFERENTIAL/PLATELET
Basophils Absolute: 0.1 10*3/uL (ref 0.0–0.1)
Basophils Relative: 0.8 % (ref 0.0–3.0)
Eosinophils Absolute: 0.2 10*3/uL (ref 0.0–0.7)
Eosinophils Relative: 2.4 % (ref 0.0–5.0)
HCT: 43.2 % (ref 36.0–46.0)
Hemoglobin: 14.5 g/dL (ref 12.0–15.0)
Lymphocytes Relative: 46.9 % — ABNORMAL HIGH (ref 12.0–46.0)
Lymphs Abs: 3.5 10*3/uL (ref 0.7–4.0)
MCHC: 33.4 g/dL (ref 30.0–36.0)
MCV: 87.4 fL (ref 78.0–100.0)
Monocytes Absolute: 0.6 10*3/uL (ref 0.1–1.0)
Monocytes Relative: 8.1 % (ref 3.0–12.0)
Neutro Abs: 3.1 10*3/uL (ref 1.4–7.7)
Neutrophils Relative %: 41.8 % — ABNORMAL LOW (ref 43.0–77.0)
Platelets: 278 10*3/uL (ref 150.0–400.0)
RBC: 4.95 Mil/uL (ref 3.87–5.11)
RDW: 13.3 % (ref 11.5–15.5)
WBC: 7.5 10*3/uL (ref 4.0–10.5)

## 2023-07-24 LAB — LIPID PANEL
Cholesterol: 365 mg/dL — ABNORMAL HIGH (ref 0–200)
HDL: 41.1 mg/dL (ref >39.00)
LDL Cholesterol: 287 mg/dL — ABNORMAL HIGH (ref 0–99)
NonHDL: 323.56
Total CHOL/HDL Ratio: 9
Triglycerides: 184 mg/dL — ABNORMAL HIGH (ref 0.0–149.0)
VLDL: 36.8 mg/dL (ref 0.0–40.0)

## 2023-07-24 LAB — VITAMIN B12: Vitamin B-12: 226 pg/mL (ref 211–911)

## 2023-07-24 LAB — VITAMIN D 25 HYDROXY (VIT D DEFICIENCY, FRACTURES): VITD: 21.21 ng/mL — ABNORMAL LOW (ref 30.00–100.00)

## 2023-07-24 LAB — TSH: TSH: 0.52 u[IU]/mL (ref 0.35–5.50)

## 2023-07-24 MED ORDER — METHYLPREDNISOLONE ACETATE 80 MG/ML IJ SUSP
80.0000 mg | Freq: Once | INTRAMUSCULAR | Status: AC
  Administered 2023-07-24: 80 mg via INTRAMUSCULAR

## 2023-07-24 MED ORDER — UBRELVY 50 MG PO TABS: 1.0000 | ORAL_TABLET | ORAL | 1 refills | Status: DC | PRN

## 2023-07-24 MED ORDER — ESCITALOPRAM OXALATE 10 MG PO TABS: 10.0000 mg | ORAL_TABLET | Freq: Every day | ORAL | 1 refills | Status: DC

## 2023-07-24 MED ORDER — KETOROLAC TROMETHAMINE 60 MG/2ML IM SOLN
60.0000 mg | Freq: Once | INTRAMUSCULAR | Status: AC
  Administered 2023-07-24: 60 mg via INTRAMUSCULAR

## 2023-07-24 NOTE — Patient Instructions (Addendum)
We have completed your physical today.  Please get Shingrix and Tdap vaccines from your pharmacy.  I have sent a referral to our pharmacist to reach out to you for exploration of patient assistance in getting your maintenance migraine medications.  You have received a steroid injection in the office today.  You have received an injection of toradol in the office today to help with pain. Do NOT take anything with ibuprofen, naproxen, aspirin or other NSAIDS for 6 hours after your injection today.  I recommend that you also take a benadryl when you get home to complete your migraine cocktail.   We are checking labs today, will be in contact with any results that require further attention  Follow up with me in 6 months for medication management, sooner if needed.

## 2023-07-24 NOTE — Progress Notes (Signed)
Complete physical exam  Patient: Kimberly Zhang   DOB: 1966/02/28   57 y.o. Female  MRN: [id number]  Subjective:    Chief Complaint  Patient presents with   Annual Exam    Annual Exam. Fasting     Kimberly Zhang is a 57 y.o. female who presents today for a complete physical exam. She reports consuming a general diet. Exercise is limited by neurologic condition(s): migraines. She generally feels poorly. She reports sleeping fairly well. She does have additional problems to discuss today.   Recurrent migraine, has been unable to get her medications for prevention and maintenance, abortive therapy.  Has not taken NSAIDS in the last 6 hours.   Also reports unprovoked sweating from her head for the last few years intermittently.    Most recent fall risk assessment:    06/26/2023    1:07 PM  Fall Risk   Falls in the past year? 1  Number falls in past yr: 0  Injury with Fall? 0  Risk for fall due to : History of fall(s)  Follow up Falls evaluation completed     Most recent depression screenings:    07/24/2023    9:17 AM 06/26/2023    1:07 PM  PHQ 2/9 Scores  PHQ - 2 Score 4 0  PHQ- 9 Score 22     Vision:Within last year and Dental: No current dental problems and Receives regular dental care  Patient Active Problem List   Diagnosis Date Noted   Transient ischemic attack (TIA) 12/10/2021   B12 deficiency 04/21/2021   Obesity (BMI 30.0-34.9) 12/01/2015   Female genuine stress incontinence 10/20/2014   Benign hematuria    Hyperlipidemia    Anxiety    Asthma    Migraine    Vitamin D deficiency    Family History  Problem Relation Age of Onset   Liver disease Father    Hyperlipidemia Mother    Migraines Mother    Diabetes Paternal Grandmother    Heart disease Maternal Aunt    Cancer Maternal Aunt    Cancer Maternal Uncle    Depression Other    Alzheimer's disease Other    Healthy Sister    Healthy Son    Healthy Daughter    Colon cancer Neg Hx     Esophageal cancer Neg Hx    Rectal cancer Neg Hx    Stomach cancer Neg Hx    Colon polyps Neg Hx    Allergies  Allergen Reactions   Shellfish Allergy Anaphylaxis   Shellfish-Derived Products Anaphylaxis   Atorvastatin     Severe nausea   Rosuvastatin     Nausea, GI      Patient Care Team: Moshe Cipro, FNP as PCP - General (Family Medicine) Rennis Golden, Lisette Abu, MD as PCP - Cardiology (Cardiology) Ihor Gully, MD (Inactive) as Consulting Physician (Urology)   Outpatient Medications Prior to Visit  Medication Sig   Atogepant (QULIPTA) 30 MG TABS Take 1 tablet (30 mg total) by mouth daily.   Evolocumab (REPATHA SURECLICK) 140 MG/ML SOAJ Inject 140 mg into the skin every 14 (fourteen) days.   levocetirizine (XYZAL) 5 MG tablet TAKE 1 TABLET BY MOUTH DAILY FOR ALLERGIES   Vitamin D, Ergocalciferol, (DRISDOL) 1.25 MG (50000 UNIT) CAPS capsule TAKE 1 CAPSULE BY MOUTH 3 DAYS A WEEK (Patient taking differently: Take 50,000 Units by mouth. TAKE 1 CAPSULE BY MOUTH 3 DAYS A WEEK)   [DISCONTINUED] escitalopram (LEXAPRO) 10 MG tablet Take 1 tablet (10 mg  total) by mouth daily.   [DISCONTINUED] Ubrogepant (UBRELVY) 50 MG TABS Take 1 tablet (50 mg total) by mouth as needed.   No facility-administered medications prior to visit.    ROS  Per HPI      Objective:     BP 104/78   Pulse 71   Temp 98 F (36.7 C)   Ht 5\' 3"  (1.6 m)   Wt 180 lb 6.4 oz (81.8 kg)   SpO2 98%   BMI 31.96 kg/m    Physical Exam Vitals and nursing note reviewed.  Constitutional:      General: She is not in acute distress.    Comments: Appears fatigued, uncomfortable  HENT:     Head: Normocephalic and atraumatic.     Right Ear: Tympanic membrane, ear canal and external ear normal.     Left Ear: Tympanic membrane, ear canal and external ear normal.     Nose: Nose normal. No congestion.     Mouth/Throat:     Mouth: Mucous membranes are moist.     Pharynx: Oropharynx is clear. No oropharyngeal  exudate or posterior oropharyngeal erythema.  Eyes:     Extraocular Movements: Extraocular movements intact.     Conjunctiva/sclera: Conjunctivae normal.     Pupils: Pupils are equal, round, and reactive to light.  Neck:     Thyroid: No thyromegaly.  Cardiovascular:     Rate and Rhythm: Normal rate and regular rhythm.     Pulses: Normal pulses.     Heart sounds: Normal heart sounds. No murmur heard. Pulmonary:     Effort: Pulmonary effort is normal. No respiratory distress.     Breath sounds: Normal breath sounds. No wheezing.  Abdominal:     General: Abdomen is flat. Bowel sounds are normal.     Palpations: Abdomen is soft.  Genitourinary:    Comments: To GYN Musculoskeletal:        General: Normal range of motion.     Cervical back: Normal range of motion and neck supple.  Lymphadenopathy:     Cervical: No cervical adenopathy.  Skin:    General: Skin is warm and dry.     Capillary Refill: Capillary refill takes less than 2 seconds.  Neurological:     General: No focal deficit present.     Mental Status: She is alert and oriented to person, place, and time. Mental status is at baseline.  Psychiatric:        Mood and Affect: Mood normal.        Behavior: Behavior normal.        Thought Content: Thought content normal.      No results found for any visits on 07/24/23. Last CBC Lab Results  Component Value Date   WBC 7.6 12/08/2022   HGB 14.6 12/08/2022   HCT 44.1 12/08/2022   MCV 87.2 12/08/2022   MCH 28.9 12/08/2022   RDW 12.4 12/08/2022   PLT 307 12/08/2022   Last metabolic panel Lab Results  Component Value Date   GLUCOSE 77 12/08/2022   NA 142 12/08/2022   K 4.3 12/08/2022   CL 107 12/08/2022   CO2 27 12/08/2022   BUN 6 (L) 12/08/2022   CREATININE 0.67 12/08/2022   EGFR 102 12/08/2022   CALCIUM 9.4 12/08/2022   PROT 6.3 12/08/2022   ALBUMIN 3.7 12/10/2021   BILITOT 0.5 12/08/2022   ALKPHOS 103 12/10/2021   AST 12 12/08/2022   ALT 13 12/08/2022    ANIONGAP 7 12/10/2021   Last  lipids Lab Results  Component Value Date   CHOL 199 12/08/2022   HDL 48 (L) 12/08/2022   LDLCALC 121 (H) 12/08/2022   TRIG 187 (H) 12/08/2022   CHOLHDL 4.1 12/08/2022   Last hemoglobin A1c Lab Results  Component Value Date   HGBA1C 5.6 06/03/2022   Last thyroid functions Lab Results  Component Value Date   TSH 0.57 06/03/2022   Last vitamin D Lab Results  Component Value Date   VD25OH 39 06/03/2022   Last vitamin B12 and Folate Lab Results  Component Value Date   VITAMINB12 268 05/04/2022         Assessment & Plan:    Routine Health Maintenance and Physical Exam   Health Maintenance  Topic Date Due   Hepatitis C Screening  Never done   Mammogram  07/16/2023   DTaP/Tdap/Td vaccine (3 - Td or Tdap) 09/22/2023*   Zoster (Shingles) Vaccine (1 of 2) 09/22/2023*   COVID-19 Vaccine (5 - 2024-25 season) 08/07/2023   Colon Cancer Screening  01/23/2025   Pap with HPV screening  06/04/2027   Flu Shot  Completed   HIV Screening  Completed   HPV Vaccine  Aged Out  *Topic was postponed. The date shown is not the original due date.     Discussed health benefits of physical activity, and encouraged her to engage in regular exercise appropriate for her age and condition.  Hyperlipidemia, unspecified hyperlipidemia type -     Comprehensive metabolic panel -     Lipid panel -     CBC with Differential/Platelet -     AMB Referral [facility] Care Management  Anxiety tension state -     Escitalopram Oxalate; Take 1 tablet (10 mg total) by mouth daily.  Dispense: 90 tablet; Refill: 1 -     AMB Referral [facility]BCI Care Management  Other migraine without status migrainosus, not intractable -     Bernita Raisin; Take 1 tablet (50 mg total) by mouth as needed.  Dispense: 90 tablet; Refill: 1 -     AMB Referral [facility]BCI Care Management -     TSH -     Ketorolac Tromethamine -     methylPREDNISolone Acetate  Chronic migraine without aura with status migrainosus, not  intractable Bernita Raisin; Take 1 tablet (50 mg total) by mouth as needed.  Dispense: 90 tablet; Refill: 1 -     AMB Referral [facility]BCI Care Management  Hyperhidrosis -     TSH  Vitamin D deficiency -     VITAMIN D 25 Hydroxy (Vit-D Deficiency, Fractures)  Obesity (BMI 30.0-34.9)  B12 deficiency -     Vitamin B12  Need for vaccination  Discussed that she is due for Shingrix and tetanus vaccines, VIS sheets attached, will look into getting these at her pharmacy.  Return in about 6 months (around 01/22/2024) for med mgt.     Moshe Cipro, FNP

## 2023-07-25 ENCOUNTER — Telehealth: Payer: Self-pay | Admitting: Pharmacist

## 2023-07-25 NOTE — Telephone Encounter (Signed)
 Called patient to schedule pharmacy referral visit that was placed by PCP. Left voicemail with call back number.  Darrelyn Drum, PharmD, BCPS, CPP Clinical Pharmacist Practitioner [facility] [phone]

## 2023-07-31 DIAGNOSIS — R531 Weakness: Secondary | ICD-10-CM | POA: Diagnosis not present

## 2023-08-01 ENCOUNTER — Telehealth: Payer: Self-pay

## 2023-08-01 ENCOUNTER — Ambulatory Visit: Payer: Medicaid Other | Attending: Family Medicine | Admitting: Physical Therapy

## 2023-08-01 ENCOUNTER — Encounter: Payer: Self-pay | Admitting: Physical Therapy

## 2023-08-01 DIAGNOSIS — G8929 Other chronic pain: Secondary | ICD-10-CM | POA: Diagnosis not present

## 2023-08-01 DIAGNOSIS — M6281 Muscle weakness (generalized): Secondary | ICD-10-CM | POA: Diagnosis not present

## 2023-08-01 DIAGNOSIS — M25611 Stiffness of right shoulder, not elsewhere classified: Secondary | ICD-10-CM | POA: Diagnosis not present

## 2023-08-01 DIAGNOSIS — M25511 Pain in right shoulder: Secondary | ICD-10-CM | POA: Diagnosis not present

## 2023-08-01 NOTE — Progress Notes (Signed)
 Care Guide Pharmacy Note  08/01/2023 Name: Kimberly Zhang MRN: [id number] DOB: 08-27-1965  Referred By: Alvia Krabbe, FNP Reason for referral: Care Coordination (Outreach to schedule with Pharm d )   Kimberly Zhang is a 58 y.o. year old female who is a primary care patient of Alvia Krabbe, FNP.  Kimberly Zhang was referred to the pharmacist for assistance related to:  migraine  Successful contact was made with the patient to discuss pharmacy services including being ready for the pharmacist to call at least 5 minutes before the scheduled appointment time and to have medication bottles and any blood pressure readings ready for review. The patient agreed to meet with the pharmacist via telephone visit on (date/time).08/09/2023  Jeoffrey Buffalo , RMA     [place]  [facility], [facility] Guide  Direct Dial: [phone]  Website: [email or web address]

## 2023-08-01 NOTE — Therapy (Signed)
 OUTPATIENT PHYSICAL THERAPY SHOULDER EVALUATION   Patient Name: Kimberly Zhang MRN: [id number] DOB:1966-06-13, 58 y.o., female Today's Date: 08/01/2023  END OF SESSION:  PT End of Session - 08/01/23 1008     Visit Number 2    Number of Visits 17    Date for PT Re-Evaluation 09/06/23    Authorization Type [employer]    PT Start Time 0927    PT Stop Time 1006    PT Time Calculation (min) 39 min    Activity Tolerance Patient tolerated treatment well    Behavior During Therapy [facility] for tasks assessed/performed              Past Medical History:  Diagnosis Date   Allergy    Anxiety    Asthma    Benign hematuria 2011   [place]   Hyperlipidemia    Migraine    Vitamin D  deficiency    Past Surgical History:  Procedure Laterality Date   ABDOMINAL HYSTERECTOMY  1998   COLONOSCOPY  2018   LEEP     POLYPECTOMY     TONSILECTOMY/ADENOIDECTOMY WITH MYRINGOTOMY     Patient Active Problem List   Diagnosis Date Noted   Transient ischemic attack (TIA) 12/10/2021   B12 deficiency 04/21/2021   Obesity (BMI 30.0-34.9) 12/01/2015   Female genuine stress incontinence 10/20/2014   Benign hematuria    Hyperlipidemia    Anxiety    Asthma    Migraine    Vitamin D  deficiency     PCP: Alvia Krabbe, FNP  REFERRING PROVIDER: Alvia Krabbe, FNP  REFERRING DIAG: M25.59 (ICD-10-CM) - Pain in other joint M25.511,G89.29 (ICD-10-CM) - Chronic right shoulder pain  THERAPY DIAG:  Chronic right shoulder pain  Muscle weakness (generalized)  Stiffness of right shoulder, not elsewhere classified  Rationale for Evaluation and Treatment: Rehabilitation  ONSET DATE: 2023  SUBJECTIVE:                                                                                                                                                                                      SUBJECTIVE STATEMENT: Pt reported feeling good with the exercises and making good progress. Demonstrated  improved mobility from last session. Pt gave 2 identifiers: name and DOB as this is the first encounter with treating therapist. Hand dominance: Right  PERTINENT HISTORY: anxiety, asthma, migraines Reports stroke 2023 with residual deficits  PAIN:  Are you having pain: 3/10 Location/description: R shoulder/neck, tightness worst over past week: 10/10, fades slowly   - aggravating factors: manipulating objects (pots/pans), lifting arm, lying on R side, avoiding carrying - Easing factors: rest     PRECAUTIONS: R sided weakness/tremor  WEIGHT BEARING RESTRICTIONS: No  FALLS:  Has patient fallen in last 6 months? Yes. Number of falls 2 falls - reports balance issues since stroke. 1 fall coming down steps, 1 fall in family room where she felt dizzy   LIVING ENVIRONMENT: 2 story home, requires stair navigation. Does better with ascending than descending. No STE. Has rail inside. 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  DIAGNOSTIC FINDINGS:  No recent shoulder imaging in chart  PATIENT SURVEYS:  FOTO 55 > 64  COGNITION: Overall cognitive status: Within functional limits for tasks assessed     SENSATION: Light touch intact/symmetrical BUE although pt does endorse tingling/tremor in RUE   POSTURE: Forward head, rounded shoulders  UPPER EXTREMITY ROM:  A/PROM Right eval Left eval  Shoulder flexion 98 deg * 162 deg  Shoulder abduction 82 deg * 150 deg  Shoulder internal rotation    Shoulder external rotation (functional) (About parallel with ear) T3  Elbow flexion    Elbow extension    Wrist flexion    Wrist extension     (Blank rows = not tested) (Key: WFL = within functional limits not formally  assessed, * = concordant pain, s = stiffness/stretching sensation, NT = not tested)  Comments:    UPPER EXTREMITY MMT:  MMT Right eval Left eval  Shoulder flexion  4+  Shoulder extension  4  Shoulder abduction    Shoulder extension    Shoulder internal rotation 3+ painless 4  Shoulder external rotation 3+ painless  4  Elbow flexion    Elbow extension    Grip strength 15# 50#  (Blank rows = not tested)  (Key: WFL = within functional limits not formally assessed, * = concordant pain, s = stiffness/stretching sensation, NT = not tested)  Comments:    PALPATION:  Quite tender throughout R UT, LS, rhomboid, mid trap, infraspinatus, teres, deltoid. Does report some relief with manual pressure as well   TODAY'S TREATMENT:    [facility] Adult PT Treatment:                                                DATE: 08/01/2023  Therapeutic Exercise: UBE 1' fwd/bkwd RUE ranger 3x30s Flexion/Scaption Banded Scapular retractions 2x8, green  Neuromuscular re-ed: Banded PNF D1 flexion 1x8, yellow with manual follow Banded PND D2 extension 1x8, Yellow with maual follow  [facility] Adult PT Treatment:                                                DATE: 07/12/23 Therapeutic Exercise: Scapular retractions practice reps cues for form, comfortable ROM Towel gripping practice reps cues for pacing HEP handout + education    PATIENT EDUCATION: Education details: Pt education on PT impairments, prognosis, and POC. Informed consent. Rationale for interventions, safe/appropriate HEP performance Person educated: Patient Education method: Explanation, Demonstration, Tactile cues, Verbal cues, and Handouts Education comprehension: verbalized understanding, returned demonstration, verbal cues required, tactile cues required, and needs further education    HOME EXERCISE  PROGRAM: Access Code: [email or web address] URL: [email or web address] Date: 07/12/2023 Prepared by: Alm Jenny  Exercises - Seated Scapular Retraction  - 2-3 x daily - 1 sets - 8-10 reps - Seated Gripping Towel  - 2-3 x daily - 1 sets - 8-10 reps  ASSESSMENT:  CLINICAL IMPRESSION: Pt attended physical therapy session for continuation of treatment regarding R shoulder pain and mobility limitations. Pt tolerated treatment great and demonstrated improvement with overhead mobility and strength throughout all shoulder ROMs. Some difficulties with scaption and PNF movements, however completed all activities with good quality and appropriate technique. . Pt required minimal vocal/tacticle cues for safe and appropriate performance of today's interventions. Pt took yellow TB home to practice PNF patterns alongside current HEP.   OBJECTIVE IMPAIRMENTS: decreased activity tolerance, decreased endurance, decreased mobility, decreased ROM, decreased strength, impaired perceived functional ability, impaired UE functional use, postural dysfunction, and pain.   ACTIVITY LIMITATIONS: carrying, lifting, sleeping, bathing, dressing, and hygiene/grooming  PARTICIPATION LIMITATIONS: meal prep, cleaning, laundry, driving, community activity, and occupation  PERSONAL FACTORS: Time since onset of injury/illness/exacerbation and 3+ comorbidities: anxiety, asthma, migraines, hx CVA  are also affecting patient's functional outcome.   REHAB POTENTIAL: Good  CLINICAL DECISION MAKING: Stable/uncomplicated  EVALUATION COMPLEXITY: Low   GOALS: Goals reviewed with patient? Yes  SHORT TERM GOALS: Target date: 08/09/2023 Pt will demonstrate appropriate understanding and performance of initially prescribed HEP in order to facilitate improved independence with management of symptoms.  Baseline: HEP provided on eval Goal status: INITIAL   2. Pt will score greater than or equal to 60 on FOTO in order to  demonstrate improved perception of function due to symptoms.  Baseline: 55  Goal status: INITIAL    LONG TERM GOALS: Target date: 09/06/2023 Pt will score 64 on FOTO in order to demonstrate improved perception of function due to symptoms. Baseline: 55 Goal status: INITIAL  2.  Pt will demonstrate at least 140 degrees of active shoulder elevation on RUE in order to demonstrate improved tolerance to functional movement patterns such as reaching overhead. Baseline: see ROM chart above Goal status: INITIAL  3.  Pt will demonstrate at least 4+/5 shoulder ER/IR MMT on RUE for improved symmetry of UE strength and improved tolerance to functional movements.  Baseline: see MMT chart above Goal status: INITIAL  4. Pt will report ability to perform upper body dressing with less than 2 point increase in pain on NPS in order to indicate improved tolerance/independence with ADLs.   Baseline: increased pain, requiring modification  Goal status: INITIAL   5. Pt will report at least 50% decrease in overall pain levels in past week in order to facilitate improved tolerance to basic ADLs/mobility.   Baseline: up to 10/10  Goal  status: INITIAL    6. Pt will demonstrate at least 30# grip strength on RUE in order to facilitate improved functional strength.  Baseline: see MMT chart above  Goal status: INITIAL   PLAN:  PT FREQUENCY: 1-2x/week  PT DURATION: 8 weeks  PLANNED INTERVENTIONS: 97164- PT Re-evaluation, 97110-Therapeutic exercises, 97530- Therapeutic activity, 97112- Neuromuscular re-education, 97535- Self Care, [id number]- Manual therapy, Patient/Family education, Balance training, Stair training, Taping, Dry Needling, Joint mobilization, Spinal mobilization, Cryotherapy, and Moist heat  PLAN FOR NEXT SESSION: Review/update HEP PRN. Work on [employer] exercises as appropriate with emphasis on postural strength, UE strength, and GH mobility. Symptom modification strategies as indicated/appropriate.     Mabel Kiang, PT, DPT 08/01/2023, 10:12 AM

## 2023-08-02 ENCOUNTER — Telehealth: Payer: Self-pay | Admitting: Family Medicine

## 2023-08-02 ENCOUNTER — Telehealth: Payer: Self-pay

## 2023-08-02 NOTE — Telephone Encounter (Signed)
 Copied from CRM [id number]. Topic: Clinical - Medical Advice >> Aug 02, 2023 10:24 AM Corin V wrote:  Reason for CRM: Patient work up with a bloodshot eye and it is very irritated. No appointments were available until Friday . Patient is wanting to know if anything can be called in to provide relief before she is seen. She also thinks she has a cold and the sinus pressure is starting to trigger her migraines. Please call patient back to advise on medication options or possible treatment options until she is seen.

## 2023-08-02 NOTE — Telephone Encounter (Signed)
 Copied from CRM [id number]. Topic: Clinical - Medical Advice >> Aug 02, 2023 10:24 AM Corin V wrote: Reason for CRM: Patient work up with a bloodshot eye and it is very irritated. No appointments were available until Friday . Patient is wanting to know if anything can be called in to provide relief before she is seen. She also thinks she has a cold and the sinus pressure is starting to trigger her migraines. Please call patient back to advise on medication options or possible treatment options until she is seen.  ---  Pt is scheduled on 1.10.25

## 2023-08-02 NOTE — Telephone Encounter (Signed)
 Addressing note in duplicate telephone encounter.

## 2023-08-04 ENCOUNTER — Ambulatory Visit: Payer: Medicaid Other | Admitting: Family Medicine

## 2023-08-07 ENCOUNTER — Ambulatory Visit: Payer: Medicaid Other | Admitting: Family Medicine

## 2023-08-07 ENCOUNTER — Encounter: Payer: Self-pay | Admitting: Family Medicine

## 2023-08-07 VITALS — BP 130/88 | HR 79 | Temp 98.3°F | Ht 63.0 in | Wt 180.0 lb

## 2023-08-07 DIAGNOSIS — H65193 Other acute nonsuppurative otitis media, bilateral: Secondary | ICD-10-CM

## 2023-08-07 DIAGNOSIS — Z8673 Personal history of transient ischemic attack (TIA), and cerebral infarction without residual deficits: Secondary | ICD-10-CM

## 2023-08-07 DIAGNOSIS — R0981 Nasal congestion: Secondary | ICD-10-CM | POA: Diagnosis not present

## 2023-08-07 DIAGNOSIS — R051 Acute cough: Secondary | ICD-10-CM | POA: Diagnosis not present

## 2023-08-07 DIAGNOSIS — E7801 Familial hypercholesterolemia: Secondary | ICD-10-CM | POA: Diagnosis not present

## 2023-08-07 DIAGNOSIS — H1033 Unspecified acute conjunctivitis, bilateral: Secondary | ICD-10-CM

## 2023-08-07 MED ORDER — POLYMYXIN B-TRIMETHOPRIM 10000-0.1 UNIT/ML-% OP SOLN: 1.0000 [drp] | OPHTHALMIC | 0 refills | Status: AC

## 2023-08-07 MED ORDER — REPATHA SURECLICK 140 MG/ML ~~LOC~~ SOAJ: 140.0000 mg | SUBCUTANEOUS | 6 refills | Status: DC

## 2023-08-07 MED ORDER — FLUTICASONE PROPIONATE 50 MCG/ACT NA SUSP: 2.0000 | Freq: Every day | NASAL | 6 refills | Status: DC

## 2023-08-07 MED ORDER — PROMETHAZINE HCL 6.25 MG/5ML PO SOLN: 6.2500 mg | Freq: Four times a day (QID) | ORAL | 0 refills | Status: DC | PRN

## 2023-08-07 MED ORDER — AMOXICILLIN-POT CLAVULANATE 875-125 MG PO TABS: 1.0000 | ORAL_TABLET | Freq: Two times a day (BID) | ORAL | 0 refills | Status: DC

## 2023-08-07 NOTE — Progress Notes (Signed)
 Acute Office Visit  Subjective:     Patient ID: Kimberly Zhang, female    DOB: Sep 25, 1965, 58 y.o.   MRN: [id number]  Chief Complaint  Patient presents with   URI    Productive cough (clear phlegm), sore throat, phlegm, chest pain (believes she had pulled a muscle due to cough). Pain in both eyes now as well as redness. Right ear pain. Patient using mucinex, robutussin, theraflu, nyquil, dayquil. Most symptoms ongoing for the past 2 weeks    HPI Patient is in today for evaluation of cough, R ear pain, nasal congestion, bilateral eye redness and discharge, fatigue, HA, for the last 2 weeks. Has tried flonase , OTC cough and cold with no relief. Reports that she is around her grandchild, but no known sick contacts. Denies abdominal pain, nausea, vomiting, diarrhea, rash, fever, chills, other symptoms.  Medical hx as outlined below.  ROS Per HPI      Objective:    BP 130/88   Pulse 79   Temp 98.3 F (36.8 C)   Ht 5' 3 (1.6 m)   Wt 180 lb (81.6 kg)   SpO2 99%   BMI 31.89 kg/m    Physical Exam Vitals and nursing note reviewed.  Constitutional:      General: She is not in acute distress.    Comments: Appears fatigued  HENT:     Head: Normocephalic and atraumatic.     Right Ear: A middle ear effusion is present. Tympanic membrane is erythematous and bulging.     Left Ear: A middle ear effusion is present. Tympanic membrane is erythematous and bulging.     Nose: Congestion present.     Right Sinus: Maxillary sinus tenderness and frontal sinus tenderness present.     Left Sinus: Maxillary sinus tenderness and frontal sinus tenderness present.     Mouth/Throat:     Mouth: Mucous membranes are moist.     Pharynx: Oropharynx is clear. No oropharyngeal exudate or posterior oropharyngeal erythema.     Comments: Oropharyngeal cobblestoning   Eyes:     General:        Right eye: Discharge present.        Left eye: Discharge present.    Extraocular Movements:  Extraocular movements intact.     Comments: Bilateral sclera erythematous, draining thick yellow fluid  Cardiovascular:     Rate and Rhythm: Normal rate and regular rhythm.     Heart sounds: Normal heart sounds.  Pulmonary:     Effort: Pulmonary effort is normal. No respiratory distress.     Breath sounds: No wheezing, rhonchi or rales.     Comments: Productive cough Musculoskeletal:     Cervical back: Normal range of motion and neck supple.  Lymphadenopathy:     Cervical: Cervical adenopathy present.  Skin:    General: Skin is warm and dry.  Neurological:     Mental Status: She is alert.    No results found for any visits on 08/07/23.      Assessment & Plan:  1. Other non-recurrent acute nonsuppurative otitis media of both ears (Primary)  - amoxicillin -clavulanate (AUGMENTIN ) 875-125 MG tablet; Take 1 tablet by mouth 2 (two) times daily.  Dispense: 20 tablet; Refill: 0  2. Acute bacterial conjunctivitis of both eyes  - trimethoprim -polymyxin b  (POLYTRIM ) ophthalmic solution; Place 1 drop into both eyes every 4 (four) hours for 7 days.  Dispense: 10 mL; Refill: 0 - Given that she is around young grandchild  3. Familial hypercholesterolemia  -  Evolocumab  (REPATHA  SURECLICK) 140 MG/ML SOAJ; Inject 140 mg into the skin every 14 (fourteen) days.  Dispense: 2 mL; Refill: 6  4. History of stroke  - Evolocumab  (REPATHA  SURECLICK) 140 MG/ML SOAJ; Inject 140 mg into the skin every 14 (fourteen) days.  Dispense: 2 mL; Refill: 6  5. Nasal congestion  - fluticasone  (FLONASE ) 50 MCG/ACT nasal spray; Place 2 sprays into both nostrils daily.  Dispense: 16 g; Refill: 6  6. Acute cough  - promethazine  (PHENERGAN ) 6.25 MG/5ML solution; Take 5 mLs (6.25 mg total) by mouth every 6 (six) hours as needed (cough).  Dispense: 120 mL; Refill: 0   Meds ordered this encounter  Medications   Evolocumab  (REPATHA  SURECLICK) 140 MG/ML SOAJ    Sig: Inject 140 mg into the skin every 14 (fourteen)  days.    Dispense:  2 mL    Refill:  6   trimethoprim -polymyxin b  (POLYTRIM ) ophthalmic solution    Sig: Place 1 drop into both eyes every 4 (four) hours for 7 days.    Dispense:  10 mL    Refill:  0   amoxicillin -clavulanate (AUGMENTIN ) 875-125 MG tablet    Sig: Take 1 tablet by mouth 2 (two) times daily.    Dispense:  20 tablet    Refill:  0   fluticasone  (FLONASE ) 50 MCG/ACT nasal spray    Sig: Place 2 sprays into both nostrils daily.    Dispense:  16 g    Refill:  6   promethazine  (PHENERGAN ) 6.25 MG/5ML solution    Sig: Take 5 mLs (6.25 mg total) by mouth every 6 (six) hours as needed (cough).    Dispense:  120 mL    Refill:  0    Return if symptoms worsen or fail to improve.  Corean Ku, FNP

## 2023-08-07 NOTE — Patient Instructions (Signed)
 I have sent in Augmentin  for you to take twice a day for 10 days.  This medication can upset your stomach, so I tell everyone to take it with a meal.  I have sent in polytrim  drops for you to  use one drop in each eye every 4 hours for the next 7 days.  I have sent in cough syrup for you to take 5 mL once daily in the evening as needed for cough.  This medication may make you sleepy.  Do not drive or operate heavy machinery while taking this medication.  Refilled fonase, repatha   Follow-up with me for new or worsening symptoms.

## 2023-08-08 ENCOUNTER — Ambulatory Visit: Payer: Medicaid Other | Admitting: Physical Therapy

## 2023-08-08 ENCOUNTER — Encounter: Payer: Self-pay | Admitting: Family Medicine

## 2023-08-09 ENCOUNTER — Other Ambulatory Visit: Payer: Medicaid Other | Admitting: Pharmacist

## 2023-08-09 DIAGNOSIS — G43701 Chronic migraine without aura, not intractable, with status migrainosus: Secondary | ICD-10-CM

## 2023-08-09 NOTE — Progress Notes (Signed)
 08/24/2023 Name: Kimberly Zhang MRN: [id number] DOB: 09/09/1965  Chief Complaint  Patient presents with   Migraine   Medication Access    Kimberly Zhang is a 58 y.o. year old female who presented for a telephone visit.   They were referred to the pharmacist by their PCP for assistance in managing medication access.    Subjective:  Care Team: Primary Care Provider: Casimer Clear, FNP    Medication Access/Adherence  Current Pharmacy:  [id number] - [place] - [address] AT [facility] [place] [address] [place] [address] Phone: [phone] Fax: [phone]   Patient reports affordability concerns with their medications: No  Patient reports access/transportation concerns to their pharmacy: No  Patient reports adherence concerns with their medications:  Yes  Pt has been unable to get Ubrelvy  or Qulipta  due to insurance change to Medicaid.   Migraine without aura: Pt previously on Ubrelvy  PRN and Qulipta  for migraine. These were effective. Since being out of these medications she has had 4 migraines over the past few months. Previously tried: topiramate  and verapamil  for preventoin, sumatriptan  for PRN    Objective:  Lab Results  Component Value Date   HGBA1C 5.6 06/03/2022    Lab Results  Component Value Date   CREATININE 0.66 07/24/2023   BUN 9 07/24/2023   NA 141 07/24/2023   K 4.4 07/24/2023   CL 103 07/24/2023   CO2 30 07/24/2023    Lab Results  Component Value Date   CHOL 365 (H) 07/24/2023   HDL 41.10 07/24/2023   LDLCALC 287 (H) 07/24/2023   TRIG 184.0 (H) 07/24/2023   CHOLHDL 9 07/24/2023    Medications Reviewed Today     Reviewed by Kimberly Zhang, RPH (Pharmacist) on 08/24/23 at [phone]  Med List Status: <None>   Medication Order Taking? Sig Documenting Provider Last Dose Status Informant  amoxicillin -clavulanate (AUGMENTIN ) 875-125 MG tablet [id number]  Take 1 tablet by  mouth 2 (two) times daily. Kimberly Clear, FNP  Active   Atogepant  (QULIPTA ) 30 MG TABS [id number] No Take 1 tablet (30 mg total) by mouth daily.  Patient not taking: Reported on 08/24/2023   Kimberly Clear, FNP Not Taking Active   busPIRone  (BUSPAR ) 5 MG tablet [id number]  TAKE ONE TABLET BY MOUTH THREE TIMES DAILY Wilkinson, Dana E, NP  Active   escitalopram  (LEXAPRO ) 10 MG tablet [id number]  Take 1 tablet (10 mg total) by mouth daily. Kimberly Clear, FNP  Expired 08/23/23 2359   Evolocumab  (REPATHA  SURECLICK) 140 MG/ML SOAJ [id number]  Inject 140 mg into the skin every 14 (fourteen) days. Kimberly Clear, FNP  Active   fluticasone  (FLONASE ) 50 MCG/ACT nasal spray [id number]  Place 2 sprays into both nostrils daily. Kimberly Clear, FNP  Active   levocetirizine (XYZAL ) 5 MG tablet [id number]  TAKE 1 TABLET BY MOUTH DAILY FOR ALLERGIES Wilkinson, Dana E, NP  Active   promethazine  (PHENERGAN ) 6.25 MG/5ML solution [id number]  Take 5 mLs (6.25 mg total) by mouth every 6 (six) hours as needed (cough). Kimberly Clear, FNP  Active   Ubrogepant  (UBRELVY ) 50 MG TABS [id number] No Take 1 tablet (50 mg total) by mouth as needed.  Patient not taking: Reported on 08/24/2023   Kimberly Clear, FNP Not Taking Active   Vitamin D , Ergocalciferol , (DRISDOL ) 1.25 MG (50000 UNIT) CAPS capsule [id number]  TAKE 1 CAPSULE BY MOUTH 3 DAYS A WEEK  Patient taking differently: Take 50,000 Units by mouth. TAKE 1 CAPSULE BY  MOUTH 3 DAYS A WEEK   McClanahan, Kyra, NP  Active Self, Pharmacy Records              Assessment/Plan:   Migraine without aura: Currently uncontrolled - Ubrelvy  PA denied in December - this is on Medicaid's preferred drug list. I believe it was denied due to the PA being filled out incorrectly/medication ordered with incorrect quantity. Called Medicaid and had PA resubmitted - Ubrelvy  now approved  - Qulipta  PA denied because it is not preferred and Medicaid requires  trial of preferred drugs.  - Emgality  Prior Auth Approved  Pt informed of medication approvals  Follow Up Plan: PRN  Kimberly Zhang, PharmD, BCPS, CPP Clinical Pharmacist Practitioner [facility] [phone]

## 2023-08-10 ENCOUNTER — Other Ambulatory Visit: Payer: Self-pay | Admitting: Nurse Practitioner

## 2023-08-24 ENCOUNTER — Ambulatory Visit
Admission: RE | Admit: 2023-08-24 | Discharge: 2023-08-24 | Disposition: A | Discharge status: Home / Self Care | Payer: Medicaid Other | Source: Ambulatory Visit | Attending: Family Medicine | Admitting: Family Medicine

## 2023-08-24 DIAGNOSIS — Z1231 Encounter for screening mammogram for malignant neoplasm of breast: Secondary | ICD-10-CM

## 2023-08-25 ENCOUNTER — Other Ambulatory Visit (HOSPITAL_COMMUNITY): Payer: Self-pay

## 2023-08-27 MED ORDER — EMGALITY 120 MG/ML ~~LOC~~ SOAJ: SUBCUTANEOUS | 0 refills | Status: DC

## 2023-08-28 NOTE — Therapy (Incomplete)
OUTPATIENT PHYSICAL THERAPY SHOULDER EVALUATION   Patient Name: Kimberly Zhang MRN: [id number] DOB:[birth year], 58 y.o., female Today's Date: 08/28/2023  END OF SESSION:     Past Medical History:  Diagnosis Date   Allergy    Anxiety    Asthma    Benign hematuria 2011   [place]   Hyperlipidemia    Migraine    Vitamin D deficiency    Past Surgical History:  Procedure Laterality Date   ABDOMINAL HYSTERECTOMY  1998   COLONOSCOPY  2018   LEEP     POLYPECTOMY     TONSILECTOMY/ADENOIDECTOMY WITH MYRINGOTOMY     Patient Active Problem List   Diagnosis Date Noted   Transient ischemic attack (TIA) 12/10/2021   B12 deficiency 04/21/2021   Obesity (BMI 30.0-34.9) 12/01/2015   Female genuine stress incontinence 10/20/2014   Benign hematuria    Hyperlipidemia    Anxiety    Asthma    Migraine    Vitamin D deficiency     PCP: Moshe Cipro, FNP  REFERRING PROVIDER: Moshe Cipro, FNP  REFERRING DIAG: M25.59 (ICD-10-CM) - Pain in other joint M25.511,G89.29 (ICD-10-CM) - Chronic right shoulder pain  THERAPY DIAG:  No diagnosis found.  Rationale for Evaluation and Treatment: Rehabilitation  ONSET DATE: 2023  SUBJECTIVE:                                                                                                                                                                                      SUBJECTIVE STATEMENT: Pt reported feeling good with the exercises and making good progress. Demonstrated improved mobility from last session. Pt gave 2 identifiers: name and DOB as this is the first encounter with treating therapist. Hand dominance: Right  PERTINENT HISTORY: anxiety, asthma, migraines Reports stroke 2023 with residual deficits  PAIN:  Are you having pain: 3/10 Location/description: R shoulder/neck, tightness worst over past week: 10/10, fades slowly   - aggravating factors: manipulating objects (pots/pans), lifting arm, lying on R side,  avoiding carrying - Easing factors: rest     PRECAUTIONS: R sided weakness/tremor   WEIGHT BEARING RESTRICTIONS: No  FALLS:  Has patient fallen in last 6 months? Yes. Number of falls 2 falls - reports balance issues since stroke. 1 fall coming down steps, 1 fall in family room where she felt dizzy   LIVING ENVIRONMENT: 2 story home, requires stair navigation. Does better with ascending than descending. No STE. Has rail inside. 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  DIAGNOSTIC FINDINGS:  No recent shoulder imaging in chart  PATIENT SURVEYS:  FOTO 59 > 64  COGNITION: Overall cognitive status: Within functional limits for tasks assessed     SENSATION: Light touch intact/symmetrical BUE although pt does endorse tingling/tremor in RUE   POSTURE: Forward head, rounded shoulders  UPPER EXTREMITY ROM:  A/PROM Right eval Left eval  Shoulder flexion 98 deg * 162 deg  Shoulder abduction 82 deg * 150 deg  Shoulder internal rotation    Shoulder external rotation (functional) (About parallel with ear) T3  Elbow flexion    Elbow extension    Wrist flexion    Wrist extension     (Blank rows = not tested) (Key: WFL = within functional limits not formally assessed, * = concordant pain, s = stiffness/stretching sensation, NT = not tested)  Comments:    UPPER EXTREMITY MMT:  MMT Right eval Left eval  Shoulder flexion  4+  Shoulder extension  4  Shoulder abduction    Shoulder extension    Shoulder internal rotation 3+ painless 4  Shoulder external rotation 3+ painless  4  Elbow flexion    Elbow extension    Grip strength 15# 50#  (Blank rows = not tested)  (Key: WFL = within functional limits not  formally assessed, * = concordant pain, s = stiffness/stretching sensation, NT = not tested)  Comments:    PALPATION:  Quite tender throughout R UT, LS, rhomboid, mid trap, infraspinatus, teres, deltoid. Does report some relief with manual pressure as well   [facility] Adult PT Treatment:                                                DATE: 08/28/2023  Therapeutic Exercise: *** Manual Therapy: *** Neuromuscular re-ed: *** Therapeutic Activity: *** Modalities: *** Self Care: [facility] Adult PT Treatment:                                                DATE: 08/01/2023  Therapeutic Exercise: UBE 1' fwd/bkwd RUE ranger 3x30s Flexion/[place]aption Banded Scapular retractions 2x8, green  Neuromuscular re-ed: Banded PNF D1 flexion 1x8, yellow with manual follow Banded PND D2 extension 1x8, Yellow with maual follow                                                                                                                                           PATIENT EDUCATION: Education details: Pt education on PT impairments, prognosis, and POC. Informed consent. Rationale for interventions, safe/appropriate HEP performance Person  educated: Patient Education method: Explanation, Demonstration, Tactile cues, Verbal cues, and Handouts Education comprehension: verbalized understanding, returned demonstration, verbal cues required, tactile cues required, and needs further education    HOME EXERCISE PROGRAM: Access Code: [email or web address] URL: [email or web address] Date: 07/12/2023 Prepared by: Fransisco Hertz  Exercises - Seated Scapular Retraction  - 2-3 x daily - 1 sets - 8-10 reps - Seated Gripping Towel  - 2-3 x daily - 1 sets - 8-10 reps  ASSESSMENT:  CLINICAL IMPRESSION: Pt attended physical therapy session for continuation of treatment regarding ***. Today's treatment focused on improvement of  ***. Pt showed  *** tolerance to treatment and demonstrated improvement with ***. Some  difficulties ***. Pt required *** cuing as well as  *** assistance for safe and appropriate performance of ***. Continue with therapeutic focus on ***.    OBJECTIVE IMPAIRMENTS: decreased activity tolerance, decreased endurance, decreased mobility, decreased ROM, decreased strength, impaired perceived functional ability, impaired UE functional use, postural dysfunction, and pain.   ACTIVITY LIMITATIONS: carrying, lifting, sleeping, bathing, dressing, and hygiene/grooming  PARTICIPATION LIMITATIONS: meal prep, cleaning, laundry, driving, community activity, and occupation  PERSONAL FACTORS: Time since onset of injury/illness/exacerbation and 3+ comorbidities: anxiety, asthma, migraines, hx CVA  are also affecting patient's functional outcome.   REHAB POTENTIAL: Good  CLINICAL DECISION MAKING: Stable/uncomplicated  EVALUATION COMPLEXITY: Low   GOALS: Goals reviewed with patient? Yes  SHORT TERM GOALS: Target date: 08/09/2023 Pt will demonstrate appropriate understanding and performance of initially prescribed HEP in order to facilitate improved independence with management of symptoms.  Baseline: HEP provided on eval Goal status: INITIAL   2. Pt will score greater than or equal to 60 on FOTO in order to demonstrate improved perception of function due to symptoms.  Baseline: 55  Goal status: INITIAL    LONG TERM GOALS: Target date: 09/06/2023 Pt will score 64 on FOTO in order to demonstrate improved perception of function due to symptoms. Baseline: 55 Goal status: INITIAL  2.  Pt will demonstrate at least 140 degrees of active shoulder elevation on RUE in order to demonstrate improved tolerance to functional movement patterns such as reaching overhead. Baseline: see ROM chart above Goal status: INITIAL  3.  Pt will demonstrate at least 4+/5 shoulder ER/IR MMT on RUE for improved symmetry of UE strength and improved tolerance to functional movements.  Baseline: see MMT chart  above Goal status: INITIAL  4. Pt will report ability to perform upper body dressing with less than 2 point increase in pain on NPS in order to indicate improved tolerance/independence with ADLs.   Baseline: increased pain, requiring modification  Goal status: INITIAL   5. Pt will report at least 50% decrease in overall pain levels in past week in order to facilitate improved tolerance to basic ADLs/mobility.   Baseline: up to 10/10  Goal status: INITIAL    6. Pt will demonstrate at least 30# grip strength on RUE in order to facilitate improved functional strength.  Baseline: see MMT chart above  Goal status: INITIAL   PLAN:  PT FREQUENCY: 1-2x/week  PT DURATION: 8 weeks  PLANNED INTERVENTIONS: 97164- PT Re-evaluation, 97110-Therapeutic exercises, 97530- Therapeutic activity, 97112- Neuromuscular re-education, 97535- Self Care, 78295- Manual therapy, Patient/Family education, Balance training, Stair training, Taping, Dry Needling, Joint mobilization, Spinal mobilization, Cryotherapy, and Moist heat  PLAN FOR NEXT SESSION: Review/update HEP PRN. Work on [employer] exercises as appropriate with emphasis on postural strength, UE strength, and GH mobility. Symptom modification strategies as indicated/appropriate.    Sheliah Plane,  PT, DPT 08/28/2023, 11:30 AM

## 2023-08-29 ENCOUNTER — Ambulatory Visit: Payer: Medicaid Other | Attending: Family Medicine | Admitting: Physical Therapy

## 2023-08-29 ENCOUNTER — Encounter: Payer: Self-pay | Admitting: Family Medicine

## 2023-08-29 DIAGNOSIS — M6281 Muscle weakness (generalized): Secondary | ICD-10-CM | POA: Diagnosis not present

## 2023-08-29 DIAGNOSIS — M25611 Stiffness of right shoulder, not elsewhere classified: Secondary | ICD-10-CM | POA: Diagnosis not present

## 2023-08-29 DIAGNOSIS — M25511 Pain in right shoulder: Secondary | ICD-10-CM | POA: Insufficient documentation

## 2023-08-29 DIAGNOSIS — G8929 Other chronic pain: Secondary | ICD-10-CM | POA: Insufficient documentation

## 2023-08-29 NOTE — Therapy (Signed)
 OUTPATIENT PHYSICAL THERAPY TREATMENT NOTE   Patient Name: Kimberly Zhang MRN: [id number] DOB:[birth year], 58 y.o., female Today's Date: 08/29/2023  END OF SESSION:  PT End of Session - 08/29/23 1022     Visit Number 3    Number of Visits 17    Date for PT Re-Evaluation 09/06/23    PT Start Time 0915    PT Stop Time 1000    PT Time Calculation (min) 45 min    Activity Tolerance Patient tolerated treatment well    Behavior During Therapy [facility] for tasks assessed/performed               Past Medical History:  Diagnosis Date   Allergy    Anxiety    Asthma    Benign hematuria 2011   Ottelin   Hyperlipidemia    Migraine    Vitamin D  deficiency    Past Surgical History:  Procedure Laterality Date   ABDOMINAL HYSTERECTOMY  1998   COLONOSCOPY  2018   LEEP     POLYPECTOMY     TONSILECTOMY/ADENOIDECTOMY WITH MYRINGOTOMY     Patient Active Problem List   Diagnosis Date Noted   Transient ischemic attack (TIA) 12/10/2021   B12 deficiency 04/21/2021   Obesity (BMI 30.0-34.9) 12/01/2015   Female genuine stress incontinence 10/20/2014   Benign hematuria    Hyperlipidemia    Anxiety    Asthma    Migraine    Vitamin D  deficiency     PCP: Alvia Krabbe, FNP  REFERRING PROVIDER: Alvia Krabbe, FNP  REFERRING DIAG: M25.59 (ICD-10-CM) - Pain in other joint M25.511,G89.29 (ICD-10-CM) - Chronic right shoulder pain  THERAPY DIAG:  Chronic right shoulder pain  Muscle weakness (generalized)  Stiffness of right shoulder, not elsewhere classified  Rationale for Evaluation and Treatment: Rehabilitation  ONSET DATE: 2023  SUBJECTIVE:                                                                                                                                                                                      SUBJECTIVE STATEMENT: Shoulder feeling great, has started doing chair yoga, is able to tolerate full ROM and all Adls now Hand dominance:  Right  PERTINENT HISTORY: anxiety, asthma, migraines Reports stroke 2023 with residual deficits  PAIN:  Are you having pain: 3/10 Location/description: R shoulder/neck, tightness worst over past week: 10/10, fades slowly   - aggravating factors: manipulating objects (pots/pans), lifting arm, lying on R side, avoiding carrying - Easing factors: rest     PRECAUTIONS: R sided weakness/tremor   WEIGHT BEARING RESTRICTIONS: No  FALLS:  Has patient fallen in last 6 months? Yes. Number of falls 2 falls -  reports balance issues since stroke. 1 fall coming down steps, 1 fall in family room where she felt dizzy   LIVING ENVIRONMENT: 2 story home, requires stair navigation. Does better with ascending than descending. No STE. Has rail inside. 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  DIAGNOSTIC FINDINGS:  No recent shoulder imaging in chart  PATIENT SURVEYS:  FOTO 55 > 64  COGNITION: Overall cognitive status: Within functional limits for tasks assessed     SENSATION: Light touch intact/symmetrical BUE although pt does endorse tingling/tremor in RUE   POSTURE: Forward head, rounded shoulders  UPPER EXTREMITY ROM:  A/PROM Right eval Left eval  Shoulder flexion 98 deg * 162 deg  Shoulder abduction 82 deg * 150 deg  Shoulder internal rotation    Shoulder external rotation (functional) (About parallel with ear) T3  Elbow flexion    Elbow extension    Wrist flexion    Wrist extension     (Blank rows = not tested) (Key: WFL = within functional limits not formally assessed, * = concordant pain, s = stiffness/stretching sensation, NT = not tested)  Comments:    UPPER EXTREMITY MMT:  MMT Right eval  Left eval  Shoulder flexion  4+  Shoulder extension  4  Shoulder abduction    Shoulder extension    Shoulder internal rotation 3+ painless 4  Shoulder external rotation 3+ painless  4  Elbow flexion    Elbow extension    Grip strength 15# 50#  (Blank rows = not tested)  (Key: WFL = within functional limits not formally assessed, * = concordant pain, s = stiffness/stretching sensation, NT = not tested)  Comments:    PALPATION:  Quite tender throughout R UT, LS, rhomboid, mid trap, infraspinatus, teres, deltoid. Does report some relief with manual pressure as well   TODAY'S TREATMENT:    [facility] Adult PT Treatment:                                                DATE: 08/29/2023 Self care POC discussion Education on long term HEP, and application to daily life anatomy Therapeutic Activity: UBE 1' fwd/bkwd 5' total Sit to stand with overhead reach 6 heel taps w/UE assist, 2x8 Banded ER with flex ROM and retraction 2x8, 5s hold, Red Banded PNF D1 flexion 2x12,  3s hold, Blue Scapular retraction rows, black 2x8, 2s hold    [facility] Adult PT Treatment:                                                DATE: 08/01/2023  Therapeutic Exercise: UBE 1' fwd/bkwd RUE ranger 3x30s Flexion/Scaption Banded Scapular retractions 2x8, green  Neuromuscular re-ed: Banded PNF D1 flexion 1x8, yellow with manual follow Banded PND D2 extension 1x8, Yellow with maual follow  PATIENT EDUCATION: Education details: Pt education on PT impairments, prognosis, and POC. Informed consent. Rationale for interventions, safe/appropriate HEP performance Person educated: Patient Education method: Explanation, Demonstration, Tactile cues, Verbal cues, and Handouts Education comprehension: verbalized understanding, returned demonstration, verbal cues required, tactile cues required,  and needs further education    HOME EXERCISE PROGRAM: Access Code: [email or web address] URL: [email or web address] Date: 07/12/2023 Prepared by: Alm Jenny  Exercises - Seated Scapular Retraction  - 2-3 x daily - 1 sets - 8-10 reps - Seated Gripping Towel  - 2-3 x daily - 1 sets - 8-10 reps  ASSESSMENT:  CLINICAL IMPRESSION: Pt attended physical therapy session for continuation of treatment regarding R shoulder pain and dysfunction . Today's treatment focused on improvement of  overhead mobility and stability as well as transition into a long term HEP. Pt showed  great tolerance to treatment and demonstrated improvement with all aspects of RUE function, reporting no difficulties with ADLs or community participation in chair yoga. Pt required no cuing as well as no assistance for safe and appropriate performance of today's activities. RE-eval next session for consideration of d/c with long term HEP.    OBJECTIVE IMPAIRMENTS: decreased activity tolerance, decreased endurance, decreased mobility, decreased ROM, decreased strength, impaired perceived functional ability, impaired UE functional use, postural dysfunction, and pain.   ACTIVITY LIMITATIONS: carrying, lifting, sleeping, bathing, dressing, and hygiene/grooming  PARTICIPATION LIMITATIONS: meal prep, cleaning, laundry, driving, community activity, and occupation  PERSONAL FACTORS: Time since onset of injury/illness/exacerbation and 3+ comorbidities: anxiety, asthma, migraines, hx CVA  are also affecting patient's functional outcome.   REHAB POTENTIAL: Good  CLINICAL DECISION MAKING: Stable/uncomplicated  EVALUATION COMPLEXITY: Low   GOALS: Goals reviewed with patient? Yes  SHORT TERM GOALS: Target date: 08/09/2023 Pt will demonstrate appropriate understanding and performance of initially prescribed HEP in order to facilitate improved independence with management of symptoms.  Baseline: HEP provided on eval Goal status:  INITIAL   2. Pt will score greater than or equal to 60 on FOTO in order to demonstrate improved perception of function due to symptoms.  Baseline: 55  Goal status: INITIAL    LONG TERM GOALS: Target date: 09/06/2023 Pt will score 64 on FOTO in order to demonstrate improved perception of function due to symptoms. Baseline: 55 Goal status: INITIAL  2.  Pt will demonstrate at least 140 degrees of active shoulder elevation on RUE in order to demonstrate improved tolerance to functional movement patterns such as reaching overhead. Baseline: see ROM chart above Goal status: INITIAL  3.  Pt will demonstrate at least 4+/5 shoulder ER/IR MMT on RUE for improved symmetry of UE strength and improved tolerance to functional movements.  Baseline: see MMT chart above Goal status: INITIAL  4. Pt will report ability to perform upper body dressing with less than 2 point increase in pain on NPS in order to indicate improved tolerance/independence with ADLs.   Baseline: increased pain, requiring modification  Goal status: INITIAL   5. Pt will report at least 50% decrease in overall pain levels in past week in order to facilitate improved tolerance to basic ADLs/mobility.   Baseline: up to 10/10  Goal status: INITIAL    6. Pt will demonstrate at least 30# grip strength on RUE in order to facilitate improved functional strength.  Baseline: see MMT chart above  Goal status: INITIAL   PLAN:  PT FREQUENCY: 1-2x/week  PT DURATION: 8 weeks  PLANNED INTERVENTIONS: 97164- PT Re-evaluation, 97110-Therapeutic exercises, 97530- Therapeutic activity, [id number]- Neuromuscular re-education, 97535- Self  Care, [id number]- Manual therapy, Patient/Family education, Balance training, Stair training, Taping, Dry Needling, Joint mobilization, Spinal mobilization, Cryotherapy, and Moist heat  PLAN FOR NEXT SESSION: Review/update HEP PRN. Work on [employer] exercises as appropriate with emphasis on postural strength, UE  strength, and GH mobility. Symptom modification strategies as indicated/appropriate.    Mabel Kiang, PT, DPT 08/29/2023, 10:24 AM

## 2023-08-29 NOTE — Patient Instructions (Signed)
It was a pleasure speaking with you!  Bernita Raisin and Emgality have been approved. You can get them from the pharmacy.  Emgality will be 2 injections given consecutively the first month. The following months, it will be given just 1 injection monthly for migraine prevention.  Feel free to call with any questions or concerns!  Arbutus Leas, PharmD, BCPS, CPP Clinical Pharmacist Practitioner [facility] [phone]

## 2023-09-12 ENCOUNTER — Ambulatory Visit: Payer: Medicaid Other | Admitting: Physical Therapy

## 2023-09-12 DIAGNOSIS — M6281 Muscle weakness (generalized): Secondary | ICD-10-CM | POA: Diagnosis not present

## 2023-09-12 DIAGNOSIS — M25611 Stiffness of right shoulder, not elsewhere classified: Secondary | ICD-10-CM

## 2023-09-12 DIAGNOSIS — M25511 Pain in right shoulder: Secondary | ICD-10-CM | POA: Diagnosis not present

## 2023-09-12 DIAGNOSIS — G8929 Other chronic pain: Secondary | ICD-10-CM

## 2023-09-12 NOTE — Therapy (Signed)
OUTPATIENT PHYSICAL THERAPY TREATMENT & PROGRESS NOTE   Patient Name: Kimberly Zhang MRN: [id number] DOB:08-10-1965, 58 y.o., female Today's Date: 09/12/2023   PHYSICAL THERAPY DISCHARGE SUMMARY  Visits from Start of Care: 4  Current functional level related to goals / functional outcomes: All functional goals met except one, which showed significant progress.   Remaining deficits: Minimal muscle weakness   Education / Equipment: Long term HEP   Patient agrees to discharge. Patient goals were partially met. Patient is being discharged due to being pleased with the current functional level.  END OF SESSION:  PT End of Session - 09/12/23 0858     Visit Number 4    Number of Visits 17    Date for PT Re-Evaluation 09/06/23    PT Start Time 0905    PT Stop Time 0935    PT Time Calculation (min) 30 min    Activity Tolerance Patient tolerated treatment well    Behavior During Therapy [facility] for tasks assessed/performed                Past Medical History:  Diagnosis Date   Allergy    Anxiety    Asthma    Benign hematuria 2011   Ottelin   Hyperlipidemia    Migraine    Vitamin D deficiency    Past Surgical History:  Procedure Laterality Date   ABDOMINAL HYSTERECTOMY  1998   COLONOSCOPY  2018   LEEP     POLYPECTOMY     TONSILECTOMY/ADENOIDECTOMY WITH MYRINGOTOMY     Patient Active Problem List   Diagnosis Date Noted   Transient ischemic attack (TIA) 12/10/2021   B12 deficiency 04/21/2021   Obesity (BMI 30.0-34.9) 12/01/2015   Female genuine stress incontinence 10/20/2014   Benign hematuria    Hyperlipidemia    Anxiety    Asthma    Migraine    Vitamin D deficiency     PCP: Moshe Cipro, FNP  REFERRING PROVIDER: Moshe Cipro, FNP  REFERRING DIAG: M25.59 (ICD-10-CM) - Pain in other joint M25.511,G89.29 (ICD-10-CM) - Chronic right shoulder pain  THERAPY DIAG:  Chronic right shoulder pain  Muscle weakness  (generalized)  Stiffness of right shoulder, not elsewhere classified  Rationale for Evaluation and Treatment: Rehabilitation  ONSET DATE: 2023  SUBJECTIVE:                                                                                                                                                                                      SUBJECTIVE STATEMENT: Pt stated that she is doing much better, has no issues with ADLs or participation in community events such as yoga. Pt is able to pick up  grandchildren and function at the same level as PLOF. Still reports minimal weakness however is content with current functional level and is confident in ability to manage remaining symptoms at home. Hand dominance: Right  PERTINENT HISTORY: anxiety, asthma, migraines Reports stroke 2023 with residual deficits  PAIN:  Are you having pain: 3/10 Location/description: R shoulder/neck, tightness worst over past week: 10/10, fades slowly   - aggravating factors: manipulating objects (pots/pans), lifting arm, lying on R side, avoiding carrying - Easing factors: rest     PRECAUTIONS: R sided weakness/tremor   WEIGHT BEARING RESTRICTIONS: No  FALLS:  Has patient fallen in last 6 months? Yes. Number of falls 2 falls - reports balance issues since stroke. 1 fall coming down steps, 1 fall in family room where she felt dizzy   LIVING ENVIRONMENT: 2 story home, requires stair navigation. Does better with ascending than descending. No STE. Has rail inside. 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  DIAGNOSTIC FINDINGS:  No recent shoulder imaging in chart  PATIENT SURVEYS:  FOTO 55 > 64 09/12/2023 (83.4%)  COGNITION: Overall  cognitive status: Within functional limits for tasks assessed     SENSATION: Light touch intact/symmetrical BUE although pt does endorse tingling/tremor in RUE   POSTURE: Forward head, rounded shoulders  UPPER EXTREMITY ROM:  A/PROM Right eval 09/12/2023 Left eval 09/12/2023  Shoulder flexion 98 deg * 180 162 deg 180  Shoulder abduction 82 deg * 180 150 deg 180  Shoulder internal rotation  70 T10  70 T10  Shoulder external rotation (functional) (About parallel with ear) 90 T4 T3 90 T4  Elbow flexion      Elbow extension      Wrist flexion      Wrist extension       (Blank rows = not tested) (Key: WFL = within functional limits not formally assessed, * = concordant pain, s = stiffness/stretching sensation, NT = not tested)  Comments:    UPPER EXTREMITY MMT:  MMT Right eval 09/12/2023 Left eval 09/12/2023  Shoulder flexion  4+ 4+ 5  Shoulder extension  4+ 4   Shoulder abduction      Shoulder extension      Shoulder internal rotation 3+ painless 4+ 4 5  Shoulder external rotation 3+ painless  4 4 4+  Elbow flexion      Elbow extension      Grip strength 15# 60 50# 80  (Blank rows = not tested)  (Key: WFL = within functional limits not formally assessed, * = concordant pain, s = stiffness/stretching sensation, NT = not tested)  Comments:    PALPATION:  Quite tender throughout R UT, LS, rhomboid, mid trap, infraspinatus, teres, deltoid. Does report some relief with manual pressure as well   TODAY'S TREATMENT:   [facility] Adult PT Treatment:                                                DATE: 09/12/2023 Therapeutic Activity: Re-evaluative measures POC discussion Long term HEP performance and application to daily life     [facility] Adult PT Treatment:  DATE: 08/29/2023 Self care POC discussion Education on long term HEP, and application to daily life anatomy Therapeutic Activity: UBE 1' fwd/bkwd 5' total Sit to stand with  overhead reach 6" heel taps w/UE assist, 2x8 Banded ER with flex ROM and retraction 2x8, 5s hold, Red Banded PNF D1 flexion 2x12,  3s hold, Blue Scapular retraction rows, black 2x8, 2s hold    [facility] Adult PT Treatment:                                                DATE: 08/01/2023  Therapeutic Exercise: UBE 1' fwd/bkwd RUE ranger 3x30s Flexion/Scaption Banded Scapular retractions 2x8, green  Neuromuscular re-ed: Banded PNF D1 flexion 1x8, yellow with manual follow Banded PND D2 extension 1x8, Yellow with maual follow                                                                                                                                           PATIENT EDUCATION: Education details: Pt education on PT impairments, prognosis, and POC. Informed consent. Rationale for interventions, safe/appropriate HEP performance Person educated: Patient Education method: Explanation, Demonstration, Tactile cues, Verbal cues, and Handouts Education comprehension: verbalized understanding, returned demonstration, verbal cues required, tactile cues required, and needs further education    HOME EXERCISE PROGRAM: Access Code: [email or web address] URL: [email or web address] Date: 09/12/2023 Prepared by: Sheliah Plane  Exercises - Shoulder extension with resistance - Neutral  - 1 x daily - 7 x weekly - 2-3 sets - 12 reps - Shoulder Flexion Serratus Activation with Resistance  - 1 x daily - 7 x weekly - 2-3 sets - 8 reps - 5s hold - Standing Shoulder Single Arm PNF D2 Flexion with Anchored Resistance  - 1 x daily - 7 x weekly - 2-3 sets - 12 reps - 3s hold - Standing Shoulder Row with Anchored Resistance  - 1 x daily - 7 x weekly - 2-3 sets - 12 reps - 3s hold  ASSESSMENT:  CLINICAL IMPRESSION: Pt attended physical therapy session for re-evaluation of R shoulder dysfunction. Pt has made significant progress in all  areas of current POC. Pt has met  all except one goal and continues to work  towards muscle strength globally in RUE. Pt reports having no functional difficulties with daily life or participation in community activities such as yoga. Pt is content with current functional level and no longer requires skilled Outpatient physical therapy services. Pt required no cuing as well as no assistance for safe and appropriate performance of today's activities. Pt is to be discharged at completion of session.     OBJECTIVE IMPAIRMENTS: decreased activity tolerance, decreased endurance, decreased mobility, decreased ROM, decreased strength, impaired perceived functional ability, impaired UE functional use, postural dysfunction, and pain.  ACTIVITY LIMITATIONS: carrying, lifting, sleeping, bathing, dressing, and hygiene/grooming  PARTICIPATION LIMITATIONS: meal prep, cleaning, laundry, driving, community activity, and occupation  PERSONAL FACTORS: Time since onset of injury/illness/exacerbation and 3+ comorbidities: anxiety, asthma, migraines, hx CVA  are also affecting patient's functional outcome.   REHAB POTENTIAL: Good  CLINICAL DECISION MAKING: Stable/uncomplicated  EVALUATION COMPLEXITY: Low   GOALS: Goals reviewed with patient? Yes  SHORT TERM GOALS: Target date: 08/09/2023 Pt will demonstrate appropriate understanding and performance of initially prescribed HEP in order to facilitate improved independence with management of symptoms.  Baseline: HEP provided on eval Goal status: MET 09/12/2023  2. Pt will score greater than or equal to 60 on FOTO in order to demonstrate improved perception of function due to symptoms.  Baseline: 55  Goal status: MET 09/12/2023  LONG TERM GOALS: Target date: 09/06/2023 Pt will score 64 on FOTO in order to demonstrate improved perception of function due to symptoms. Baseline: 55 Goal status:MET 09/12/2023  2.  Pt will demonstrate at least 140 degrees of active shoulder elevation on RUE in order to demonstrate improved tolerance to  functional movement patterns such as reaching overhead. Baseline: see ROM chart above Goal status: MET 2/18/2025L  3.  Pt will demonstrate at least 4+/5 shoulder ER/IR MMT on RUE for improved symmetry of UE strength and improved tolerance to functional movements.  Baseline: see MMT chart above Goal status: PROGRESSING 09/12/2023  4. Pt will report ability to perform upper body dressing with less than 2 point increase in pain on NPS in order to indicate improved tolerance/independence with ADLs.   Baseline: increased pain, requiring modification  Goal status: MET 09/12/2023  5. Pt will report at least 50% decrease in overall pain levels in past week in order to facilitate improved tolerance to basic ADLs/mobility.   Baseline: up to 10/10  Goal status:MET 09/12/2023  6. Pt will demonstrate at least 30# grip strength on RUE in order to facilitate improved functional strength.  Baseline: see MMT chart above  Goal status: MET 09/12/2023 PLAN:  PT FREQUENCY: 1-2x/week  PT DURATION: 8 weeks  PLANNED INTERVENTIONS: 97164- PT Re-evaluation, 97110-Therapeutic exercises, 97530- Therapeutic activity, 97112- Neuromuscular re-education, 97535- Self Care, 16109- Manual therapy, Patient/Family education, Balance training, Stair training, Taping, Dry Needling, Joint mobilization, Spinal mobilization, Cryotherapy, and Moist heat  PLAN FOR NEXT SESSION: Review/update HEP PRN. Work on [employer] exercises as appropriate with emphasis on postural strength, UE strength, and GH mobility. Symptom modification strategies as indicated/appropriate.    Sheliah Plane, PT, DPT 09/12/2023, 9:44 AM

## 2023-09-21 ENCOUNTER — Other Ambulatory Visit: Payer: Self-pay | Admitting: Internal Medicine

## 2023-09-21 ENCOUNTER — Other Ambulatory Visit: Payer: Self-pay | Admitting: Family Medicine

## 2023-09-21 DIAGNOSIS — G43701 Chronic migraine without aura, not intractable, with status migrainosus: Secondary | ICD-10-CM

## 2023-09-21 DIAGNOSIS — H1033 Unspecified acute conjunctivitis, bilateral: Secondary | ICD-10-CM

## 2023-10-23 ENCOUNTER — Ambulatory Visit: Payer: Medicaid Other | Attending: Internal Medicine | Admitting: Internal Medicine

## 2023-10-23 ENCOUNTER — Encounter: Payer: Self-pay | Admitting: Internal Medicine

## 2023-10-23 VITALS — BP 122/80 | HR 57 | Ht 63.0 in | Wt 172.6 lb

## 2023-10-23 DIAGNOSIS — Z789 Other specified health status: Secondary | ICD-10-CM | POA: Diagnosis not present

## 2023-10-23 DIAGNOSIS — E7801 Familial hypercholesterolemia: Secondary | ICD-10-CM | POA: Diagnosis not present

## 2023-10-23 DIAGNOSIS — Z8673 Personal history of transient ischemic attack (TIA), and cerebral infarction without residual deficits: Secondary | ICD-10-CM

## 2023-10-23 LAB — HEPATIC FUNCTION PANEL
ALT: 15 IU/L (ref 0–32)
AST: 16 IU/L (ref 0–40)
Albumin: 4.2 g/dL (ref 3.8–4.9)
Alkaline Phosphatase: 122 IU/L — ABNORMAL HIGH (ref 44–121)
Bilirubin Total: 0.4 mg/dL (ref 0.0–1.2)
Bilirubin, Direct: 0.1 mg/dL (ref 0.00–0.40)
Total Protein: 6.2 g/dL (ref 6.0–8.5)

## 2023-10-23 LAB — LIPID PANEL
Chol/HDL Ratio: 5.7 ratio — ABNORMAL HIGH (ref 0.0–4.4)
Cholesterol, Total: 249 mg/dL — ABNORMAL HIGH (ref 100–199)
HDL: 44 mg/dL (ref >39)
LDL Chol Calc (NIH): 174 mg/dL — ABNORMAL HIGH (ref 0–99)
Triglycerides: 170 mg/dL — ABNORMAL HIGH (ref 0–149)
VLDL Cholesterol Cal: 31 mg/dL (ref 5–40)

## 2023-10-23 NOTE — Progress Notes (Signed)
 LIPID CLINIC CONSULT NOTE  Chief Complaint:  Follow-up dyslipidemia  Primary Care Physician: Sherald Barge, FNP  Primary Cardiologist:  Chrystie Nose, MD  HPI:  Kimberly Zhang is a 58 y.o. female who is being seen today for the evaluation of lipidemia at the request of Sherald Barge, *.  This is a pleasant 58 year old female kindly referred for evaluation management of dyslipidemia.  She has a strong family history of high cholesterol in her mom and her sister who also had coronary artery disease and her son who has high cholesterol.  She also has a history of prior stroke and therefore her target LDL is less than 70.  Unfortunately she could not tolerate statins having tried atorvastatin and rosuvastatin both of which caused severe nausea and GI discomfort.  Her last lipids were in May 2023 showing total cholesterol 386, triglycerides 190, HDL 40 and LDL 309.  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  She reports his diet low in saturated fats.  05/31/2022  Ms. Legacy returns today for follow-up. She reports that she has been using Repatha every 2 weeks.  No concerns regarding any side effects with the medication.  Unfortunately, she did not have labs repeated prior to this visit.  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  She reports she has had some short-term memory issues related to TIAs which is improving somewhat.  She understands that the E4 allele is associated with potential increased risk of Alzheimer's dementia that does run in her family.  10/23/2023  Ms. Kellen is seen today in follow-up.  Unfortunately she just had her labs drawn today.  She had labs about 3 months ago on December 30 and her cholesterol was very  high with total 365, triglycerides 184, HDL 41 and LDL 287 which was her lipid level before starting Repatha.  On therapy her LDL was down to 121.  Apparently at this time she had run out of her medication and she just had reauthorization of her Repatha on December 24.  I expect her labs are likely back to what we saw a year ago.  She may however need additional therapy.  PMHx:  Past Medical History:  Diagnosis Date   Allergy    Anxiety    Asthma    Benign hematuria 2011   Ottelin   Hyperlipidemia    Migraine    Vitamin D deficiency     Past Surgical History:  Procedure Laterality Date   ABDOMINAL HYSTERECTOMY  1998   COLONOSCOPY  2018   LEEP     POLYPECTOMY     TONSILECTOMY/ADENOIDECTOMY WITH MYRINGOTOMY      FAMHx:  Family History  Problem Relation Age of Onset   Hyperlipidemia Mother    Migraines Mother    Liver disease Father    Healthy Sister    Healthy Daughter    Heart disease Maternal Aunt    Cancer Maternal Aunt    Cancer Maternal Uncle    Diabetes Paternal Grandmother    Healthy Son    Depression Other    Alzheimer's disease Other    Colon cancer Neg Hx    Esophageal cancer Neg Hx    Rectal cancer Neg Hx    Stomach cancer Neg Hx    Colon polyps Neg Hx  Breast cancer Neg Hx     SOCHx:   reports that she has been smoking cigarettes. She has been exposed to tobacco smoke. She has never used smokeless tobacco. She reports current alcohol use. She reports that she does not use drugs.  ALLERGIES:  Allergies  Allergen Reactions   Shellfish Allergy Anaphylaxis   Shellfish-Derived Products Anaphylaxis   Atorvastatin     Severe nausea   Rosuvastatin     Nausea, GI    ROS: Pertinent items noted in HPI and remainder of comprehensive ROS otherwise negative.  HOME MEDS: Current Outpatient Medications on File Prior to Visit  Medication Sig Dispense Refill   Evolocumab (REPATHA SURECLICK) 140 MG/ML SOAJ Inject 140 mg into the skin every 14 (fourteen)  days. 2 mL 6   fluticasone (FLONASE) 50 MCG/ACT nasal spray Place 2 sprays into both nostrils daily. 16 g 6   Galcanezumab-gnlm (EMGALITY) 120 MG/ML SOAJ Inject 120 mg into the skin every 30 (thirty) days. 3 mL 3   levocetirizine (XYZAL) 5 MG tablet TAKE 1 TABLET BY MOUTH DAILY FOR ALLERGIES 90 tablet 1   Ubrogepant (UBRELVY) 50 MG TABS Take 1 tablet (50 mg total) by mouth as needed. 90 tablet 1   escitalopram (LEXAPRO) 10 MG tablet Take 1 tablet (10 mg total) by mouth daily. 90 tablet 1   promethazine (PHENERGAN) 6.25 MG/5ML solution Take 5 mLs (6.25 mg total) by mouth every 6 (six) hours as needed (cough). (Patient not taking: Reported on 10/23/2023) 120 mL 0   No current facility-administered medications on file prior to visit.    LABS/IMAGING: No results found for this or any previous visit (from the past 48 hours). No results found.  LIPID PANEL:    Component Value Date/Time   CHOL 365 (H) 07/24/2023 1030   TRIG 184.0 (H) 07/24/2023 1030   HDL 41.10 07/24/2023 1030   CHOLHDL 9 07/24/2023 1030   VLDL 36.8 07/24/2023 1030   LDLCALC 287 (H) 07/24/2023 1030   LDLCALC 121 (H) 12/08/2022 0959    WEIGHTS: Wt Readings from Last 3 Encounters:  10/23/23 172 lb 9.6 oz (78.3 kg)  08/07/23 180 lb (81.6 kg)  07/24/23 180 lb 6.4 oz (81.8 kg)    VITALS: BP 122/80 (BP Location: Left Arm, Patient Position: Sitting, Cuff Size: Normal)   Pulse (!) 57   Ht 5\' 3"  (1.6 m)   Wt 172 lb 9.6 oz (78.3 kg)   SpO2 92%   BMI 30.57 kg/m   EXAM: Deferred  EKG: Deferred  ASSESSMENT: 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   Ms. Calma had some repeat labs drawn today which will not be available likely for several days.  Her last cholesterol was very high however I believe she  was off therapy awaiting for reauthorization.  I will get back to her on the results of these labs.  She may need additional therapy is her best LDL was still at 121.  Follow-up with APP annually or sooner as necessary.  Chrystie Nose, MD, [facility], FACP  [place]  [facility]  Medical Director of the Advanced Lipid Disorders &  Cardiovascular Risk Reduction Clinic Diplomate of the American Board of Clinical Lipidology Attending Cardiologist  Direct Dial: [phone]  Fax: [phone]  Website:  [email or web address] Nicely Renald Haithcock 10/23/2023, 9:03 AM

## 2023-10-23 NOTE — Patient Instructions (Signed)
 Medication Instructions:  NO CHANGES today -- will depend on lab results  *If you need a refill on your cardiac medications before your next appointment, please call your pharmacy*   Follow-Up: At [facility], you and your health needs are our priority.  As part of our continuing mission to provide you with exceptional heart care, our providers are all part of one team.  This team includes your primary Cardiologist (physician) and Advanced Practice Providers or APPs (Physician Assistants and Nurse Practitioners) who all work together to provide you with the care you need, when you need it.  Your next appointment:   12 months with Dr. Rennis Golden or Eligha Bridegroom NP for lipid clinic  We recommend signing up for the patient portal called "MyChart".  Sign up information is provided on this After Visit Summary.  MyChart is used to connect with patients for Virtual Visits (Telemedicine).  Patients are able to view lab/test results, encounter notes, upcoming appointments, etc.  Non-urgent messages can be sent to your provider as well.   To learn more about what you can do with MyChart, go to [email or web address].        1st Floor: - Lobby - Registration  - Pharmacy  - Lab - Cafe  2nd Floor: - PV Lab - Diagnostic Testing (echo, CT, nuclear med)  3rd Floor: - Vacant  4th Floor: - TCTS (cardiothoracic surgery) - AFib Clinic - Structural Heart Clinic - Vascular Surgery  - Vascular Ultrasound  5th Floor: - HeartCare Cardiology (general and EP) - Clinical Pharmacy for coumadin, hypertension, lipid, weight-loss medications, and med management appointments    Valet parking services will be available as well.

## 2023-10-27 ENCOUNTER — Encounter: Payer: Self-pay | Admitting: *Deleted

## 2023-10-27 DIAGNOSIS — E7801 Familial hypercholesterolemia: Secondary | ICD-10-CM

## 2023-10-27 DIAGNOSIS — Z789 Other specified health status: Secondary | ICD-10-CM

## 2023-11-08 ENCOUNTER — Telehealth: Payer: Self-pay | Admitting: Pharmacy Technician

## 2023-11-08 MED ORDER — NEXLIZET 180-10 MG PO TABS: 1.0000 | ORAL_TABLET | Freq: Every day | ORAL | 3 refills | Status: DC

## 2023-11-08 NOTE — Telephone Encounter (Signed)
 Pt is returning call.

## 2023-11-08 NOTE — Telephone Encounter (Signed)
 Pharmacy Patient Advocate Encounter   Received notification from CoverMyMeds that prior authorization for nexlizet is required/requested.   Insurance verification completed.   The patient is insured through [facility] .   Per test claim: PA required; PA submitted to above mentioned insurance via CoverMyMeds Key/confirmation #/EOC [id number] Status is pending

## 2023-11-08 NOTE — Telephone Encounter (Signed)
 Pt informed of providers result & recommendations. Pt verbalized understanding. She will start Nexlizet and fasting lab in 3 months. All orders entered.

## 2023-11-09 NOTE — Telephone Encounter (Signed)
 Pharmacy Patient Advocate Encounter  Received notification from [facility] that Prior Authorization for nexlizet has been APPROVED from 11/09/23 to 05/10/24. Spoke to pharmacy to process.Copay is $4.00.    PA #/Case ID/Reference #: [id number]

## 2023-11-14 ENCOUNTER — Other Ambulatory Visit: Payer: Self-pay | Admitting: *Deleted

## 2023-11-14 DIAGNOSIS — Z789 Other specified health status: Secondary | ICD-10-CM

## 2023-11-14 DIAGNOSIS — E7801 Familial hypercholesterolemia: Secondary | ICD-10-CM

## 2023-11-14 NOTE — Telephone Encounter (Signed)
 Lab order mailed - due late July 2025

## 2023-11-20 ENCOUNTER — Other Ambulatory Visit: Payer: Self-pay

## 2023-11-20 ENCOUNTER — Other Ambulatory Visit (HOSPITAL_COMMUNITY): Payer: Self-pay

## 2023-11-20 ENCOUNTER — Telehealth: Payer: Self-pay

## 2023-11-20 DIAGNOSIS — G43701 Chronic migraine without aura, not intractable, with status migrainosus: Secondary | ICD-10-CM

## 2023-11-20 DIAGNOSIS — Z8673 Personal history of transient ischemic attack (TIA), and cerebral infarction without residual deficits: Secondary | ICD-10-CM

## 2023-11-20 NOTE — Telephone Encounter (Signed)
 Clinical questions have been answered and PA submitted. PA currently Pending.

## 2023-11-20 NOTE — Telephone Encounter (Signed)
 Pharmacy Patient Advocate Encounter   Received notification from CoverMyMeds that prior authorization for Emgality  120MG /ML auto-injectors (migraine) is due for renewal.   Insurance verification completed.   The patient is insured through [facility]  Action: PA required; PA started via CoverMyMeds. KEY [id number] . Please see clinical question(s) below that I am not finding the answer to in her chart and advise.

## 2023-11-20 NOTE — Telephone Encounter (Signed)
 ERROR

## 2023-11-20 NOTE — Telephone Encounter (Signed)
 Patient would like a PT referral for her headaches, she also states she did Accupuncture in the past and it helped a whole lot with her headaches. She does feel that since her stroke, headaches have become more painful.

## 2023-11-20 NOTE — Telephone Encounter (Signed)
 Yes -Patient has noticed a decrease in the number of headaches, headaches are still lasting about a week when they to present. Patient feels if we continue the Emgality , the more her body adjust the headaches will continue to improve.   Yes - Patient is continuing to utilize prophylactic intervention, she has been practicing chair yoga, doing meditations, and she is agreeable to us  placing a PT referral for her headaches today. She also has tried acupuncture  Yes

## 2023-11-21 ENCOUNTER — Other Ambulatory Visit (HOSPITAL_COMMUNITY): Payer: Self-pay

## 2023-11-21 NOTE — Telephone Encounter (Signed)
 Pharmacy Patient Advocate Encounter  Received notification from [facility] that Prior Authorization for Emgality  120mg Abbie Hock been Approved until 11/22/23.   Placed a call to [facility] to notify of the approval, spoke with Josiah Nigh and he filled the order for a 3 month supply so the patient would not be out of the med for a couple of months.   Indexed approval letter to media tab.

## 2023-11-27 ENCOUNTER — Ambulatory Visit: Attending: Family Medicine

## 2023-11-27 DIAGNOSIS — G43701 Chronic migraine without aura, not intractable, with status migrainosus: Secondary | ICD-10-CM | POA: Diagnosis not present

## 2023-11-27 DIAGNOSIS — M6281 Muscle weakness (generalized): Secondary | ICD-10-CM | POA: Diagnosis not present

## 2023-11-27 DIAGNOSIS — R293 Abnormal posture: Secondary | ICD-10-CM | POA: Diagnosis not present

## 2023-11-27 DIAGNOSIS — Z8673 Personal history of transient ischemic attack (TIA), and cerebral infarction without residual deficits: Secondary | ICD-10-CM | POA: Diagnosis not present

## 2023-11-27 DIAGNOSIS — R278 Other lack of coordination: Secondary | ICD-10-CM | POA: Insufficient documentation

## 2023-11-27 NOTE — Therapy (Signed)
 OUTPATIENT PHYSICAL THERAPY NEURO EVALUATION   Patient Name: VINIA MUNCK MRN: [id number] DOB:Feb 07, 1966, 58 y.o., female Today's Date: 11/27/2023   PCP: Casimer Clear, FNP REFERRING PROVIDER: Casimer Clear, FNP  END OF SESSION:  PT End of Session - 11/27/23 1008     Visit Number 1    Number of Visits 5    Date for PT Re-Evaluation 12/29/23    Authorization Type [employer]    PT Start Time 1009    PT Stop Time 1048    PT Time Calculation (min) 39 min    Activity Tolerance Patient tolerated treatment well    Behavior During Therapy [facility] for tasks assessed/performed             Past Medical History:  Diagnosis Date   Allergy    Anxiety    Asthma    Benign hematuria 2011   Ottelin   Hyperlipidemia    Migraine    Vitamin D  deficiency    Past Surgical History:  Procedure Laterality Date   ABDOMINAL HYSTERECTOMY  1998   COLONOSCOPY  2018   LEEP     POLYPECTOMY     TONSILECTOMY/ADENOIDECTOMY WITH MYRINGOTOMY     Patient Active Problem List   Diagnosis Date Noted   Transient ischemic attack (TIA) 12/10/2021   B12 deficiency 04/21/2021   Obesity (BMI 30.0-34.9) 12/01/2015   Female genuine stress incontinence 10/20/2014   Benign hematuria    Hyperlipidemia    Anxiety    Asthma    Migraine    Vitamin D  deficiency     ONSET DATE: 11/20/23 referral  REFERRING DIAG:  G43.701 (ICD-10-CM) - Chronic migraine without aura with status migrainosus, not intractable  Z86.73 (ICD-10-CM) - History of stroke    THERAPY DIAG:  Muscle weakness (generalized) - Plan: PT plan of care cert/re-cert  Abnormal posture - Plan: PT plan of care cert/re-cert  Other lack of coordination - Plan: PT plan of care cert/re-cert  Rationale for Evaluation and Treatment: Rehabilitation  SUBJECTIVE:                                                                                                                                                                                              SUBJECTIVE STATEMENT: Patient arrives to clinic alone, no AD. She reports having had the same migraine for the past 4 weeks. Since stroke in 2023, she states that her migraines are more intense and longer. She denies any known triggers. Endorses vertigo when doing down the stairs, starting maybe last year. States that this is primarily the only time she experiences this sensation.  Pt accompanied by:  self  PERTINENT HISTORY: CVA, anxiety, HLD, migraines, Vit D deficiency   PAIN:  Are you having pain? Yes: NPRS scale: 5/10 Pain location: HA, B crown of head Pain description: nagging Aggravating factors: laying down Relieving factors: pushing on certain pressure points  PRECAUTIONS: Fall   WEIGHT BEARING RESTRICTIONS: No  FALLS: Has patient fallen in last 6 months? Yes. Number of falls 2; going up the steps, "stepped wrong" in the kitchen   LIVING ENVIRONMENT: Lives with: lives with their family Lives in: House/apartment Stairs: Yes: Internal: flight steps; on right going up and External: 1 steps; none Has following equipment at home: Single point cane  PLOF: Independent driving  PATIENT GOALS: "show me some way to relieve this"  OBJECTIVE:  Note: Objective measures were completed at Evaluation unless otherwise noted.  DIAGNOSTIC FINDINGS: none recent  COGNITION: Overall cognitive status: Impaired   SENSATION: WFL   EDEMA:  Reports R ankle intermittent swelling  L arm 5cm distal to AC: 29.5cm R arm 5cm distal to AC: 27.5cm  POSTURE: rounded shoulders and forward head  BED MOBILITY:  Independent    PATIENT SURVEYS:  Headache disability index: 40                                                                                                                              TREATMENT  Self care/home management: -review of HA triggers, role of PT in migraine management, pain cycle re: HA, initial HEP, contact PCP re: swelling in L arm and medication  dose -trial chirp wheel (patient too flared up on eval to tolerate)   PATIENT EDUCATION: Education details: see above, PT POC, exam findings Person educated: Patient Education method: Explanation, Demonstration, and Handouts Education comprehension: verbalized understanding and needs further education  HOME EXERCISE PROGRAM: Access Code: [email or web address] URL: [email or web address] Date: 11/27/2023 Prepared by: Nickola Baron  Exercises - Seated Upper Trapezius Stretch  - 1 x daily - 7 x weekly - 3 sets - 30s hold - Seated Levator Scapulae Stretch  - 1 x daily - 7 x weekly - 3 sets - 30s hold - Sternocleidomastoid Stretch  - 1 x daily - 7 x weekly - 3 sets - 30s hold - Seated Cervical Flexion Stretch with Finger Support Behind Neck  - 1 x daily - 7 x weekly - 3 sets - 30s hold  GOALS: Goals reviewed with patient? Yes  SHORT TERM GOALS: = LTG based on PT POC length   LONG TERM GOALS: Target date: 12/29/23  Pt will be independent with final HEP for improved symptom report  Baseline: to be updated  Goal status: INITIAL  2.  Patient will improve HDI score by at least 29 points to reflect a clinically significant improvement in her symptoms Baseline: 40 Goal status: INITIAL  ASSESSMENT:  CLINICAL IMPRESSION: Patient is a 58 y.o. female who was seen today for physical therapy evaluation and treatment for headaches. She does have a history of migraines  and is on medication for this. Currently, she presents with palpable trigger points to her R upper trap and rhomboids, likely causing a tension HA contributing to her symptoms and severity of her HA. She scored a 40 on the HDI reflecting increased levels of disability due to her migraines. She would benefit from skilled PT services to address the above mentioned deficits.    OBJECTIVE IMPAIRMENTS: decreased knowledge of condition, dizziness, increased edema, increased fascial restrictions, increased muscle spasms, postural  dysfunction, and pain.   ACTIVITY LIMITATIONS: carrying, lifting, bed mobility, locomotion level, and caring for others  PARTICIPATION LIMITATIONS: interpersonal relationship, shopping, and community activity  PERSONAL FACTORS: Age, Past/current experiences, Social background, and Time since onset of injury/illness/exacerbation are also affecting patient's functional outcome.   REHAB POTENTIAL: Fair etiology  CLINICAL DECISION MAKING: Stable/uncomplicated  EVALUATION COMPLEXITY: Low  PLAN:  PT FREQUENCY: 1x/week  PT DURATION: 4 weeks  PLANNED INTERVENTIONS: 97164- PT Re-evaluation, 97750- Physical Performance Testing, 97110-Therapeutic exercises, 97530- Therapeutic activity, [id number]- Neuromuscular re-education, 97535- Self Care, 25956- Manual therapy, [id number]- Gait training, [phone]- Canalith repositioning, [id number]- Aquatic Therapy, [phone]- Electrical stimulation (manual), Patient/Family education, Balance training, Stair training, Taping, Dry Needling, Joint mobilization, Spinal mobilization, Vestibular training, Visual/preceptual remediation/compensation, DME instructions, Cryotherapy, and Moist heat  PLAN FOR NEXT SESSION: TPDN?, cervical stretching, posture retraining, did she contact PCP re: swelling and rx dose?   Rebecca Campus, PT Rebecca Campus, PT, DPT, CBIS  11/27/2023, 11:13 AM

## 2023-12-05 ENCOUNTER — Ambulatory Visit: Admitting: Physical Therapy

## 2023-12-05 DIAGNOSIS — R278 Other lack of coordination: Secondary | ICD-10-CM | POA: Diagnosis not present

## 2023-12-05 DIAGNOSIS — M6281 Muscle weakness (generalized): Secondary | ICD-10-CM | POA: Diagnosis not present

## 2023-12-05 DIAGNOSIS — G43701 Chronic migraine without aura, not intractable, with status migrainosus: Secondary | ICD-10-CM | POA: Diagnosis not present

## 2023-12-05 DIAGNOSIS — R293 Abnormal posture: Secondary | ICD-10-CM

## 2023-12-05 DIAGNOSIS — Z8673 Personal history of transient ischemic attack (TIA), and cerebral infarction without residual deficits: Secondary | ICD-10-CM | POA: Diagnosis not present

## 2023-12-05 NOTE — Therapy (Signed)
 OUTPATIENT PHYSICAL THERAPY NEURO TREATMENT   Patient Name: Kimberly Zhang MRN: [id number] DOB:1966/06/17, 58 y.o., female Today's Date: 12/05/2023   PCP: Casimer Clear, FNP REFERRING PROVIDER: Casimer Clear, FNP  END OF SESSION:  PT End of Session - 12/05/23 1450     Visit Number 2    Number of Visits 5    Date for PT Re-Evaluation 12/29/23    Authorization Type [employer]    PT Start Time 1446    PT Stop Time 1524    PT Time Calculation (min) 38 min    Activity Tolerance Patient tolerated treatment well    Behavior During Therapy [facility] for tasks assessed/performed              Past Medical History:  Diagnosis Date   Allergy    Anxiety    Asthma    Benign hematuria 2011   Ottelin   Hyperlipidemia    Migraine    Vitamin D  deficiency    Past Surgical History:  Procedure Laterality Date   ABDOMINAL HYSTERECTOMY  1998   COLONOSCOPY  2018   LEEP     POLYPECTOMY     TONSILECTOMY/ADENOIDECTOMY WITH MYRINGOTOMY     Patient Active Problem List   Diagnosis Date Noted   Transient ischemic attack (TIA) 12/10/2021   B12 deficiency 04/21/2021   Obesity (BMI 30.0-34.9) 12/01/2015   Female genuine stress incontinence 10/20/2014   Benign hematuria    Hyperlipidemia    Anxiety    Asthma    Migraine    Vitamin D  deficiency     ONSET DATE: 11/20/23 referral  REFERRING DIAG:  G43.701 (ICD-10-CM) - Chronic migraine without aura with status migrainosus, not intractable  Z86.73 (ICD-10-CM) - History of stroke    THERAPY DIAG:  Muscle weakness (generalized)  Abnormal posture  Rationale for Evaluation and Treatment: Rehabilitation  SUBJECTIVE:                                                                                                                                                                                             SUBJECTIVE STATEMENT: Pt with ongoing headache pain on R side/temple region, pain is pretty constant. Pt also had  tremors in her R hand as well, symptoms are usually on her R side.Pt with ongoing swelling in her L forearm region, just waiting to be scheduled for an appointment with her doctor.  Pt does feel vertigo occasionally when trying to go down steps, did happen today. Pt can feel it start to come on.  Pt accompanied by: self  PERTINENT HISTORY: CVA, anxiety, HLD, migraines, Vit D deficiency   PAIN:  Are you having pain? Yes: NPRS scale: 5/10 Pain location: HA, B crown of head Pain description: nagging Aggravating factors: laying down Relieving factors: pushing on certain pressure points  PRECAUTIONS: Fall   WEIGHT BEARING RESTRICTIONS: No  FALLS: Has patient fallen in last 6 months? Yes. Number of falls 2; going up the steps, "stepped wrong" in the kitchen   LIVING ENVIRONMENT: Lives with: lives with their family Lives in: House/apartment Stairs: Yes: Internal: flight steps; on right going up and External: 1 steps; none Has following equipment at home: Single point cane  PLOF: Independent driving  PATIENT GOALS: "show me some way to relieve this"  OBJECTIVE:  Note: Objective measures were completed at Evaluation unless otherwise noted.  DIAGNOSTIC FINDINGS: none recent  COGNITION: Overall cognitive status: Impaired   SENSATION: WFL   EDEMA:  Reports R ankle intermittent swelling  L arm 5cm distal to AC: 29.5cm R arm 5cm distal to AC: 27.5cm  POSTURE: rounded shoulders and forward head  BED MOBILITY:  Independent    PATIENT SURVEYS:  Headache disability index: 40                                                                                                                              TREATMENT  TherAct Trigger Point Dry Needling  Initial Treatment: Pt instructed on Dry Needling rational, procedures, and possible side effects. Pt instructed to expect mild to moderate muscle soreness later in the day and/or into the next day.  Pt instructed in methods to  reduce muscle soreness. Pt instructed to continue prescribed HEP. Because Dry Needling was performed over or adjacent to a lung field, pt was educated on S/S of pneumothorax and to seek immediate medical attention should they occur.  Patient was educated on signs and symptoms of infection and other risk factors and advised to seek medical attention should they occur.  Patient verbalized understanding of these instructions and education.   Patient Verbal Consent Given: Yes Education Handout Provided: Yes Muscles Treated: R suboccipitals, R cervical paraspinals, R UT Electrical Stimulation Performed: No Treatment Response/Outcome: deep ache/pressure; muscle twitch detected; pt no longer has lingering headache pain around R temple  Trigger Point Dry Needling  What is Trigger Point Dry Needling (DN)? DN is a physical therapy technique used to treat muscle pain and dysfunction. Specifically, DN helps deactivate muscle trigger points (muscle knots).  A thin filiform needle is used to penetrate the skin and stimulate the underlying trigger point. The goal is for a local twitch response (LTR) to occur and for the trigger point to relax. No medication of any kind is injected during the procedure.   What Does Trigger Point Dry Needling Feel Like?  The procedure feels different for each individual patient. Some patients report that they do not actually feel the needle enter the skin and overall the process is not painful. Very mild bleeding may occur. However, many patients feel a deep cramping in the muscle  in which the needle was inserted. This is the local twitch response.   How Will I feel after the treatment? Soreness is normal, and the onset of soreness may not occur for a few hours. Typically this soreness does not last longer than two days.  Bruising is uncommon, however; ice can be used to decrease any possible bruising.  In rare cases feeling tired or nauseous after the treatment is normal. In  addition, your symptoms may get worse before they get better, this period will typically not last longer than 24 hours.   What Can I do After My Treatment? Increase your hydration by drinking more water for the next 24 hours.  You may place ice or heat on the areas treated that have become sore, however, do not use heat on inflamed or bruised areas. Heat often brings more relief post needling. You can continue your regular activities, but vigorous activity is not recommended initially after the treatment for 24 hours. DN is best combined with other physical therapy such as strengthening, stretching, and other therapies.   What are the complications? While your therapist has had extensive training in minimizing the risks of trigger point dry needling, it is important to understand the risks of any procedure.  Risks include bleeding, pain, fatigue, hematoma, infection, vertigo, nausea or nerve involvement. Monitor for any changes to your skin or sensation. Contact your therapist or MD with concerns.  A rare but serious complication is a pneumothorax over or near your middle and upper chest and back If you have dry needling in this area, monitor for the following symptoms: Shortness of breath on exertion and/or Difficulty taking a deep breath and/or Chest Pain and/or A dry cough If any of the above symptoms develop, please go to the nearest emergency room or call 911. Tell them you had dry needling over your thorax and report any symptoms you are having. Please follow-up with your treating therapist after you complete the medical evaluation.   TherEx Trial of tennis ball for suboccipital release x 5 min. Provided tennis balls for patient to continue with this at home.   PATIENT EDUCATION: Education details: TPDN (see above), continue HEP and added to HEP Person educated: Patient Education method: Explanation, Demonstration, and Handouts Education comprehension: verbalized understanding and  needs further education  HOME EXERCISE PROGRAM: Access Code: [email or web address] URL: [email or web address] Date: 11/27/2023 Prepared by: Nickola Baron  Exercises - Seated Upper Trapezius Stretch  - 1 x daily - 7 x weekly - 3 sets - 30s hold - Seated Levator Scapulae Stretch  - 1 x daily - 7 x weekly - 3 sets - 30s hold - Sternocleidomastoid Stretch  - 1 x daily - 7 x weekly - 3 sets - 30s hold - Seated Cervical Flexion Stretch with Finger Support Behind Neck  - 1 x daily - 7 x weekly - 3 sets - 30s hold - Supine Suboccipital Release with Tennis Balls  - 1 x daily - 7 x weekly - 1 sets - 1 reps - 5 min hold   GOALS: Goals reviewed with patient? Yes  SHORT TERM GOALS: = LTG based on PT POC length   LONG TERM GOALS: Target date: 12/29/23  Pt will be independent with final HEP for improved symptom report  Baseline: to be updated  Goal status: INITIAL  2.  Patient will improve HDI score by at least 29 points to reflect a clinically significant improvement in her symptoms Baseline: 40 Goal status: INITIAL  ASSESSMENT:  CLINICAL  IMPRESSION: Emphasis of skilled PT session on initiating TPDN to address HA symptoms as well as muscle pain and tightness. Pt with good response to DN this session with relief of some of her headache following treatment. Also trialed suboccipital release with tennis balls, pt with good response with this as well so added to her HEP. Pt continues to benefit from skilled PT services to work towards increased independence with management of her pain symptoms as well as to assess her vertigo symptoms. Continue POC.   OBJECTIVE IMPAIRMENTS: decreased knowledge of condition, dizziness, increased edema, increased fascial restrictions, increased muscle spasms, postural dysfunction, and pain.   ACTIVITY LIMITATIONS: carrying, lifting, bed mobility, locomotion level, and caring for others  PARTICIPATION LIMITATIONS: interpersonal relationship, shopping, and  community activity  PERSONAL FACTORS: Age, Past/current experiences, Social background, and Time since onset of injury/illness/exacerbation are also affecting patient's functional outcome.   REHAB POTENTIAL: Fair etiology  CLINICAL DECISION MAKING: Stable/uncomplicated  EVALUATION COMPLEXITY: Low  PLAN:  PT FREQUENCY: 1x/week  PT DURATION: 4 weeks  PLANNED INTERVENTIONS: 97164- PT Re-evaluation, 97750- Physical Performance Testing, 97110-Therapeutic exercises, 97530- Therapeutic activity, [id number]- Neuromuscular re-education, 97535- Self Care, 19147- Manual therapy, [id number]- Gait training, [phone]- Canalith repositioning, [id number]- Aquatic Therapy, [phone]- Electrical stimulation (manual), Patient/Family education, Balance training, Stair training, Taping, Dry Needling, Joint mobilization, Spinal mobilization, Vestibular training, Visual/preceptual remediation/compensation, DME instructions, Cryotherapy, and Moist heat  PLAN FOR NEXT SESSION: response to TPDN?, cervical stretching, posture retraining, did she contact PCP re: swelling and rx dose? assess vertigo symptoms   Lorita Rosa, PT Lorita Rosa, PT, DPT, CSRS   12/05/2023, 3:25 PM

## 2023-12-12 ENCOUNTER — Ambulatory Visit: Admitting: Physical Therapy

## 2023-12-14 ENCOUNTER — Ambulatory Visit

## 2023-12-19 ENCOUNTER — Ambulatory Visit: Admitting: Physical Therapy

## 2023-12-26 ENCOUNTER — Ambulatory Visit: Attending: Family Medicine

## 2023-12-26 DIAGNOSIS — R278 Other lack of coordination: Secondary | ICD-10-CM | POA: Diagnosis not present

## 2023-12-26 DIAGNOSIS — M6281 Muscle weakness (generalized): Secondary | ICD-10-CM | POA: Diagnosis not present

## 2023-12-26 DIAGNOSIS — R2681 Unsteadiness on feet: Secondary | ICD-10-CM | POA: Insufficient documentation

## 2023-12-26 DIAGNOSIS — R293 Abnormal posture: Secondary | ICD-10-CM | POA: Diagnosis not present

## 2023-12-26 NOTE — Therapy (Signed)
 OUTPATIENT PHYSICAL THERAPY NEURO TREATMENT/ DISCHARGE SUMMARY   Patient Name: Kimberly Zhang MRN: [id number] DOB:09-16-1965, 58 y.o., female Today's Date: 12/26/2023   PCP: Casimer Clear, FNP REFERRING PROVIDER: Casimer Clear, FNP  PHYSICAL THERAPY DISCHARGE SUMMARY  Visits from Start of Care: 3  Current functional level related to goals / functional outcomes: See below   Remaining deficits: See below    Education / Equipment: PT POC, HEP   Patient agrees to discharge. Patient goals were partially met. Patient is being discharged due to the patient's request.  END OF SESSION:  PT End of Session - 12/26/23 1107     Visit Number 3    Number of Visits 5    Date for PT Re-Evaluation 12/29/23    Authorization Type [employer]    PT Start Time 1104   patient late   PT Stop Time 1119   d/c, patient late and patient requesting to discontinue   PT Time Calculation (min) 15 min              Past Medical History:  Diagnosis Date   Allergy    Anxiety    Asthma    Benign hematuria 2011   Ottelin   Hyperlipidemia    Migraine    Vitamin D  deficiency    Past Surgical History:  Procedure Laterality Date   ABDOMINAL HYSTERECTOMY  1998   COLONOSCOPY  2018   LEEP     POLYPECTOMY     TONSILECTOMY/ADENOIDECTOMY WITH MYRINGOTOMY     Patient Active Problem List   Diagnosis Date Noted   Transient ischemic attack (TIA) 12/10/2021   B12 deficiency 04/21/2021   Obesity (BMI 30.0-34.9) 12/01/2015   Female genuine stress incontinence 10/20/2014   Benign hematuria    Hyperlipidemia    Anxiety    Asthma    Migraine    Vitamin D  deficiency     ONSET DATE: 11/20/23 referral  REFERRING DIAG:  G43.701 (ICD-10-CM) - Chronic migraine without aura with status migrainosus, not intractable  Z86.73 (ICD-10-CM) - History of stroke    THERAPY DIAG:  Muscle weakness (generalized)  Abnormal posture  Other lack of coordination  Unsteadiness on  feet  Rationale for Evaluation and Treatment: Rehabilitation  SUBJECTIVE:                                                                                                                                                                                             SUBJECTIVE STATEMENT: Patient reports doing well. Does still have swelling in L arm, but attributes it to side effect of a medication she's on. Reports consistent "pressure" over L  eye. Agreeable to dc. "I don't know what else we would work on."   Pt accompanied by: self  PERTINENT HISTORY: CVA, anxiety, HLD, migraines, Vit D deficiency   PAIN:  Are you having pain? Yes: NPRS scale: 5/10 Pain location: HA, B crown of head Pain description: nagging Aggravating factors: laying down Relieving factors: pushing on certain pressure points  PRECAUTIONS: Fall   PATIENT GOALS: "show me some way to relieve this"  OBJECTIVE:  Note: Objective measures were completed at Evaluation unless otherwise noted.   EDEMA:  Reports R ankle intermittent swelling  L arm 5cm distal to AC: 29.5cm R arm 5cm distal to AC: 27.5cm   PATIENT SURVEYS:  Headache disability index: 34                                                                                                                              TREATMENT  TherAct -headache disability index: 34   PATIENT EDUCATION: Education details: TPDN (see above), continue HEP and added to HEP Person educated: Patient Education method: Explanation, Demonstration, and Handouts Education comprehension: verbalized understanding and needs further education  HOME EXERCISE PROGRAM: Access Code: [email or web address] URL: [email or web address] Date: 11/27/2023 Prepared by: Nickola Baron  Exercises - Seated Upper Trapezius Stretch  - 1 x daily - 7 x weekly - 3 sets - 30s hold - Seated Levator Scapulae Stretch  - 1 x daily - 7 x weekly - 3 sets - 30s hold - Sternocleidomastoid Stretch  - 1 x  daily - 7 x weekly - 3 sets - 30s hold - Seated Cervical Flexion Stretch with Finger Support Behind Neck  - 1 x daily - 7 x weekly - 3 sets - 30s hold - Supine Suboccipital Release with Tennis Balls  - 1 x daily - 7 x weekly - 1 sets - 1 reps - 5 min hold   GOALS: Goals reviewed with patient? Yes  SHORT TERM GOALS: = LTG based on PT POC length   LONG TERM GOALS: Target date: 12/29/23  Pt will be independent with final HEP for improved symptom report  Baseline: to be updated; updated Goal status: MET  2.  Patient will improve HDI score by at least 29 points to reflect a clinically significant improvement in her symptoms Baseline: 40, 34 Goal status: NOT MET  ASSESSMENT:  CLINICAL IMPRESSION: Patient seen for skilled PT session with emphasis on goal assessment and dc per patient request. She does report improvement in her HA experience, but does continue to have moderate to severe HA per her report. She scored a 34 on the HDI which reflects a fair level of disability related to her headaches. She does not wish to continue PT at this time. Patient to dc.    OBJECTIVE IMPAIRMENTS: decreased knowledge of condition, dizziness, increased edema, increased fascial restrictions, increased muscle spasms, postural dysfunction, and pain.   ACTIVITY LIMITATIONS: carrying, lifting, bed mobility, locomotion level, and caring for others  PARTICIPATION  LIMITATIONS: interpersonal relationship, shopping, and community activity  PERSONAL FACTORS: Age, Past/current experiences, Social background, and Time since onset of injury/illness/exacerbation are also affecting patient's functional outcome.   REHAB POTENTIAL: Fair etiology  CLINICAL DECISION MAKING: Stable/uncomplicated  EVALUATION COMPLEXITY: Low  PLAN:  PT FREQUENCY: 1x/week  PT DURATION: 4 weeks  PLANNED INTERVENTIONS: 97164- PT Re-evaluation, 97750- Physical Performance Testing, 97110-Therapeutic exercises, 97530- Therapeutic activity,  [id number]- Neuromuscular re-education, 97535- Self Care, 16109- Manual therapy, [phone]- Gait training, [phone]- Canalith repositioning, [id number]- Aquatic Therapy, [phone]- Electrical stimulation (manual), Patient/Family education, Balance training, Stair training, Taping, Dry Needling, Joint mobilization, Spinal mobilization, Vestibular training, Visual/preceptual remediation/compensation, DME instructions, Cryotherapy, and Moist heat  PLAN FOR NEXT SESSION: dc from PT  Rebecca Campus, PT Rebecca Campus, PT, DPT, CBIS  12/26/2023, 11:27 AM

## 2024-01-15 ENCOUNTER — Ambulatory Visit: Payer: Self-pay

## 2024-01-15 NOTE — Telephone Encounter (Signed)
 Copied from CRM [id number]. Topic: Clinical - Red Word Triage >> Jan 15, 2024 11:31 AM Drema MATSU wrote: Red Word that prompted transfer to Nurse Triage: Patient has a lump on her forearm and she has a tick bite on the inside of her right upper arm.    Reason for Disposition  Red ring or bull's-eye rash occurs at tick bite  Answer Assessment - Initial Assessment Questions 1. ATTACHED:  Is the tick still on the skin?  (e.g., yes, no, unsure)     No 2. ONSET - TICK STILL ATTACHED:  How long do you think the tick has been on your skin? (e.g., hours, days, unsure)  Note:  Is there a recent activity (camping, hiking) where the caller may have been exposed?     Yesterday 3. ONSET - TICK NOT STILL ATTACHED: If the tick has been removed, how long do you think the tick was attached before you removed it? (e.g., 5 hours, 2 days). When was this?     Yesterday  4. LOCATION: Where is the tick bite located? (e.g., arm, leg)     Right forearm  5. TYPE of TICK: Is it a wood tick or a deer tick? (e.g., deer tick, wood tick; unsure)     Unsure  6. SIZE of TICK: How big is the tick? (e.g., size of poppy seed, apple seed, watermelon seed; unsure) Note: Deer ticks can be the size of a poppy seed (nymph) or an apple seed (adult).       Little brown tick  7. ENGORGED: Did the tick look flat or engorged (full, swollen)? (e.g., flat, engorged; unsure)     No 8. OTHER SYMPTOMS: Do you have any other symptoms? (e.g., fever, rash, redness at bite area, red ring around bite)     Redness, pain, and swelling to area  9. PREGNANCY: Is there any chance you are pregnant? When was your last menstrual period?     No  Protocols used: Tick Bite-A-AH   FYI Only or Action Required?: FYI only for provider.  Patient was last seen in primary care on 08/07/2023 by Alvia Corean CROME, FNP. Called Nurse Triage reporting Tick Removal. Symptoms began yesterday. Symptoms are: gradually worsening.  Triage  Disposition: See Physician Within 24 Hours  Patient/caregiver understands and will follow disposition?: Yes

## 2024-01-16 ENCOUNTER — Ambulatory Visit: Admitting: Family Medicine

## 2024-01-16 ENCOUNTER — Telehealth: Payer: Self-pay

## 2024-01-16 ENCOUNTER — Other Ambulatory Visit: Payer: Self-pay | Admitting: Family Medicine

## 2024-01-16 ENCOUNTER — Encounter: Payer: Self-pay | Admitting: Family Medicine

## 2024-01-16 VITALS — BP 128/80 | HR 66 | Temp 98.1°F | Ht 63.0 in | Wt 175.0 lb

## 2024-01-16 DIAGNOSIS — F419 Anxiety disorder, unspecified: Secondary | ICD-10-CM

## 2024-01-16 DIAGNOSIS — G43811 Other migraine, intractable, with status migrainosus: Secondary | ICD-10-CM

## 2024-01-16 DIAGNOSIS — L0390 Cellulitis, unspecified: Secondary | ICD-10-CM | POA: Insufficient documentation

## 2024-01-16 DIAGNOSIS — Z124 Encounter for screening for malignant neoplasm of cervix: Secondary | ICD-10-CM | POA: Insufficient documentation

## 2024-01-16 DIAGNOSIS — R110 Nausea: Secondary | ICD-10-CM | POA: Insufficient documentation

## 2024-01-16 DIAGNOSIS — M7989 Other specified soft tissue disorders: Secondary | ICD-10-CM | POA: Diagnosis not present

## 2024-01-16 MED ORDER — METHYLPREDNISOLONE ACETATE 40 MG/ML IJ SUSP
40.0000 mg | Freq: Once | INTRAMUSCULAR | Status: AC
Start: 2024-01-16 — End: 2024-01-16
  Administered 2024-01-16: 40 mg via INTRAMUSCULAR

## 2024-01-16 MED ORDER — DOXYCYCLINE HYCLATE 100 MG PO CAPS: 100.0000 mg | ORAL_CAPSULE | Freq: Two times a day (BID) | ORAL | 0 refills | Status: AC

## 2024-01-16 MED ORDER — ONDANSETRON 4 MG PO TBDP: 4.0000 mg | ORAL_TABLET | Freq: Three times a day (TID) | ORAL | 0 refills | Status: DC | PRN

## 2024-01-16 MED ORDER — KETOROLAC TROMETHAMINE 60 MG/2ML IM SOLN
60.0000 mg | Freq: Once | INTRAMUSCULAR | Status: AC
  Administered 2024-01-16: 60 mg via INTRAMUSCULAR

## 2024-01-16 MED ORDER — METHYLPREDNISOLONE ACETATE 40 MG/ML IJ SUSP
40.0000 mg | Freq: Once | INTRAMUSCULAR | Status: AC
  Administered 2024-01-16: 40 mg via INTRAMUSCULAR

## 2024-01-16 NOTE — Patient Instructions (Addendum)
 You have received a steroid injection in the office today.  You have received an injection of Toradol  in office today for pain.  Do not take any NSAIDs including aspirin , Aleve, ibuprofen , Celebrex, meloxicam within the next 6 hours after your injection.  I have sent in a prescription for doxycycline  for you to take twice a day for 7 days. Take this medication with food as it can upset your stomach if you do not.  Someone will be reaching out to get you scheduled for an ultrasound for the lump on your left arm.  We will be in contact with results once they are received.

## 2024-01-16 NOTE — Progress Notes (Signed)
 Acute Office Visit  Subjective:     Patient ID: Kimberly Zhang, female    DOB: Jul 17, 1966, 58 y.o.   MRN: [id number]  Chief Complaint  Patient presents with   Acute Visit    Tick bite, right upper arm. Happened about 2 days ago. Has a lump on Left arm, noticed during PT    HPI Patient is in today for evaluation of tick bite to the right medial upper arm.  States occurred about 2 days ago.  States that the arm is red, tender, warm to touch. Has not attempted OTC treatment, but has removed the tick. Reports that she was in physical therapy, and noted that her left forearm had localized swelling and tenderness.  No erythema, no heat, full range of motion. Denies abdominal pain, nausea, vomiting, diarrhea, rash, fever, other symptoms.  Reports she has a migraine today, attributing this to the heat.  Has not taken migraine medication today.  Requesting injections today to help with migraine.  ROS Per HPI      Objective:    BP 128/80 (BP Location: Left Arm, Patient Position: Sitting)   Pulse 66   Temp 98.1 F (36.7 C) (Temporal)   Ht 5' 3 (1.6 m)   Wt 175 lb (79.4 kg)   SpO2 97%   BMI 31.00 kg/m    Physical Exam Vitals and nursing note reviewed.  Constitutional:      General: She is not in acute distress.    Appearance: Normal appearance. She is obese.  HENT:     Head: Normocephalic and atraumatic.     Right Ear: External ear normal.     Left Ear: External ear normal.     Nose: Nose normal.     Mouth/Throat:     Mouth: Mucous membranes are moist.     Pharynx: Oropharynx is clear.   Eyes:     Extraocular Movements: Extraocular movements intact.     Pupils: Pupils are equal, round, and reactive to light.    Cardiovascular:     Rate and Rhythm: Normal rate and regular rhythm.     Pulses: Normal pulses.     Heart sounds: Normal heart sounds.  Pulmonary:     Effort: Pulmonary effort is normal. No respiratory distress.     Breath sounds: Normal breath  sounds. No wheezing, rhonchi or rales.   Musculoskeletal:        General: Swelling present.     Cervical back: Normal range of motion.     Right lower leg: No edema.     Left lower leg: No edema.     Comments: Area of soft tissue swelling to the L forearm,  mildly tender. No bruising, no lesions noted  Lymphadenopathy:     Cervical: No cervical adenopathy.   Skin:    Findings: Lesion present.     Comments: Area of erythema, swelling, heat with central lesion consistent with insect bite. No discharge, no bleeding from the area.   Neurological:     General: No focal deficit present.     Mental Status: She is alert and oriented to person, place, and time.   Psychiatric:        Mood and Affect: Mood normal.        Thought Content: Thought content normal.     No results found for any visits on 01/16/24.      Assessment & Plan:   Cervical cancer screening -     Ambulatory referral to Obstetrics /  Gynecology  Acute cellulitis -     Doxycycline  Hyclate; Take 1 capsule (100 mg total) by mouth 2 (two) times daily for 7 days.  Dispense: 14 capsule; Refill: 0 -     Ketorolac  Tromethamine  -     methylPREDNISolone  Acetate -     methylPREDNISolone  Acetate  Mass of soft tissue -     VAS US  UPPER EXTREMITY VENOUS DUPLEX; Future  Other migraine with status migrainosus, intractable -     Ondansetron ; Take 1 tablet (4 mg total) by mouth every 8 (eight) hours as needed for nausea or vomiting.  Dispense: 20 tablet; Refill: 0 -     Ketorolac  Tromethamine  -     methylPREDNISolone  Acetate -     methylPREDNISolone  Acetate  Nausea -     Ondansetron ; Take 1 tablet (4 mg total) by mouth every 8 (eight) hours as needed for nausea or vomiting.  Dispense: 20 tablet; Refill: 0     Meds ordered this encounter  Medications   doxycycline  (VIBRAMYCIN ) 100 MG capsule    Sig: Take 1 capsule (100 mg total) by mouth 2 (two) times daily for 7 days.    Dispense:  14 capsule    Refill:  0    ondansetron  (ZOFRAN -ODT) 4 MG disintegrating tablet    Sig: Take 1 tablet (4 mg total) by mouth every 8 (eight) hours as needed for nausea or vomiting.    Dispense:  20 tablet    Refill:  0   ketorolac  (TORADOL ) injection 60 mg   methylPREDNISolone  acetate (DEPO-MEDROL ) injection 40 mg   methylPREDNISolone  acetate (DEPO-MEDROL ) injection 40 mg    Return in about 3 months (around 04/17/2024) for labs, meds.  Corean LITTIE Ku, FNP

## 2024-01-16 NOTE — Telephone Encounter (Signed)
 Copied from CRM [id number]. Topic: General - Other >> Jan 16, 2024  4:33 PM Martinique E wrote: Reason for CRM: Nanetta from [facility] called in stating that she needs clarification on an imaging order that got sent over. Nanetta stated that the order states upper venous ultrasound, but then the note states of left leg. Keisha needing to know if this is for the arm or leg. Callback number [phone].

## 2024-01-17 ENCOUNTER — Ambulatory Visit (HOSPITAL_COMMUNITY)
Admission: RE | Admit: 2024-01-17 | Discharge: 2024-01-17 | Disposition: A | Discharge status: Home / Self Care | Source: Ambulatory Visit | Attending: Family Medicine | Admitting: Family Medicine

## 2024-01-17 DIAGNOSIS — M7989 Other specified soft tissue disorders: Secondary | ICD-10-CM

## 2024-01-18 ENCOUNTER — Ambulatory Visit: Payer: Self-pay | Admitting: Family Medicine

## 2024-01-18 DIAGNOSIS — E042 Nontoxic multinodular goiter: Secondary | ICD-10-CM

## 2024-01-19 NOTE — Telephone Encounter (Signed)
 Imaging completed.

## 2024-01-22 ENCOUNTER — Ambulatory Visit
Admission: RE | Admit: 2024-01-22 | Discharge: 2024-01-22 | Disposition: A | Discharge status: Home / Self Care | Source: Ambulatory Visit | Attending: Family Medicine

## 2024-01-22 DIAGNOSIS — E042 Nontoxic multinodular goiter: Secondary | ICD-10-CM | POA: Diagnosis not present

## 2024-01-25 ENCOUNTER — Ambulatory Visit: Payer: Self-pay | Admitting: Family Medicine

## 2024-01-25 DIAGNOSIS — E042 Nontoxic multinodular goiter: Secondary | ICD-10-CM

## 2024-02-01 ENCOUNTER — Ambulatory Visit
Admission: RE | Admit: 2024-02-01 | Discharge: 2024-02-01 | Disposition: A | Discharge status: Home / Self Care | Source: Ambulatory Visit | Attending: Family Medicine | Admitting: Family Medicine

## 2024-02-01 ENCOUNTER — Other Ambulatory Visit (HOSPITAL_COMMUNITY)
Admission: RE | Admit: 2024-02-01 | Discharge: 2024-02-01 | Disposition: A | Discharge status: Home / Self Care | Source: Ambulatory Visit | Attending: Family Medicine | Admitting: Family Medicine

## 2024-02-01 DIAGNOSIS — E042 Nontoxic multinodular goiter: Secondary | ICD-10-CM | POA: Diagnosis not present

## 2024-02-05 ENCOUNTER — Ambulatory Visit: Payer: Self-pay | Admitting: Family Medicine

## 2024-02-05 LAB — CYTOLOGY - NON PAP

## 2024-03-12 ENCOUNTER — Other Ambulatory Visit: Payer: Self-pay | Admitting: Family Medicine

## 2024-03-12 DIAGNOSIS — E7801 Familial hypercholesterolemia: Secondary | ICD-10-CM

## 2024-03-12 DIAGNOSIS — Z8673 Personal history of transient ischemic attack (TIA), and cerebral infarction without residual deficits: Secondary | ICD-10-CM

## 2024-03-20 ENCOUNTER — Other Ambulatory Visit: Payer: Self-pay | Admitting: Cardiology

## 2024-03-20 DIAGNOSIS — E7801 Familial hypercholesterolemia: Secondary | ICD-10-CM

## 2024-03-20 DIAGNOSIS — Z8673 Personal history of transient ischemic attack (TIA), and cerebral infarction without residual deficits: Secondary | ICD-10-CM

## 2024-04-25 ENCOUNTER — Other Ambulatory Visit (HOSPITAL_COMMUNITY): Payer: Self-pay

## 2024-04-25 ENCOUNTER — Encounter: Payer: Self-pay | Admitting: Family Medicine

## 2024-04-25 ENCOUNTER — Ambulatory Visit (INDEPENDENT_AMBULATORY_CARE_PROVIDER_SITE_OTHER): Admitting: Family Medicine

## 2024-04-25 ENCOUNTER — Telehealth: Payer: Self-pay

## 2024-04-25 VITALS — BP 114/80 | HR 52 | Temp 98.2°F | Ht 63.0 in | Wt 173.2 lb

## 2024-04-25 DIAGNOSIS — J301 Allergic rhinitis due to pollen: Secondary | ICD-10-CM

## 2024-04-25 DIAGNOSIS — Z8673 Personal history of transient ischemic attack (TIA), and cerebral infarction without residual deficits: Secondary | ICD-10-CM

## 2024-04-25 DIAGNOSIS — E78019 Familial hypercholesterolemia, unspecified: Secondary | ICD-10-CM

## 2024-04-25 DIAGNOSIS — G43709 Chronic migraine without aura, not intractable, without status migrainosus: Secondary | ICD-10-CM

## 2024-04-25 DIAGNOSIS — F411 Generalized anxiety disorder: Secondary | ICD-10-CM | POA: Diagnosis not present

## 2024-04-25 DIAGNOSIS — R251 Tremor, unspecified: Secondary | ICD-10-CM

## 2024-04-25 DIAGNOSIS — R2681 Unsteadiness on feet: Secondary | ICD-10-CM | POA: Diagnosis not present

## 2024-04-25 DIAGNOSIS — Z0000 Encounter for general adult medical examination without abnormal findings: Secondary | ICD-10-CM | POA: Diagnosis not present

## 2024-04-25 DIAGNOSIS — G43701 Chronic migraine without aura, not intractable, with status migrainosus: Secondary | ICD-10-CM

## 2024-04-25 DIAGNOSIS — G43809 Other migraine, not intractable, without status migrainosus: Secondary | ICD-10-CM

## 2024-04-25 DIAGNOSIS — E66811 Obesity, class 1: Secondary | ICD-10-CM

## 2024-04-25 DIAGNOSIS — Z6830 Body mass index (BMI) 30.0-30.9, adult: Secondary | ICD-10-CM

## 2024-04-25 DIAGNOSIS — E6609 Other obesity due to excess calories: Secondary | ICD-10-CM

## 2024-04-25 DIAGNOSIS — F419 Anxiety disorder, unspecified: Secondary | ICD-10-CM

## 2024-04-25 DIAGNOSIS — Z23 Encounter for immunization: Secondary | ICD-10-CM | POA: Diagnosis not present

## 2024-04-25 LAB — COMPREHENSIVE METABOLIC PANEL WITH GFR
ALT: 12 U/L (ref 0–35)
AST: 13 U/L (ref 0–37)
Albumin: 4.1 g/dL (ref 3.5–5.2)
Alkaline Phosphatase: 99 U/L (ref 39–117)
BUN: 7 mg/dL (ref 6–23)
CO2: 29 meq/L (ref 19–32)
Calcium: 9.6 mg/dL (ref 8.4–10.5)
Chloride: 105 meq/L (ref 96–112)
Creatinine, Ser: 0.62 mg/dL (ref 0.40–1.20)
GFR: 97.87 mL/min (ref >60.00)
Glucose, Bld: 103 mg/dL — ABNORMAL HIGH (ref 70–99)
Potassium: 4.5 meq/L (ref 3.5–5.1)
Sodium: 142 meq/L (ref 135–145)
Total Bilirubin: 0.5 mg/dL (ref 0.2–1.2)
Total Protein: 6.9 g/dL (ref 6.0–8.3)

## 2024-04-25 LAB — CBC WITH DIFFERENTIAL/PLATELET
Basophils Absolute: 0.1 K/uL (ref 0.0–0.1)
Basophils Relative: 1.1 % (ref 0.0–3.0)
Eosinophils Absolute: 0.1 K/uL (ref 0.0–0.7)
Eosinophils Relative: 1.9 % (ref 0.0–5.0)
HCT: 45.6 % (ref 36.0–46.0)
Hemoglobin: 15.3 g/dL — ABNORMAL HIGH (ref 12.0–15.0)
Lymphocytes Relative: 43.3 % (ref 12.0–46.0)
Lymphs Abs: 2.9 K/uL (ref 0.7–4.0)
MCHC: 33.5 g/dL (ref 30.0–36.0)
MCV: 86.7 fl (ref 78.0–100.0)
Monocytes Absolute: 0.5 K/uL (ref 0.1–1.0)
Monocytes Relative: 7.5 % (ref 3.0–12.0)
Neutro Abs: 3.1 K/uL (ref 1.4–7.7)
Neutrophils Relative %: 46.2 % (ref 43.0–77.0)
Platelets: 270 K/uL (ref 150.0–400.0)
RBC: 5.26 Mil/uL — ABNORMAL HIGH (ref 3.87–5.11)
RDW: 13.1 % (ref 11.5–15.5)
WBC: 6.8 K/uL (ref 4.0–10.5)

## 2024-04-25 LAB — VITAMIN B12: Vitamin B-12: 237 pg/mL (ref 211–911)

## 2024-04-25 LAB — VITAMIN D 25 HYDROXY (VIT D DEFICIENCY, FRACTURES): VITD: 20.61 ng/mL — ABNORMAL LOW (ref 30.00–100.00)

## 2024-04-25 LAB — TSH: TSH: 0.5 u[IU]/mL (ref 0.35–5.50)

## 2024-04-25 LAB — HEMOGLOBIN A1C: Hgb A1c MFr Bld: 5.8 % (ref 4.6–6.5)

## 2024-04-25 LAB — LIPID PANEL
Cholesterol: 303 mg/dL — ABNORMAL HIGH (ref 0–200)
HDL: 39.1 mg/dL (ref >39.00)
LDL Cholesterol: 230 mg/dL — ABNORMAL HIGH (ref 0–99)
NonHDL: 264.38
Total CHOL/HDL Ratio: 8
Triglycerides: 170 mg/dL — ABNORMAL HIGH (ref 0.0–149.0)
VLDL: 34 mg/dL (ref 0.0–40.0)

## 2024-04-25 MED ORDER — COVID-19 MRNA VACC (MODERNA) 50 MCG/0.5ML IM SUSP: 0.5000 mL | Freq: Once | INTRAMUSCULAR | 0 refills | Status: AC

## 2024-04-25 MED ORDER — NEXLIZET 180-10 MG PO TABS: 1.0000 | ORAL_TABLET | Freq: Every day | ORAL | 3 refills | Status: AC

## 2024-04-25 MED ORDER — ESCITALOPRAM OXALATE 10 MG PO TABS: 10.0000 mg | ORAL_TABLET | Freq: Every day | ORAL | 1 refills | Status: AC

## 2024-04-25 MED ORDER — UBRELVY 50 MG PO TABS: 1.0000 | ORAL_TABLET | ORAL | 1 refills | Status: AC | PRN

## 2024-04-25 MED ORDER — LEVOCETIRIZINE DIHYDROCHLORIDE 5 MG PO TABS: ORAL_TABLET | ORAL | 1 refills | Status: AC

## 2024-04-25 MED ORDER — EMGALITY 120 MG/ML ~~LOC~~ SOAJ: 120.0000 mg | SUBCUTANEOUS | 3 refills | Status: AC

## 2024-04-25 MED ORDER — REPATHA SURECLICK 140 MG/ML ~~LOC~~ SOAJ: 140.0000 mg | SUBCUTANEOUS | 1 refills | Status: DC

## 2024-04-25 NOTE — Telephone Encounter (Signed)
 Pharmacy Patient Advocate Encounter   Received notification from Onbase that prior authorization for Repatha  SureClick 140MG /ML auto-injectors  is required/requested.   Insurance verification completed.   The patient is insured through [facility] MEDICAID.   Per test claim: PA required; PA submitted to above mentioned insurance via Latent Key/confirmation #/EOC [id number] Status is pending

## 2024-04-25 NOTE — Progress Notes (Signed)
 Annual Physical Exam  Subjective:     Patient ID: Kimberly Zhang, female    DOB: December 24, 1965, 58 y.o.   MRN: [id number]  No chief complaint on file.   HPI  Discussed the use of AI scribe software for clinical note transcription with the patient, who gave verbal consent to proceed.  History of Present Illness Kimberly Zhang is a 58 year old female who presents for an annual physical exam.  Tremors and headache symptoms - Hand tremors managed by sitting on hands - Tremors associated with migraines - Recent migraine episode lasted one week, impairing daily functioning - Currently out of medications, including Ingality  Gait instability and balance disturbance - Difficulty with balance for approximately one month - Frequently misses steps or trips but does not fall - Unsteadiness particularly after waking from naps - Requires remaining still before moving after naps  Speech and cognitive disturbances - Speech difficulties, especially during headaches - Delayed processing and response time - Recurrence of stuttering - Episodes of disorientation, including difficulty locating her car - Disorientation episodes cause anxiety  Weight and body composition concerns - Concerned about weight fluctuations and changes in body shape, particularly in the abdominal area - Maintains a diet consisting of vegetables, fruits, nuts, berries, and occasional chicken or fish  Tobacco use - Smokes a cigar occasionally     ROS Per HPI  Most recent fall risk assessment:    06/26/2023    1:07 PM  Fall Risk   Falls in the past year? 1  Number falls in past yr: 0  Injury with Fall? 0  Risk for fall due to : History of fall(s)  Follow up Falls evaluation completed    Most recent depression screenings:    07/24/2023    9:17 AM 06/26/2023    1:07 PM  PHQ 2/9 Scores  PHQ - 2 Score 4 0  PHQ- 9 Score 22     Vision:Within last year and Dental: No current dental problems and  Receives regular dental care  Patient Care Team: Alvia Corean CROME, FNP as PCP - General (Family Medicine) Mona Vinie BROCKS, MD as PCP - Cardiology (Cardiology) Ceil Anes, MD (Inactive) as Consulting Physician (Urology)   Outpatient Medications Prior to Visit  Medication Sig   fluticasone  (FLONASE ) 50 MCG/ACT nasal spray Place 2 sprays into both nostrils daily.   ondansetron  (ZOFRAN -ODT) 4 MG disintegrating tablet Take 1 tablet (4 mg total) by mouth every 8 (eight) hours as needed for nausea or vomiting.   [DISCONTINUED] Bempedoic Acid -Ezetimibe  (NEXLIZET ) 180-10 MG TABS Take 1 tablet by mouth daily at 12 noon.   [DISCONTINUED] escitalopram  (LEXAPRO ) 10 MG tablet TAKE 1 TABLET BY MOUTH EVERY DAY   [DISCONTINUED] Evolocumab  (REPATHA  SURECLICK) 140 MG/ML SOAJ INJECT 140 MG INTO THE SKIN EVERY 14 (FOURTEEN) DAYS.   [DISCONTINUED] Galcanezumab -gnlm (EMGALITY ) 120 MG/ML SOAJ Inject 120 mg into the skin every 30 (thirty) days.   [DISCONTINUED] levocetirizine (XYZAL ) 5 MG tablet TAKE 1 TABLET BY MOUTH DAILY FOR ALLERGIES   [DISCONTINUED] Ubrogepant  (UBRELVY ) 50 MG TABS Take 1 tablet (50 mg total) by mouth as needed.   [DISCONTINUED] promethazine  (PHENERGAN ) 6.25 MG/5ML solution Take 5 mLs (6.25 mg total) by mouth every 6 (six) hours as needed (cough). (Patient not taking: Reported on 04/25/2024)   No facility-administered medications prior to visit.       Objective:    BP 114/80 (BP Location: Left Arm, Patient Position: Sitting)   Pulse (!) 52   Temp 98.2  F (36.8 C) (Temporal)   Ht 5' 3 (1.6 m)   Wt 173 lb 3.2 oz (78.6 kg)   SpO2 100%   BMI 30.68 kg/m    Physical Exam Vitals and nursing note reviewed.  Constitutional:      General: She is not in acute distress.    Appearance: Normal appearance.  HENT:     Head: Normocephalic and atraumatic.     Right Ear: External ear normal.     Left Ear: External ear normal.     Nose: Nose normal.     Mouth/Throat:     Mouth: Mucous  membranes are moist.     Pharynx: Oropharynx is clear.  Eyes:     Extraocular Movements: Extraocular movements intact.     Pupils: Pupils are equal, round, and reactive to light.  Cardiovascular:     Rate and Rhythm: Normal rate and regular rhythm.     Pulses: Normal pulses.     Heart sounds: Normal heart sounds.  Pulmonary:     Effort: Pulmonary effort is normal. No respiratory distress.     Breath sounds: Normal breath sounds. No wheezing, rhonchi or rales.  Abdominal:     General: There is no distension.     Palpations: There is no mass.     Tenderness: There is no abdominal tenderness. There is no guarding or rebound.     Hernia: No hernia is present.  Musculoskeletal:        General: Normal range of motion.     Cervical back: Normal range of motion.     Right lower leg: No edema.     Left lower leg: No edema.  Lymphadenopathy:     Cervical: No cervical adenopathy.  Neurological:     General: No focal deficit present.     Mental Status: She is alert and oriented to person, place, and time.     Comments: Fine tremor to R hand  Psychiatric:        Mood and Affect: Mood normal.        Thought Content: Thought content normal.     Results for orders placed or performed in visit on 04/25/24  CBC with Differential/Platelet  Result Value Ref Range   WBC 6.8 4.0 - 10.5 K/uL   RBC 5.26 (H) 3.87 - 5.11 Mil/uL   Hemoglobin 15.3 (H) 12.0 - 15.0 g/dL   HCT 54.3 63.9 - 53.9 %   MCV 86.7 78.0 - 100.0 fl   MCHC 33.5 30.0 - 36.0 g/dL   RDW 86.8 88.4 - 84.4 %   Platelets 270.0 150.0 - 400.0 K/uL   Neutrophils Relative % 46.2 43.0 - 77.0 %   Lymphocytes Relative 43.3 12.0 - 46.0 %   Monocytes Relative 7.5 3.0 - 12.0 %   Eosinophils Relative 1.9 0.0 - 5.0 %   Basophils Relative 1.1 0.0 - 3.0 %   Neutro Abs 3.1 1.4 - 7.7 K/uL   Lymphs Abs 2.9 0.7 - 4.0 K/uL   Monocytes Absolute 0.5 0.1 - 1.0 K/uL   Eosinophils Absolute 0.1 0.0 - 0.7 K/uL   Basophils Absolute 0.1 0.0 - 0.1 K/uL   Comprehensive metabolic panel with GFR  Result Value Ref Range   Sodium 142 135 - 145 mEq/L   Potassium 4.5 3.5 - 5.1 mEq/L   Chloride 105 96 - 112 mEq/L   CO2 29 19 - 32 mEq/L   Glucose, Bld 103 (H) 70 - 99 mg/dL   BUN 7 6 - 23  mg/dL   Creatinine, Ser 9.37 0.40 - 1.20 mg/dL   Total Bilirubin 0.5 0.2 - 1.2 mg/dL   Alkaline Phosphatase 99 39 - 117 U/L   AST 13 0 - 37 U/L   ALT 12 0 - 35 U/L   Total Protein 6.9 6.0 - 8.3 g/dL   Albumin 4.1 3.5 - 5.2 g/dL   GFR 02.12 >39.99 mL/min   Calcium  9.6 8.4 - 10.5 mg/dL  Hemoglobin J8r  Result Value Ref Range   Hgb A1c MFr Bld 5.8 4.6 - 6.5 %  Lipid panel  Result Value Ref Range   Cholesterol 303 (H) 0 - 200 mg/dL   Triglycerides 829.9 (H) 0.0 - 149.0 mg/dL   HDL 60.89 >60.99 mg/dL   VLDL 65.9 0.0 - 59.9 mg/dL   LDL Cholesterol 769 (H) 0 - 99 mg/dL   Total CHOL/HDL Ratio 8    NonHDL 264.38   TSH  Result Value Ref Range   TSH 0.50 0.35 - 5.50 uIU/mL  Vitamin B12  Result Value Ref Range   Vitamin B-12 237 211 - 911 pg/mL  VITAMIN D  25 Hydroxy (Vit-D Deficiency, Fractures)  Result Value Ref Range   VITD 20.61 (L) 30.00 - 100.00 ng/mL    BP Readings from Last 3 Encounters:  04/25/24 114/80  01/16/24 128/80  10/23/23 122/80   Wt Readings from Last 3 Encounters:  04/25/24 173 lb 3.2 oz (78.6 kg)  01/16/24 175 lb (79.4 kg)  10/23/23 172 lb 9.6 oz (78.3 kg)      Last CBC Lab Results  Component Value Date   WBC 6.8 04/25/2024   HGB 15.3 (H) 04/25/2024   HCT 45.6 04/25/2024   MCV 86.7 04/25/2024   MCH 28.9 12/08/2022   RDW 13.1 04/25/2024   PLT 270.0 04/25/2024   Last metabolic panel Lab Results  Component Value Date   GLUCOSE 103 (H) 04/25/2024   NA 142 04/25/2024   K 4.5 04/25/2024   CL 105 04/25/2024   CO2 29 04/25/2024   BUN 7 04/25/2024   CREATININE 0.62 04/25/2024   GFR 97.87 04/25/2024   CALCIUM  9.6 04/25/2024   PROT 6.9 04/25/2024   ALBUMIN 4.1 04/25/2024   BILITOT 0.5 04/25/2024   ALKPHOS 99  04/25/2024   AST 13 04/25/2024   ALT 12 04/25/2024   ANIONGAP 7 12/10/2021   Last lipids Lab Results  Component Value Date   CHOL 303 (H) 04/25/2024   HDL 39.10 04/25/2024   LDLCALC 230 (H) 04/25/2024   TRIG 170.0 (H) 04/25/2024   CHOLHDL 8 04/25/2024   Last hemoglobin A1c Lab Results  Component Value Date   HGBA1C 5.8 04/25/2024   Last thyroid  functions Lab Results  Component Value Date   TSH 0.50 04/25/2024   Last vitamin D  Lab Results  Component Value Date   VD25OH 20.61 (L) 04/25/2024   Last vitamin B12 and Folate Lab Results  Component Value Date   VITAMINB12 237 04/25/2024         Assessment & Plan:   Assessment and Plan Assessment & Plan Adult Wellness Visit Routine visit with well-controlled blood pressure. Discussed diet and weight management. - Administered flu and pneumonia vaccines. - Scheduled tetanus and shingles vaccines separately, at least two weeks apart. - Encouraged continued healthy diet and lifestyle.  Chronic migraine without aura, without status migrainosus, not intractable Migraine exacerbation with neurological symptoms.  Gait instability and tremor Intermittent gait instability and tremors. Possible functional neurological disorder or migraine-related.  Familial hyperlipidemia, unspecified, history of  stroke Followed by cardiology, currently managed with Zetia  and Repatha   Seasonal allergic rhinitis due to pollen Stable with levocetirizine, refilled  GAD Controlled with escitalopram , refilled  Class I obesity Weight management challenges despite healthy diet.  Immunization due - Flu vaccine given today - Pneumonia vaccine given today      Health Maintenance  Topic Date Due   COVID-19 Vaccine (5 - 2025-26 season) 05/14/2024*   Zoster (Shingles) Vaccine (1 of 2) 07/29/2024*   DTaP/Tdap/Td vaccine (3 - Td or Tdap) 04/28/2025*   Hepatitis B Vaccine (1 of 3 - 19+ 3-dose series) 04/28/2025*   Hepatitis C Screening   04/28/2025*   Colon Cancer Screening  01/23/2025   Breast Cancer Screening  08/23/2025   Pap with HPV screening  06/04/2027   Pneumococcal Vaccine for age over 92  Completed   Flu Shot  Completed   HIV Screening  Completed   HPV Vaccine  Aged Out   Meningitis B Vaccine  Aged Out  *Topic was postponed. The date shown is not the original due date.     Discussed health benefits of physical activity, and encouraged her to engage in regular exercise appropriate for her age and condition.  Orders Placed This Encounter  Procedures   Flu vaccine trivalent PF, 6mos and older(Flulaval,Afluria,Fluarix,Fluzone)   Pneumococcal conjugate vaccine 20-valent (Prevnar 20)   CBC with Differential/Platelet    Release to patient:   Immediate [1]   Comprehensive metabolic panel with GFR    Release to patient:   Immediate [1]   Hemoglobin A1c   Lipid panel   TSH   Vitamin B12   VITAMIN D  25 Hydroxy (Vit-D Deficiency, Fractures)     Meds ordered this encounter  Medications   Evolocumab  (REPATHA  SURECLICK) 140 MG/ML SOAJ    Sig: Inject 140 mg into the skin every 14 (fourteen) days.    Dispense:  6 mL    Refill:  1   Galcanezumab -gnlm (EMGALITY ) 120 MG/ML SOAJ    Sig: Inject 120 mg into the skin every 30 (thirty) days.    Dispense:  3 mL    Refill:  3   levocetirizine (XYZAL ) 5 MG tablet    Sig: TAKE 1 TABLET BY MOUTH DAILY FOR ALLERGIES    Dispense:  90 tablet    Refill:  1   escitalopram  (LEXAPRO ) 10 MG tablet    Sig: Take 1 tablet (10 mg total) by mouth daily.    Dispense:  90 tablet    Refill:  1   Bempedoic Acid -Ezetimibe  (NEXLIZET ) 180-10 MG TABS    Sig: Take 1 tablet by mouth daily at 12 noon.    Dispense:  30 tablet    Refill:  3   Ubrogepant  (UBRELVY ) 50 MG TABS    Sig: Take 1 tablet (50 mg total) by mouth as needed.    Dispense:  90 tablet    Refill:  1    **Patient requests 90 days supply**   COVID-19 mRNA vaccine, Moderna, >/= 62yrs, (SPIKEVAX) injection    Sig: Inject 0.5  mLs into the muscle once for 1 dose.    Dispense:  0.5 mL    Refill:  0    May substitute brand per patient preference/availability    Return in about 6 months (around 10/24/2024).  Corean LITTIE Ku, FNP

## 2024-04-25 NOTE — Patient Instructions (Addendum)
 Completed your physical today.   We are checking labs today, will be in contact with any results that require further attention  Follow-up with me in 6 mos for medication management, sooner if needed.   Cammie Korene Bush, NP   [facility] [address]  [place], [place] [address]   Phone: tel:[phone]   fax:[phone]    We have given your flu vaccine today.   We have given your pneumonia vaccine.   Would get shingles vaccine at least 2 weeks from now.

## 2024-04-26 NOTE — Telephone Encounter (Signed)
 Pharmacy Patient Advocate Encounter  Received notification from [facility] MEDICAID that Prior Authorization for Repatha  SureClick 140mg /ml has been DENIED.  Full denial letter will be uploaded to the media tab. See denial reason below.   PA #/Case ID/Reference #: [id number]

## 2024-04-28 ENCOUNTER — Ambulatory Visit: Payer: Self-pay | Admitting: Family Medicine

## 2024-04-28 DIAGNOSIS — R251 Tremor, unspecified: Secondary | ICD-10-CM | POA: Insufficient documentation

## 2024-04-28 DIAGNOSIS — E6609 Other obesity due to excess calories: Secondary | ICD-10-CM | POA: Insufficient documentation

## 2024-04-28 DIAGNOSIS — Z8673 Personal history of transient ischemic attack (TIA), and cerebral infarction without residual deficits: Secondary | ICD-10-CM | POA: Insufficient documentation

## 2024-04-28 DIAGNOSIS — J301 Allergic rhinitis due to pollen: Secondary | ICD-10-CM | POA: Insufficient documentation

## 2024-04-28 DIAGNOSIS — Z23 Encounter for immunization: Secondary | ICD-10-CM | POA: Insufficient documentation

## 2024-04-28 DIAGNOSIS — R2681 Unsteadiness on feet: Secondary | ICD-10-CM | POA: Insufficient documentation

## 2024-04-28 DIAGNOSIS — E559 Vitamin D deficiency, unspecified: Secondary | ICD-10-CM

## 2024-04-28 DIAGNOSIS — F411 Generalized anxiety disorder: Secondary | ICD-10-CM | POA: Insufficient documentation

## 2024-04-28 DIAGNOSIS — Z0000 Encounter for general adult medical examination without abnormal findings: Secondary | ICD-10-CM | POA: Insufficient documentation

## 2024-04-29 ENCOUNTER — Telehealth: Payer: Self-pay | Admitting: Pharmacy Technician

## 2024-04-29 ENCOUNTER — Other Ambulatory Visit (HOSPITAL_COMMUNITY): Payer: Self-pay

## 2024-04-29 NOTE — Telephone Encounter (Signed)
   Pharmacy Patient Advocate Encounter   Received notification from CoverMyMeds that prior authorization for NEXLIZET  is required/requested.   Insurance verification completed.   The patient is insured through [facility] MEDICAID.   Per test claim: PA required; PA submitted to above mentioned insurance via Latent Key/confirmation #/EOC [facility] Status is pending

## 2024-04-30 ENCOUNTER — Other Ambulatory Visit: Payer: Self-pay | Admitting: Family Medicine

## 2024-04-30 DIAGNOSIS — R0981 Nasal congestion: Secondary | ICD-10-CM

## 2024-04-30 MED ORDER — VITAMIN D (ERGOCALCIFEROL) 1.25 MG (50000 UNIT) PO CAPS: 50000.0000 [IU] | ORAL_CAPSULE | ORAL | 0 refills | Status: AC

## 2024-04-30 NOTE — Telephone Encounter (Signed)
 Pharmacy Patient Advocate Encounter  Received notification from [facility] MEDICAID that Prior Authorization for nexlizet  has been DENIED.  Full denial letter will be uploaded to the media tab. See denial reason below.

## 2024-05-01 ENCOUNTER — Other Ambulatory Visit (HOSPITAL_COMMUNITY): Payer: Self-pay

## 2024-05-01 NOTE — Telephone Encounter (Addendum)
 Per separate encounter patient was approved till 09/18/23 for Repatha . I'm not seeing any submission notes from our team since then.

## 2024-05-01 NOTE — Telephone Encounter (Addendum)
 Pharmacy Patient Advocate Encounter  Appears there was some confusion on which clinic was managing this request due to the last RX being prescribed by patient's primary care physician. When primary care writes the prescription, this causes our primary care RX team to assume they should be completing the Repatha  PA, rather than forwarding the request to the Cardiology team. Sorry for any confusion regarding this PA request. Our team is working to [employer] PA now.   Received notification from Latent that prior authorization for REPATHA  is required/requested.   Insurance verification completed.   The patient is insured through [facility] MEDICAID.   Per test claim: PA required; PA started via CoverMyMeds. KEY [id number] . Please see clinical question(s) below that I am not finding the answer to in their chart and advise.  Last request was denied due to not showing improvement on Repatha . Per labs PCP did on 04/25/24 that were sent on PA submission, LDL C was 230. Please advise.

## 2024-05-01 NOTE — Telephone Encounter (Signed)
 Mona Vinie BROCKS, MD to Rx Prior Auth Team  Ashari Llewellyn M, RN (Selected Message)   05/01/24  8:11 AM Can we re-submit for Repatha ? - according to my note, she was on Repatha  and her on treatment labs were substantially improved - her denial letter said they just needed evidence of this improvement on treatment to re-authorize.   Dr. Mona

## 2024-05-01 NOTE — Telephone Encounter (Signed)
 Oh, I see. My apologies. I just didn't see any recent encounters from our team where we had submitted a PA for the Repatha . Let me investigate our requests in Latent to see if I can determine what was sent on the recent submission.

## 2024-05-02 ENCOUNTER — Other Ambulatory Visit (HOSPITAL_COMMUNITY): Payer: Self-pay

## 2024-05-02 ENCOUNTER — Other Ambulatory Visit: Payer: Self-pay | Admitting: Family Medicine

## 2024-05-02 ENCOUNTER — Telehealth: Payer: Self-pay

## 2024-05-02 DIAGNOSIS — Z8673 Personal history of transient ischemic attack (TIA), and cerebral infarction without residual deficits: Secondary | ICD-10-CM

## 2024-05-02 DIAGNOSIS — E78019 Familial hypercholesterolemia, unspecified: Secondary | ICD-10-CM

## 2024-05-02 NOTE — Telephone Encounter (Signed)
 Pharmacy Patient Advocate Encounter  Received notification from [facility] MEDICAID that Prior Authorization for Emgality  120MG /ML auto-injectors (migraine)   has been APPROVED from 05/02/24 to 05/02/25   PA #/Case ID/Reference #: [id number]

## 2024-05-02 NOTE — Telephone Encounter (Signed)
 Pharmacy Patient Advocate Encounter   Received notification from CoverMyMeds that prior authorization for Emgality  120MG /ML auto-injectors (migraine)  is required/requested.   Insurance verification completed.   The patient is insured through [facility] MEDICAID.   Per test claim: PA required; PA submitted to above mentioned insurance via Latent Key/confirmation #/EOC [id number] Status is pending

## 2024-05-07 NOTE — Telephone Encounter (Signed)
 Last request was denied due to not showing improvement on Repatha . Per labs PCP did on 04/25/24 that were sent on PA submission, LDL C was 230. Please advise.

## 2024-05-08 NOTE — Progress Notes (Signed)
 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 Maxcy 03/04/2022.  Strong family history of high cholesterol in her mom and sister.  Also had coronary artery disease in her son who has high cholesterol.  She has a history of prior stroke and therefore target LDL is less than 70.  She could not tolerate atorvastatin or rosuvastatin  both of which caused severe nausea and GI discomfort.  Lipid panel May 2023 showed total cholesterol 386, triglycerides 1 9, HDL 40 and LDL 309 she reported diet low in saturated fats.  She was started on Repatha  140 mg 2 weeks.  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  She reported some short-term memory issues related to TIAs which is improving somewhat.  She was advised that E4 allele is associated with potential increased risk of Alzheimer's dementia that runs in her family.  At follow-up visit with Dr. Maxcy 10/23/2023 lipid results from December revealed total cholesterol 365, triglycerides 184, HDL 41, and LDL 287.  This was prior to starting Repatha .  LDL improved to 121 on Repatha .  Repeat lipid panel revealed LDL 174.  She was advised to add Nexlizet  180-10 mg daily in addition to Repatha .  Lipid panel 04/25/2024 revealed total cholesterol 303, triglycerides 170, HDL 39.10, and LDL 230.    History of Present Illness Discussed the use of AI scribe software for clinical note transcription with the  patient, who gave verbal consent to proceed.  History of Present Illness Kimberly Zhang is a very pleasant 58 year old female who presents for follow-up of dyslipidemia.  She reports approximately a 3 month history of fatigue, feeling sluggish and dragging, with a significant drop in energy by early afternoon, necessitating a nap. Her sleep schedule is consistent, with occasional early waking for reading or meditation. There is no specific activity or trigger associated with her fatigue.  She denies chest pain, dyspnea, palpitations, or other symptoms concerning for angina. Her LDL levels improved to 121 mg/dL in March with Repatha , but she has not taken it for several months due to a denial issue. Previously, her LDL levels were as high as 287 mg/dL. She felt better when her cholesterol was controlled with Repatha . She is active with walking, household chores and church activities.  She reports her diet is healthy.   ROS: See HPI  Studies Reviewed      Lipoprotein (a)  Date/Time Value Ref Range Status  05/31/2022 09:09 AM 401.4 (H) <75.0 nmol/L Final    Comment:    **Results verified by repeat testing** Note:  Values greater than or equal to 75.0 nmol/L may        indicate an independent risk factor for CHD,        but must be evaluated with caution when applied        to non-Caucasian populations due to the        influence of genetic factors on Lp(a) across        ethnicities.  Risk Assessment/Calculations           Physical Exam VS:  BP 120/64 (BP Location: Left Arm, Patient Position: Sitting, Cuff Size: Normal)   Pulse 66   Ht 5' 3 (1.6 m)   Wt 174 lb 4.8 oz (79.1 kg)   SpO2 97%   BMI 30.88 kg/m    Wt Readings from Last 3 Encounters:  05/15/24 174 lb 4.8 oz (79.1 kg)  04/25/24 173 lb 3.2 oz (78.6 kg)  01/16/24 175 lb (79.4 kg)    GEN: Well nourished, well developed in no acute distress NECK: No JVD; No carotid bruits CARDIAC: RRR, no murmurs, rubs,  gallops RESPIRATORY:  Clear to auscultation without rales, wheezing or rhonchi  ABDOMEN: Soft, non-tender, non-distended EXTREMITIES:  No edema; No deformity    Assessment & Plan Familial hypercholesterolemia   Elevated LP(a) Cardiac risk Statin intolerance History of stroke Lipid panel completed 04/25/2024 with total cholesterol 303, triglycerides 170, HDL 39, LDL 230. Statins cause GI distress. History of significantly elevated LP(a).   She has been off Repatha  for several months due to insurance denial.  There was documentation that she failed to show proof of improvement in LDL on Repatha , however there are multiple lipid panels that show improvement including 05/2022 with LDL of 120 (down from LDL of 309 5/20230 and LDL of 174 on 10/23/2023, down from 287 on 07/24/2023.  We are working on the appeal. Due to potential risk of coronary artery disease from prolonged high cholesterol exposure, recommend CT calcium  score. She denies chest pain, dyspnea, or other symptoms concerning for angina.  No indication for further ischemic evaluation at this time.  - Resubmit prior authorization for Repatha  with evidence of previous improvement - Order CT calcium  score for further risk stratification  - Consider alternative lipid lowering therapy if Repatha  is not approved - Recheck lipids in 3 months for surveillance - Continue good blood pressure control - Maintain a healthy lifestyle, including heart healthy diet along with a generally physically active lifestyle along with at least 150 minutes of moderate intensity exercise each week - Encouraged adding weightlifting exercises for 20-30 minutes, three times a week  Fatigue   Reports feeling sluggish for the past 3 months.  Admits she felt better while taking Repatha .  TSH was normal and no evidence of anemia on labs completed 04/25/2024.   - CT calcium  score to evaluate for possible CAD as noted above - Continue healthy lifestyle including heart  healthy diet and regular physical activity        Dispo: TBD based on lipid results  Signed, Rosaline Bane, NP-C

## 2024-05-09 NOTE — Telephone Encounter (Signed)
 LDL previously 287 and now 230.   Patient has appointment 05/15/24 with EMERSON Bane NP Will CC her

## 2024-05-13 ENCOUNTER — Encounter (HOSPITAL_BASED_OUTPATIENT_CLINIC_OR_DEPARTMENT_OTHER): Payer: Self-pay

## 2024-05-15 ENCOUNTER — Ambulatory Visit (INDEPENDENT_AMBULATORY_CARE_PROVIDER_SITE_OTHER): Admitting: Nurse Practitioner

## 2024-05-15 ENCOUNTER — Encounter (HOSPITAL_BASED_OUTPATIENT_CLINIC_OR_DEPARTMENT_OTHER): Payer: Self-pay | Admitting: Nurse Practitioner

## 2024-05-15 VITALS — BP 120/64 | HR 66 | Ht 63.0 in | Wt 174.3 lb

## 2024-05-15 DIAGNOSIS — Z8673 Personal history of transient ischemic attack (TIA), and cerebral infarction without residual deficits: Secondary | ICD-10-CM | POA: Diagnosis not present

## 2024-05-15 DIAGNOSIS — E78019 Familial hypercholesterolemia, unspecified: Secondary | ICD-10-CM | POA: Diagnosis not present

## 2024-05-15 DIAGNOSIS — R5383 Other fatigue: Secondary | ICD-10-CM

## 2024-05-15 DIAGNOSIS — Z789 Other specified health status: Secondary | ICD-10-CM

## 2024-05-15 DIAGNOSIS — Z7189 Other specified counseling: Secondary | ICD-10-CM

## 2024-05-15 DIAGNOSIS — E785 Hyperlipidemia, unspecified: Secondary | ICD-10-CM

## 2024-05-15 DIAGNOSIS — E7841 Elevated Lipoprotein(a): Secondary | ICD-10-CM

## 2024-05-15 NOTE — Patient Instructions (Signed)
 Medication Instructions:  No changes *If you need a refill on your cardiac medications before your next appointment, please call your pharmacy*  Lab Work: none If you have labs (blood work) drawn today and your tests are completely normal, you will receive your results only by: MyChart Message (if you have MyChart) OR A paper copy in the mail If you have any lab test that is abnormal or we need to change your treatment, we will call you to review the results.  Testing/Procedures: $99 calcium  score CT scan  Follow-Up: To be determined

## 2024-05-15 NOTE — Telephone Encounter (Addendum)
 Hi, can you guys help with this appeal? The insurance was sent the most recent labs of 230 but the provider is hoping we can appeal this with labs from before that date. LDL on 3/31 was 174, down from 287 on 07/24/23. The patient labs before starting repatha  was 309 in 11/2021. I can call the patient and see about her signing the denial and I can send in with the appeal you guys write. It does look like she hasn't gotten repatha  since 02/13/24. Also currently the prescription is written under the primary doctor, so I don't know if the cardiology office needs to write the prescription as well? Rosaline said she could send in the prescription

## 2024-05-16 NOTE — Telephone Encounter (Signed)
 Emailed patient the denial letter to have her sign  I spoke to the patient and she will sign the denial and get back to us 

## 2024-05-23 ENCOUNTER — Telehealth: Payer: Self-pay | Admitting: Pharmacy Technician

## 2024-05-23 NOTE — Telephone Encounter (Signed)
 Sent patient a message to follow up on getting the denial signed for appeals for repatha 

## 2024-05-23 NOTE — Telephone Encounter (Signed)
   See other encounter from 04/29/24 that says nexlizet  but also talks about repatha

## 2024-05-29 ENCOUNTER — Ambulatory Visit (HOSPITAL_BASED_OUTPATIENT_CLINIC_OR_DEPARTMENT_OTHER)
Admission: RE | Admit: 2024-05-29 | Discharge: 2024-05-29 | Disposition: A | Discharge status: Home / Self Care | Payer: Self-pay | Source: Ambulatory Visit | Attending: Nurse Practitioner | Admitting: Nurse Practitioner

## 2024-05-29 DIAGNOSIS — Z7189 Other specified counseling: Secondary | ICD-10-CM

## 2024-05-30 ENCOUNTER — Ambulatory Visit: Payer: Self-pay | Admitting: Nurse Practitioner

## 2024-05-30 NOTE — Telephone Encounter (Signed)
 Lmom for patient to call back since we still havent received her signed denial for us  to send for appeal

## 2024-05-30 NOTE — Telephone Encounter (Signed)
 Hi, this patient is coming up there to [facility] tomorrow to sign the denial of repatha  so Kimberly Zhang can appeal. She said she will be there around 10am. The repatha  denial is under media on 04/26/24. Thank you!

## 2024-05-31 NOTE — Telephone Encounter (Signed)
Appeals faxed 

## 2024-05-31 NOTE — Telephone Encounter (Signed)
 Forms have been printed for patient to sign. Forms given to [facility].

## 2024-06-03 NOTE — Telephone Encounter (Signed)
 Received this from insurance:    Its scanned in media as well

## 2024-06-03 NOTE — Telephone Encounter (Signed)
 More information faxed over

## 2024-06-12 NOTE — Telephone Encounter (Signed)
 I submitted more info on the 10th. Medicaid is not usually that quick, but I can ask the team to follow up

## 2024-06-13 ENCOUNTER — Other Ambulatory Visit (HOSPITAL_COMMUNITY): Payer: Self-pay

## 2024-06-14 NOTE — Telephone Encounter (Signed)
 Medicaid called and said they have everything and now due date is 06/29/24   They said they will let us  know if they do end up needing something

## 2024-06-21 ENCOUNTER — Other Ambulatory Visit (HOSPITAL_COMMUNITY): Payer: Self-pay

## 2024-06-21 NOTE — Telephone Encounter (Signed)
   Insurance is now terminated Praluent does need a pa too on new ins  Pharmacy Patient Advocate Encounter   Received notification from [employer] Messages that prior authorization for PRALUENT is required/requested.   Insurance verification completed.   The patient is insured through [employer].   Per test claim: PA required; PA submitted to above mentioned insurance via Latent Key/confirmation #/EOC [facility] Status is pending

## 2024-06-22 ENCOUNTER — Other Ambulatory Visit: Payer: Self-pay | Admitting: Family Medicine

## 2024-06-22 DIAGNOSIS — E559 Vitamin D deficiency, unspecified: Secondary | ICD-10-CM

## 2024-06-26 ENCOUNTER — Other Ambulatory Visit (HOSPITAL_COMMUNITY): Payer: Self-pay

## 2024-06-27 ENCOUNTER — Telehealth: Payer: Self-pay | Admitting: Pharmacy Technician

## 2024-06-27 ENCOUNTER — Other Ambulatory Visit (HOSPITAL_COMMUNITY): Payer: Self-pay

## 2024-06-27 NOTE — Telephone Encounter (Addendum)
 Patient Advocate Encounter   The patient was approved for a [employer] grant that will help cover the cost of praluent/repatha  Total amount awarded, 2500.  Effective: 05/28/24 - 05/27/25   APW:[id number] ERW:[id number] Hmnle:[id number] PI:[id number] Healthwell ID: [id number]   Pharmacy provided with approval and processing information.

## 2024-06-27 NOTE — Telephone Encounter (Signed)
   [employer]well grant pending

## 2024-06-27 NOTE — Telephone Encounter (Signed)
 Patient is approved under the [facility] plan. [employer]well grant has been applied. She can remain on Repatha .

## 2024-06-27 NOTE — Telephone Encounter (Signed)
 Pharmacy Patient Advocate Encounter  Received notification from [facility] that Prior Authorization for repatha  has been APPROVED from 06/27/24 to 12/26/24   PA #/Case ID/Reference #: [id number]      [facility] and the healthwell grant information and this is now free.

## 2024-06-27 NOTE — Telephone Encounter (Signed)
 Repatha  still rejecting. Tried praluent to see if that goes since it is preferred-but it said the repatha  had to be decided on first. Repatha  pa is still pending.   Pharmacy Patient Advocate Encounter   Received notification from [facility] [employer] Messages that prior authorization for praluent is required/requested.   Insurance verification completed.   The patient is insured through [employer].   Per cmm [id number]

## 2024-06-27 NOTE — Telephone Encounter (Signed)
 Patient Advocate Encounter   The patient was approved for a [employer]well grant that will help cover the cost of praluent Total amount awarded, 2500.  Effective: 05/28/24 - 05/27/25   APW:[id number] ERW:EKKEIFP Hmnle:[id number] PI:[id number] [employer]well ID: [id number]   Pharmacy provided with approval and processing information.     Gave grant info to [employer]

## 2024-06-27 NOTE — Telephone Encounter (Signed)
 Now [facility] has approved praluent   Pharmacy Patient Advocate Encounter  Received notification from [facility] that Prior Authorization for praluent has been APPROVED from 06/27/24 to 12/24/24. Ran test claim, Copay is $484.02. This test claim was processed through [facility]- copay amounts may vary at other pharmacies due to pharmacy/plan contracts, or as the patient moves through the different stages of their insurance plan.   PA #/Case ID/Reference #: [id number]    I applied for a healthwell grant. Status pending.

## 2024-06-27 NOTE — Telephone Encounter (Signed)
 Called pt to let pt know [employer]well grant was approved for Praluent when received a secure chat from pharmacy team stating do not call pt insurance is very confusing. Stated will call pt back when the pharmacy team gets clarification. Will route to Pharm-D.

## 2024-07-01 ENCOUNTER — Telehealth (HOSPITAL_BASED_OUTPATIENT_CLINIC_OR_DEPARTMENT_OTHER): Payer: Self-pay | Admitting: *Deleted

## 2024-07-01 DIAGNOSIS — E78019 Familial hypercholesterolemia, unspecified: Secondary | ICD-10-CM

## 2024-07-01 DIAGNOSIS — Z8673 Personal history of transient ischemic attack (TIA), and cerebral infarction without residual deficits: Secondary | ICD-10-CM

## 2024-07-01 MED ORDER — REPATHA SURECLICK 140 MG/ML ~~LOC~~ SOAJ: 140.0000 mg | SUBCUTANEOUS | 1 refills | Status: AC

## 2024-07-01 NOTE — Telephone Encounter (Signed)
 T/w pt to go over recommendations from Melissa Maccia, Pharm D and [facility] Added on comments the [facility]ist [facility] and pt received a [employer] Well [place].

## 2024-08-02 ENCOUNTER — Other Ambulatory Visit: Payer: Self-pay | Admitting: Family Medicine

## 2024-08-02 DIAGNOSIS — Z1231 Encounter for screening mammogram for malignant neoplasm of breast: Secondary | ICD-10-CM

## 2024-08-08 ENCOUNTER — Other Ambulatory Visit: Payer: Self-pay | Admitting: Family Medicine

## 2024-08-08 DIAGNOSIS — E559 Vitamin D deficiency, unspecified: Secondary | ICD-10-CM

## 2024-08-26 ENCOUNTER — Telehealth: Payer: Self-pay

## 2024-08-26 ENCOUNTER — Other Ambulatory Visit (HOSPITAL_COMMUNITY): Payer: Self-pay

## 2024-08-26 NOTE — Telephone Encounter (Signed)
 Pharmacy Patient Advocate Encounter   Received notification from [facility] that prior authorization for Ubrelvy  50 mg is required/requested.   Insurance verification completed.   The patient is insured through [facility].   Per test claim: PA required; PA submitted to above mentioned insurance via Latent Key/confirmation #/[id number] Status is pending

## 2024-08-29 ENCOUNTER — Ambulatory Visit
Admission: RE | Admit: 2024-08-29 | Discharge: 2024-08-29 | Disposition: A | Source: Ambulatory Visit | Attending: Family Medicine

## 2024-08-29 DIAGNOSIS — Z1231 Encounter for screening mammogram for malignant neoplasm of breast: Secondary | ICD-10-CM

## 2024-10-24 ENCOUNTER — Ambulatory Visit: Admitting: Family Medicine
